# Patient Record
Sex: Female | Born: 1970 | Race: Black or African American | Hispanic: No | Marital: Single | Smoking: Never smoker
Health system: Southern US, Community
[De-identification: ages and names within clinical notes are randomized; demographics above are authoritative.]

## PROBLEM LIST (undated history)

## (undated) DIAGNOSIS — G5610 Other lesions of median nerve, unspecified upper limb: Secondary | ICD-10-CM

## (undated) DIAGNOSIS — I10 Essential (primary) hypertension: Secondary | ICD-10-CM

## (undated) DIAGNOSIS — K08109 Complete loss of teeth, unspecified cause, unspecified class: Secondary | ICD-10-CM

## (undated) DIAGNOSIS — Z89612 Acquired absence of left leg above knee: Secondary | ICD-10-CM

## (undated) DIAGNOSIS — K219 Gastro-esophageal reflux disease without esophagitis: Secondary | ICD-10-CM

## (undated) DIAGNOSIS — N186 End stage renal disease: Secondary | ICD-10-CM

## (undated) DIAGNOSIS — Z7409 Other reduced mobility: Secondary | ICD-10-CM

## (undated) DIAGNOSIS — K000 Anodontia: Secondary | ICD-10-CM

## (undated) DIAGNOSIS — E7800 Pure hypercholesterolemia, unspecified: Secondary | ICD-10-CM

## (undated) DIAGNOSIS — Z872 Personal history of diseases of the skin and subcutaneous tissue: Secondary | ICD-10-CM

## (undated) DIAGNOSIS — I2510 Atherosclerotic heart disease of native coronary artery without angina pectoris: Secondary | ICD-10-CM

## (undated) DIAGNOSIS — Z8679 Personal history of other diseases of the circulatory system: Secondary | ICD-10-CM

## (undated) DIAGNOSIS — Z8614 Personal history of Methicillin resistant Staphylococcus aureus infection: Secondary | ICD-10-CM

## (undated) DIAGNOSIS — Z89611 Acquired absence of right leg above knee: Secondary | ICD-10-CM

## (undated) DIAGNOSIS — I219 Acute myocardial infarction, unspecified: Secondary | ICD-10-CM

## (undated) HISTORY — PX: THROMBECTOMY / ARTERIOVENOUS GRAFT REVISION: SUR1351

## (undated) HISTORY — PX: CENTRAL VENOUS CATHETER INSERTION: SHX401

---

## 1997-10-10 ENCOUNTER — Inpatient Hospital Stay (HOSPITAL_COMMUNITY): Admission: AD | Admit: 1997-10-10 | Discharge: 1997-10-10 | Payer: Self-pay | Admitting: Obstetrics

## 1997-12-12 ENCOUNTER — Encounter: Admission: RE | Admit: 1997-12-12 | Discharge: 1998-03-12 | Payer: Self-pay | Admitting: Obstetrics & Gynecology

## 1997-12-12 ENCOUNTER — Ambulatory Visit (HOSPITAL_COMMUNITY): Admission: RE | Admit: 1997-12-12 | Discharge: 1997-12-12 | Payer: Self-pay

## 1998-01-06 ENCOUNTER — Inpatient Hospital Stay (HOSPITAL_COMMUNITY): Admission: AD | Admit: 1998-01-06 | Discharge: 1998-01-06 | Payer: Self-pay | Admitting: Obstetrics

## 1998-01-07 ENCOUNTER — Encounter (HOSPITAL_COMMUNITY): Admission: RE | Admit: 1998-01-07 | Discharge: 1998-02-11 | Payer: Self-pay | Admitting: Obstetrics & Gynecology

## 1998-01-14 ENCOUNTER — Ambulatory Visit (HOSPITAL_COMMUNITY): Admission: RE | Admit: 1998-01-14 | Discharge: 1998-01-14 | Payer: Self-pay | Admitting: Obstetrics

## 1998-01-19 ENCOUNTER — Inpatient Hospital Stay (HOSPITAL_COMMUNITY): Admission: AD | Admit: 1998-01-19 | Discharge: 1998-01-21 | Payer: Self-pay | Admitting: Obstetrics

## 1998-02-08 ENCOUNTER — Inpatient Hospital Stay (HOSPITAL_COMMUNITY): Admission: AD | Admit: 1998-02-08 | Discharge: 1998-02-11 | Payer: Self-pay | Admitting: Obstetrics & Gynecology

## 1998-10-03 ENCOUNTER — Emergency Department (HOSPITAL_COMMUNITY): Admission: EM | Admit: 1998-10-03 | Discharge: 1998-10-03 | Payer: Self-pay | Admitting: Emergency Medicine

## 1998-10-07 ENCOUNTER — Emergency Department (HOSPITAL_COMMUNITY): Admission: EM | Admit: 1998-10-07 | Discharge: 1998-10-07 | Payer: Self-pay | Admitting: Emergency Medicine

## 1998-10-09 ENCOUNTER — Inpatient Hospital Stay (HOSPITAL_COMMUNITY): Admission: AD | Admit: 1998-10-09 | Discharge: 1998-10-18 | Payer: Self-pay | Admitting: Family Medicine

## 1998-11-14 ENCOUNTER — Inpatient Hospital Stay (HOSPITAL_COMMUNITY): Admission: RE | Admit: 1998-11-14 | Discharge: 1998-11-15 | Payer: Self-pay | Admitting: Orthopedic Surgery

## 1998-11-16 ENCOUNTER — Emergency Department (HOSPITAL_COMMUNITY): Admission: EM | Admit: 1998-11-16 | Discharge: 1998-11-16 | Payer: Self-pay | Admitting: Emergency Medicine

## 1999-10-02 ENCOUNTER — Emergency Department (HOSPITAL_COMMUNITY): Admission: EM | Admit: 1999-10-02 | Discharge: 1999-10-02 | Payer: Self-pay | Admitting: Emergency Medicine

## 1999-10-02 ENCOUNTER — Encounter: Payer: Self-pay | Admitting: Emergency Medicine

## 2001-05-01 ENCOUNTER — Emergency Department (HOSPITAL_COMMUNITY): Admission: EM | Admit: 2001-05-01 | Discharge: 2001-05-01 | Payer: Self-pay | Admitting: Emergency Medicine

## 2002-07-11 ENCOUNTER — Emergency Department (HOSPITAL_COMMUNITY): Admission: EM | Admit: 2002-07-11 | Discharge: 2002-07-11 | Payer: Self-pay | Admitting: Emergency Medicine

## 2002-07-11 ENCOUNTER — Encounter: Payer: Self-pay | Admitting: Emergency Medicine

## 2002-07-13 ENCOUNTER — Ambulatory Visit (HOSPITAL_BASED_OUTPATIENT_CLINIC_OR_DEPARTMENT_OTHER): Admission: RE | Admit: 2002-07-13 | Discharge: 2002-07-13 | Payer: Self-pay | Admitting: Orthopedic Surgery

## 2002-07-13 HISTORY — PX: FINGER EXPLORATION: SHX1635

## 2003-10-16 ENCOUNTER — Emergency Department (HOSPITAL_COMMUNITY): Admission: EM | Admit: 2003-10-16 | Discharge: 2003-10-17 | Payer: Self-pay | Admitting: Emergency Medicine

## 2004-07-15 ENCOUNTER — Inpatient Hospital Stay (HOSPITAL_COMMUNITY): Admission: EM | Admit: 2004-07-15 | Discharge: 2004-07-19 | Payer: Self-pay | Admitting: Emergency Medicine

## 2004-07-17 ENCOUNTER — Encounter (INDEPENDENT_AMBULATORY_CARE_PROVIDER_SITE_OTHER): Payer: Self-pay | Admitting: Specialist

## 2004-07-18 HISTORY — PX: AV FISTULA PLACEMENT: SHX1204

## 2004-07-29 ENCOUNTER — Emergency Department (HOSPITAL_COMMUNITY): Admission: EM | Admit: 2004-07-29 | Discharge: 2004-07-30 | Payer: Self-pay | Admitting: Emergency Medicine

## 2004-09-06 ENCOUNTER — Emergency Department (HOSPITAL_COMMUNITY): Admission: EM | Admit: 2004-09-06 | Discharge: 2004-09-07 | Payer: Self-pay | Admitting: Emergency Medicine

## 2004-09-06 ENCOUNTER — Emergency Department (HOSPITAL_COMMUNITY): Admission: EM | Admit: 2004-09-06 | Discharge: 2004-09-06 | Payer: Self-pay | Admitting: Emergency Medicine

## 2004-09-07 ENCOUNTER — Inpatient Hospital Stay (HOSPITAL_COMMUNITY): Admission: EM | Admit: 2004-09-07 | Discharge: 2004-09-12 | Payer: Self-pay | Admitting: Emergency Medicine

## 2004-09-10 HISTORY — PX: DIALYSIS FISTULA CREATION: SHX611

## 2004-09-19 ENCOUNTER — Ambulatory Visit (HOSPITAL_COMMUNITY): Admission: RE | Admit: 2004-09-19 | Discharge: 2004-09-19 | Payer: Self-pay | Admitting: Vascular Surgery

## 2004-10-08 ENCOUNTER — Ambulatory Visit (HOSPITAL_COMMUNITY): Admission: RE | Admit: 2004-10-08 | Discharge: 2004-10-08 | Payer: Self-pay | Admitting: Nephrology

## 2004-10-20 ENCOUNTER — Ambulatory Visit (HOSPITAL_COMMUNITY): Admission: RE | Admit: 2004-10-20 | Discharge: 2004-10-20 | Payer: Self-pay | Admitting: Vascular Surgery

## 2004-11-16 ENCOUNTER — Emergency Department (HOSPITAL_COMMUNITY): Admission: EM | Admit: 2004-11-16 | Discharge: 2004-11-16 | Payer: Self-pay | Admitting: *Deleted

## 2005-02-22 ENCOUNTER — Emergency Department (HOSPITAL_COMMUNITY): Admission: EM | Admit: 2005-02-22 | Discharge: 2005-02-22 | Payer: Self-pay | Admitting: Emergency Medicine

## 2005-03-11 ENCOUNTER — Ambulatory Visit (HOSPITAL_COMMUNITY): Admission: RE | Admit: 2005-03-11 | Discharge: 2005-03-11 | Payer: Self-pay | Admitting: Vascular Surgery

## 2005-03-20 ENCOUNTER — Emergency Department (HOSPITAL_COMMUNITY): Admission: EM | Admit: 2005-03-20 | Discharge: 2005-03-21 | Payer: Self-pay | Admitting: Emergency Medicine

## 2005-04-03 ENCOUNTER — Encounter: Admission: RE | Admit: 2005-04-03 | Discharge: 2005-04-03 | Payer: Self-pay | Admitting: Nephrology

## 2005-06-18 ENCOUNTER — Ambulatory Visit (HOSPITAL_COMMUNITY): Admission: RE | Admit: 2005-06-18 | Discharge: 2005-06-18 | Payer: Self-pay | Admitting: Nephrology

## 2005-06-19 ENCOUNTER — Ambulatory Visit (HOSPITAL_COMMUNITY): Admission: RE | Admit: 2005-06-19 | Discharge: 2005-06-19 | Payer: Self-pay | Admitting: *Deleted

## 2005-07-09 ENCOUNTER — Inpatient Hospital Stay (HOSPITAL_COMMUNITY): Admission: RE | Admit: 2005-07-09 | Discharge: 2005-07-10 | Payer: Self-pay | Admitting: Vascular Surgery

## 2005-07-14 HISTORY — PX: AV FISTULA REPAIR: SHX563

## 2005-09-03 ENCOUNTER — Ambulatory Visit (HOSPITAL_COMMUNITY): Admission: RE | Admit: 2005-09-03 | Discharge: 2005-09-03 | Payer: Self-pay | Admitting: Vascular Surgery

## 2005-10-04 HISTORY — PX: DIALYSIS FISTULA CREATION: SHX611

## 2005-10-06 ENCOUNTER — Ambulatory Visit (HOSPITAL_COMMUNITY): Admission: RE | Admit: 2005-10-06 | Discharge: 2005-10-06 | Payer: Self-pay | Admitting: Vascular Surgery

## 2005-10-26 ENCOUNTER — Ambulatory Visit (HOSPITAL_COMMUNITY): Admission: RE | Admit: 2005-10-26 | Discharge: 2005-10-26 | Payer: Self-pay | Admitting: *Deleted

## 2005-10-26 HISTORY — PX: THROMBECTOMY / ARTERIOVENOUS GRAFT REVISION: SUR1351

## 2005-12-07 ENCOUNTER — Inpatient Hospital Stay (HOSPITAL_COMMUNITY): Admission: EM | Admit: 2005-12-07 | Discharge: 2005-12-08 | Payer: Self-pay | Admitting: Emergency Medicine

## 2005-12-16 ENCOUNTER — Ambulatory Visit (HOSPITAL_COMMUNITY): Admission: RE | Admit: 2005-12-16 | Discharge: 2005-12-16 | Payer: Self-pay | Admitting: *Deleted

## 2006-01-18 ENCOUNTER — Ambulatory Visit (HOSPITAL_COMMUNITY): Admission: RE | Admit: 2006-01-18 | Discharge: 2006-01-18 | Payer: Self-pay | Admitting: Vascular Surgery

## 2006-01-19 ENCOUNTER — Ambulatory Visit (HOSPITAL_COMMUNITY): Admission: RE | Admit: 2006-01-19 | Discharge: 2006-01-19 | Payer: Self-pay | Admitting: Vascular Surgery

## 2006-01-28 ENCOUNTER — Ambulatory Visit (HOSPITAL_COMMUNITY): Admission: RE | Admit: 2006-01-28 | Discharge: 2006-01-28 | Payer: Self-pay | Admitting: Vascular Surgery

## 2006-01-28 HISTORY — PX: DIALYSIS FISTULA CREATION: SHX611

## 2006-03-23 ENCOUNTER — Emergency Department (HOSPITAL_COMMUNITY): Admission: EM | Admit: 2006-03-23 | Discharge: 2006-03-23 | Payer: Self-pay | Admitting: Emergency Medicine

## 2006-03-23 ENCOUNTER — Ambulatory Visit (HOSPITAL_COMMUNITY): Admission: RE | Admit: 2006-03-23 | Discharge: 2006-03-23 | Payer: Self-pay | Admitting: Vascular Surgery

## 2006-12-29 ENCOUNTER — Ambulatory Visit (HOSPITAL_COMMUNITY): Admission: RE | Admit: 2006-12-29 | Discharge: 2006-12-29 | Payer: Self-pay | Admitting: Nephrology

## 2006-12-31 ENCOUNTER — Ambulatory Visit (HOSPITAL_COMMUNITY): Admission: RE | Admit: 2006-12-31 | Discharge: 2006-12-31 | Payer: Self-pay | Admitting: Vascular Surgery

## 2006-12-31 ENCOUNTER — Ambulatory Visit: Payer: Self-pay | Admitting: Vascular Surgery

## 2007-01-13 ENCOUNTER — Ambulatory Visit: Payer: Self-pay | Admitting: *Deleted

## 2007-01-20 ENCOUNTER — Ambulatory Visit (HOSPITAL_COMMUNITY): Admission: RE | Admit: 2007-01-20 | Discharge: 2007-01-20 | Payer: Self-pay | Admitting: Vascular Surgery

## 2007-01-20 HISTORY — PX: DIALYSIS FISTULA CREATION: SHX611

## 2007-03-06 ENCOUNTER — Emergency Department (HOSPITAL_COMMUNITY): Admission: EM | Admit: 2007-03-06 | Discharge: 2007-03-06 | Payer: Self-pay | Admitting: Emergency Medicine

## 2007-03-08 ENCOUNTER — Ambulatory Visit (HOSPITAL_COMMUNITY): Admission: RE | Admit: 2007-03-08 | Discharge: 2007-03-08 | Payer: Self-pay | Admitting: Vascular Surgery

## 2007-03-08 ENCOUNTER — Ambulatory Visit: Payer: Self-pay | Admitting: Vascular Surgery

## 2007-03-25 ENCOUNTER — Emergency Department (HOSPITAL_COMMUNITY): Admission: EM | Admit: 2007-03-25 | Discharge: 2007-03-25 | Payer: Self-pay | Admitting: Emergency Medicine

## 2007-04-20 ENCOUNTER — Emergency Department (HOSPITAL_COMMUNITY): Admission: EM | Admit: 2007-04-20 | Discharge: 2007-04-20 | Payer: Self-pay | Admitting: Emergency Medicine

## 2007-04-28 ENCOUNTER — Ambulatory Visit (HOSPITAL_COMMUNITY): Admission: RE | Admit: 2007-04-28 | Discharge: 2007-04-28 | Payer: Self-pay | Admitting: Nephrology

## 2007-05-11 ENCOUNTER — Ambulatory Visit (HOSPITAL_COMMUNITY): Admission: RE | Admit: 2007-05-11 | Discharge: 2007-05-11 | Payer: Self-pay | Admitting: Vascular Surgery

## 2007-05-11 ENCOUNTER — Ambulatory Visit: Payer: Self-pay | Admitting: Vascular Surgery

## 2007-06-14 ENCOUNTER — Encounter: Admission: RE | Admit: 2007-06-14 | Discharge: 2007-06-14 | Payer: Self-pay | Admitting: Nephrology

## 2007-10-19 ENCOUNTER — Emergency Department (HOSPITAL_COMMUNITY): Admission: EM | Admit: 2007-10-19 | Discharge: 2007-10-19 | Payer: Self-pay | Admitting: Emergency Medicine

## 2008-06-20 ENCOUNTER — Inpatient Hospital Stay (HOSPITAL_COMMUNITY): Admission: EM | Admit: 2008-06-20 | Discharge: 2008-06-21 | Payer: Self-pay | Admitting: Emergency Medicine

## 2008-07-08 ENCOUNTER — Inpatient Hospital Stay (HOSPITAL_COMMUNITY): Admission: EM | Admit: 2008-07-08 | Discharge: 2008-07-12 | Payer: Self-pay | Admitting: Emergency Medicine

## 2008-07-08 ENCOUNTER — Ambulatory Visit: Payer: Self-pay | Admitting: *Deleted

## 2008-07-08 HISTORY — PX: AV FISTULA REPAIR: SHX563

## 2008-07-24 ENCOUNTER — Ambulatory Visit: Payer: Self-pay | Admitting: *Deleted

## 2008-08-02 ENCOUNTER — Ambulatory Visit: Payer: Self-pay | Admitting: *Deleted

## 2008-08-23 ENCOUNTER — Emergency Department (HOSPITAL_COMMUNITY): Admission: EM | Admit: 2008-08-23 | Discharge: 2008-08-23 | Payer: Self-pay | Admitting: Emergency Medicine

## 2008-09-10 ENCOUNTER — Emergency Department (HOSPITAL_COMMUNITY): Admission: EM | Admit: 2008-09-10 | Discharge: 2008-09-10 | Payer: Self-pay | Admitting: Emergency Medicine

## 2008-09-27 ENCOUNTER — Ambulatory Visit: Payer: Self-pay | Admitting: *Deleted

## 2008-09-30 ENCOUNTER — Inpatient Hospital Stay (HOSPITAL_COMMUNITY): Admission: EM | Admit: 2008-09-30 | Discharge: 2008-10-02 | Payer: Self-pay | Admitting: Emergency Medicine

## 2008-10-18 ENCOUNTER — Ambulatory Visit: Payer: Self-pay | Admitting: Vascular Surgery

## 2008-10-18 ENCOUNTER — Observation Stay (HOSPITAL_COMMUNITY): Admission: RE | Admit: 2008-10-18 | Discharge: 2008-10-19 | Payer: Self-pay | Admitting: Vascular Surgery

## 2008-10-18 HISTORY — PX: AV FISTULA PLACEMENT: SHX1204

## 2008-10-22 ENCOUNTER — Ambulatory Visit: Payer: Self-pay | Admitting: Surgery

## 2008-11-06 ENCOUNTER — Inpatient Hospital Stay (HOSPITAL_COMMUNITY): Admission: EM | Admit: 2008-11-06 | Discharge: 2008-11-24 | Payer: Self-pay | Admitting: Emergency Medicine

## 2008-11-06 HISTORY — PX: FEMORAL ENDARTERECTOMY: SUR606

## 2008-11-16 HISTORY — PX: GROIN DEBRIDEMENT: SHX5159

## 2008-12-03 ENCOUNTER — Observation Stay (HOSPITAL_COMMUNITY): Admission: EM | Admit: 2008-12-03 | Discharge: 2008-12-03 | Payer: Self-pay | Admitting: Emergency Medicine

## 2009-01-07 ENCOUNTER — Emergency Department (HOSPITAL_COMMUNITY): Admission: EM | Admit: 2009-01-07 | Discharge: 2009-01-07 | Payer: Self-pay | Admitting: Emergency Medicine

## 2009-01-24 ENCOUNTER — Ambulatory Visit (HOSPITAL_COMMUNITY): Admission: RE | Admit: 2009-01-24 | Discharge: 2009-01-24 | Payer: Self-pay | Admitting: Nephrology

## 2009-02-13 ENCOUNTER — Encounter (HOSPITAL_BASED_OUTPATIENT_CLINIC_OR_DEPARTMENT_OTHER): Admission: RE | Admit: 2009-02-13 | Discharge: 2009-05-14 | Payer: Self-pay | Admitting: Internal Medicine

## 2009-02-23 ENCOUNTER — Inpatient Hospital Stay (HOSPITAL_COMMUNITY): Admission: EM | Admit: 2009-02-23 | Discharge: 2009-03-11 | Payer: Self-pay | Admitting: Emergency Medicine

## 2009-02-23 ENCOUNTER — Ambulatory Visit: Payer: Self-pay | Admitting: Critical Care Medicine

## 2009-02-25 ENCOUNTER — Ambulatory Visit: Payer: Self-pay | Admitting: Vascular Surgery

## 2009-03-04 HISTORY — PX: EXCISION / CURETTAGE BONE CYST TALUS / CALCANEUS: SUR480

## 2009-03-08 ENCOUNTER — Ambulatory Visit: Payer: Self-pay | Admitting: Infectious Diseases

## 2009-03-11 ENCOUNTER — Ambulatory Visit: Payer: Self-pay | Admitting: Dentistry

## 2009-03-19 ENCOUNTER — Ambulatory Visit (HOSPITAL_COMMUNITY): Admission: RE | Admit: 2009-03-19 | Discharge: 2009-03-19 | Payer: Self-pay | Admitting: Nephrology

## 2009-04-02 ENCOUNTER — Ambulatory Visit (HOSPITAL_COMMUNITY): Admission: RE | Admit: 2009-04-02 | Discharge: 2009-04-02 | Payer: Self-pay | Admitting: Nephrology

## 2009-04-04 ENCOUNTER — Inpatient Hospital Stay (HOSPITAL_COMMUNITY): Admission: RE | Admit: 2009-04-04 | Discharge: 2009-04-15 | Payer: Self-pay | Admitting: Orthopedic Surgery

## 2009-04-04 ENCOUNTER — Encounter (INDEPENDENT_AMBULATORY_CARE_PROVIDER_SITE_OTHER): Payer: Self-pay | Admitting: Orthopedic Surgery

## 2009-04-04 HISTORY — PX: FOOT AMPUTATION: SHX951

## 2009-04-09 ENCOUNTER — Ambulatory Visit: Payer: Self-pay | Admitting: Pulmonary Disease

## 2009-04-12 ENCOUNTER — Ambulatory Visit: Payer: Self-pay | Admitting: Physical Medicine & Rehabilitation

## 2009-05-14 ENCOUNTER — Ambulatory Visit (HOSPITAL_COMMUNITY): Admission: RE | Admit: 2009-05-14 | Discharge: 2009-05-14 | Payer: Self-pay | Admitting: Nephrology

## 2009-05-21 ENCOUNTER — Inpatient Hospital Stay (HOSPITAL_COMMUNITY): Admission: RE | Admit: 2009-05-21 | Discharge: 2009-05-23 | Payer: Self-pay | Admitting: Orthopedic Surgery

## 2009-05-21 ENCOUNTER — Encounter (INDEPENDENT_AMBULATORY_CARE_PROVIDER_SITE_OTHER): Payer: Self-pay | Admitting: Orthopedic Surgery

## 2009-05-21 HISTORY — PX: ABOVE KNEE LEG AMPUTATION: SUR20

## 2009-05-31 ENCOUNTER — Ambulatory Visit (HOSPITAL_COMMUNITY): Admission: RE | Admit: 2009-05-31 | Discharge: 2009-05-31 | Payer: Self-pay | Admitting: Nephrology

## 2009-06-29 ENCOUNTER — Emergency Department (HOSPITAL_COMMUNITY): Admission: EM | Admit: 2009-06-29 | Discharge: 2009-06-30 | Payer: Self-pay | Admitting: Emergency Medicine

## 2009-07-01 ENCOUNTER — Observation Stay (HOSPITAL_COMMUNITY): Admission: EM | Admit: 2009-07-01 | Discharge: 2009-07-01 | Payer: Self-pay | Admitting: Emergency Medicine

## 2009-07-30 ENCOUNTER — Ambulatory Visit: Payer: Self-pay | Admitting: Vascular Surgery

## 2009-08-06 ENCOUNTER — Other Ambulatory Visit: Payer: Self-pay | Admitting: Nephrology

## 2009-08-06 ENCOUNTER — Ambulatory Visit (HOSPITAL_COMMUNITY): Admission: RE | Admit: 2009-08-06 | Discharge: 2009-08-06 | Payer: Self-pay | Admitting: Vascular Surgery

## 2009-08-06 ENCOUNTER — Ambulatory Visit: Payer: Self-pay | Admitting: Vascular Surgery

## 2009-08-06 HISTORY — PX: CENTRAL VENOUS CATHETER INSERTION: SHX401

## 2009-09-12 ENCOUNTER — Ambulatory Visit (HOSPITAL_COMMUNITY): Admission: RE | Admit: 2009-09-12 | Discharge: 2009-09-12 | Payer: Self-pay | Admitting: Nephrology

## 2009-09-17 ENCOUNTER — Ambulatory Visit: Payer: Self-pay | Admitting: Vascular Surgery

## 2009-09-19 ENCOUNTER — Ambulatory Visit (HOSPITAL_COMMUNITY): Admission: RE | Admit: 2009-09-19 | Discharge: 2009-09-19 | Payer: Self-pay | Admitting: Nephrology

## 2009-10-09 ENCOUNTER — Inpatient Hospital Stay (HOSPITAL_COMMUNITY): Admission: EM | Admit: 2009-10-09 | Discharge: 2009-10-23 | Payer: Self-pay | Admitting: Emergency Medicine

## 2009-10-11 ENCOUNTER — Encounter (INDEPENDENT_AMBULATORY_CARE_PROVIDER_SITE_OTHER): Payer: Self-pay | Admitting: Internal Medicine

## 2009-10-11 HISTORY — PX: LEG AMPUTATION BELOW KNEE: SHX694

## 2009-10-25 ENCOUNTER — Ambulatory Visit (HOSPITAL_COMMUNITY): Admission: RE | Admit: 2009-10-25 | Discharge: 2009-10-25 | Payer: Self-pay | Admitting: Nephrology

## 2009-11-08 ENCOUNTER — Inpatient Hospital Stay (HOSPITAL_COMMUNITY): Admission: RE | Admit: 2009-11-08 | Discharge: 2009-11-08 | Payer: Self-pay | Admitting: Orthopedic Surgery

## 2009-11-08 ENCOUNTER — Encounter (INDEPENDENT_AMBULATORY_CARE_PROVIDER_SITE_OTHER): Payer: Self-pay | Admitting: Orthopedic Surgery

## 2009-11-08 HISTORY — PX: ABOVE KNEE LEG AMPUTATION: SUR20

## 2010-01-07 ENCOUNTER — Ambulatory Visit: Payer: Self-pay | Admitting: Vascular Surgery

## 2010-01-14 ENCOUNTER — Inpatient Hospital Stay (HOSPITAL_COMMUNITY): Admission: RE | Admit: 2010-01-14 | Discharge: 2010-01-15 | Payer: Self-pay | Admitting: Vascular Surgery

## 2010-01-14 ENCOUNTER — Ambulatory Visit: Payer: Self-pay | Admitting: Vascular Surgery

## 2010-01-14 HISTORY — PX: THROMBECTOMY / ARTERIOVENOUS GRAFT REVISION: SUR1351

## 2010-02-20 ENCOUNTER — Ambulatory Visit: Payer: Self-pay | Admitting: Vascular Surgery

## 2010-03-04 ENCOUNTER — Ambulatory Visit: Payer: Self-pay | Admitting: Surgery

## 2010-03-04 ENCOUNTER — Ambulatory Visit (HOSPITAL_COMMUNITY): Admission: RE | Admit: 2010-03-04 | Discharge: 2010-03-04 | Payer: Self-pay | Admitting: Vascular Surgery

## 2010-03-16 IMAGING — CT CT PELVIS W/ CM
2 of 4 series · 17 of 46 positions shown, 19 images · IV contrast (omnipaque)
Comparison: CT 04/09/2009

CT ABDOMEN

CLINICAL DATA: Abdominal pain, vomiting.  Renal failure.

CT ABDOMEN AND PELVIS WITH CONTRAST
TECHNIQUE: Multidetector CT imaging of the abdomen and pelvis was
performed using the standard protocol following bolus
administration of intravenous contrast.
Contrast: 100 ml Omnipaque 300 IV.

[Series 2: routine abdomen · axial · 0.70mm/px · z∈[-431,-16]mm · 14 of 88 slices shown, 16 images]
[im 4/88  soft-tissue]
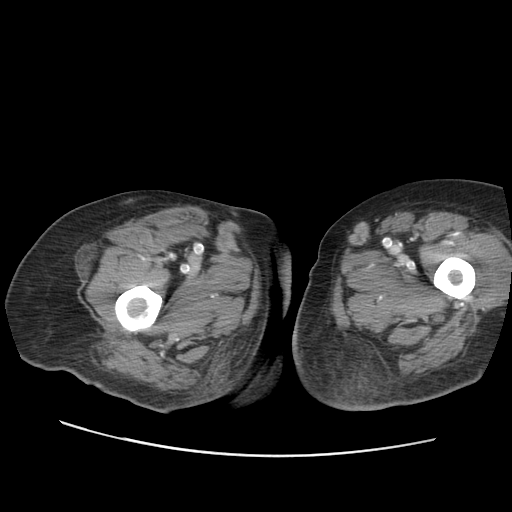
[im 4/88  bone]
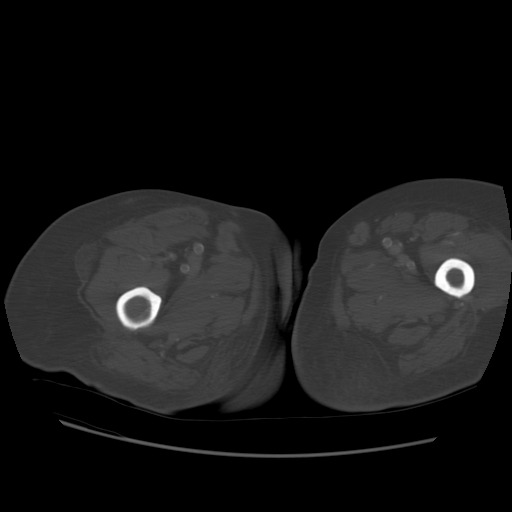
[im 11/88  soft-tissue]
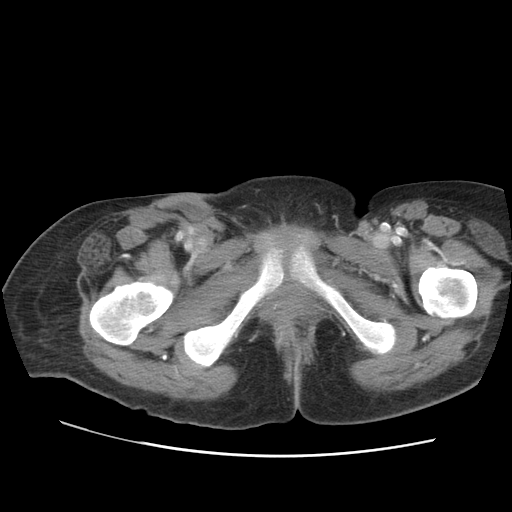
[im 18/88  soft-tissue]
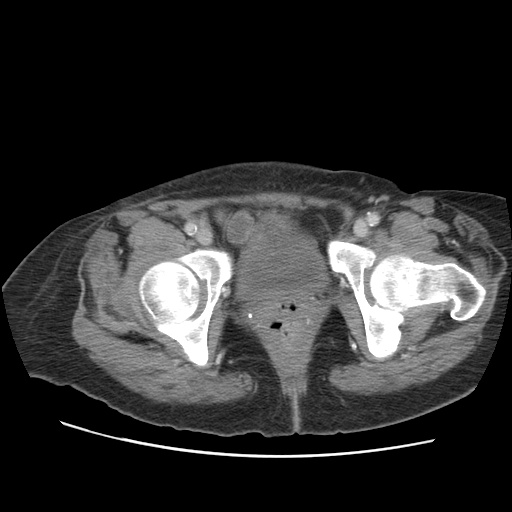
[im 25/88  soft-tissue]
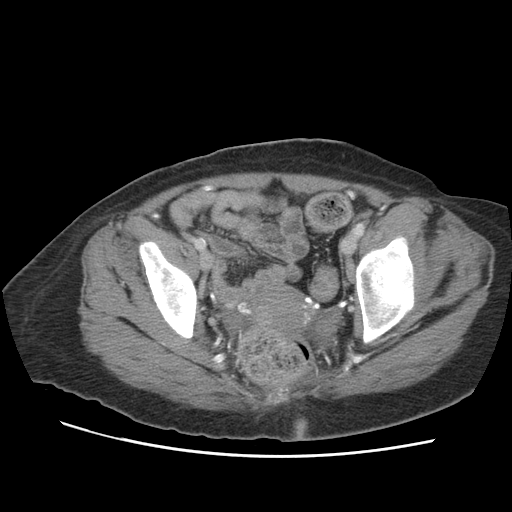
[im 28/88  soft-tissue]
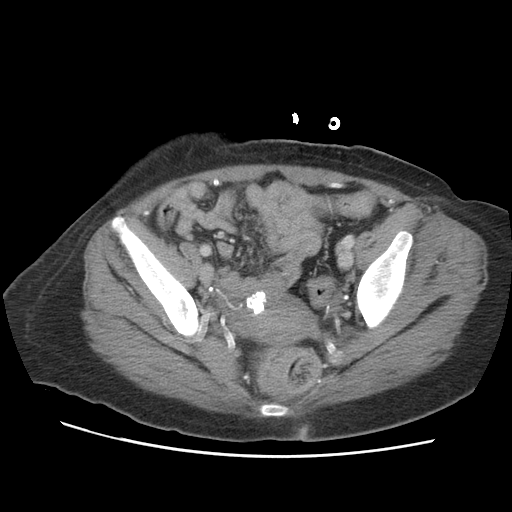
[im 35/88  soft-tissue]
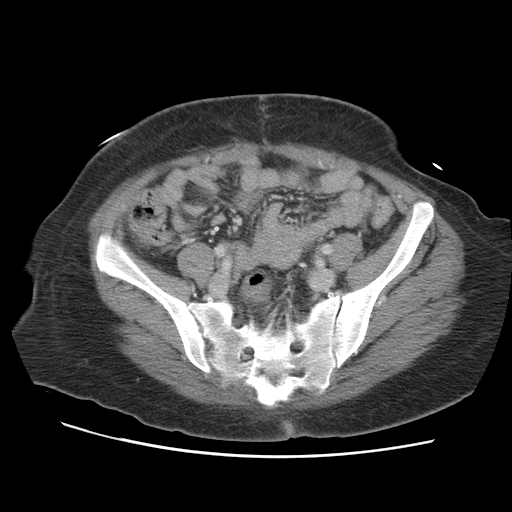
[im 42/88  soft-tissue]
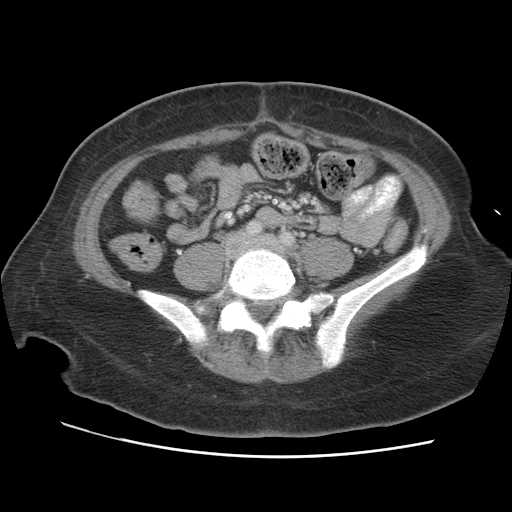
[im 46/88  soft-tissue]
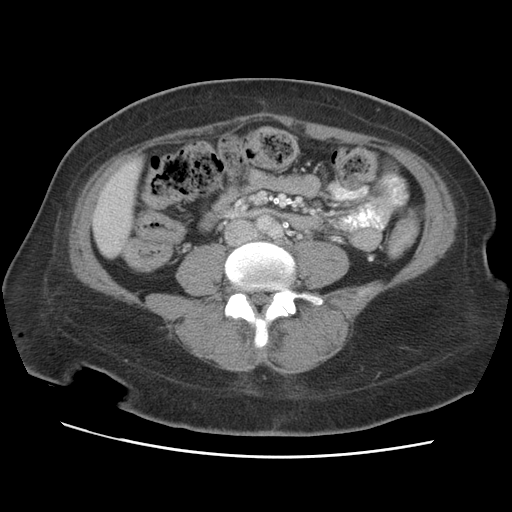
[im 53/88  soft-tissue]
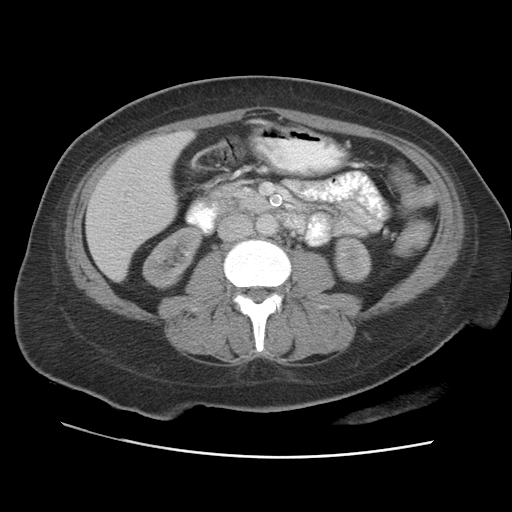
[im 53/88  bone]
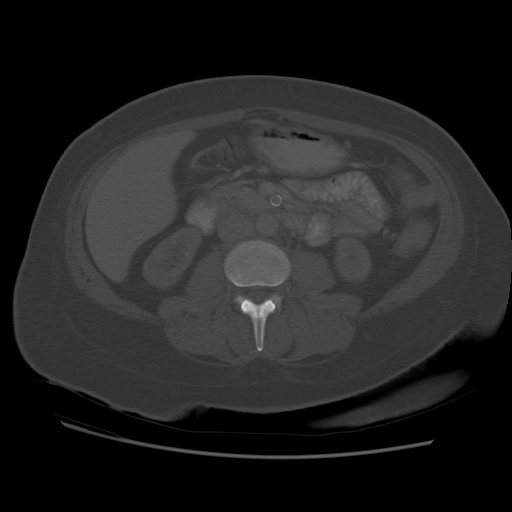
[im 60/88  soft-tissue]
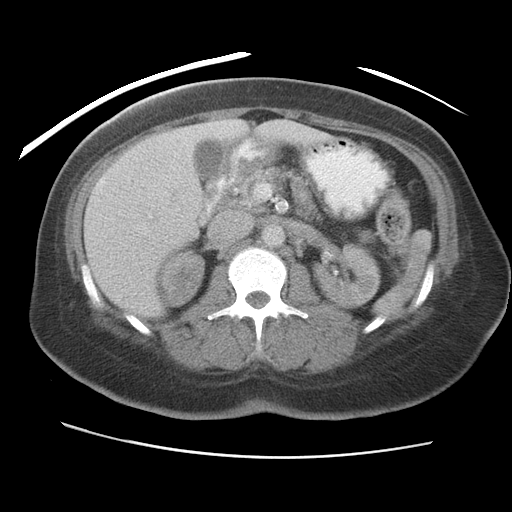
[im 67/88  soft-tissue]
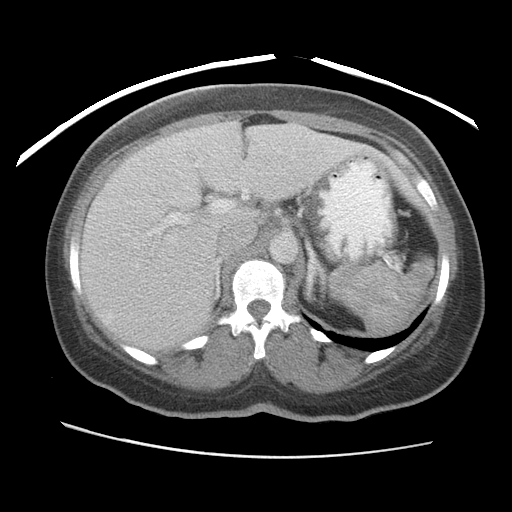
[im 70/88  soft-tissue]
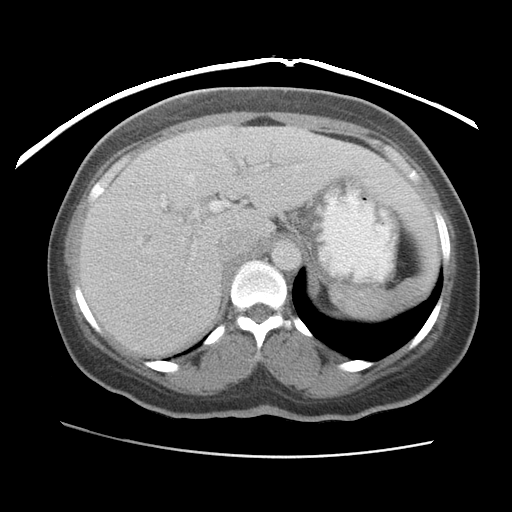
[im 77/88  soft-tissue]
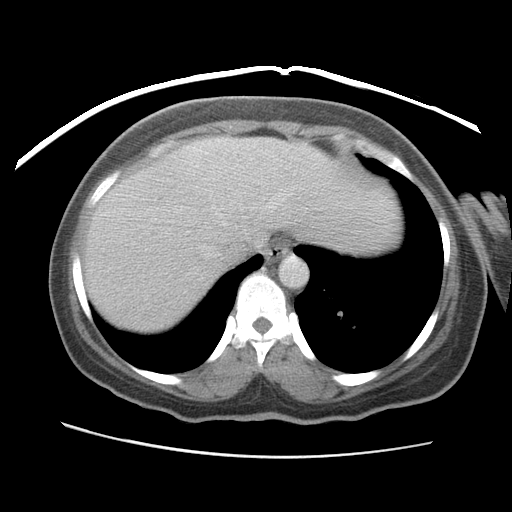
[im 84/88  soft-tissue]
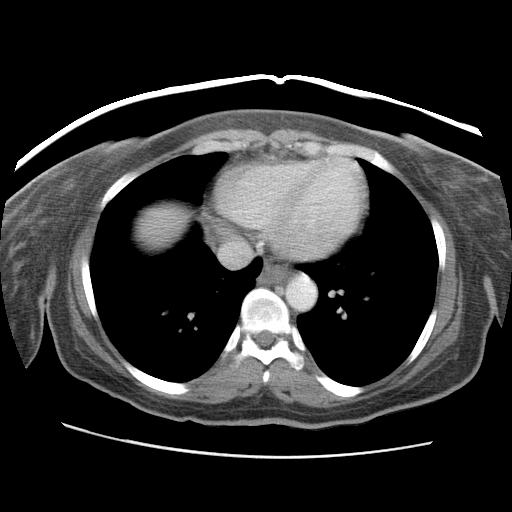

[Series 401: cor · coronal · 0.90mm/px · 3 of 93 slices shown]
[im 31/93  soft-tissue]
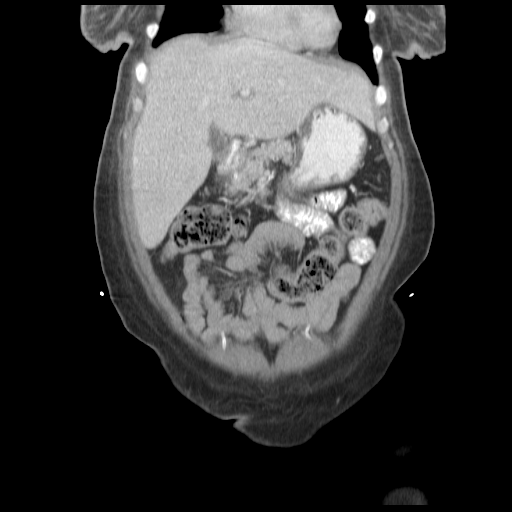
[im 41/93  soft-tissue]
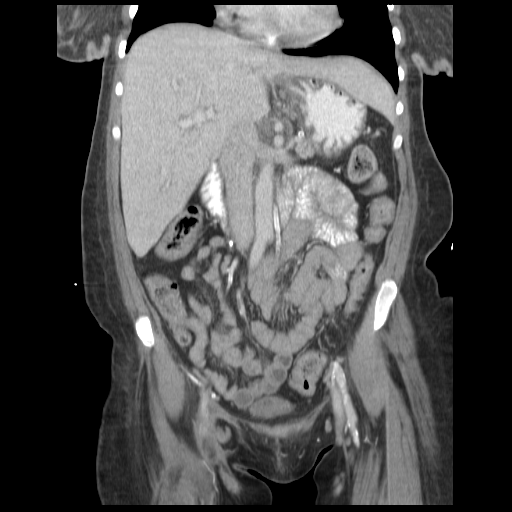
[im 52/93  soft-tissue]
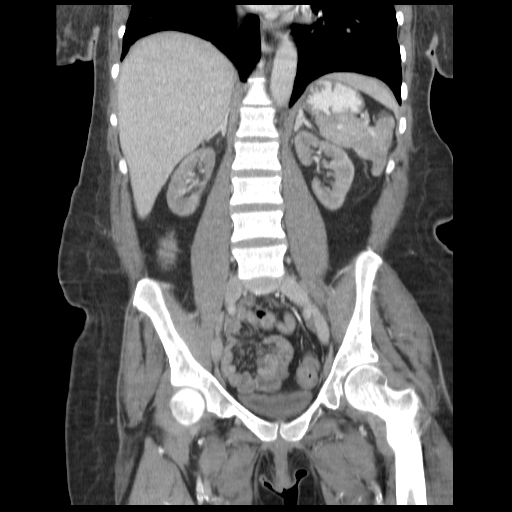

[17 of 46 positions shown; findings below may reference images not displayed]

FINDINGS: Kidneys are atrophic and poorly enhancing compatible
with the patient's history of renal failure.  There are heavily
calcified vessels throughout the abdomen, unchanged.

Liver, spleen, pancreas, adrenals unremarkable.  Gallbladder
unremarkable.

Moderate stool throughout the colon.  Small bowel grossly
unremarkable.  No free fluid, free air or adenopathy.  Aorta is
normal caliber.

Minimal right base atelectasis.  No effusions.
IMPRESSION: Stable chronic changes as above.

No acute findings in the abdomen.

CT PELVIS
FINDINGS: Calcified fibroid noted within the uterus.  Adnexa
unremarkable.  Moderate stool in the colon.  Appendix is visualized
and is normal.  Pelvic small bowel grossly unremarkable.  No free
fluid, free air or adenopathy.

Again noted is a healing of the right inguinal wound without fluid
collection.  No change. Heavily calcified vessels throughout the
pelvis.
IMPRESSION: No acute findings in the pelvis.

## 2010-03-25 ENCOUNTER — Encounter: Admission: RE | Admit: 2010-03-25 | Discharge: 2010-03-27 | Payer: Self-pay | Admitting: Nephrology

## 2010-05-27 ENCOUNTER — Ambulatory Visit (HOSPITAL_BASED_OUTPATIENT_CLINIC_OR_DEPARTMENT_OTHER): Admission: RE | Admit: 2010-05-27 | Discharge: 2010-05-27 | Payer: Self-pay | Admitting: Orthopedic Surgery

## 2010-05-27 HISTORY — PX: FINGER AMPUTATION: SHX636

## 2010-06-18 ENCOUNTER — Other Ambulatory Visit: Payer: Self-pay | Admitting: Orthopedic Surgery

## 2010-06-18 ENCOUNTER — Ambulatory Visit: Payer: Self-pay | Admitting: Infectious Diseases

## 2010-06-18 ENCOUNTER — Inpatient Hospital Stay (HOSPITAL_COMMUNITY): Admission: EM | Admit: 2010-06-18 | Discharge: 2010-06-23 | Payer: Self-pay | Admitting: Internal Medicine

## 2010-06-20 HISTORY — PX: FINGER DEBRIDEMENT: SHX1634

## 2010-06-26 ENCOUNTER — Encounter: Admission: RE | Admit: 2010-06-26 | Discharge: 2010-07-14 | Payer: Self-pay | Admitting: Orthopedic Surgery

## 2010-09-18 ENCOUNTER — Emergency Department (HOSPITAL_COMMUNITY)
Admission: EM | Admit: 2010-09-18 | Discharge: 2010-09-18 | Payer: Self-pay | Source: Home / Self Care | Admitting: Emergency Medicine

## 2010-09-20 ENCOUNTER — Encounter: Payer: Self-pay | Admitting: Nephrology

## 2010-09-21 ENCOUNTER — Encounter: Payer: Self-pay | Admitting: Nephrology

## 2010-09-22 ENCOUNTER — Encounter (HOSPITAL_BASED_OUTPATIENT_CLINIC_OR_DEPARTMENT_OTHER): Payer: Self-pay | Admitting: Internal Medicine

## 2010-10-20 NOTE — Op Note (Signed)
[name], [name]               ACCOUNT NO.:  [id number]  MEDICAL RECORD NO.:  [id number]          PATIENT TYPE:  AMB  LOCATION:  DSC                          FACILITY:  [facility]  PHYSICIAN:  Cindee Salt, M.D.       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  OPERATION:  Incision and drainage, debridement and irrigation of flexor sheath, collar button abscess, right middle finger.  SURGEON:  Cindee Salt, MD  ASSISTANT:  Carolyne Fiscal.  ANESTHESIA:  General.  HISTORY:  The patient is a 40 year old female with a history of renal failure on dialysis with a history of an open area on her right middle fingernail with osteomyelitis of the distal phalanx.  She underwent amputation to the tip of the right middle finger approximately 2 weeks ago with amputation through the middle phalanx.  She was seen 4 days ago with a small amount of drainage at the tip.  This was opened.  She was placed on Septra.  She returned to the office with swelling of her entire finger.  She did not have any pain in the palm of her hand.  She was scheduled for incision and drainage, possible flexor sheath infection.  PROCEDURE IN DETAIL:  The patient was seen in the preoperative area. The extremity marked by both the patient and surgeon.  Precautions taken for infection.  Questions encouraged and answered.  The patient was brought to the operating room where general anesthetic was carried out without difficulty.  She was  prepped using Betadine scrub and solution with the right arm free.  A tourniquet on the forearm and was inflated to 250 mmHg after elevation for exsanguination.  A mid lateral incision was made.  Pus was immediately encountered in the finger.  The dissection was carried proximally.  It appeared that the entire finger was infected to the flexor sheath.  This was followed down to the level of the mid palm where the purulence terminated.  The carpal tunnel was not infected.  The area was thoroughly debrided.  A tenosynovectomy performed to the flexor tendons.  These appeared to be vascular at the present time.  The flexor sheath was left intact.  This was copiously irrigated with saline.  An incision was then made on the dorsum of the finger.  Pus was immediately encountered on the dorsum of the finger.  This was debrided.  An infant feeding tube was passed down the flexor sheath after debriding and the distal margin of it where the volar plate was present.  The flexor sheath was thoroughly irrigated with saline.  Approximately 2 liters was used.  The entire finger was left open.  Neurovascular bundles were maintained.  The entire area was  copiously irrigated with saline after cultures were taken for both aerobic and anaerobic cultures.  The wound was packed open.  A sterile compressive dressing and splint to the wrist was applied.  The patient tolerated the procedure well and was taken to the recovery room for observation in satisfactory condition.          ______________________________ Cindee Salt, M.D.     GK/MEDQ  D:  06/18/2010  T:  06/19/2010  Job:  [id number]  cc:   Dr. Darrick Penna  Electronically Signed by Cindee Salt M.D. on 10/20/2010 12:09:50 PM

## 2010-10-20 NOTE — Op Note (Signed)
[name], Wang               ACCOUNT NO.:  [id number]  MEDICAL RECORD NO.:  [id number]          PATIENT TYPE:  AMB  LOCATION:  DSC                          FACILITY:  [facility]  PHYSICIAN:  Cindee Salt, M.D.       DATE OF BIRTH:  07-27-1971  DATE OF PROCEDURE: DATE OF DISCHARGE:                              OPERATIVE REPORT   PREOPERATIVE DIAGNOSIS:  Gangrene, right middle finger.  POSTOPERATIVE DIAGNOSIS:  Gangrene, right middle finger.  OPERATION:  Amputation of middle phalanx, right middle finger.  SURGEON:  Cindee Salt, MD  ANESTHESIA:  General.  DATE OF OPERATION:  May 27, 2010  ANESTHESIOLOGIST:  Jean Rosenthal.  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  She has a totally open area with osteomyelitis of the distal phalanx.  She is admitted now for disarticulation, amputation through the distal aspect of the middle phalanx, right middle finger.  She is aware of risks and complications including infection, recurrence of injury to arteries, nerves, tendons, incomplete relief of symptoms, dystrophy, the possibility of further amputation due to vascular impairment.  Preoperative area, the patient is seen.  The extremity marked by both the patient and surgeon.  Antibiotic given.  PROCEDURE:  The patient was brought to the operating room where a general anesthetic was carried out without difficulty.  She was prepped using Betadine scrub and solution with the right arm free.  A 3-minute dry time was allowed.  Time-out was taken confirming the patient procedure.  Following adequate anesthesia, the limb was exsanguinated with an Esmarch bandage.  Tourniquet placed on forearm was inflated to 250 mmHg.  A dorsal volar incision flaps were made, carried down through subcutaneous tissue.  She did show bleeding of her skin.  Following the incisions, the dissection was carried  down through the flexor tendon. The neurovascular bundles were identified, protected, clamped.  The extensor tendon was incised.  The flexor tendon immediately pulled off from the distal phalanx.  This was further debrided and allowed to retract.  The amputation was completed after cutting the collateral ligaments.  This specimen was sent to pathology.  It was not further opened.  The cartilage off from the middle phalanx was then removed. The condyle was shaped with a rongeur.  The wound was copiously irrigated with saline.  The arteries were isolated.  These were tied with a 4-0 Vicryl suture.  The nerves were placed under traction, cut and allowed to retract back onto the skin both radially and ulnarly. The skin was then closed with interrupted 5-0 Vicryl Rapide sutures.  A metacarpal block was given with 0.25% Marcaine without epinephrine.  A sterile compressive dressing and splint to the digit was applied.  On deflation of the tourniquet, remaining fingers were pink.  She was taken to the recovery room for observation in satisfactory condition.  She will be discharged home to return to the [facility] [place] in 1 week on Percocet.          ______________________________ Cindee Salt, M.D.  GK/MEDQ  D:  05/27/2010  T:  05/27/2010  Job:  [id number]  cc:   Fayrene Fearing L. Deterding, M.D.  Electronically Signed by Cindee Salt M.D. on 10/20/2010 12:09:41 PM

## 2010-10-20 NOTE — Op Note (Signed)
  NAMEGLENNIE, BOSE               ACCOUNT NO.:  [id number]  MEDICAL RECORD NO.:  [id number]          PATIENT TYPE:  AMB  LOCATION:  DSC                          FACILITY:  [facility]  PHYSICIAN:  Cindee Salt, M.D.       DATE OF BIRTH:  29-May-1971  DATE OF PROCEDURE:  06/20/2010 DATE OF DISCHARGE:  06/18/2010                              OPERATIVE REPORT   PREOPERATIVE DIAGNOSIS:  Diabetes with hemodialysis gangrene, osteomyelitis, right middle finger with infection of flexor sheath.  POSTOPERATIVE DIAGNOSIS:  Diabetes with hemodialysis gangrene, osteomyelitis, right middle finger with infection of flexor sheath.  OPERATION:  Dressing changes with debridement, right middle finger, sharp.  SURGEON:  Cindee Salt, MD  ASSISTANT:  Joaquin Courts, RN  ANESTHESIA:  General.  ANESTHESIOLOGIST:  Sheldon Silvan, MD  HISTORY:  The patient is a 40 year old female on renal dialysis with diabetes who underwent amputation of her right middle finger for osteomyelitis of the distal phalanx.  The amputation was through the middle phalanx.  She subsequently developed a flexor sheath infection and underwent incision and drainage back to the palm.  She is admitted now for dressing change and further debridement.  PROCEDURE IN DETAIL:  The patient was brought to the operating room where a general anesthetic was carried out without difficulty.  She was prepped using Betadine scrub and solution with the right arm free.  No tourniquet was used.  A time-out was taken confirming the patient and procedure.  The dressing was removed, the packing removed, and bleeding was present over the entire digit.  The finger showed no purulent material with sharp dissection using scissors.  A debridement was performed proportionally into the flexor sheath.  The flexor tendons appeared viable.  There was again no gross purulence either dorsally and palmarly.  This was then copiously irrigated with a liter of saline,  repacked with Iodoform gauze.  A sterile compressive dressing and splint to the wrist and finger was applied.  The patient was taken to the recovery room for observation in satisfactory condition.  She was admitted for IV antibiotics.          ______________________________ Cindee Salt, M.D.     GK/MEDQ  D:  06/20/2010  T:  06/21/2010  Job:  [id number]  Electronically Signed by Cindee Salt M.D. on 10/20/2010 12:09:54 PM

## 2010-11-12 LAB — BASIC METABOLIC PANEL
BUN: 20 mg/dL (ref 6–23)
BUN: 31 mg/dL — ABNORMAL HIGH (ref 6–23)
BUN: 39 mg/dL — ABNORMAL HIGH (ref 6–23)
CO2: 26 mEq/L (ref 19–32)
CO2: 27 mEq/L (ref 19–32)
CO2: 28 mEq/L (ref 19–32)
Calcium: 8.9 mg/dL (ref 8.4–10.5)
Calcium: 9.2 mg/dL (ref 8.4–10.5)
Calcium: 9.7 mg/dL (ref 8.4–10.5)
Chloride: 91 mEq/L — ABNORMAL LOW (ref 96–112)
Chloride: 93 mEq/L — ABNORMAL LOW (ref 96–112)
Chloride: 95 mEq/L — ABNORMAL LOW (ref 96–112)
Creatinine, Ser: 6.63 mg/dL — ABNORMAL HIGH (ref 0.4–1.2)
Creatinine, Ser: 7.94 mg/dL — ABNORMAL HIGH (ref 0.4–1.2)
GFR calc Af Amer: 7 mL/min — ABNORMAL LOW (ref >60)
GFR calc Af Amer: 8 mL/min — ABNORMAL LOW (ref >60)
GFR calc non Af Amer: 10 mL/min — ABNORMAL LOW (ref >60)
GFR calc non Af Amer: 7 mL/min — ABNORMAL LOW (ref >60)
Glucose, Bld: 184 mg/dL — ABNORMAL HIGH (ref 70–99)
Glucose, Bld: 72 mg/dL (ref 70–99)
Potassium: 4.4 mEq/L (ref 3.5–5.1)
Sodium: 131 mEq/L — ABNORMAL LOW (ref 135–145)
Sodium: 136 mEq/L (ref 135–145)

## 2010-11-12 LAB — CBC
HCT: 30.2 % — ABNORMAL LOW (ref 36.0–46.0)
Hemoglobin: 10.1 g/dL — ABNORMAL LOW (ref 12.0–15.0)
Hemoglobin: 10.4 g/dL — ABNORMAL LOW (ref 12.0–15.0)
Hemoglobin: 12.3 g/dL (ref 12.0–15.0)
MCH: 27.2 pg (ref 26.0–34.0)
MCH: 27.5 pg (ref 26.0–34.0)
MCH: 27.9 pg (ref 26.0–34.0)
MCH: 27.9 pg (ref 26.0–34.0)
MCHC: 32.9 g/dL (ref 30.0–36.0)
MCHC: 33.5 g/dL (ref 30.0–36.0)
MCHC: 33.8 g/dL (ref 30.0–36.0)
MCHC: 33.9 g/dL (ref 30.0–36.0)
MCV: 82 fL (ref 78.0–100.0)
MCV: 82.4 fL (ref 78.0–100.0)
Platelets: 311 10*3/uL (ref 150–400)
Platelets: 320 10*3/uL (ref 150–400)
Platelets: 329 10*3/uL (ref 150–400)
Platelets: 359 10*3/uL (ref 150–400)
RBC: 3.36 MIL/uL — ABNORMAL LOW (ref 3.87–5.11)
RBC: 4.66 MIL/uL (ref 3.87–5.11)
RDW: 16.6 % — ABNORMAL HIGH (ref 11.5–15.5)
RDW: 16.7 % — ABNORMAL HIGH (ref 11.5–15.5)
RDW: 16.9 % — ABNORMAL HIGH (ref 11.5–15.5)
WBC: 14.1 10*3/uL — ABNORMAL HIGH (ref 4.0–10.5)
WBC: 16 10*3/uL — ABNORMAL HIGH (ref 4.0–10.5)

## 2010-11-12 LAB — GLUCOSE, CAPILLARY
Glucose-Capillary: 114 mg/dL — ABNORMAL HIGH (ref 70–99)
Glucose-Capillary: 115 mg/dL — ABNORMAL HIGH (ref 70–99)
Glucose-Capillary: 116 mg/dL — ABNORMAL HIGH (ref 70–99)
Glucose-Capillary: 127 mg/dL — ABNORMAL HIGH (ref 70–99)
Glucose-Capillary: 127 mg/dL — ABNORMAL HIGH (ref 70–99)
Glucose-Capillary: 150 mg/dL — ABNORMAL HIGH (ref 70–99)
Glucose-Capillary: 157 mg/dL — ABNORMAL HIGH (ref 70–99)
Glucose-Capillary: 163 mg/dL — ABNORMAL HIGH (ref 70–99)
Glucose-Capillary: 163 mg/dL — ABNORMAL HIGH (ref 70–99)
Glucose-Capillary: 183 mg/dL — ABNORMAL HIGH (ref 70–99)
Glucose-Capillary: 189 mg/dL — ABNORMAL HIGH (ref 70–99)
Glucose-Capillary: 368 mg/dL — ABNORMAL HIGH (ref 70–99)
Glucose-Capillary: 62 mg/dL — ABNORMAL LOW (ref 70–99)
Glucose-Capillary: 65 mg/dL — ABNORMAL LOW (ref 70–99)
Glucose-Capillary: 74 mg/dL (ref 70–99)
Glucose-Capillary: 84 mg/dL (ref 70–99)
Glucose-Capillary: 94 mg/dL (ref 70–99)
Glucose-Capillary: 96 mg/dL (ref 70–99)

## 2010-11-12 LAB — RENAL FUNCTION PANEL
Albumin: 2.4 g/dL — ABNORMAL LOW (ref 3.5–5.2)
Albumin: 3.1 g/dL — ABNORMAL LOW (ref 3.5–5.2)
BUN: 27 mg/dL — ABNORMAL HIGH (ref 6–23)
Calcium: 9.4 mg/dL (ref 8.4–10.5)
Chloride: 96 mEq/L (ref 96–112)
Creatinine, Ser: 6.26 mg/dL — ABNORMAL HIGH (ref 0.4–1.2)
Creatinine, Ser: 8.5 mg/dL — ABNORMAL HIGH (ref 0.4–1.2)
GFR calc Af Amer: 6 mL/min — ABNORMAL LOW (ref >60)
GFR calc non Af Amer: 5 mL/min — ABNORMAL LOW (ref >60)
Glucose, Bld: 169 mg/dL — ABNORMAL HIGH (ref 70–99)
Phosphorus: 4.5 mg/dL (ref 2.3–4.6)
Potassium: 4.3 mEq/L (ref 3.5–5.1)
Potassium: 4.6 mEq/L (ref 3.5–5.1)
Sodium: 133 mEq/L — ABNORMAL LOW (ref 135–145)

## 2010-11-12 LAB — POCT I-STAT, CHEM 8
Calcium, Ion: 1.11 mmol/L — ABNORMAL LOW (ref 1.12–1.32)
Creatinine, Ser: 4.4 mg/dL — ABNORMAL HIGH (ref 0.4–1.2)
Glucose, Bld: 135 mg/dL — ABNORMAL HIGH (ref 70–99)
Hemoglobin: 13.3 g/dL (ref 12.0–15.0)
TCO2: 33 mmol/L (ref 0–100)

## 2010-11-12 LAB — WOUND CULTURE

## 2010-11-12 LAB — FUNGUS CULTURE W SMEAR

## 2010-11-12 LAB — POCT I-STAT 4, (NA,K, GLUC, HGB,HCT): Hemoglobin: 12.2 g/dL (ref 12.0–15.0)

## 2010-11-12 LAB — CULTURE, BLOOD (ROUTINE X 2)
Culture  Setup Time: 201110201607
Culture: NO GROWTH

## 2010-11-12 LAB — HEMOGLOBIN A1C
Hgb A1c MFr Bld: 9.8 % — ABNORMAL HIGH (ref <5.7)
Mean Plasma Glucose: 235 mg/dL — ABNORMAL HIGH (ref <117)

## 2010-11-12 LAB — AFB CULTURE WITH SMEAR (NOT AT ARMC)

## 2010-11-13 LAB — POCT I-STAT, CHEM 8
BUN: 47 mg/dL — ABNORMAL HIGH (ref 6–23)
Calcium, Ion: 1.16 mmol/L (ref 1.12–1.32)
Chloride: 101 mEq/L (ref 96–112)
Creatinine, Ser: 6 mg/dL — ABNORMAL HIGH (ref 0.4–1.2)
Glucose, Bld: 92 mg/dL (ref 70–99)
Glucose, Bld: 97 mg/dL (ref 70–99)
HCT: 46 % (ref 36.0–46.0)
Hemoglobin: 15.6 g/dL — ABNORMAL HIGH (ref 12.0–15.0)
Potassium: 6.1 mEq/L — ABNORMAL HIGH (ref 3.5–5.1)

## 2010-11-16 LAB — GLUCOSE, CAPILLARY: Glucose-Capillary: 175 mg/dL — ABNORMAL HIGH (ref 70–99)

## 2010-11-17 LAB — BASIC METABOLIC PANEL
Chloride: 90 mEq/L — ABNORMAL LOW (ref 96–112)
Chloride: 97 mEq/L (ref 96–112)
Creatinine, Ser: 7.08 mg/dL — ABNORMAL HIGH (ref 0.4–1.2)
GFR calc Af Amer: 11 mL/min — ABNORMAL LOW (ref >60)
GFR calc Af Amer: 8 mL/min — ABNORMAL LOW (ref >60)
GFR calc non Af Amer: 6 mL/min — ABNORMAL LOW (ref >60)
Potassium: 4 mEq/L (ref 3.5–5.1)
Potassium: 6.5 mEq/L (ref 3.5–5.1)
Sodium: 130 mEq/L — ABNORMAL LOW (ref 135–145)

## 2010-11-17 LAB — POCT I-STAT 4, (NA,K, GLUC, HGB,HCT)
HCT: 53 % — ABNORMAL HIGH (ref 36.0–46.0)
Hemoglobin: 18 g/dL — ABNORMAL HIGH (ref 12.0–15.0)
Potassium: 4.5 mEq/L (ref 3.5–5.1)
Sodium: 134 mEq/L — ABNORMAL LOW (ref 135–145)

## 2010-11-17 LAB — CBC
HCT: 30.9 % — ABNORMAL LOW (ref 36.0–46.0)
HCT: 42.8 % (ref 36.0–46.0)
Hemoglobin: 10.1 g/dL — ABNORMAL LOW (ref 12.0–15.0)
MCV: 82.8 fL (ref 78.0–100.0)
MCV: 81.8 fL (ref 78.0–100.0)
RBC: 3.73 MIL/uL — ABNORMAL LOW (ref 3.87–5.11)
RBC: 5.23 MIL/uL — ABNORMAL HIGH (ref 3.87–5.11)
WBC: 19.6 10*3/uL — ABNORMAL HIGH (ref 4.0–10.5)
WBC: 12.7 10*3/uL — ABNORMAL HIGH (ref 4.0–10.5)

## 2010-11-17 LAB — GLUCOSE, CAPILLARY
Glucose-Capillary: 190 mg/dL — ABNORMAL HIGH (ref 70–99)
Glucose-Capillary: 197 mg/dL — ABNORMAL HIGH (ref 70–99)
Glucose-Capillary: 208 mg/dL — ABNORMAL HIGH (ref 70–99)
Glucose-Capillary: 74 mg/dL (ref 70–99)

## 2010-11-21 LAB — DIFFERENTIAL
Band Neutrophils: 0 % (ref 0–10)
Basophils Absolute: 0 10*3/uL (ref 0.0–0.1)
Basophils Absolute: 0 10*3/uL (ref 0.0–0.1)
Basophils Absolute: 0 10*3/uL (ref 0.0–0.1)
Basophils Relative: 0 % (ref 0–1)
Basophils Relative: 0 % (ref 0–1)
Basophils Relative: 0 % (ref 0–1)
Basophils Relative: 0 % (ref 0–1)
Blasts: 0 %
Eosinophils Absolute: 0 10*3/uL (ref 0.0–0.7)
Eosinophils Relative: 0 % (ref 0–5)
Eosinophils Relative: 0 % (ref 0–5)
Lymphocytes Relative: 10 % — ABNORMAL LOW (ref 12–46)
Lymphocytes Relative: 10 % — ABNORMAL LOW (ref 12–46)
Lymphs Abs: 3.3 10*3/uL (ref 0.7–4.0)
Lymphs Abs: 4.5 10*3/uL — ABNORMAL HIGH (ref 0.7–4.0)
Metamyelocytes Relative: 0 %
Metamyelocytes Relative: 0 %
Monocytes Relative: 6 % (ref 3–12)
Myelocytes: 0 %
Neutro Abs: 36.8 10*3/uL — ABNORMAL HIGH (ref 1.7–7.7)
Neutro Abs: 39.1 10*3/uL — ABNORMAL HIGH (ref 1.7–7.7)
Neutrophils Relative %: 86 % — ABNORMAL HIGH (ref 43–77)
Neutrophils Relative %: 87 % — ABNORMAL HIGH (ref 43–77)
Promyelocytes Absolute: 0 %
Promyelocytes Absolute: 0 %
nRBC: 0 /100 WBC

## 2010-11-21 LAB — CROSSMATCH
ABO/RH(D): A POS
ABO/RH(D): A POS
Antibody Screen: NEGATIVE
Antibody Screen: NEGATIVE

## 2010-11-21 LAB — CBC
HCT: 21.4 % — ABNORMAL LOW (ref 36.0–46.0)
HCT: 23.3 % — ABNORMAL LOW (ref 36.0–46.0)
HCT: 25 % — ABNORMAL LOW (ref 36.0–46.0)
HCT: 27.6 % — ABNORMAL LOW (ref 36.0–46.0)
HCT: 28.4 % — ABNORMAL LOW (ref 36.0–46.0)
HCT: 29.3 % — ABNORMAL LOW (ref 36.0–46.0)
Hemoglobin: 10.1 g/dL — ABNORMAL LOW (ref 12.0–15.0)
Hemoglobin: 11 g/dL — ABNORMAL LOW (ref 12.0–15.0)
Hemoglobin: 7.3 g/dL — ABNORMAL LOW (ref 12.0–15.0)
Hemoglobin: 8 g/dL — ABNORMAL LOW (ref 12.0–15.0)
Hemoglobin: 8.6 g/dL — ABNORMAL LOW (ref 12.0–15.0)
Hemoglobin: 9.1 g/dL — ABNORMAL LOW (ref 12.0–15.0)
Hemoglobin: 9.5 g/dL — ABNORMAL LOW (ref 12.0–15.0)
MCHC: 32 g/dL (ref 30.0–36.0)
MCHC: 32.5 g/dL (ref 30.0–36.0)
MCHC: 32.6 g/dL (ref 30.0–36.0)
MCHC: 32.7 g/dL (ref 30.0–36.0)
MCHC: 32.9 g/dL (ref 30.0–36.0)
MCHC: 33 g/dL (ref 30.0–36.0)
MCHC: 33 g/dL (ref 30.0–36.0)
MCHC: 33.4 g/dL (ref 30.0–36.0)
MCHC: 33.5 g/dL (ref 30.0–36.0)
MCV: 78.9 fL (ref 78.0–100.0)
MCV: 81.7 fL (ref 78.0–100.0)
MCV: 82.3 fL (ref 78.0–100.0)
MCV: 83.3 fL (ref 78.0–100.0)
MCV: 83.9 fL (ref 78.0–100.0)
MCV: 84.5 fL (ref 78.0–100.0)
MCV: 84.6 fL (ref 78.0–100.0)
MCV: 84.7 fL (ref 78.0–100.0)
MCV: 85 fL (ref 78.0–100.0)
MCV: 85.5 fL (ref 78.0–100.0)
Platelets: 283 10*3/uL (ref 150–400)
Platelets: 400 10*3/uL (ref 150–400)
Platelets: 430 10*3/uL — ABNORMAL HIGH (ref 150–400)
Platelets: 446 10*3/uL — ABNORMAL HIGH (ref 150–400)
Platelets: 510 10*3/uL — ABNORMAL HIGH (ref 150–400)
Platelets: 531 10*3/uL — ABNORMAL HIGH (ref 150–400)
Platelets: 537 10*3/uL — ABNORMAL HIGH (ref 150–400)
Platelets: 591 10*3/uL — ABNORMAL HIGH (ref 150–400)
Platelets: 610 10*3/uL — ABNORMAL HIGH (ref 150–400)
RBC: 2.37 MIL/uL — ABNORMAL LOW (ref 3.87–5.11)
RBC: 2.46 MIL/uL — ABNORMAL LOW (ref 3.87–5.11)
RBC: 2.52 MIL/uL — ABNORMAL LOW (ref 3.87–5.11)
RBC: 2.94 MIL/uL — ABNORMAL LOW (ref 3.87–5.11)
RBC: 2.95 MIL/uL — ABNORMAL LOW (ref 3.87–5.11)
RBC: 3.16 MIL/uL — ABNORMAL LOW (ref 3.87–5.11)
RBC: 3.27 MIL/uL — ABNORMAL LOW (ref 3.87–5.11)
RBC: 3.29 MIL/uL — ABNORMAL LOW (ref 3.87–5.11)
RBC: 3.47 MIL/uL — ABNORMAL LOW (ref 3.87–5.11)
RBC: 3.96 MIL/uL (ref 3.87–5.11)
RDW: 18.5 % — ABNORMAL HIGH (ref 11.5–15.5)
RDW: 19.5 % — ABNORMAL HIGH (ref 11.5–15.5)
RDW: 20.1 % — ABNORMAL HIGH (ref 11.5–15.5)
RDW: 20.9 % — ABNORMAL HIGH (ref 11.5–15.5)
RDW: 21.3 % — ABNORMAL HIGH (ref 11.5–15.5)
RDW: 21.3 % — ABNORMAL HIGH (ref 11.5–15.5)
RDW: 21.5 % — ABNORMAL HIGH (ref 11.5–15.5)
RDW: 25.4 % — ABNORMAL HIGH (ref 11.5–15.5)
WBC: 25.2 10*3/uL — ABNORMAL HIGH (ref 4.0–10.5)
WBC: 30.8 10*3/uL — ABNORMAL HIGH (ref 4.0–10.5)
WBC: 32.9 10*3/uL — ABNORMAL HIGH (ref 4.0–10.5)
WBC: 34.7 10*3/uL — ABNORMAL HIGH (ref 4.0–10.5)
WBC: 38.3 10*3/uL — ABNORMAL HIGH (ref 4.0–10.5)
WBC: 38.3 10*3/uL — ABNORMAL HIGH (ref 4.0–10.5)

## 2010-11-21 LAB — BASIC METABOLIC PANEL
BUN: 17 mg/dL (ref 6–23)
BUN: 26 mg/dL — ABNORMAL HIGH (ref 6–23)
BUN: 34 mg/dL — ABNORMAL HIGH (ref 6–23)
CO2: 22 mEq/L (ref 19–32)
CO2: 25 mEq/L (ref 19–32)
CO2: 26 mEq/L (ref 19–32)
CO2: 29 mEq/L (ref 19–32)
Calcium: 8.3 mg/dL — ABNORMAL LOW (ref 8.4–10.5)
Calcium: 8.6 mg/dL (ref 8.4–10.5)
Calcium: 8.7 mg/dL (ref 8.4–10.5)
Chloride: 101 mEq/L (ref 96–112)
Chloride: 87 mEq/L — ABNORMAL LOW (ref 96–112)
Chloride: 94 mEq/L — ABNORMAL LOW (ref 96–112)
Chloride: 94 mEq/L — ABNORMAL LOW (ref 96–112)
Chloride: 96 mEq/L (ref 96–112)
Creatinine, Ser: 4.95 mg/dL — ABNORMAL HIGH (ref 0.4–1.2)
Creatinine, Ser: 5.05 mg/dL — ABNORMAL HIGH (ref 0.4–1.2)
Creatinine, Ser: 5.42 mg/dL — ABNORMAL HIGH (ref 0.4–1.2)
GFR calc Af Amer: 10 mL/min — ABNORMAL LOW (ref >60)
GFR calc Af Amer: 11 mL/min — ABNORMAL LOW (ref >60)
GFR calc Af Amer: 12 mL/min — ABNORMAL LOW (ref >60)
GFR calc Af Amer: 12 mL/min — ABNORMAL LOW (ref >60)
GFR calc Af Amer: 15 mL/min — ABNORMAL LOW (ref >60)
GFR calc Af Amer: 15 mL/min — ABNORMAL LOW (ref >60)
GFR calc Af Amer: 21 mL/min — ABNORMAL LOW (ref >60)
GFR calc non Af Amer: 10 mL/min — ABNORMAL LOW (ref >60)
GFR calc non Af Amer: 12 mL/min — ABNORMAL LOW (ref >60)
GFR calc non Af Amer: 13 mL/min — ABNORMAL LOW (ref >60)
GFR calc non Af Amer: 8 mL/min — ABNORMAL LOW (ref >60)
Glucose, Bld: 112 mg/dL — ABNORMAL HIGH (ref 70–99)
Glucose, Bld: 186 mg/dL — ABNORMAL HIGH (ref 70–99)
Glucose, Bld: 62 mg/dL — ABNORMAL LOW (ref 70–99)
Glucose, Bld: 74 mg/dL (ref 70–99)
Potassium: 3.4 mEq/L — ABNORMAL LOW (ref 3.5–5.1)
Potassium: 3.7 mEq/L (ref 3.5–5.1)
Sodium: 131 mEq/L — ABNORMAL LOW (ref 135–145)
Sodium: 133 mEq/L — ABNORMAL LOW (ref 135–145)
Sodium: 134 mEq/L — ABNORMAL LOW (ref 135–145)
Sodium: 137 mEq/L (ref 135–145)

## 2010-11-21 LAB — RENAL FUNCTION PANEL
Albumin: 1.6 g/dL — ABNORMAL LOW (ref 3.5–5.2)
Albumin: 1.8 g/dL — ABNORMAL LOW (ref 3.5–5.2)
Albumin: 1.8 g/dL — ABNORMAL LOW (ref 3.5–5.2)
Albumin: 1.9 g/dL — ABNORMAL LOW (ref 3.5–5.2)
BUN: 26 mg/dL — ABNORMAL HIGH (ref 6–23)
CO2: 24 mEq/L (ref 19–32)
CO2: 26 mEq/L (ref 19–32)
CO2: 27 mEq/L (ref 19–32)
Calcium: 8.2 mg/dL — ABNORMAL LOW (ref 8.4–10.5)
Calcium: 8.4 mg/dL (ref 8.4–10.5)
Calcium: 8.7 mg/dL (ref 8.4–10.5)
Calcium: 8.8 mg/dL (ref 8.4–10.5)
Chloride: 92 mEq/L — ABNORMAL LOW (ref 96–112)
Chloride: 97 mEq/L (ref 96–112)
Creatinine, Ser: 2.95 mg/dL — ABNORMAL HIGH (ref 0.4–1.2)
Creatinine, Ser: 5.12 mg/dL — ABNORMAL HIGH (ref 0.4–1.2)
Creatinine, Ser: 5.68 mg/dL — ABNORMAL HIGH (ref 0.4–1.2)
Creatinine, Ser: 5.79 mg/dL — ABNORMAL HIGH (ref 0.4–1.2)
GFR calc Af Amer: 10 mL/min — ABNORMAL LOW (ref >60)
GFR calc Af Amer: 11 mL/min — ABNORMAL LOW (ref >60)
GFR calc Af Amer: 21 mL/min — ABNORMAL LOW (ref >60)
GFR calc Af Amer: 9 mL/min — ABNORMAL LOW (ref >60)
GFR calc non Af Amer: 18 mL/min — ABNORMAL LOW (ref >60)
GFR calc non Af Amer: 7 mL/min — ABNORMAL LOW (ref >60)
GFR calc non Af Amer: 8 mL/min — ABNORMAL LOW (ref >60)
GFR calc non Af Amer: 9 mL/min — ABNORMAL LOW (ref >60)
Glucose, Bld: 149 mg/dL — ABNORMAL HIGH (ref 70–99)
Glucose, Bld: 153 mg/dL — ABNORMAL HIGH (ref 70–99)
Phosphorus: 3.4 mg/dL (ref 2.3–4.6)
Phosphorus: 4.5 mg/dL (ref 2.3–4.6)
Phosphorus: 4.8 mg/dL — ABNORMAL HIGH (ref 2.3–4.6)
Potassium: 3.3 mEq/L — ABNORMAL LOW (ref 3.5–5.1)
Potassium: 4.1 mEq/L (ref 3.5–5.1)
Potassium: 4.3 mEq/L (ref 3.5–5.1)
Sodium: 126 mEq/L — ABNORMAL LOW (ref 135–145)
Sodium: 127 mEq/L — ABNORMAL LOW (ref 135–145)
Sodium: 129 mEq/L — ABNORMAL LOW (ref 135–145)
Sodium: 132 mEq/L — ABNORMAL LOW (ref 135–145)

## 2010-11-21 LAB — GLUCOSE, CAPILLARY
Glucose-Capillary: 113 mg/dL — ABNORMAL HIGH (ref 70–99)
Glucose-Capillary: 114 mg/dL — ABNORMAL HIGH (ref 70–99)
Glucose-Capillary: 124 mg/dL — ABNORMAL HIGH (ref 70–99)
Glucose-Capillary: 132 mg/dL — ABNORMAL HIGH (ref 70–99)
Glucose-Capillary: 132 mg/dL — ABNORMAL HIGH (ref 70–99)
Glucose-Capillary: 134 mg/dL — ABNORMAL HIGH (ref 70–99)
Glucose-Capillary: 135 mg/dL — ABNORMAL HIGH (ref 70–99)
Glucose-Capillary: 144 mg/dL — ABNORMAL HIGH (ref 70–99)
Glucose-Capillary: 148 mg/dL — ABNORMAL HIGH (ref 70–99)
Glucose-Capillary: 161 mg/dL — ABNORMAL HIGH (ref 70–99)
Glucose-Capillary: 162 mg/dL — ABNORMAL HIGH (ref 70–99)
Glucose-Capillary: 165 mg/dL — ABNORMAL HIGH (ref 70–99)
Glucose-Capillary: 183 mg/dL — ABNORMAL HIGH (ref 70–99)
Glucose-Capillary: 192 mg/dL — ABNORMAL HIGH (ref 70–99)
Glucose-Capillary: 216 mg/dL — ABNORMAL HIGH (ref 70–99)
Glucose-Capillary: 225 mg/dL — ABNORMAL HIGH (ref 70–99)
Glucose-Capillary: 251 mg/dL — ABNORMAL HIGH (ref 70–99)
Glucose-Capillary: 301 mg/dL — ABNORMAL HIGH (ref 70–99)
Glucose-Capillary: 52 mg/dL — ABNORMAL LOW (ref 70–99)
Glucose-Capillary: 59 mg/dL — ABNORMAL LOW (ref 70–99)
Glucose-Capillary: 74 mg/dL (ref 70–99)
Glucose-Capillary: 77 mg/dL (ref 70–99)
Glucose-Capillary: 78 mg/dL (ref 70–99)
Glucose-Capillary: 80 mg/dL (ref 70–99)
Glucose-Capillary: 80 mg/dL (ref 70–99)
Glucose-Capillary: 81 mg/dL (ref 70–99)
Glucose-Capillary: 85 mg/dL (ref 70–99)
Glucose-Capillary: 86 mg/dL (ref 70–99)
Glucose-Capillary: 89 mg/dL (ref 70–99)
Glucose-Capillary: 99 mg/dL (ref 70–99)

## 2010-11-21 LAB — CULTURE, BLOOD (ROUTINE X 2)
Culture: NO GROWTH
Culture: NO GROWTH
Culture: NO GROWTH

## 2010-11-21 LAB — TYPE AND SCREEN: Antibody Screen: NEGATIVE

## 2010-11-21 LAB — POCT I-STAT 4, (NA,K, GLUC, HGB,HCT)
HCT: 30 % — ABNORMAL LOW (ref 36.0–46.0)
Hemoglobin: 10.2 g/dL — ABNORMAL LOW (ref 12.0–15.0)
Potassium: 3.3 mEq/L — ABNORMAL LOW (ref 3.5–5.1)
Sodium: 137 mEq/L (ref 135–145)

## 2010-11-21 LAB — COMPREHENSIVE METABOLIC PANEL
ALT: 11 U/L (ref 0–35)
ALT: 9 U/L (ref 0–35)
AST: 11 U/L (ref 0–37)
AST: 16 U/L (ref 0–37)
Albumin: 2 g/dL — ABNORMAL LOW (ref 3.5–5.2)
Albumin: 2.2 g/dL — ABNORMAL LOW (ref 3.5–5.2)
Alkaline Phosphatase: 90 U/L (ref 39–117)
Alkaline Phosphatase: 92 U/L (ref 39–117)
Chloride: 91 mEq/L — ABNORMAL LOW (ref 96–112)
Chloride: 94 mEq/L — ABNORMAL LOW (ref 96–112)
Creatinine, Ser: 6.29 mg/dL — ABNORMAL HIGH (ref 0.4–1.2)
GFR calc Af Amer: 19 mL/min — ABNORMAL LOW (ref >60)
GFR calc Af Amer: 9 mL/min — ABNORMAL LOW (ref >60)
Potassium: 3.1 mEq/L — ABNORMAL LOW (ref 3.5–5.1)
Potassium: 4 mEq/L (ref 3.5–5.1)
Sodium: 131 mEq/L — ABNORMAL LOW (ref 135–145)
Total Bilirubin: 0.6 mg/dL (ref 0.3–1.2)
Total Bilirubin: 1.1 mg/dL (ref 0.3–1.2)

## 2010-11-21 LAB — LACTIC ACID, PLASMA: Lactic Acid, Venous: 1.1 mmol/L (ref 0.5–2.2)

## 2010-11-21 LAB — CLOSTRIDIUM DIFFICILE EIA

## 2010-11-21 LAB — MAGNESIUM: Magnesium: 1.9 mg/dL (ref 1.5–2.5)

## 2010-11-21 LAB — HEMOGLOBIN A1C: Mean Plasma Glucose: 154 mg/dL

## 2010-11-21 LAB — POTASSIUM: Potassium: 5.4 mEq/L — ABNORMAL HIGH (ref 3.5–5.1)

## 2010-11-24 LAB — COMPREHENSIVE METABOLIC PANEL
AST: 24 U/L (ref 0–37)
Alkaline Phosphatase: 154 U/L — ABNORMAL HIGH (ref 39–117)
CO2: 31 mEq/L (ref 19–32)
Chloride: 96 mEq/L (ref 96–112)
Creatinine, Ser: 2.61 mg/dL — ABNORMAL HIGH (ref 0.4–1.2)
GFR calc Af Amer: 25 mL/min — ABNORMAL LOW (ref >60)
GFR calc non Af Amer: 20 mL/min — ABNORMAL LOW (ref >60)
Potassium: 3.8 mEq/L (ref 3.5–5.1)
Total Bilirubin: 0.4 mg/dL (ref 0.3–1.2)

## 2010-11-24 LAB — CBC
HCT: 35 % — ABNORMAL LOW (ref 36.0–46.0)
MCV: 87.9 fL (ref 78.0–100.0)
RBC: 3.98 MIL/uL (ref 3.87–5.11)
WBC: 15 10*3/uL — ABNORMAL HIGH (ref 4.0–10.5)

## 2010-11-24 LAB — GLUCOSE, CAPILLARY: Glucose-Capillary: 182 mg/dL — ABNORMAL HIGH (ref 70–99)

## 2010-11-24 LAB — APTT: aPTT: 31 seconds (ref 24–37)

## 2010-12-02 LAB — POCT I-STAT 4, (NA,K, GLUC, HGB,HCT)
Potassium: 3.8 mEq/L (ref 3.5–5.1)
Sodium: 133 mEq/L — ABNORMAL LOW (ref 135–145)

## 2010-12-03 LAB — COMPREHENSIVE METABOLIC PANEL
ALT: <8 U/L (ref 0–35)
AST: 24 U/L (ref 0–37)
Albumin: 3.3 g/dL — ABNORMAL LOW (ref 3.5–5.2)
CO2: 25 mEq/L (ref 19–32)
Calcium: 9.1 mg/dL (ref 8.4–10.5)
Chloride: 95 mEq/L — ABNORMAL LOW (ref 96–112)
GFR calc Af Amer: 15 mL/min — ABNORMAL LOW (ref >60)
GFR calc non Af Amer: 12 mL/min — ABNORMAL LOW (ref >60)
Sodium: 134 mEq/L — ABNORMAL LOW (ref 135–145)
Total Bilirubin: 0.9 mg/dL (ref 0.3–1.2)

## 2010-12-03 LAB — CBC
RBC: 5.78 MIL/uL — ABNORMAL HIGH (ref 3.87–5.11)
WBC: 15.9 10*3/uL — ABNORMAL HIGH (ref 4.0–10.5)

## 2010-12-03 LAB — DIFFERENTIAL
Basophils Absolute: 0 10*3/uL (ref 0.0–0.1)
Basophils Relative: 0 % (ref 0–1)
Lymphs Abs: 1.3 10*3/uL (ref 0.7–4.0)
Monocytes Relative: 3 % (ref 3–12)
Neutro Abs: 14.1 10*3/uL — ABNORMAL HIGH (ref 1.7–7.7)

## 2010-12-03 LAB — GLUCOSE, CAPILLARY: Glucose-Capillary: 250 mg/dL — ABNORMAL HIGH (ref 70–99)

## 2010-12-04 LAB — DIFFERENTIAL
Basophils Absolute: 0.1 10*3/uL (ref 0.0–0.1)
Lymphocytes Relative: 13 % (ref 12–46)
Lymphs Abs: 1.9 10*3/uL (ref 0.7–4.0)
Neutro Abs: 12.8 10*3/uL — ABNORMAL HIGH (ref 1.7–7.7)
Neutrophils Relative %: 83 % — ABNORMAL HIGH (ref 43–77)

## 2010-12-04 LAB — COMPREHENSIVE METABOLIC PANEL
BUN: 19 mg/dL (ref 6–23)
CO2: 24 mEq/L (ref 19–32)
Calcium: 9.6 mg/dL (ref 8.4–10.5)
Chloride: 96 mEq/L (ref 96–112)
Creatinine, Ser: 6.2 mg/dL — ABNORMAL HIGH (ref 0.4–1.2)
GFR calc Af Amer: 9 mL/min — ABNORMAL LOW (ref >60)
GFR calc non Af Amer: 8 mL/min — ABNORMAL LOW (ref >60)
Glucose, Bld: 330 mg/dL — ABNORMAL HIGH (ref 70–99)
Total Bilirubin: 0.8 mg/dL (ref 0.3–1.2)

## 2010-12-04 LAB — LIPASE, BLOOD: Lipase: 43 U/L (ref 11–59)

## 2010-12-04 LAB — CBC
HCT: 44 % (ref 36.0–46.0)
Hemoglobin: 14.2 g/dL (ref 12.0–15.0)
MCHC: 32.2 g/dL (ref 30.0–36.0)
MCV: 83.4 fL (ref 78.0–100.0)
RBC: 5.27 MIL/uL — ABNORMAL HIGH (ref 3.87–5.11)

## 2010-12-04 LAB — LACTIC ACID, PLASMA: Lactic Acid, Venous: 1.1 mmol/L (ref 0.5–2.2)

## 2010-12-05 LAB — COMPREHENSIVE METABOLIC PANEL
ALT: <8 U/L (ref 0–35)
AST: 24 U/L (ref 0–37)
Alkaline Phosphatase: 117 U/L (ref 39–117)
CO2: 25 mEq/L (ref 19–32)
Calcium: 9.3 mg/dL (ref 8.4–10.5)
GFR calc Af Amer: 9 mL/min — ABNORMAL LOW (ref >60)
GFR calc non Af Amer: 7 mL/min — ABNORMAL LOW (ref >60)
Glucose, Bld: 72 mg/dL (ref 70–99)
Potassium: 4.1 mEq/L (ref 3.5–5.1)
Sodium: 135 mEq/L (ref 135–145)
Total Protein: 8 g/dL (ref 6.0–8.3)

## 2010-12-05 LAB — CBC
HCT: 33.7 % — ABNORMAL LOW (ref 36.0–46.0)
Hemoglobin: 10.8 g/dL — ABNORMAL LOW (ref 12.0–15.0)
Hemoglobin: 13.5 g/dL (ref 12.0–15.0)
MCHC: 32 g/dL (ref 30.0–36.0)
MCHC: 32.3 g/dL (ref 30.0–36.0)
MCV: 88.3 fL (ref 78.0–100.0)
Platelets: 230 10*3/uL (ref 150–400)
RBC: 4.7 MIL/uL (ref 3.87–5.11)
RDW: 21.8 % — ABNORMAL HIGH (ref 11.5–15.5)

## 2010-12-05 LAB — GLUCOSE, CAPILLARY
Glucose-Capillary: 104 mg/dL — ABNORMAL HIGH (ref 70–99)
Glucose-Capillary: 114 mg/dL — ABNORMAL HIGH (ref 70–99)
Glucose-Capillary: 158 mg/dL — ABNORMAL HIGH (ref 70–99)
Glucose-Capillary: 56 mg/dL — ABNORMAL LOW (ref 70–99)
Glucose-Capillary: 72 mg/dL (ref 70–99)
Glucose-Capillary: 74 mg/dL (ref 70–99)
Glucose-Capillary: 97 mg/dL (ref 70–99)
Glucose-Capillary: 99 mg/dL (ref 70–99)

## 2010-12-05 LAB — RENAL FUNCTION PANEL
BUN: 39 mg/dL — ABNORMAL HIGH (ref 6–23)
Creatinine, Ser: 8.11 mg/dL — ABNORMAL HIGH (ref 0.4–1.2)
Glucose, Bld: 239 mg/dL — ABNORMAL HIGH (ref 70–99)
Phosphorus: 9.1 mg/dL — ABNORMAL HIGH (ref 2.3–4.6)
Potassium: 5.3 mEq/L — ABNORMAL HIGH (ref 3.5–5.1)

## 2010-12-05 LAB — PROTIME-INR: Prothrombin Time: 12.2 seconds (ref 11.6–15.2)

## 2010-12-06 LAB — CBC
HCT: 23 % — ABNORMAL LOW (ref 36.0–46.0)
Hemoglobin: 7.4 g/dL — CL (ref 12.0–15.0)
MCHC: 32.1 g/dL (ref 30.0–36.0)
RDW: 21.7 % — ABNORMAL HIGH (ref 11.5–15.5)

## 2010-12-06 LAB — RENAL FUNCTION PANEL
BUN: 21 mg/dL (ref 6–23)
Calcium: 8.6 mg/dL (ref 8.4–10.5)
Glucose, Bld: 199 mg/dL — ABNORMAL HIGH (ref 70–99)
Phosphorus: 4.5 mg/dL (ref 2.3–4.6)

## 2010-12-06 LAB — RETICULOCYTES
RBC.: 2.98 MIL/uL — ABNORMAL LOW (ref 3.87–5.11)
Retic Count, Absolute: 175.8 10*3/uL (ref 19.0–186.0)
Retic Ct Pct: 5.9 % — ABNORMAL HIGH (ref 0.4–3.1)

## 2010-12-06 LAB — CROSSMATCH

## 2010-12-06 LAB — IRON AND TIBC: UIBC: 84 ug/dL

## 2010-12-06 LAB — VITAMIN B12: Vitamin B-12: >2000 pg/mL — ABNORMAL HIGH (ref 211–911)

## 2010-12-06 LAB — GLUCOSE, CAPILLARY

## 2010-12-06 LAB — FERRITIN: Ferritin: 2285 ng/mL — ABNORMAL HIGH (ref 10–291)

## 2010-12-07 LAB — BASIC METABOLIC PANEL
BUN: 24 mg/dL — ABNORMAL HIGH (ref 6–23)
BUN: 27 mg/dL — ABNORMAL HIGH (ref 6–23)
CO2: 25 mEq/L (ref 19–32)
CO2: 26 mEq/L (ref 19–32)
Calcium: 8.3 mg/dL — ABNORMAL LOW (ref 8.4–10.5)
Calcium: 8.5 mg/dL (ref 8.4–10.5)
Chloride: 94 mEq/L — ABNORMAL LOW (ref 96–112)
Creatinine, Ser: 2.47 mg/dL — ABNORMAL HIGH (ref 0.4–1.2)
Creatinine, Ser: 4.11 mg/dL — ABNORMAL HIGH (ref 0.4–1.2)
GFR calc Af Amer: 15 mL/min — ABNORMAL LOW (ref >60)
GFR calc Af Amer: 26 mL/min — ABNORMAL LOW (ref >60)
GFR calc non Af Amer: 11 mL/min — ABNORMAL LOW (ref >60)
GFR calc non Af Amer: 22 mL/min — ABNORMAL LOW (ref >60)
Glucose, Bld: 223 mg/dL — ABNORMAL HIGH (ref 70–99)
Potassium: 3.2 mEq/L — ABNORMAL LOW (ref 3.5–5.1)
Sodium: 131 mEq/L — ABNORMAL LOW (ref 135–145)

## 2010-12-07 LAB — CBC
HCT: 23.1 % — ABNORMAL LOW (ref 36.0–46.0)
HCT: 23.9 % — ABNORMAL LOW (ref 36.0–46.0)
HCT: 26 % — ABNORMAL LOW (ref 36.0–46.0)
HCT: 27 % — ABNORMAL LOW (ref 36.0–46.0)
HCT: 28.6 % — ABNORMAL LOW (ref 36.0–46.0)
HCT: 29.2 % — ABNORMAL LOW (ref 36.0–46.0)
HCT: 30.1 % — ABNORMAL LOW (ref 36.0–46.0)
Hemoglobin: 7.7 g/dL — CL (ref 12.0–15.0)
Hemoglobin: 8.6 g/dL — ABNORMAL LOW (ref 12.0–15.0)
Hemoglobin: 8.8 g/dL — ABNORMAL LOW (ref 12.0–15.0)
Hemoglobin: 9.7 g/dL — ABNORMAL LOW (ref 12.0–15.0)
MCHC: 31.5 g/dL (ref 30.0–36.0)
MCHC: 32.3 g/dL (ref 30.0–36.0)
MCHC: 32.4 g/dL (ref 30.0–36.0)
MCHC: 32.5 g/dL (ref 30.0–36.0)
MCHC: 33 g/dL (ref 30.0–36.0)
MCHC: 33.5 g/dL (ref 30.0–36.0)
MCV: 84.1 fL (ref 78.0–100.0)
MCV: 85.3 fL (ref 78.0–100.0)
MCV: 85.4 fL (ref 78.0–100.0)
MCV: 85.5 fL (ref 78.0–100.0)
MCV: 87.3 fL (ref 78.0–100.0)
MCV: 87.6 fL (ref 78.0–100.0)
Platelets: 217 10*3/uL (ref 150–400)
Platelets: 300 10*3/uL (ref 150–400)
Platelets: 348 10*3/uL (ref 150–400)
Platelets: 383 10*3/uL (ref 150–400)
Platelets: ADEQUATE 10*3/uL (ref 150–400)
RBC: 2.71 MIL/uL — ABNORMAL LOW (ref 3.87–5.11)
RBC: 3.04 MIL/uL — ABNORMAL LOW (ref 3.87–5.11)
RBC: 3.15 MIL/uL — ABNORMAL LOW (ref 3.87–5.11)
RBC: 3.36 MIL/uL — ABNORMAL LOW (ref 3.87–5.11)
RBC: 3.43 MIL/uL — ABNORMAL LOW (ref 3.87–5.11)
RDW: 18.5 % — ABNORMAL HIGH (ref 11.5–15.5)
RDW: 19.4 % — ABNORMAL HIGH (ref 11.5–15.5)
RDW: 19.4 % — ABNORMAL HIGH (ref 11.5–15.5)
RDW: 19.5 % — ABNORMAL HIGH (ref 11.5–15.5)
WBC: 14.9 10*3/uL — ABNORMAL HIGH (ref 4.0–10.5)
WBC: 21.8 10*3/uL — ABNORMAL HIGH (ref 4.0–10.5)
WBC: 23.3 10*3/uL — ABNORMAL HIGH (ref 4.0–10.5)
WBC: 23.4 10*3/uL — ABNORMAL HIGH (ref 4.0–10.5)

## 2010-12-07 LAB — RENAL FUNCTION PANEL
Albumin: 2 g/dL — ABNORMAL LOW (ref 3.5–5.2)
Albumin: 2 g/dL — ABNORMAL LOW (ref 3.5–5.2)
Albumin: 2.1 g/dL — ABNORMAL LOW (ref 3.5–5.2)
Albumin: 2.6 g/dL — ABNORMAL LOW (ref 3.5–5.2)
BUN: 13 mg/dL (ref 6–23)
BUN: 14 mg/dL (ref 6–23)
BUN: 18 mg/dL (ref 6–23)
BUN: 35 mg/dL — ABNORMAL HIGH (ref 6–23)
BUN: 6 mg/dL (ref 6–23)
CO2: 24 mEq/L (ref 19–32)
CO2: 24 mEq/L (ref 19–32)
CO2: 27 mEq/L (ref 19–32)
CO2: 29 mEq/L (ref 19–32)
Calcium: 8.5 mg/dL (ref 8.4–10.5)
Calcium: 8.9 mg/dL (ref 8.4–10.5)
Calcium: 8.9 mg/dL (ref 8.4–10.5)
Chloride: 100 mEq/L (ref 96–112)
Chloride: 101 mEq/L (ref 96–112)
Chloride: 98 mEq/L (ref 96–112)
Chloride: 99 mEq/L (ref 96–112)
Creatinine, Ser: 4.29 mg/dL — ABNORMAL HIGH (ref 0.4–1.2)
Creatinine, Ser: 4.37 mg/dL — ABNORMAL HIGH (ref 0.4–1.2)
GFR calc Af Amer: 14 mL/min — ABNORMAL LOW (ref >60)
GFR calc non Af Amer: 11 mL/min — ABNORMAL LOW (ref >60)
GFR calc non Af Amer: 22 mL/min — ABNORMAL LOW (ref >60)
Glucose, Bld: 161 mg/dL — ABNORMAL HIGH (ref 70–99)
Glucose, Bld: 199 mg/dL — ABNORMAL HIGH (ref 70–99)
Glucose, Bld: 95 mg/dL (ref 70–99)
Phosphorus: 1.9 mg/dL — ABNORMAL LOW (ref 2.3–4.6)
Phosphorus: 5.1 mg/dL — ABNORMAL HIGH (ref 2.3–4.6)
Potassium: 4.2 mEq/L (ref 3.5–5.1)
Potassium: 4.4 mEq/L (ref 3.5–5.1)
Potassium: 6.9 mEq/L (ref 3.5–5.1)
Sodium: 134 mEq/L — ABNORMAL LOW (ref 135–145)

## 2010-12-07 LAB — GLUCOSE, CAPILLARY
Glucose-Capillary: 121 mg/dL — ABNORMAL HIGH (ref 70–99)
Glucose-Capillary: 124 mg/dL — ABNORMAL HIGH (ref 70–99)
Glucose-Capillary: 149 mg/dL — ABNORMAL HIGH (ref 70–99)
Glucose-Capillary: 156 mg/dL — ABNORMAL HIGH (ref 70–99)
Glucose-Capillary: 160 mg/dL — ABNORMAL HIGH (ref 70–99)
Glucose-Capillary: 162 mg/dL — ABNORMAL HIGH (ref 70–99)
Glucose-Capillary: 162 mg/dL — ABNORMAL HIGH (ref 70–99)
Glucose-Capillary: 164 mg/dL — ABNORMAL HIGH (ref 70–99)
Glucose-Capillary: 166 mg/dL — ABNORMAL HIGH (ref 70–99)
Glucose-Capillary: 171 mg/dL — ABNORMAL HIGH (ref 70–99)
Glucose-Capillary: 175 mg/dL — ABNORMAL HIGH (ref 70–99)
Glucose-Capillary: 176 mg/dL — ABNORMAL HIGH (ref 70–99)
Glucose-Capillary: 178 mg/dL — ABNORMAL HIGH (ref 70–99)
Glucose-Capillary: 178 mg/dL — ABNORMAL HIGH (ref 70–99)
Glucose-Capillary: 180 mg/dL — ABNORMAL HIGH (ref 70–99)
Glucose-Capillary: 184 mg/dL — ABNORMAL HIGH (ref 70–99)
Glucose-Capillary: 188 mg/dL — ABNORMAL HIGH (ref 70–99)
Glucose-Capillary: 214 mg/dL — ABNORMAL HIGH (ref 70–99)
Glucose-Capillary: 231 mg/dL — ABNORMAL HIGH (ref 70–99)
Glucose-Capillary: 235 mg/dL — ABNORMAL HIGH (ref 70–99)
Glucose-Capillary: 242 mg/dL — ABNORMAL HIGH (ref 70–99)
Glucose-Capillary: 250 mg/dL — ABNORMAL HIGH (ref 70–99)
Glucose-Capillary: 278 mg/dL — ABNORMAL HIGH (ref 70–99)
Glucose-Capillary: 283 mg/dL — ABNORMAL HIGH (ref 70–99)
Glucose-Capillary: 55 mg/dL — ABNORMAL LOW (ref 70–99)
Glucose-Capillary: 61 mg/dL — ABNORMAL LOW (ref 70–99)
Glucose-Capillary: 69 mg/dL — ABNORMAL LOW (ref 70–99)
Glucose-Capillary: 80 mg/dL (ref 70–99)
Glucose-Capillary: 84 mg/dL (ref 70–99)

## 2010-12-07 LAB — APTT: aPTT: 27 seconds (ref 24–37)

## 2010-12-07 LAB — CROSSMATCH
ABO/RH(D): A POS
Antibody Screen: NEGATIVE

## 2010-12-07 LAB — HEPATIC FUNCTION PANEL
AST: 17 U/L (ref 0–37)
Albumin: 1.7 g/dL — ABNORMAL LOW (ref 3.5–5.2)
Alkaline Phosphatase: 130 U/L — ABNORMAL HIGH (ref 39–117)
Total Bilirubin: 1.3 mg/dL — ABNORMAL HIGH (ref 0.3–1.2)

## 2010-12-07 LAB — CULTURE, BAL-QUANTITATIVE W GRAM STAIN: Colony Count: >=100000

## 2010-12-07 LAB — CULTURE, BLOOD (ROUTINE X 2)
Culture: NO GROWTH
Culture: NO GROWTH

## 2010-12-07 LAB — COMPREHENSIVE METABOLIC PANEL
Albumin: 2.4 g/dL — ABNORMAL LOW (ref 3.5–5.2)
Alkaline Phosphatase: 120 U/L — ABNORMAL HIGH (ref 39–117)
BUN: 14 mg/dL (ref 6–23)
Potassium: 5.8 mEq/L — ABNORMAL HIGH (ref 3.5–5.1)
Total Protein: 8.5 g/dL — ABNORMAL HIGH (ref 6.0–8.3)

## 2010-12-07 LAB — POTASSIUM: Potassium: 6.5 mEq/L (ref 3.5–5.1)

## 2010-12-07 LAB — PROTIME-INR: INR: 1 (ref 0.00–1.49)

## 2010-12-08 LAB — CBC
HCT: 26.8 % — ABNORMAL LOW (ref 36.0–46.0)
HCT: 28.2 % — ABNORMAL LOW (ref 36.0–46.0)
HCT: 30.2 % — ABNORMAL LOW (ref 36.0–46.0)
HCT: 30.3 % — ABNORMAL LOW (ref 36.0–46.0)
HCT: 30.4 % — ABNORMAL LOW (ref 36.0–46.0)
HCT: 31.5 % — ABNORMAL LOW (ref 36.0–46.0)
HCT: 34.8 % — ABNORMAL LOW (ref 36.0–46.0)
Hemoglobin: 10.3 g/dL — ABNORMAL LOW (ref 12.0–15.0)
Hemoglobin: 10.7 g/dL — ABNORMAL LOW (ref 12.0–15.0)
Hemoglobin: 11.1 g/dL — ABNORMAL LOW (ref 12.0–15.0)
Hemoglobin: 11.1 g/dL — ABNORMAL LOW (ref 12.0–15.0)
Hemoglobin: 8.5 g/dL — ABNORMAL LOW (ref 12.0–15.0)
Hemoglobin: 8.8 g/dL — ABNORMAL LOW (ref 12.0–15.0)
Hemoglobin: 9.2 g/dL — ABNORMAL LOW (ref 12.0–15.0)
Hemoglobin: 9.4 g/dL — ABNORMAL LOW (ref 12.0–15.0)
Hemoglobin: 9.9 g/dL — ABNORMAL LOW (ref 12.0–15.0)
MCHC: 32.5 g/dL (ref 30.0–36.0)
MCHC: 32.8 g/dL (ref 30.0–36.0)
MCHC: 33.1 g/dL (ref 30.0–36.0)
MCHC: 33.2 g/dL (ref 30.0–36.0)
MCHC: 33.3 g/dL (ref 30.0–36.0)
MCHC: 33.3 g/dL (ref 30.0–36.0)
MCHC: 33.4 g/dL (ref 30.0–36.0)
MCHC: 33.5 g/dL (ref 30.0–36.0)
MCHC: 33.8 g/dL (ref 30.0–36.0)
MCHC: 33.8 g/dL (ref 30.0–36.0)
MCV: 85.6 fL (ref 78.0–100.0)
MCV: 85.6 fL (ref 78.0–100.0)
MCV: 85.7 fL (ref 78.0–100.0)
MCV: 85.9 fL (ref 78.0–100.0)
MCV: 85.9 fL (ref 78.0–100.0)
MCV: 86.1 fL (ref 78.0–100.0)
MCV: 86.6 fL (ref 78.0–100.0)
MCV: 86.9 fL (ref 78.0–100.0)
MCV: 87 fL (ref 78.0–100.0)
Platelets: 244 10*3/uL (ref 150–400)
Platelets: 268 10*3/uL (ref 150–400)
Platelets: 277 10*3/uL (ref 150–400)
Platelets: 290 10*3/uL (ref 150–400)
Platelets: 301 10*3/uL (ref 150–400)
Platelets: 307 10*3/uL (ref 150–400)
Platelets: 326 10*3/uL (ref 150–400)
Platelets: 339 10*3/uL (ref 150–400)
RBC: 3.1 MIL/uL — ABNORMAL LOW (ref 3.87–5.11)
RBC: 3.2 MIL/uL — ABNORMAL LOW (ref 3.87–5.11)
RBC: 3.29 MIL/uL — ABNORMAL LOW (ref 3.87–5.11)
RBC: 3.29 MIL/uL — ABNORMAL LOW (ref 3.87–5.11)
RBC: 3.31 MIL/uL — ABNORMAL LOW (ref 3.87–5.11)
RBC: 3.43 MIL/uL — ABNORMAL LOW (ref 3.87–5.11)
RBC: 3.53 MIL/uL — ABNORMAL LOW (ref 3.87–5.11)
RBC: 3.54 MIL/uL — ABNORMAL LOW (ref 3.87–5.11)
RBC: 3.89 MIL/uL (ref 3.87–5.11)
RBC: 3.98 MIL/uL (ref 3.87–5.11)
RDW: 16.4 % — ABNORMAL HIGH (ref 11.5–15.5)
RDW: 17 % — ABNORMAL HIGH (ref 11.5–15.5)
RDW: 18 % — ABNORMAL HIGH (ref 11.5–15.5)
RDW: 18.2 % — ABNORMAL HIGH (ref 11.5–15.5)
RDW: 18.4 % — ABNORMAL HIGH (ref 11.5–15.5)
RDW: 18.4 % — ABNORMAL HIGH (ref 11.5–15.5)
RDW: 18.4 % — ABNORMAL HIGH (ref 11.5–15.5)
WBC: 16.6 10*3/uL — ABNORMAL HIGH (ref 4.0–10.5)
WBC: 17.9 10*3/uL — ABNORMAL HIGH (ref 4.0–10.5)
WBC: 20.5 10*3/uL — ABNORMAL HIGH (ref 4.0–10.5)
WBC: 21.4 10*3/uL — ABNORMAL HIGH (ref 4.0–10.5)
WBC: 24.7 10*3/uL — ABNORMAL HIGH (ref 4.0–10.5)
WBC: 25.5 10*3/uL — ABNORMAL HIGH (ref 4.0–10.5)
WBC: 28.2 10*3/uL — ABNORMAL HIGH (ref 4.0–10.5)
WBC: 30.5 10*3/uL — ABNORMAL HIGH (ref 4.0–10.5)

## 2010-12-08 LAB — CROSSMATCH: Antibody Screen: NEGATIVE

## 2010-12-08 LAB — RENAL FUNCTION PANEL
Albumin: 1.7 g/dL — ABNORMAL LOW (ref 3.5–5.2)
Albumin: 1.7 g/dL — ABNORMAL LOW (ref 3.5–5.2)
Albumin: 1.7 g/dL — ABNORMAL LOW (ref 3.5–5.2)
Albumin: 1.7 g/dL — ABNORMAL LOW (ref 3.5–5.2)
Albumin: 1.8 g/dL — ABNORMAL LOW (ref 3.5–5.2)
BUN: 10 mg/dL (ref 6–23)
BUN: 14 mg/dL (ref 6–23)
BUN: 21 mg/dL (ref 6–23)
BUN: 21 mg/dL (ref 6–23)
BUN: 26 mg/dL — ABNORMAL HIGH (ref 6–23)
BUN: 28 mg/dL — ABNORMAL HIGH (ref 6–23)
CO2: 23 mEq/L (ref 19–32)
CO2: 25 mEq/L (ref 19–32)
CO2: 26 mEq/L (ref 19–32)
CO2: 27 mEq/L (ref 19–32)
Calcium: 8.7 mg/dL (ref 8.4–10.5)
Calcium: 8.8 mg/dL (ref 8.4–10.5)
Calcium: 9 mg/dL (ref 8.4–10.5)
Calcium: 9.1 mg/dL (ref 8.4–10.5)
Chloride: 95 mEq/L — ABNORMAL LOW (ref 96–112)
Chloride: 95 mEq/L — ABNORMAL LOW (ref 96–112)
Chloride: 95 mEq/L — ABNORMAL LOW (ref 96–112)
Chloride: 97 mEq/L (ref 96–112)
Chloride: 97 mEq/L (ref 96–112)
Creatinine, Ser: 3.65 mg/dL — ABNORMAL HIGH (ref 0.4–1.2)
Creatinine, Ser: 5.28 mg/dL — ABNORMAL HIGH (ref 0.4–1.2)
Creatinine, Ser: 5.63 mg/dL — ABNORMAL HIGH (ref 0.4–1.2)
Creatinine, Ser: 6.81 mg/dL — ABNORMAL HIGH (ref 0.4–1.2)
GFR calc Af Amer: 10 mL/min — ABNORMAL LOW (ref >60)
GFR calc Af Amer: 11 mL/min — ABNORMAL LOW (ref >60)
GFR calc Af Amer: 7 mL/min — ABNORMAL LOW (ref >60)
GFR calc Af Amer: 8 mL/min — ABNORMAL LOW (ref >60)
GFR calc non Af Amer: 6 mL/min — ABNORMAL LOW (ref >60)
GFR calc non Af Amer: 7 mL/min — ABNORMAL LOW (ref >60)
GFR calc non Af Amer: 8 mL/min — ABNORMAL LOW (ref >60)
GFR calc non Af Amer: 9 mL/min — ABNORMAL LOW (ref >60)
Glucose, Bld: 116 mg/dL — ABNORMAL HIGH (ref 70–99)
Glucose, Bld: 133 mg/dL — ABNORMAL HIGH (ref 70–99)
Glucose, Bld: 151 mg/dL — ABNORMAL HIGH (ref 70–99)
Glucose, Bld: 160 mg/dL — ABNORMAL HIGH (ref 70–99)
Glucose, Bld: 172 mg/dL — ABNORMAL HIGH (ref 70–99)
Phosphorus: 4.9 mg/dL — ABNORMAL HIGH (ref 2.3–4.6)
Phosphorus: 5 mg/dL — ABNORMAL HIGH (ref 2.3–4.6)
Phosphorus: 5.1 mg/dL — ABNORMAL HIGH (ref 2.3–4.6)
Phosphorus: 5.8 mg/dL — ABNORMAL HIGH (ref 2.3–4.6)
Phosphorus: 6.2 mg/dL — ABNORMAL HIGH (ref 2.3–4.6)
Potassium: 3.3 mEq/L — ABNORMAL LOW (ref 3.5–5.1)
Potassium: 3.7 mEq/L (ref 3.5–5.1)
Potassium: 3.9 mEq/L (ref 3.5–5.1)
Potassium: 4.3 mEq/L (ref 3.5–5.1)
Sodium: 131 mEq/L — ABNORMAL LOW (ref 135–145)
Sodium: 132 mEq/L — ABNORMAL LOW (ref 135–145)
Sodium: 132 mEq/L — ABNORMAL LOW (ref 135–145)
Sodium: 132 mEq/L — ABNORMAL LOW (ref 135–145)
Sodium: 135 mEq/L (ref 135–145)

## 2010-12-08 LAB — GLUCOSE, CAPILLARY
Glucose-Capillary: 105 mg/dL — ABNORMAL HIGH (ref 70–99)
Glucose-Capillary: 108 mg/dL — ABNORMAL HIGH (ref 70–99)
Glucose-Capillary: 111 mg/dL — ABNORMAL HIGH (ref 70–99)
Glucose-Capillary: 112 mg/dL — ABNORMAL HIGH (ref 70–99)
Glucose-Capillary: 115 mg/dL — ABNORMAL HIGH (ref 70–99)
Glucose-Capillary: 117 mg/dL — ABNORMAL HIGH (ref 70–99)
Glucose-Capillary: 118 mg/dL — ABNORMAL HIGH (ref 70–99)
Glucose-Capillary: 118 mg/dL — ABNORMAL HIGH (ref 70–99)
Glucose-Capillary: 126 mg/dL — ABNORMAL HIGH (ref 70–99)
Glucose-Capillary: 126 mg/dL — ABNORMAL HIGH (ref 70–99)
Glucose-Capillary: 138 mg/dL — ABNORMAL HIGH (ref 70–99)
Glucose-Capillary: 139 mg/dL — ABNORMAL HIGH (ref 70–99)
Glucose-Capillary: 143 mg/dL — ABNORMAL HIGH (ref 70–99)
Glucose-Capillary: 145 mg/dL — ABNORMAL HIGH (ref 70–99)
Glucose-Capillary: 149 mg/dL — ABNORMAL HIGH (ref 70–99)
Glucose-Capillary: 154 mg/dL — ABNORMAL HIGH (ref 70–99)
Glucose-Capillary: 154 mg/dL — ABNORMAL HIGH (ref 70–99)
Glucose-Capillary: 161 mg/dL — ABNORMAL HIGH (ref 70–99)
Glucose-Capillary: 162 mg/dL — ABNORMAL HIGH (ref 70–99)
Glucose-Capillary: 163 mg/dL — ABNORMAL HIGH (ref 70–99)
Glucose-Capillary: 167 mg/dL — ABNORMAL HIGH (ref 70–99)
Glucose-Capillary: 175 mg/dL — ABNORMAL HIGH (ref 70–99)
Glucose-Capillary: 179 mg/dL — ABNORMAL HIGH (ref 70–99)
Glucose-Capillary: 180 mg/dL — ABNORMAL HIGH (ref 70–99)
Glucose-Capillary: 187 mg/dL — ABNORMAL HIGH (ref 70–99)
Glucose-Capillary: 188 mg/dL — ABNORMAL HIGH (ref 70–99)
Glucose-Capillary: 192 mg/dL — ABNORMAL HIGH (ref 70–99)
Glucose-Capillary: 199 mg/dL — ABNORMAL HIGH (ref 70–99)
Glucose-Capillary: 201 mg/dL — ABNORMAL HIGH (ref 70–99)
Glucose-Capillary: 210 mg/dL — ABNORMAL HIGH (ref 70–99)
Glucose-Capillary: 22 mg/dL — CL (ref 70–99)
Glucose-Capillary: 232 mg/dL — ABNORMAL HIGH (ref 70–99)
Glucose-Capillary: 257 mg/dL — ABNORMAL HIGH (ref 70–99)
Glucose-Capillary: 29 mg/dL — CL (ref 70–99)
Glucose-Capillary: 44 mg/dL — ABNORMAL LOW (ref 70–99)
Glucose-Capillary: 48 mg/dL — ABNORMAL LOW (ref 70–99)
Glucose-Capillary: 49 mg/dL — ABNORMAL LOW (ref 70–99)
Glucose-Capillary: 55 mg/dL — ABNORMAL LOW (ref 70–99)
Glucose-Capillary: 58 mg/dL — ABNORMAL LOW (ref 70–99)
Glucose-Capillary: 80 mg/dL (ref 70–99)
Glucose-Capillary: 82 mg/dL (ref 70–99)

## 2010-12-08 LAB — CLOSTRIDIUM DIFFICILE EIA

## 2010-12-08 LAB — COMPREHENSIVE METABOLIC PANEL
ALT: <8 U/L (ref 0–35)
AST: 15 U/L (ref 0–37)
Alkaline Phosphatase: 108 U/L (ref 39–117)
CO2: 21 mEq/L (ref 19–32)
Calcium: 8.8 mg/dL (ref 8.4–10.5)
Chloride: 96 mEq/L (ref 96–112)
GFR calc Af Amer: 10 mL/min — ABNORMAL LOW (ref >60)
GFR calc non Af Amer: 8 mL/min — ABNORMAL LOW (ref >60)
Glucose, Bld: 110 mg/dL — ABNORMAL HIGH (ref 70–99)
Potassium: 5 mEq/L (ref 3.5–5.1)
Sodium: 129 mEq/L — ABNORMAL LOW (ref 135–145)
Total Bilirubin: 0.5 mg/dL (ref 0.3–1.2)

## 2010-12-08 LAB — DIFFERENTIAL
Basophils Absolute: 0 10*3/uL (ref 0.0–0.1)
Basophils Absolute: 0 10*3/uL (ref 0.0–0.1)
Basophils Relative: 0 % (ref 0–1)
Basophils Relative: 0 % (ref 0–1)
Basophils Relative: 0 % (ref 0–1)
Eosinophils Absolute: 0 10*3/uL (ref 0.0–0.7)
Eosinophils Absolute: 0.2 10*3/uL (ref 0.0–0.7)
Eosinophils Absolute: 0.3 10*3/uL (ref 0.0–0.7)
Eosinophils Relative: 0 % (ref 0–5)
Eosinophils Relative: 1 % (ref 0–5)
Lymphocytes Relative: 13 % (ref 12–46)
Lymphs Abs: 2.9 10*3/uL (ref 0.7–4.0)
Lymphs Abs: 3.5 10*3/uL (ref 0.7–4.0)
Monocytes Absolute: 1.9 10*3/uL — ABNORMAL HIGH (ref 0.1–1.0)
Monocytes Absolute: 1.9 10*3/uL — ABNORMAL HIGH (ref 0.1–1.0)
Monocytes Absolute: 2.6 10*3/uL — ABNORMAL HIGH (ref 0.1–1.0)
Monocytes Relative: 9 % (ref 3–12)
Neutro Abs: 14.9 10*3/uL — ABNORMAL HIGH (ref 1.7–7.7)
Neutro Abs: 16.6 10*3/uL — ABNORMAL HIGH (ref 1.7–7.7)
Neutrophils Relative %: 78 % — ABNORMAL HIGH (ref 43–77)
Neutrophils Relative %: 83 % — ABNORMAL HIGH (ref 43–77)

## 2010-12-08 LAB — CULTURE, BLOOD (ROUTINE X 2): Culture: NO GROWTH

## 2010-12-08 LAB — BASIC METABOLIC PANEL
BUN: 33 mg/dL — ABNORMAL HIGH (ref 6–23)
CO2: 19 mEq/L (ref 19–32)
CO2: 20 mEq/L (ref 19–32)
CO2: 26 mEq/L (ref 19–32)
Calcium: 9.1 mg/dL (ref 8.4–10.5)
Chloride: 102 mEq/L (ref 96–112)
Chloride: 96 mEq/L (ref 96–112)
Chloride: 96 mEq/L (ref 96–112)
Creatinine, Ser: 4.43 mg/dL — ABNORMAL HIGH (ref 0.4–1.2)
Creatinine, Ser: 4.49 mg/dL — ABNORMAL HIGH (ref 0.4–1.2)
Creatinine, Ser: 7.06 mg/dL — ABNORMAL HIGH (ref 0.4–1.2)
Creatinine, Ser: 7.13 mg/dL — ABNORMAL HIGH (ref 0.4–1.2)
GFR calc Af Amer: 13 mL/min — ABNORMAL LOW (ref >60)
GFR calc Af Amer: 13 mL/min — ABNORMAL LOW (ref >60)
GFR calc Af Amer: 8 mL/min — ABNORMAL LOW (ref >60)
GFR calc non Af Amer: 11 mL/min — ABNORMAL LOW (ref >60)
Glucose, Bld: 122 mg/dL — ABNORMAL HIGH (ref 70–99)
Glucose, Bld: 145 mg/dL — ABNORMAL HIGH (ref 70–99)
Potassium: 3.7 mEq/L (ref 3.5–5.1)
Potassium: 4.4 mEq/L (ref 3.5–5.1)
Sodium: 130 mEq/L — ABNORMAL LOW (ref 135–145)
Sodium: 134 mEq/L — ABNORMAL LOW (ref 135–145)

## 2010-12-08 LAB — ANAEROBIC CULTURE: Gram Stain: NONE SEEN

## 2010-12-08 LAB — POCT I-STAT 3, ART BLOOD GAS (G3+)
TCO2: 28 mmol/L (ref 0–100)
pCO2 arterial: 51.2 mmHg — ABNORMAL HIGH (ref 35.0–45.0)
pH, Arterial: 7.321 — ABNORMAL LOW (ref 7.350–7.400)

## 2010-12-08 LAB — POCT I-STAT 4, (NA,K, GLUC, HGB,HCT)
Glucose, Bld: 144 mg/dL — ABNORMAL HIGH (ref 70–99)
HCT: 30 % — ABNORMAL LOW (ref 36.0–46.0)

## 2010-12-08 LAB — GLUCOSE, RANDOM: Glucose, Bld: 168 mg/dL — ABNORMAL HIGH (ref 70–99)

## 2010-12-08 LAB — SEDIMENTATION RATE: Sed Rate: 81 mm/hr — ABNORMAL HIGH (ref 0–22)

## 2010-12-08 LAB — PROTIME-INR: Prothrombin Time: 15.1 seconds (ref 11.6–15.2)

## 2010-12-08 LAB — WOUND CULTURE

## 2010-12-08 LAB — VANCOMYCIN, RANDOM: Vancomycin Rm: 20.4 ug/mL

## 2010-12-09 LAB — POCT I-STAT, CHEM 8
HCT: 50 % — ABNORMAL HIGH (ref 36.0–46.0)
Hemoglobin: 17 g/dL — ABNORMAL HIGH (ref 12.0–15.0)
Potassium: 4.2 mEq/L (ref 3.5–5.1)
Sodium: 136 mEq/L (ref 135–145)
TCO2: 27 mmol/L (ref 0–100)

## 2010-12-09 LAB — GLUCOSE, CAPILLARY: Glucose-Capillary: 130 mg/dL — ABNORMAL HIGH (ref 70–99)

## 2010-12-10 ENCOUNTER — Other Ambulatory Visit (HOSPITAL_COMMUNITY): Payer: Self-pay | Admitting: Nephrology

## 2010-12-10 DIAGNOSIS — N186 End stage renal disease: Secondary | ICD-10-CM

## 2010-12-10 LAB — POCT I-STAT, CHEM 8
Chloride: 100 mEq/L (ref 96–112)
Glucose, Bld: 77 mg/dL (ref 70–99)
HCT: 30 % — ABNORMAL LOW (ref 36.0–46.0)
Potassium: 3 mEq/L — ABNORMAL LOW (ref 3.5–5.1)

## 2010-12-10 LAB — CBC
HCT: 28.9 % — ABNORMAL LOW (ref 36.0–46.0)
Hemoglobin: 9.6 g/dL — ABNORMAL LOW (ref 12.0–15.0)
MCV: 89.1 fL (ref 78.0–100.0)
Platelets: 386 10*3/uL (ref 150–400)
RDW: 23 % — ABNORMAL HIGH (ref 11.5–15.5)
WBC: 9.4 10*3/uL (ref 4.0–10.5)

## 2010-12-10 LAB — DIFFERENTIAL
Eosinophils Absolute: 0.5 10*3/uL (ref 0.0–0.7)
Eosinophils Relative: 6 % — ABNORMAL HIGH (ref 0–5)
Lymphs Abs: 1.9 10*3/uL (ref 0.7–4.0)
Monocytes Absolute: 1 10*3/uL (ref 0.1–1.0)

## 2010-12-10 LAB — GLUCOSE, CAPILLARY
Glucose-Capillary: 166 mg/dL — ABNORMAL HIGH (ref 70–99)
Glucose-Capillary: 195 mg/dL — ABNORMAL HIGH (ref 70–99)
Glucose-Capillary: 217 mg/dL — ABNORMAL HIGH (ref 70–99)
Glucose-Capillary: 75 mg/dL (ref 70–99)

## 2010-12-11 LAB — BASIC METABOLIC PANEL
BUN: 14 mg/dL (ref 6–23)
BUN: 30 mg/dL — ABNORMAL HIGH (ref 6–23)
BUN: 6 mg/dL (ref 6–23)
BUN: 9 mg/dL (ref 6–23)
CO2: 21 mEq/L (ref 19–32)
CO2: 27 mEq/L (ref 19–32)
CO2: 27 mEq/L (ref 19–32)
Calcium: 8.6 mg/dL (ref 8.4–10.5)
Calcium: 9 mg/dL (ref 8.4–10.5)
Calcium: 9 mg/dL (ref 8.4–10.5)
Chloride: 100 mEq/L (ref 96–112)
Chloride: 98 mEq/L (ref 96–112)
Chloride: 99 mEq/L (ref 96–112)
Creatinine, Ser: 3.82 mg/dL — ABNORMAL HIGH (ref 0.4–1.2)
Creatinine, Ser: 4.11 mg/dL — ABNORMAL HIGH (ref 0.4–1.2)
Creatinine, Ser: 6.27 mg/dL — ABNORMAL HIGH (ref 0.4–1.2)
GFR calc Af Amer: 12 mL/min — ABNORMAL LOW (ref >60)
GFR calc Af Amer: 15 mL/min — ABNORMAL LOW (ref >60)
GFR calc Af Amer: 16 mL/min — ABNORMAL LOW (ref >60)
GFR calc Af Amer: 9 mL/min — ABNORMAL LOW (ref >60)
GFR calc non Af Amer: 12 mL/min — ABNORMAL LOW (ref >60)
GFR calc non Af Amer: 13 mL/min — ABNORMAL LOW (ref >60)
GFR calc non Af Amer: 6 mL/min — ABNORMAL LOW (ref >60)
GFR calc non Af Amer: 7 mL/min — ABNORMAL LOW (ref >60)
Glucose, Bld: 109 mg/dL — ABNORMAL HIGH (ref 70–99)
Glucose, Bld: 162 mg/dL — ABNORMAL HIGH (ref 70–99)
Glucose, Bld: 188 mg/dL — ABNORMAL HIGH (ref 70–99)
Potassium: 3.5 mEq/L (ref 3.5–5.1)
Potassium: 3.7 mEq/L (ref 3.5–5.1)
Potassium: 3.7 mEq/L (ref 3.5–5.1)
Potassium: 3.8 mEq/L (ref 3.5–5.1)
Sodium: 130 mEq/L — ABNORMAL LOW (ref 135–145)
Sodium: 132 mEq/L — ABNORMAL LOW (ref 135–145)
Sodium: 132 mEq/L — ABNORMAL LOW (ref 135–145)
Sodium: 134 mEq/L — ABNORMAL LOW (ref 135–145)

## 2010-12-11 LAB — CBC
HCT: 21.5 % — ABNORMAL LOW (ref 36.0–46.0)
HCT: 22.8 % — ABNORMAL LOW (ref 36.0–46.0)
HCT: 24.2 % — ABNORMAL LOW (ref 36.0–46.0)
HCT: 25.2 % — ABNORMAL LOW (ref 36.0–46.0)
HCT: 26.1 % — ABNORMAL LOW (ref 36.0–46.0)
HCT: 29.4 % — ABNORMAL LOW (ref 36.0–46.0)
HCT: 29.8 % — ABNORMAL LOW (ref 36.0–46.0)
HCT: 30 % — ABNORMAL LOW (ref 36.0–46.0)
HCT: 30 % — ABNORMAL LOW (ref 36.0–46.0)
HCT: 30.8 % — ABNORMAL LOW (ref 36.0–46.0)
HCT: 32.2 % — ABNORMAL LOW (ref 36.0–46.0)
HCT: 25.8 % — ABNORMAL LOW (ref 36.0–46.0)
HCT: 29.4 % — ABNORMAL LOW (ref 36.0–46.0)
HCT: 30.8 % — ABNORMAL LOW (ref 36.0–46.0)
Hemoglobin: 10.1 g/dL — ABNORMAL LOW (ref 12.0–15.0)
Hemoglobin: 10.2 g/dL — ABNORMAL LOW (ref 12.0–15.0)
Hemoglobin: 10.4 g/dL — ABNORMAL LOW (ref 12.0–15.0)
Hemoglobin: 7.8 g/dL — CL (ref 12.0–15.0)
Hemoglobin: 8.7 g/dL — ABNORMAL LOW (ref 12.0–15.0)
Hemoglobin: 8.7 g/dL — ABNORMAL LOW (ref 12.0–15.0)
Hemoglobin: 10.5 g/dL — ABNORMAL LOW (ref 12.0–15.0)
Hemoglobin: 10.7 g/dL — ABNORMAL LOW (ref 12.0–15.0)
Hemoglobin: 7.6 g/dL — CL (ref 12.0–15.0)
Hemoglobin: 8.6 g/dL — ABNORMAL LOW (ref 12.0–15.0)
Hemoglobin: 9.9 g/dL — ABNORMAL LOW (ref 12.0–15.0)
MCHC: 32.9 g/dL (ref 30.0–36.0)
MCHC: 33 g/dL (ref 30.0–36.0)
MCHC: 33.3 g/dL (ref 30.0–36.0)
MCHC: 34 g/dL (ref 30.0–36.0)
MCHC: 34.2 g/dL (ref 30.0–36.0)
MCHC: 34.5 g/dL (ref 30.0–36.0)
MCHC: 34.5 g/dL (ref 30.0–36.0)
MCHC: 35 g/dL (ref 30.0–36.0)
MCHC: 34.2 g/dL (ref 30.0–36.0)
MCHC: 34.4 g/dL (ref 30.0–36.0)
MCV: 82.9 fL (ref 78.0–100.0)
MCV: 83.6 fL (ref 78.0–100.0)
MCV: 84 fL (ref 78.0–100.0)
MCV: 84.5 fL (ref 78.0–100.0)
MCV: 84.6 fL (ref 78.0–100.0)
MCV: 84.7 fL (ref 78.0–100.0)
MCV: 84.9 fL (ref 78.0–100.0)
MCV: 85.3 fL (ref 78.0–100.0)
MCV: 85.6 fL (ref 78.0–100.0)
MCV: 85.6 fL (ref 78.0–100.0)
MCV: 83 fL (ref 78.0–100.0)
MCV: 83.6 fL (ref 78.0–100.0)
Platelets: 137 10*3/uL — ABNORMAL LOW (ref 150–400)
Platelets: 201 10*3/uL (ref 150–400)
Platelets: 248 10*3/uL (ref 150–400)
Platelets: 259 10*3/uL (ref 150–400)
Platelets: 317 10*3/uL (ref 150–400)
Platelets: 329 10*3/uL (ref 150–400)
Platelets: 330 10*3/uL (ref 150–400)
Platelets: 169 10*3/uL (ref 150–400)
Platelets: 170 10*3/uL (ref 150–400)
Platelets: 196 10*3/uL (ref 150–400)
Platelets: 199 10*3/uL (ref 150–400)
Platelets: 315 10*3/uL (ref 150–400)
RBC: 2.51 MIL/uL — ABNORMAL LOW (ref 3.87–5.11)
RBC: 2.69 MIL/uL — ABNORMAL LOW (ref 3.87–5.11)
RBC: 3.07 MIL/uL — ABNORMAL LOW (ref 3.87–5.11)
RBC: 3.48 MIL/uL — ABNORMAL LOW (ref 3.87–5.11)
RBC: 3.5 MIL/uL — ABNORMAL LOW (ref 3.87–5.11)
RBC: 2.93 MIL/uL — ABNORMAL LOW (ref 3.87–5.11)
RBC: 3.11 MIL/uL — ABNORMAL LOW (ref 3.87–5.11)
RBC: 3.49 MIL/uL — ABNORMAL LOW (ref 3.87–5.11)
RBC: 3.71 MIL/uL — ABNORMAL LOW (ref 3.87–5.11)
RBC: 3.75 MIL/uL — ABNORMAL LOW (ref 3.87–5.11)
RBC: 4.29 MIL/uL (ref 3.87–5.11)
RBC: 4.33 MIL/uL (ref 3.87–5.11)
RDW: 17.3 % — ABNORMAL HIGH (ref 11.5–15.5)
RDW: 17.5 % — ABNORMAL HIGH (ref 11.5–15.5)
RDW: 17.6 % — ABNORMAL HIGH (ref 11.5–15.5)
RDW: 19.6 % — ABNORMAL HIGH (ref 11.5–15.5)
RDW: 16.8 % — ABNORMAL HIGH (ref 11.5–15.5)
RDW: 16.9 % — ABNORMAL HIGH (ref 11.5–15.5)
RDW: 16.9 % — ABNORMAL HIGH (ref 11.5–15.5)
WBC: 14.9 10*3/uL — ABNORMAL HIGH (ref 4.0–10.5)
WBC: 15.7 10*3/uL — ABNORMAL HIGH (ref 4.0–10.5)
WBC: 16 10*3/uL — ABNORMAL HIGH (ref 4.0–10.5)
WBC: 19.3 10*3/uL — ABNORMAL HIGH (ref 4.0–10.5)
WBC: 19.5 10*3/uL — ABNORMAL HIGH (ref 4.0–10.5)
WBC: 21.8 10*3/uL — ABNORMAL HIGH (ref 4.0–10.5)
WBC: 22.6 10*3/uL — ABNORMAL HIGH (ref 4.0–10.5)
WBC: 27.1 10*3/uL — ABNORMAL HIGH (ref 4.0–10.5)
WBC: 27.8 10*3/uL — ABNORMAL HIGH (ref 4.0–10.5)
WBC: 28.8 10*3/uL — ABNORMAL HIGH (ref 4.0–10.5)
WBC: 13.9 10*3/uL — ABNORMAL HIGH (ref 4.0–10.5)
WBC: 17.4 10*3/uL — ABNORMAL HIGH (ref 4.0–10.5)
WBC: 18.4 10*3/uL — ABNORMAL HIGH (ref 4.0–10.5)
WBC: 19.5 10*3/uL — ABNORMAL HIGH (ref 4.0–10.5)
WBC: 24.2 10*3/uL — ABNORMAL HIGH (ref 4.0–10.5)
WBC: 29.5 10*3/uL — ABNORMAL HIGH (ref 4.0–10.5)
WBC: 31.9 10*3/uL — ABNORMAL HIGH (ref 4.0–10.5)

## 2010-12-11 LAB — GLUCOSE, CAPILLARY
Glucose-Capillary: 102 mg/dL — ABNORMAL HIGH (ref 70–99)
Glucose-Capillary: 119 mg/dL — ABNORMAL HIGH (ref 70–99)
Glucose-Capillary: 121 mg/dL — ABNORMAL HIGH (ref 70–99)
Glucose-Capillary: 133 mg/dL — ABNORMAL HIGH (ref 70–99)
Glucose-Capillary: 134 mg/dL — ABNORMAL HIGH (ref 70–99)
Glucose-Capillary: 155 mg/dL — ABNORMAL HIGH (ref 70–99)
Glucose-Capillary: 159 mg/dL — ABNORMAL HIGH (ref 70–99)
Glucose-Capillary: 164 mg/dL — ABNORMAL HIGH (ref 70–99)
Glucose-Capillary: 192 mg/dL — ABNORMAL HIGH (ref 70–99)
Glucose-Capillary: 196 mg/dL — ABNORMAL HIGH (ref 70–99)
Glucose-Capillary: 203 mg/dL — ABNORMAL HIGH (ref 70–99)
Glucose-Capillary: 209 mg/dL — ABNORMAL HIGH (ref 70–99)
Glucose-Capillary: 241 mg/dL — ABNORMAL HIGH (ref 70–99)
Glucose-Capillary: 269 mg/dL — ABNORMAL HIGH (ref 70–99)
Glucose-Capillary: 284 mg/dL — ABNORMAL HIGH (ref 70–99)
Glucose-Capillary: 34 mg/dL — CL (ref 70–99)
Glucose-Capillary: 56 mg/dL — ABNORMAL LOW (ref 70–99)
Glucose-Capillary: 81 mg/dL (ref 70–99)
Glucose-Capillary: 84 mg/dL (ref 70–99)
Glucose-Capillary: 98 mg/dL (ref 70–99)
Glucose-Capillary: 99 mg/dL (ref 70–99)
Glucose-Capillary: 100 mg/dL — ABNORMAL HIGH (ref 70–99)
Glucose-Capillary: 105 mg/dL — ABNORMAL HIGH (ref 70–99)
Glucose-Capillary: 106 mg/dL — ABNORMAL HIGH (ref 70–99)
Glucose-Capillary: 109 mg/dL — ABNORMAL HIGH (ref 70–99)
Glucose-Capillary: 114 mg/dL — ABNORMAL HIGH (ref 70–99)
Glucose-Capillary: 118 mg/dL — ABNORMAL HIGH (ref 70–99)
Glucose-Capillary: 127 mg/dL — ABNORMAL HIGH (ref 70–99)
Glucose-Capillary: 128 mg/dL — ABNORMAL HIGH (ref 70–99)
Glucose-Capillary: 134 mg/dL — ABNORMAL HIGH (ref 70–99)
Glucose-Capillary: 160 mg/dL — ABNORMAL HIGH (ref 70–99)
Glucose-Capillary: 168 mg/dL — ABNORMAL HIGH (ref 70–99)
Glucose-Capillary: 171 mg/dL — ABNORMAL HIGH (ref 70–99)
Glucose-Capillary: 174 mg/dL — ABNORMAL HIGH (ref 70–99)
Glucose-Capillary: 185 mg/dL — ABNORMAL HIGH (ref 70–99)
Glucose-Capillary: 199 mg/dL — ABNORMAL HIGH (ref 70–99)
Glucose-Capillary: 208 mg/dL — ABNORMAL HIGH (ref 70–99)
Glucose-Capillary: 221 mg/dL — ABNORMAL HIGH (ref 70–99)
Glucose-Capillary: 223 mg/dL — ABNORMAL HIGH (ref 70–99)
Glucose-Capillary: 241 mg/dL — ABNORMAL HIGH (ref 70–99)
Glucose-Capillary: 257 mg/dL — ABNORMAL HIGH (ref 70–99)
Glucose-Capillary: 278 mg/dL — ABNORMAL HIGH (ref 70–99)
Glucose-Capillary: 281 mg/dL — ABNORMAL HIGH (ref 70–99)
Glucose-Capillary: 293 mg/dL — ABNORMAL HIGH (ref 70–99)

## 2010-12-11 LAB — CLOSTRIDIUM DIFFICILE EIA
C difficile Toxins A+B, EIA: NEGATIVE
C difficile Toxins A+B, EIA: NEGATIVE
C difficile Toxins A+B, EIA: NEGATIVE

## 2010-12-11 LAB — RENAL FUNCTION PANEL
Albumin: 2.1 g/dL — ABNORMAL LOW (ref 3.5–5.2)
Albumin: 2.1 g/dL — ABNORMAL LOW (ref 3.5–5.2)
Albumin: 2.4 g/dL — ABNORMAL LOW (ref 3.5–5.2)
Albumin: 2.4 g/dL — ABNORMAL LOW (ref 3.5–5.2)
Albumin: 2 g/dL — ABNORMAL LOW (ref 3.5–5.2)
Albumin: 2.2 g/dL — ABNORMAL LOW (ref 3.5–5.2)
Albumin: 2.3 g/dL — ABNORMAL LOW (ref 3.5–5.2)
Albumin: 2.4 g/dL — ABNORMAL LOW (ref 3.5–5.2)
Albumin: 2.7 g/dL — ABNORMAL LOW (ref 3.5–5.2)
BUN: 14 mg/dL (ref 6–23)
BUN: 18 mg/dL (ref 6–23)
BUN: 19 mg/dL (ref 6–23)
BUN: 19 mg/dL (ref 6–23)
BUN: 19 mg/dL (ref 6–23)
BUN: 14 mg/dL (ref 6–23)
BUN: 18 mg/dL (ref 6–23)
BUN: 19 mg/dL (ref 6–23)
BUN: 22 mg/dL (ref 6–23)
BUN: 27 mg/dL — ABNORMAL HIGH (ref 6–23)
CO2: 24 mEq/L (ref 19–32)
CO2: 25 mEq/L (ref 19–32)
CO2: 27 mEq/L (ref 19–32)
Calcium: 8.8 mg/dL (ref 8.4–10.5)
Calcium: 8.9 mg/dL (ref 8.4–10.5)
Calcium: 8.9 mg/dL (ref 8.4–10.5)
Calcium: 9 mg/dL (ref 8.4–10.5)
Calcium: 8.8 mg/dL (ref 8.4–10.5)
Calcium: 9.6 mg/dL (ref 8.4–10.5)
Chloride: 92 mEq/L — ABNORMAL LOW (ref 96–112)
Chloride: 95 mEq/L — ABNORMAL LOW (ref 96–112)
Chloride: 95 mEq/L — ABNORMAL LOW (ref 96–112)
Chloride: 99 mEq/L (ref 96–112)
Chloride: 101 mEq/L (ref 96–112)
Chloride: 94 mEq/L — ABNORMAL LOW (ref 96–112)
Chloride: 98 mEq/L (ref 96–112)
Chloride: 99 mEq/L (ref 96–112)
Creatinine, Ser: 5.91 mg/dL — ABNORMAL HIGH (ref 0.4–1.2)
Creatinine, Ser: 6.37 mg/dL — ABNORMAL HIGH (ref 0.4–1.2)
Creatinine, Ser: 6.62 mg/dL — ABNORMAL HIGH (ref 0.4–1.2)
Creatinine, Ser: 7.03 mg/dL — ABNORMAL HIGH (ref 0.4–1.2)
Creatinine, Ser: 8.35 mg/dL — ABNORMAL HIGH (ref 0.4–1.2)
Creatinine, Ser: 4.27 mg/dL — ABNORMAL HIGH (ref 0.4–1.2)
GFR calc Af Amer: 10 mL/min — ABNORMAL LOW (ref >60)
GFR calc Af Amer: 6 mL/min — ABNORMAL LOW (ref >60)
GFR calc Af Amer: 8 mL/min — ABNORMAL LOW (ref >60)
GFR calc Af Amer: 14 mL/min — ABNORMAL LOW (ref >60)
GFR calc non Af Amer: 5 mL/min — ABNORMAL LOW (ref >60)
GFR calc non Af Amer: 7 mL/min — ABNORMAL LOW (ref >60)
GFR calc non Af Amer: 8 mL/min — ABNORMAL LOW (ref >60)
GFR calc non Af Amer: 12 mL/min — ABNORMAL LOW (ref >60)
Glucose, Bld: 136 mg/dL — ABNORMAL HIGH (ref 70–99)
Glucose, Bld: 248 mg/dL — ABNORMAL HIGH (ref 70–99)
Glucose, Bld: 112 mg/dL — ABNORMAL HIGH (ref 70–99)
Glucose, Bld: 127 mg/dL — ABNORMAL HIGH (ref 70–99)
Glucose, Bld: 144 mg/dL — ABNORMAL HIGH (ref 70–99)
Glucose, Bld: 207 mg/dL — ABNORMAL HIGH (ref 70–99)
Glucose, Bld: 252 mg/dL — ABNORMAL HIGH (ref 70–99)
Phosphorus: 5.1 mg/dL — ABNORMAL HIGH (ref 2.3–4.6)
Phosphorus: 4.3 mg/dL (ref 2.3–4.6)
Phosphorus: 4.3 mg/dL (ref 2.3–4.6)
Phosphorus: 4.5 mg/dL (ref 2.3–4.6)
Phosphorus: 4.6 mg/dL (ref 2.3–4.6)
Potassium: 2.9 mEq/L — ABNORMAL LOW (ref 3.5–5.1)
Potassium: 3.5 mEq/L (ref 3.5–5.1)
Potassium: 3.2 mEq/L — ABNORMAL LOW (ref 3.5–5.1)
Potassium: 3.4 mEq/L — ABNORMAL LOW (ref 3.5–5.1)
Potassium: 3.9 mEq/L (ref 3.5–5.1)
Potassium: 3.9 mEq/L (ref 3.5–5.1)
Potassium: 4 mEq/L (ref 3.5–5.1)
Potassium: 4.2 mEq/L (ref 3.5–5.1)
Sodium: 133 mEq/L — ABNORMAL LOW (ref 135–145)
Sodium: 132 mEq/L — ABNORMAL LOW (ref 135–145)
Sodium: 134 mEq/L — ABNORMAL LOW (ref 135–145)
Sodium: 134 mEq/L — ABNORMAL LOW (ref 135–145)
Sodium: 135 mEq/L (ref 135–145)
Sodium: 135 mEq/L (ref 135–145)
Sodium: 139 mEq/L (ref 135–145)

## 2010-12-11 LAB — FUNGUS CULTURE W SMEAR

## 2010-12-11 LAB — WET PREP, GENITAL: Yeast Wet Prep HPF POC: NONE SEEN

## 2010-12-11 LAB — TYPE AND SCREEN

## 2010-12-11 LAB — GRAM STAIN: Gram Stain: NONE SEEN

## 2010-12-11 LAB — POCT I-STAT, CHEM 8
BUN: 12 mg/dL (ref 6–23)
Creatinine, Ser: 5.3 mg/dL — ABNORMAL HIGH (ref 0.4–1.2)
Glucose, Bld: 226 mg/dL — ABNORMAL HIGH (ref 70–99)
Potassium: 3.2 mEq/L — ABNORMAL LOW (ref 3.5–5.1)
Sodium: 134 mEq/L — ABNORMAL LOW (ref 135–145)

## 2010-12-11 LAB — WOUND CULTURE: Culture: NO GROWTH

## 2010-12-11 LAB — ANAEROBIC CULTURE: Gram Stain: NONE SEEN

## 2010-12-11 LAB — CULTURE, BLOOD (ROUTINE X 2)
Culture: NO GROWTH
Culture: NO GROWTH

## 2010-12-11 LAB — HEPATIC FUNCTION PANEL
Albumin: 2.9 g/dL — ABNORMAL LOW (ref 3.5–5.2)
Total Bilirubin: 1.2 mg/dL (ref 0.3–1.2)
Total Protein: 8.1 g/dL (ref 6.0–8.3)

## 2010-12-11 LAB — DIFFERENTIAL
Eosinophils Absolute: 0.1 10*3/uL (ref 0.0–0.7)
Lymphs Abs: 2.3 10*3/uL (ref 0.7–4.0)
Neutro Abs: 13.7 10*3/uL — ABNORMAL HIGH (ref 1.7–7.7)
Neutrophils Relative %: 79 % — ABNORMAL HIGH (ref 43–77)

## 2010-12-11 LAB — POCT I-STAT 4, (NA,K, GLUC, HGB,HCT): HCT: 41 % (ref 36.0–46.0)

## 2010-12-11 LAB — TISSUE CULTURE

## 2010-12-11 LAB — GC/CHLAMYDIA PROBE AMP, GENITAL: Chlamydia, DNA Probe: NEGATIVE

## 2010-12-11 LAB — VANCOMYCIN, RANDOM: Vancomycin Rm: 26.4 ug/mL

## 2010-12-11 LAB — HIV ANTIBODY (ROUTINE TESTING W REFLEX): HIV: NONREACTIVE

## 2010-12-11 LAB — HEMOGLOBIN A1C
Hgb A1c MFr Bld: 10.1 % — ABNORMAL HIGH (ref 4.6–6.1)
Mean Plasma Glucose: 243 mg/dL

## 2010-12-11 LAB — PROTIME-INR
INR: 1.1 (ref 0.00–1.49)
Prothrombin Time: 14.1 seconds (ref 11.6–15.2)

## 2010-12-11 LAB — ABO/RH: ABO/RH(D): A POS

## 2010-12-11 LAB — PREPARE RBC (CROSSMATCH)

## 2010-12-11 LAB — CULTURE, ROUTINE-GENITAL: Culture: NORMAL

## 2010-12-15 LAB — DIFFERENTIAL
Basophils Absolute: 0 10*3/uL (ref 0.0–0.1)
Basophils Absolute: 0.1 10*3/uL (ref 0.0–0.1)
Basophils Relative: 0 % (ref 0–1)
Basophils Relative: 1 % (ref 0–1)
Eosinophils Absolute: 0 10*3/uL (ref 0.0–0.7)
Eosinophils Relative: 0 % (ref 0–5)
Eosinophils Relative: 1 % (ref 0–5)
Lymphocytes Relative: 17 % (ref 12–46)
Lymphocytes Relative: 18 % (ref 12–46)
Lymphocytes Relative: 23 % (ref 12–46)
Lymphs Abs: 1.8 10*3/uL (ref 0.7–4.0)
Lymphs Abs: 2.1 10*3/uL (ref 0.7–4.0)
Monocytes Absolute: 0.6 10*3/uL (ref 0.1–1.0)
Monocytes Relative: 2 % — ABNORMAL LOW (ref 3–12)
Monocytes Relative: 6 % (ref 3–12)
Neutro Abs: 8 10*3/uL — ABNORMAL HIGH (ref 1.7–7.7)
Neutro Abs: 8.4 10*3/uL — ABNORMAL HIGH (ref 1.7–7.7)
Neutro Abs: 9.3 10*3/uL — ABNORMAL HIGH (ref 1.7–7.7)
Neutrophils Relative %: 78 % — ABNORMAL HIGH (ref 43–77)
Neutrophils Relative %: 80 % — ABNORMAL HIGH (ref 43–77)

## 2010-12-15 LAB — COMPREHENSIVE METABOLIC PANEL
ALT: 10 U/L (ref 0–35)
AST: 23 U/L (ref 0–37)
Albumin: 3.5 g/dL (ref 3.5–5.2)
Alkaline Phosphatase: 117 U/L (ref 39–117)
BUN: 12 mg/dL (ref 6–23)
CO2: 28 mEq/L (ref 19–32)
Calcium: 9.7 mg/dL (ref 8.4–10.5)
Chloride: 96 mEq/L (ref 96–112)
Creatinine, Ser: 5.05 mg/dL — ABNORMAL HIGH (ref 0.4–1.2)
GFR calc Af Amer: 12 mL/min — ABNORMAL LOW (ref >60)
GFR calc non Af Amer: 10 mL/min — ABNORMAL LOW (ref >60)
Glucose, Bld: 310 mg/dL — ABNORMAL HIGH (ref 70–99)
Potassium: 4.4 mEq/L (ref 3.5–5.1)
Sodium: 136 mEq/L (ref 135–145)
Total Bilirubin: 0.2 mg/dL — ABNORMAL LOW (ref 0.3–1.2)
Total Protein: 8.2 g/dL (ref 6.0–8.3)

## 2010-12-15 LAB — CBC
HCT: 43.4 % (ref 36.0–46.0)
Hemoglobin: 14 g/dL (ref 12.0–15.0)
MCHC: 32.3 g/dL (ref 30.0–36.0)
MCV: 85.6 fL (ref 78.0–100.0)
MCV: 86.8 fL (ref 78.0–100.0)
Platelets: 216 10*3/uL (ref 150–400)
Platelets: 244 10*3/uL (ref 150–400)
RBC: 4.65 MIL/uL (ref 3.87–5.11)
RBC: 5 MIL/uL (ref 3.87–5.11)
RDW: 16.5 % — ABNORMAL HIGH (ref 11.5–15.5)
RDW: 17 % — ABNORMAL HIGH (ref 11.5–15.5)
WBC: 10.9 10*3/uL — ABNORMAL HIGH (ref 4.0–10.5)
WBC: 11.7 10*3/uL — ABNORMAL HIGH (ref 4.0–10.5)

## 2010-12-15 LAB — CULTURE, BLOOD (ROUTINE X 2): Culture: NO GROWTH

## 2010-12-15 LAB — GLUCOSE, CAPILLARY
Glucose-Capillary: 110 mg/dL — ABNORMAL HIGH (ref 70–99)
Glucose-Capillary: 198 mg/dL — ABNORMAL HIGH (ref 70–99)
Glucose-Capillary: 206 mg/dL — ABNORMAL HIGH (ref 70–99)
Glucose-Capillary: 280 mg/dL — ABNORMAL HIGH (ref 70–99)

## 2010-12-15 LAB — BASIC METABOLIC PANEL
BUN: 26 mg/dL — ABNORMAL HIGH (ref 6–23)
Calcium: 8.8 mg/dL (ref 8.4–10.5)
Calcium: 9.2 mg/dL (ref 8.4–10.5)
Chloride: 92 mEq/L — ABNORMAL LOW (ref 96–112)
Creatinine, Ser: 7.19 mg/dL — ABNORMAL HIGH (ref 0.4–1.2)
GFR calc Af Amer: 8 mL/min — ABNORMAL LOW (ref >60)
GFR calc non Af Amer: 6 mL/min — ABNORMAL LOW (ref >60)
Glucose, Bld: 197 mg/dL — ABNORMAL HIGH (ref 70–99)

## 2010-12-15 LAB — TSH: TSH: 3.045 u[IU]/mL (ref 0.350–4.500)

## 2010-12-16 LAB — CBC
HCT: 40 % (ref 36.0–46.0)
Hemoglobin: 13.2 g/dL (ref 12.0–15.0)
Hemoglobin: 13.4 g/dL (ref 12.0–15.0)
MCV: 85.2 fL (ref 78.0–100.0)
Platelets: 180 10*3/uL (ref 150–400)
Platelets: 197 10*3/uL (ref 150–400)
RBC: 4.72 MIL/uL (ref 3.87–5.11)
RDW: 16.8 % — ABNORMAL HIGH (ref 11.5–15.5)
WBC: 13 10*3/uL — ABNORMAL HIGH (ref 4.0–10.5)
WBC: 8.8 10*3/uL (ref 4.0–10.5)

## 2010-12-16 LAB — EXPECTORATED SPUTUM ASSESSMENT W GRAM STAIN, RFLX TO RESP C

## 2010-12-16 LAB — GLUCOSE, CAPILLARY
Glucose-Capillary: 122 mg/dL — ABNORMAL HIGH (ref 70–99)
Glucose-Capillary: 144 mg/dL — ABNORMAL HIGH (ref 70–99)
Glucose-Capillary: 171 mg/dL — ABNORMAL HIGH (ref 70–99)
Glucose-Capillary: 171 mg/dL — ABNORMAL HIGH (ref 70–99)
Glucose-Capillary: 191 mg/dL — ABNORMAL HIGH (ref 70–99)
Glucose-Capillary: 213 mg/dL — ABNORMAL HIGH (ref 70–99)
Glucose-Capillary: 235 mg/dL — ABNORMAL HIGH (ref 70–99)
Glucose-Capillary: 24 mg/dL — CL (ref 70–99)
Glucose-Capillary: 275 mg/dL — ABNORMAL HIGH (ref 70–99)
Glucose-Capillary: 33 mg/dL — CL (ref 70–99)
Glucose-Capillary: 88 mg/dL (ref 70–99)

## 2010-12-16 LAB — POCT I-STAT 4, (NA,K, GLUC, HGB,HCT)
Glucose, Bld: 101 mg/dL — ABNORMAL HIGH (ref 70–99)
Hemoglobin: 15.6 g/dL — ABNORMAL HIGH (ref 12.0–15.0)

## 2010-12-16 LAB — RENAL FUNCTION PANEL
BUN: 37 mg/dL — ABNORMAL HIGH (ref 6–23)
CO2: 23 mEq/L (ref 19–32)
Calcium: 8.3 mg/dL — ABNORMAL LOW (ref 8.4–10.5)
Chloride: 99 mEq/L (ref 96–112)
GFR calc Af Amer: 11 mL/min — ABNORMAL LOW (ref >60)
GFR calc non Af Amer: 9 mL/min — ABNORMAL LOW (ref >60)
Glucose, Bld: 25 mg/dL — CL (ref 70–99)
Phosphorus: 4.2 mg/dL (ref 2.3–4.6)
Potassium: 3.7 mEq/L (ref 3.5–5.1)
Sodium: 134 mEq/L — ABNORMAL LOW (ref 135–145)
Sodium: 135 mEq/L (ref 135–145)

## 2010-12-16 LAB — BASIC METABOLIC PANEL
Calcium: 9 mg/dL (ref 8.4–10.5)
Creatinine, Ser: 7.96 mg/dL — ABNORMAL HIGH (ref 0.4–1.2)
GFR calc non Af Amer: 6 mL/min — ABNORMAL LOW (ref >60)
Glucose, Bld: 241 mg/dL — ABNORMAL HIGH (ref 70–99)
Sodium: 136 mEq/L (ref 135–145)

## 2010-12-23 ENCOUNTER — Ambulatory Visit (HOSPITAL_COMMUNITY)
Admission: RE | Admit: 2010-12-23 | Discharge: 2010-12-23 | Disposition: A | Discharge status: Home / Self Care | Payer: Medicare Other | Source: Ambulatory Visit | Attending: Nephrology | Admitting: Nephrology

## 2010-12-23 DIAGNOSIS — Y832 Surgical operation with anastomosis, bypass or graft as the cause of abnormal reaction of the patient, or of later complication, without mention of misadventure at the time of the procedure: Secondary | ICD-10-CM | POA: Insufficient documentation

## 2010-12-23 DIAGNOSIS — N186 End stage renal disease: Secondary | ICD-10-CM

## 2010-12-23 DIAGNOSIS — T82898A Other specified complication of vascular prosthetic devices, implants and grafts, initial encounter: Secondary | ICD-10-CM | POA: Insufficient documentation

## 2010-12-23 MED ORDER — IOHEXOL 300 MG/ML  SOLN
100.0000 mL | Freq: Once | INTRAMUSCULAR | Status: AC | PRN
  Administered 2010-12-23: 48 mL via INTRAVENOUS

## 2011-01-13 NOTE — Op Note (Signed)
NAME:  [name], [name]               ACCOUNT NO.:  [id number]   MEDICAL RECORD NO.:  [id number]          PATIENT TYPE:  AMB   LOCATION:  SDS                          FACILITY:  [facility]   PHYSICIAN:  Di Kindle. Edilia Bo, M.D.DATE OF BIRTH:  11-12-1970   DATE OF PROCEDURE:  05/11/2007  DATE OF DISCHARGE:  05/11/2007                               OPERATIVE REPORT   PREOPERATIVE DIAGNOSIS:  Chronic renal failure.   POSTOPERATIVE DIAGNOSIS:  Chronic renal failure.   PROCEDURE:  Thrombectomy of left upper arm AV graft with insertion of  new segment of graft to the more proximal axillary vein and  intraoperative fistulogram.   SURGEON:  Waverly Ferrari, M.D.   ASSISTANT:  Emilio Aspen, RNFA   ANESTHESIA:  Local with sedation.   TECHNIQUE:  The patient was taken to the operating room and sedated by  anesthesia.  The left upper extremity was prepped and draped in the  usual sterile fashion.  After the skin was infiltrated with 1% lidocaine  a longitudinal incision was made in the axilla and the old venous limb  of the graft was dissected free.  The graft was divided.  The patient  was heparinized.  Graft thrombectomy was achieved using a #4 Fogarty  catheter.  The arterial plug was clearly retrieved.  The graft was  flushed with heparinized saline and clamped.  There were no problems in  pulling the catheter through the body of the graft.  The more proximal  vein was dissected free.   The old venous anastomosis was excised.  It had been an end-to-side  anastomosis.  Very high in the axilla the vein was spatulated.  The 6-mm  PTFE was widely spatulated and sewn end-to-end to the vein using  continuous 6-0 Prolene suture.  The graft was then pulled the  appropriate length for anastomosis to the old graft which was done with  continuous 6-0 Prolene suture.  At the completion there was an excellent  thrill in the fistula and intraoperative arteriogram was obtained which  showed no  technical problems.  Hemostasis was obtained of the wound.   The wound was closed in the deep layer with 3-0 Vicryl.  The skin closed  with 4-0 Vicryl.  Sterile dressing was applied.  The patient tolerated  the procedure well and was transferred to the recovery room in  satisfactory condition.  All needle and sponge counts were correct.      Di Kindle. Edilia Bo, M.D.  Electronically Signed     CSD/MEDQ  D:  05/11/2007  T:  05/12/2007  Job:  [id number]

## 2011-01-13 NOTE — Consult Note (Signed)
[name]               ACCOUNT NO.:  [id number]   MEDICAL RECORD NO.:  [id number]          PATIENT TYPE:  INP   LOCATION:  6732                         FACILITY:  [facility]   PHYSICIAN:  Loreta Ave, MD DATE OF BIRTH:  [birth year]   DATE OF CONSULTATION:  11/14/2008  DATE OF DISCHARGE:                                 CONSULTATION   CONSULTING PHYSICIAN:  1. Charlena Cross, MD of Vascular Surgery.   CHIEF COMPLAINT:  Right groin wound.   HISTORY OF PRESENT ILLNESS:  [name] is a 40 year old African  American female with end-stage renal disease, on hemodialysis 3 times a  week who underwent a right femoral AV graft in February 2010.  On November 06, 2008, she presented to the emergency room with pain, swelling, and  bleeding from her right groin.  She was evaluated by Dr. Myra Gianotti who  noted that the patient was likely in the process of exsanguinating via  her right groin wound and took her emergently to the operating room.  There, he found a completely dehisced arterial anastomosis and removed  her right AV Gore-Tex graft.  I did a patch angioplasty of the right  common femoral artery and right superficial femoral artery, right common  femoral endarterectomy, embolectomy of the right femoral artery, and  repair of the right femoral vein.  Her Gore-Tex graft was sent to  microbiology where it has grown MRSA and VRE.  She has been doing wet to  dry normal saline dressing changes twice daily since the removal of the  graft.  She has had continued edge necrosis of her right groin wound.  The fear is that she has femoral vessels close to the base of this wound  and if the wound is not covered, she presents a risk for repeat  exsanguination, which could be fatal.   PAST MEDICAL HISTORY:  1. End-stage renal disease with hemodialysis Monday, Wednesday, and      Friday.  2. Type 2 diabetes.  3. Anemia of chronic disease.  4. Hypertension.  5. GERD.  6. History of  MRSA.  7. History of a positive PPD.  8. Bilateral pneumonia in January 2010.  9. Secondary hyperparathyroidism.   HOME MEDICATIONS:  She takes,  1. Isoniazid 300 mg p.o. daily.  2. Paroxetine 15 mg p.o. daily.  3. Aspirin 81 mg p.o. daily.  4. Nephro-Vite 1 tablet p.o. daily.  5. Labetalol 200 mg p.o. b.i.d.  6. Hydroxyzine 25 mg p.o. q.6 h. p.r.n.  7. PhosLo 2 g p.o. t.i.d.  8. Reglan 10 mg a.c. and nightly.  9. Humulin 70/30 36 units in the morning, 22 units in the evening.  10.Hectorol 1.5 mcg IV every Monday.  11.Norvasc 5 mg p.o. daily.   SOCIAL HISTORY:  She is a 40 year old female who lives alone.  She has 2  children.  She does not smoke, drink, or use illicit drugs.   FAMILY HISTORY:  Significant for diabetes in her mother and siblings,  and hypertension in the same.   PHYSICAL EXAMINATION:  VITAL SIGNS:  She is afebrile.  Stable  vital  signs.  RIGHT GROIN AND LEG RIGHT:  Right groin wound is 7.5 x 3 cm with a 2 mm  rim of necrotic tissue.  There is a palpable pulse at the base of the  wound with no frankly exposed vessels.  Her right foot is warm and well  perfused with DP and PT Doppler signals, and she has a well-healed skin  graft site on her right thigh.   ASSESSMENT AND PLAN:  [name] is a 40 year old African American  female with end-stage kidney disease, on hemodialysis and significant  right groin wound.   I believe that coverage of this wound in a timely fashion would benefit  the patient by decreasing the risk of catastrophic bleeding should her  patch angioplasty or suture lines breakdown.  I will schedule her for  right rectus femoris muscle flap on November 16, 2008, with possible split-  thickness skin graft coverage.   I discussed the risks of the surgery with the patient, which include but  are not limited to bleeding, infection, damage to nearby structures  including the femoral vessels, partial or total loss of her flap or skin  graft, the  need for future surgery, and potentially the need for a right  leg amputation.  The patient understands these risks and desires to  proceed.      Loreta Ave, MD  Electronically Signed     CF/MEDQ  D:  11/14/2008  T:  11/14/2008  Job:  [id number]

## 2011-01-13 NOTE — H&P (Signed)
[name], [name]               ACCOUNT NO.:  [id number]   MEDICAL RECORD NO.:  [id number]          PATIENT TYPE:  INP   LOCATION:  6703                         FACILITY:  [facility]   PHYSICIAN:  Wilson Singer, M.D.DATE OF BIRTH:  [birth year]   DATE OF ADMISSION:  07/08/2008  DATE OF DISCHARGE:                              HISTORY & PHYSICAL   HISTORY:  This is a 40 year old lady comes in with fevers and chills  associated with nausea and vomiting.  She has been found to have  inflammation of the left upper arm at the site of her graft for  hemodialysis.  She did have hemodialysis on Friday 2 days ago and  without complications and the swelling around the left arm has come in  the last 24 hours or so.  She is the patient who has end-stage renal  disease on hemodialysis on Mondays, Wednesdays, and Fridays.  She is  diabetic and hypertensive.   PAST MEDICAL HISTORY:  Significant for hypertension, insulin-dependent  diabetes mellitus, end-stage renal disease, anemia, gastroesophageal  reflux disease, and hyperparathyroidism.   ALLERGIES:  No known drug allergies.   CURRENT MEDICATIONS:  1. Aspirin 81 mg a day.  2. Coumadin I believe is 2.5 mg a day.  3. Nephro-Vite 1 tablet a day.  4. PhosLo dose unclear.  5. Reglan dose unclear.  6. Humulin 70/30, 36 units every morning and 22 units every evening.  7. Labetalol 400 mg b.i.d.  8. Prograf dose unclear.  9. Hydroxyzine 25 mg every 6 hours.   SOCIAL HISTORY:  The patient does not drink alcohol and does not smoke.  She has 2 children aged approximately 83 and 15 now.   FAMILY HISTORY:  Noncontributory.   REVIEW OF SYSTEMS:  Apart from the symptoms mentioned above, there are  no other symptoms referable to all systems reviewed.   PHYSICAL EXAMINATION:  VITAL SIGNS:  She has a temperature of 101.9,  blood pressure 158/88, pulse 108 in sinus rhythm, respiratory rate 12-  14, and saturation 95%.  GENERAL:  She looks clinically  dehydrated, but is nontoxic clinically.  CARDIOVASCULAR:  Heart sounds are present and normal.  RESPIRATORY:  Lungs fields are clear.  ABDOMEN:  Soft and nontender.  NEUROLOGIC:  She is alert and orientated without focal neurological  signs.  EXTREMITIES:  Examination of her left arm shows what looks like an  abscess with cellulitis in the left upper arm corresponding to where she  has the graft for dialysis.  It is tender to touch.  It is warm to  touch.   INVESTIGATIONS:  Sodium 125, potassium 4.4, chloride 89, bicarbonate 22,  glucose 503, BUN 39, and creatinine 8.78.  Hemoglobin 11.2, white blood  cell count 25.1, and platelets 232.   IMPRESSION:  1. Left arm cellulitis at the site of the dialysis graft plus probably      a local abscess.  2. End-stage renal disease on hemodialysis and clinically dehydrated.  3. Insulin-dependent diabetes mellitus with uncontrolled diabetes with      no diabetic ketoacidosis.  4. Hypertension.  5. Anemia.  6.  Gastroesophageal reflux disease.  7. Hyperparathyroidism.   PLAN:  1. Admit.  2. Intravenous antibiotics, intravenous fluids, and intravenous      insulin for rapid control of her diabetes.  3. Surgical consult for possible incision and drainage might be      required.  4. Renal consult for ongoing hemodialysis.   Further recommendations will depend on the patient's hospital progress.      Wilson Singer, M.D.  Electronically Signed     NCG/MEDQ  D:  07/08/2008  T:  07/08/2008  Job:  [id number]   cc:   [facility]

## 2011-01-13 NOTE — Assessment & Plan Note (Signed)
OFFICE VISIT   CATHELINE, HIXON  DOB:  19-Oct-1970                                       09/27/2008  ZOXWR#:[id number]   The patient underwent removal of an infected left upper arm AV graft  07/08/2008 at [facility].  Her left upper extremity wounds have  now healed satisfactorily.  She has a right internal jugular Diatek  catheter in place.   On further review of her record she has had upper extremity grafts  placed bilaterally.  These extend to the axilla bilaterally.  She does  not appear to have any further sites available in the upper extremities.   Her blood pressure is 199/109, pulse is 96 and temperature 97.8.  She  has 2+ right dorsalis pedis pulse.   Next option seems to be lower extremity graft.  This will be scheduled  for the right lower extremity to be carried out at [facility].   P. Liliane Bade, M.D.  Electronically Signed   PGH/MEDQ  D:  09/27/2008  T:  09/28/2008  Job:  Windy Fast

## 2011-01-13 NOTE — Consult Note (Signed)
NAME:  [name], [name]               ACCOUNT NO.:  [id number]   MEDICAL RECORD NO.:  [id number]          PATIENT TYPE:  OUT   LOCATION:  XRAY                         FACILITY:  [facility]   PHYSICIAN:  Charlynne Pander, D.D.S.DATE OF BIRTH:  1971-07-05   DATE OF CONSULTATION:  03/11/2009  DATE OF DISCHARGE:                                 CONSULTATION   REASON FOR CONSULTATION:  [name] is a 40 year old female  referred by the hospitalist for dental consultation.  The patient with a  history of poor dentition.  Consult requested to evaluate and provide  treatment as indicated.   MEDICAL HISTORY:  1. History of gangrene/osteomyelitis of the left foot.      a.     Status post partial calcaneal excision in left foot by Dr.       Lajoyce Wang on March 04, 2009.  2. Diabetes mellitus - type 2.  3. Hypertension.  4. End-stage renal disease on Monday, Wednesday, Friday at [facility].  5. Secondary hyperparathyroidism.  6. 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  7. Anemia of chronic disease.  8. Leukocytosis this admission.  9. History of gastroesophageal reflux disorder.  10.History of diarrhea - resolved.  11.History of right groin arteriovenous graft infection with      methicillin-resistant Staphylococcus aureus and Escherichia coli.   ALLERGIES:  None known.   MEDICATIONS:  1. Alteplase 4.6 IV x1.  2. Aranesp IV Wednesday.  3. Tiazac 240 mg at bedtime.  4. Lovenox 30 mg subcutaneously daily.  5. NovoLog insulin 3 times daily per sliding scale.  6. NovoLog 70/30 insulin 10 units twice daily.  7. INH 300 mg at bedtime.  8. Metanx twice daily.  9. Reglan 10 mg before meals and at bedtime.  10.Avelox 400 mg daily.  11.Nitroglycerin patch daily.  12.Nepro 8 ounces 3 times daily.  13.Protonix 40 mg daily.  14.Vitamin B6 50 mg at bedtime.  15.Nephro-Vite at bedtime.  16.Senokot-S at bedtime.  17.Renagel 3 times daily.   SOCIAL  HISTORY:  The patient is a nonsmoker and nondrinker.  The patient  previously lived at home with her family, although may need skilled  nursing facility in the future.   FAMILY HISTORY:  Mother with a history of diabetes mellitus.   FUNCTIONAL ASSESSMENT:  The patient now independent for ADLs at this  time.   REVIEW OF SYSTEMS:  This was reviewed from the chart for this admission.   DENTAL HISTORY:   CHIEF COMPLAINT:  Dental consultation requested for evaluation of poor  dentition.   HISTORY OF PRESENT ILLNESS:  The patient currently denies acute  toothaches, swellings, or abscesses.  The patient knows that she has  several loose teeth, some of which fell out approximately 2-3 years ago.  The patient has not seen a dentist for 3-4 years.  The patient indicates  that the last dentist indicated that she may need all my teeth out..   DENTAL EXAMINATION:  GENERAL:  The patient is a well-developed, well-  nourished female in no acute distress.  VITAL SIGNS:  Blood pressure is 112/65, pulse rate is 79, respirations  are 18, and temperature 98.7.  HEAD AND NECK:  There is no palpable lymphadenopathy.  The patient  denies acute TMJ symptoms.  INTRAORAL:  The patient with normal saliva.  The patient with no  evidence of abscess formation at this time.  DENTITION:  The patient is missing multiple teeth.  Ideally, I would  need dental x-rays to identify the extent of the missing teeth.  PERIODONTAL:  The patient with chronic advanced periodontal disease with  plaque calculus accumulations, generalized gingival recession, and  generalized tooth mobility.  DENTAL CARIES:  There are multiple dental caries noted.  I would need a  full series of dental radiographs to identify the extent of the dental  caries.  CROWN OR BRIDGE:  There are no apparent crown restorations noted.  PROSTHODONTIC:  The patient denies presence of partial dentures.  OCCLUSION:  The patient with poor occlusal scheme  secondary to multiple  missing teeth and lack of replacement of the missing teeth with dental  prostheses.   RADIOGRAPHIC INTERPRETATION:  No orthopantogram was obtained at this  time.  Ideally, the patient would go to the [facility] for  orthopantogram x-ray as well as a full series dental radiographs as  indicated.   ASSESSMENT:  1. Chronic advanced periodontal disease.  2. Plaque and calculus accumulations.  3. Generalized gingival recession.  4. Generalized tooth mobility.  5. Dental caries.  6. Multiple missing teeth.  7. No history of partial dentures.  8. Poor occlusal scheme.  9. Significant medical comorbidities.   PLAN/RECOMMENDATIONS:  1. I discussed risks, benefits, and complications of various treatment      options with the patient in relationship to her medical and dental      conditions.  We discussed the need for future dental x-rays at this      time.  The patient currently will most likely be discharged in the      next day or so.  The patient will then follow up to the [facility] for an exam, dental x-rays, and overall treatment      planning issues at that time.  The patient then will most likely      need to proceed with extraction of all remaining teeth with      alveoplasty and pre-prosthetic surgery as indicated along with      future fabrication of upper and lower complete dentures by the      primary dentist of her choice.  This will be after adequate      healing, however.  2. Discussion of findings with medical team as indicated and      coordination of future dental medicine appointment in the future      weeks as the patient desires.  The patient indicates currently that      she will call Dental Medicine at [facility] to arrange      for a followup dental appointment as soon as she is discharged.      Charlynne Pander, D.D.S.  Electronically Signed     RFK/MEDQ  D:  03/11/2009  T:  03/12/2009  Job:   [id number]   cc:   Altha Harm, MD  Cecille Aver, M.D.

## 2011-01-13 NOTE — Discharge Summary (Signed)
NAMEJESSENIA, Wang               ACCOUNT NO.:  [id number]   MEDICAL RECORD NO.:  [id number]          PATIENT TYPE:  INP   LOCATION:  6704                         FACILITY:  [facility]   PHYSICIAN:  Elliot Cousin, M.D.    DATE OF BIRTH:  06-26-1971   DATE OF ADMISSION:  06/20/2008  DATE OF DISCHARGE:  06/21/2008                               DISCHARGE SUMMARY   DISCHARGE DIAGNOSES:  1. Hypoglycemia.  2. Insulin dependent diabtes mellitus.  3. Hypothermia.  4. Urinary tract infection.  5. End-stage renal disease.  6. Standing dose Coumadin daily to prevent thrombosis of the      arteriovenous graft.   DISCHARGE MEDICATIONS:  1. Hemodialysis on Monday, Wednesday, and Friday.  2. Aspirin 81 mg daily.  3. Coumadin 2.5 mg daily. (standing dose).  4. Nephro-Vite 1 tablet daily.  5. Reglan 10 mg before each meal and at bedtime.  6. Humulin 70/30 insulin 36 units in the morning and 22 units in the      evening (take half if appetite was poor and p.o. intake is      limited).  7. Hydroxyzine 25 mg every 6 hours as needed.  8. Labetalol 200 mg, take 2 pills b.i.d.  9. Cipro 250 mg daily for 3 more days.   DISCHARGE DISPOSITION:  The patient is being discharged to home in  improved and stable condition.  She was advised to follow up with her  primary care physician, Dr. [name], next week and with the  nephrologist at hemodialysis as scheduled.   CONSULTATIONS:  [facility] Nephrologists, Dr. Hyman Wang and  Dr. Briant Wang.   PROCEDURES PERFORMED:  1. Hemodialysis.  2. Renal ultrasound on June 20, 2008.  The results revealed      bilateral small echogenic kidneys compatible with nonspecific renal      medical disease.  No hydronephrosis.  Right kidney measures 8 cm      and left kidney measures 9.3 cm.  3. Acute abdominal series on June 20, 2008.  The results revealed      no acute cardiopulmonary findings.  Calcified uterine fibroids,      stable.   HISTORY OF  PRESENT ILLNESS:  The patient is a 40 year old woman with a  past medical history significant for end-stage renal disease and  diabetes mellitus, who presented to the emergency department on June 20, 2008, after she was found to be lethargic by her family members.  EMS was called, and when the EMT arrived, she was found to have a  capillary blood glucose of 27.  She was given glucagon by the EMT and  transferred to [facility].  When the patient arrived to the  emergency department, her venous glucose had improved to 192.  Also  during the evaluation in the emergency department, the patient was noted  to be hypothermic with a temperature of 92.1.  Her lab data were  significant for a white blood cell count of 16.4 and a urinalysis which  revealed large leukocyte esterase and too numerous to count wbc's.  The  patient was, therefore, admitted  for further evaluation and management.   HOSPITAL COURSE:  1. HYPOGLYCEMIA:  Per the patient's history, she had eaten very little      prior to her presentation to the emergency department.  However,      she did continue to take her insulin as usual.  The patient only      had a complaint of dull abdominal pain but no complaints of chest      pain, cough, subjective fever and chills, or painful urination.      The patient was promptly treated with glucagon and dextrose.      Insulin was withheld.  Over the course of the hospitalization, the      patient's capillary blood glucose remained above 150.  She was      advised to resume her usual dose of insulin upon discharge;      however, she was strongly advised to take half the dose if she was      not eating as usual.  The patient was also advised to avoid taking      insulin if her capillary blood glucose is consistently below 120.      She voiced understanding.  She was instructed to follow up with her      primary care physician, Dr. [name], next week for further      evaluation.  Of  note, the patient does have insulin-dependent      diabetes mellitus.  Her hemoglobin A1c was 11.3.  2. HYPOTHERMIA, LEUKOCYTOSIS, AND  URINARY TRACT INFECTION:  The      patient's core temperature was recorded as 92.1 at the time of the      initial hospital assessment.  She was placed in a warming blanket      and followed closely.  Prior to her transfer out of the emergency      department, her core body temperature improved to 97.8. As      indicated above, the patient's urinalysis was positive for wbc's.      She was started on Rocephin emperically. Blood and urine cultures      were ordered and the results are pending.  Dr. Briant Wang plans to      see the patient in followup at the dialysis center, and he noted      that he will check the urine culture in the next day or 2.  The      patient's temperature remained within normal limits during the      hospitalization.  Her white blood cell count was 16.4 at the time      of the initial hospital assessment; however, today, it has      normalized to 8.9.  The patient received a total of 2 days of      Rocephin therapy.  She will be discharged to home on 3 more days of      Cipro.  Of note, an urine drug screen and TSH were ordered for      further evaluation.  The patient's urine drug screen was negative,      and her TSH was within normal limits at 1.565.  A random cortisol      and lactic acid were ordered as well and the results were within      normal limits.  3. NONSPECIFIC ABDOMINAL PAIN:  The patient was evaluated with an      abdominal x-ray and a renal ultrasound.  The abdominal x-ray  revealed no acute changes, and the renal ultrasound revealed      medical renal disease.  4. END-STAGE RENAL DISEASE:  [facility] were consulted      during the hospitalization.  The patient underwent hemodialysis      yesterday.  She will be followed by the nephrologists accordingly      at the dialysis center.  5.  STANDING DOSE COUMADIN THERAPY:  The patient apparently takes 2.5      mg of Coumadin daily without any adjustments in dosing in order to      prevent thrombosis of her graft.      Elliot Cousin, M.D.  Electronically Signed     DF/MEDQ  D:  06/21/2008  T:  06/22/2008  Job:  [id number]

## 2011-01-13 NOTE — Op Note (Signed)
NAMENORBERTA, STOBAUGH               ACCOUNT NO.:  [id number]   MEDICAL RECORD NO.:  [id number]          PATIENT TYPE:  INP   LOCATION:  6714                         FACILITY:  [facility]   PHYSICIAN:  Janetta Hora. Fields, MD  DATE OF BIRTH:  20-Nov-1970   DATE OF PROCEDURE:  10/18/2008  DATE OF DISCHARGE:  10/02/2008                               OPERATIVE REPORT   PROCEDURE:  Right thigh arteriovenous graft.   PREOPERATIVE DIAGNOSIS:  End-stage renal disease.   POSTOPERATIVE DIAGNOSIS:  End-stage renal disease.   ANESTHESIA:  General.   ASSISTANT:  Wilmon Arms, PA   OPERATIVE FINDINGS:  1. A 6-mm PTFE.  2. Arterial anastomosis end-to-side to superficial femoral artery.  3. Venous anastomosis end-to-side to common femoral vein.   OPERATIVE DETAILS:  After obtaining informed consent, the patient was  taken to the operating room.  The patient was placed in supine position  on the operating room table.  After induction of general anesthesia and  the patient in a laryngeal mask, the patient's entire right groin,  abdomen and upper thigh were prepped and draped in usual sterile  fashion.  Next, an oblique incision was made in the right groin crease.  Incision was carried down through the subcutaneous tissues down to the  level of the common femoral artery.  Dissection then proceeded  inferiorly down to the level of the femoral bifurcation.  Approximately  4 cm of the superficial femoral artery was mobilized and vessel loops  placed around this.  Next, the adjacent greater saphenous vein was  dissected free circumferentially.  This was dissected free and carried  up to the level of the saphenofemoral junction.  At the saphenofemoral  junction, there were several large branches coming off and I was  concerned about multiple valves in this area.  Therefore, it was decided  that the common femoral vein would be prepared for the venous  anastomosis.  The anterior two third surface of the  common femoral vein  was dissected free and prepared for clamping.  Next, a subcutaneous  tunnel was created in a loop configuration down the anterior right thigh  with a transverse incision in the distal thigh for assistance in  tunneling.  The 6-mm PTFE graft was then brought through the  subcutaneous tunnel.  The patient was given 5000 units of intravenous  heparin.  The proximal superficial femoral artery was controlled with a  fistula clamp.  The distal superficial femoral artery was controlled  with a vessel loop and longitudinal opening was made in the proximal  right superficial femoral artery.  A graft was beveled and sewn end of  graft to side of artery using a running 6-0 Prolene suture.  Just prior  to completion of anastomosis, this was fore-bled, back-bled and  thoroughly flushed.  Anastomosis was secured, clamps released and there  was a pulsatile flow in the graft immediately.  The graft was clamped  just above the level of the planned femoral vein anastomosis.  The  common femoral vein was controlled with a Cooley clamp.  The greater  saphenous vein was  ligated with a 2-0 silk tie and transected.  The  saphenofemoral junction was then excised.  The graft was cut to length  and beveled and sewn end of graft to side of vein using a running 6-0  Prolene suture.  Just prior to completion of anastomosis, this was fore-  bled, back-bled and thoroughly flushed.  Anastomosis was secured, clamps  released, there was a palpable thrill in the graft immediately.  Hemostasis was obtained.  Deep layer of the groin was closed with  running 2-0 Vicryl suture.  Subcutaneous layers of both incisions were  closed with a running 3-0 Vicryl suture.  The skin of both  incisions were closed with a 4-0 Vicryl subcuticular stitch.  The  patient tolerated the procedure well and there were no complications.  Instrument, sponge and needle counts were correct at the end of the  case.  The patient was  taken to the recovery room in stable condition.      Janetta Hora. Fields, MD  Electronically Signed     CEF/MEDQ  D:  10/18/2008  T:  10/19/2008  Job:  [id number]

## 2011-01-13 NOTE — Assessment & Plan Note (Signed)
OFFICE VISIT   [name], [name]  DOB:  Jan 11, 1971                                       09/17/2009  AVWUJ#:[id number]   The patient returns today for further evaluation of the lower extremity  circulation and for vascular access.  I saw her 07/30/2009 and  recommended a left thigh graft but she refused to consider it at that  time.  That is her only remaining site for access.  She is not a  candidate for any further fistulas.  Apparently the Palindrome catheter  in the left internal jugular vein is not working well and will be  exchanged later this week.  She does have a pressure sore on her right  heel which has been present for many months.   PHYSICAL EXAMINATION:  Her blood pressure is 144/92, heart rate is 97,  temperature 98.4.  There is a dry eschar over the right heel with no  evidence of active infection.  She has a 3+ femoral, 2+ popliteal and 1-  2+ dorsalis pedis pulse in the right.  ABIs 9 months ago were normal  with biphasic flow in the right foot.  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 Lajoyce Corners who performed the amputation.  She does  have 2-3+ femoral pulse on the left.   This patient needs a left thigh graft but only after the left above knee  amputation stump has completely healed.  Since this is her last site of  access we do not want to risk infection in the left side.  Please send  her back to see [place] for evaluation for left thigh graft after the AKA has  completely healed.  She is to see Dr. Lajoyce Corners later this week.     [name] Hart Rochester, M.D.  Electronically Signed   JDL/MEDQ  D:  09/17/2009  T:  09/18/2009  Job:  [id number]

## 2011-01-13 NOTE — Discharge Summary (Signed)
[name], [name]               ACCOUNT NO.:  [id number]   MEDICAL RECORD NO.:  [id number]          PATIENT TYPE:  INP   LOCATION:  6730                         FACILITY:  [facility]   PHYSICIAN:  Nadara Mustard, MD     DATE OF BIRTH:  03-14-1971   DATE OF ADMISSION:  04/04/2009  DATE OF DISCHARGE:  04/15/2009                               DISCHARGE SUMMARY   FINAL DIAGNOSES:  1. Pulmonary atelectasis.  2. End-stage renal disease.  3. Chronic anemia.  4. Type 2 diabetes.  5. Early sepsis.  6. Hypertension.  7. Gangrene, left foot.   PROCEDURES:  1. Left transtibial amputation.  2. Bronchoscopy for atelectasis.  3. Brief ICU admission for her atelectasis.   Discharged to home in stable condition.   DISCHARGE MEDICATIONS:  Include her admission medications which are;  1. Sliding scale insulin as per her regular sliding scale dose.  2. Renagel 2400 mg t.i.d.  3. Isoniazid 300 mg p.o. daily.  4. Renal vitamin p.o. nightly.  5. Cardizem/Tiazac 240 mg p.o. daily.  6. Vitamin B6 50 mg p.o. daily.  7. 70/30 insulin b.i.d. as per her routine dose.  8. Aranesp 200 mcg IV Friday with hemodialysis.  9. Hectorol 0.5 mcg IV Monday, Wednesday, and Friday.  10.Reglan 5 mg p.o. q.8 h.  11.Iron 50 mg IV Friday with hemodialysis.  12.Nutrition supplement, Resource 240 mL p.o. b.i.d.  13.Atarax 25 mg p.o. t.i.d. p.r.n.  14.Ambien 5 mg p.o. nightly p.r.n. sleep.  15.Calcium carbonate 500 mg p.o. q.6 h. p.r.n..   HISTORY OF PRESENT ILLNESS:  The patient is a 40 year old woman, end-  stage renal disease, and diabetes with gangrenous changes to her left  foot.  She had failed foot salvage surgery and presented at this time  for transtibial amputation.   The patient underwent her transtibial amputation on April 04, 2009.  She  received Kefzol for infection prophylaxis.  Postoperatively, the patient  was quite lethargic, and was anticipated to be discharged on the weekend  after surgery.   However, due to her increased lethargy, the patient did  develop atelectasis.  She was seen in consultation with Medicine as well  as Renal, and the patient did develop increased temperatures.  She had  worsening pulmonary function on August 10.  She was started on albuterol  inhalers and was transferred to the Unit for pulmonary critical care  evaluation.  The patient was felt to benefit from bronchoscopy and she  did undergo bronchoscopy while in the ICU.  Her incision remained clean  and dry and no evidence of any wound infections.  The patient was  consulted with rehab; however, she stated that she wanted to go home and  did not want to go to rehab.  We will set her up with [facility]  for home health physical therapy with followup in the office in 2 weeks.      Nadara Mustard, MD  Electronically Signed     MVD/MEDQ  D:  04/15/2009  T:  04/15/2009  Job:  [id number]

## 2011-01-13 NOTE — Op Note (Signed)
NAMECHARENE, [name]               ACCOUNT NO.:  [id number]   MEDICAL RECORD NO.:  [id number]          PATIENT TYPE:  AMB   LOCATION:  SDS                          FACILITY:  [facility]   PHYSICIAN:  Larina Earthly, M.D.    DATE OF BIRTH:  Jan 03, 1971   DATE OF PROCEDURE:  01/20/2007  DATE OF DISCHARGE:                               OPERATIVE REPORT   PREOPERATIVE DIAGNOSIS:  End stage renal disease.   POSTOPERATIVE DIAGNOSIS:  End stage renal disease.   PROCEDURES:  Placement of left upper arm arteriovenous Gore-Tex graft.   SURGEON:  Larina Earthly, M.D.   ASSISTANT:  Jerold Coombe, P.A.   ANESTHESIA:  MAC.   COMPLICATIONS:  None.   DISPOSITION:  To recovery room stable.   PROCEDURE IN DETAIL:  The patient was taken to the operating room,  placed in supine position where the left arm was prepped and draped in  the usual sterile fashion.  The patient had prior failed right lower and  upper arms and also prior failed left forearm loop graft.  She is  currently being dialyzed via Diatek catheter.  The patient had no  palpable radial pulse and had a 1+ ulnar pulse prior to the procedure.  The patient had a 1 to 2+ brachial pulse at the area of old scar from  her prior forearm loop graft.  Using local anesthesia, an incision was  made over the axillary pulse and carried down to dissect the axillary  vein which is a good caliber.  Using local anesthesia, a separate  incision was made over the antecubital space and the old venous  anastomosis was excised.  The brachial artery was exposed and was small  caliber.  The artery was exposed proximal and distal to her old brachial  anastomosis for the old failed forearm loop graft.  A tunnel was created  from the antecubital space to the axillary incision.  A 6 mm standard  wall graft was brought through the tunnel.  The vein was occluded  proximally and distally with the help of 11 blade and longitudinally  with Potts scissors.  The  graft was sewn end to side to the vein with a  running 6-0 Prolene suture.  The clamps were removed.  Good backbleeding  was noted and the vein was flushed with heparinized saline and  reoccluded.  Next, the brachial artery was occluded proximal and distal  to the old arterial anastomosis.  The old arterial anastomosis was taken  down and the graft was cut to the appropriate length and was sewn end to  side to the brachial artery with a running 6-0 Prolene suture.  Prior to  our closure of the anastomosis, the usual flush maneuvers were  undertaken and the anastomosis was completed.  The patient had Doppler  signal in the radial and ulnar with the graft occluded.  This was  markedly dampened with the graft open.  The patient did have blood  pressure in the 105 to 110 systolic range.  I felt that the patient was  at high risk for both occlusion  and steal  due to her small arteries and blood pressure.  The wounds were irrigated  with saline.  Hemostasis with electrocautery.  The wounds were closed  with 3-0 Vicryl in the subcutaneous and subcuticular tissue.  Benzoin  and Steri-Strips were applied.      Larina Earthly, M.D.  Electronically Signed     TFE/MEDQ  D:  01/20/2007  T:  01/20/2007  Job:  [id number]

## 2011-01-13 NOTE — Group Therapy Note (Signed)
NAME:  [name], [name]               ACCOUNT NO.:  [id number]   MEDICAL RECORD NO.:  [id number]          PATIENT TYPE:  OUT   LOCATION:  XRAY                         FACILITY:  [facility]   PHYSICIAN:  Elliot Cousin, M.D.    DATE OF BIRTH:  1971-04-15                                 PROGRESS NOTE   Please see the previous progress note summary dictated by Dr. Eda Paschal.  This is an addendum.   CONSULTATIONS:  1. Aldean Baker, M.D.  2. [facility].   PROCEDURE PERFORMED:  1. Partial calcaneal excision of the left heel on March 04, 2009 by Dr.      Aldean Baker.  2. Hemodialysis   ACTIVE PROBLEM LIST/DIAGNOSES:  1. Status post partial calcaneal excision of the left foot secondary      to gangrene and osteomyelitis.  The patient is currently      postoperative day #1.  She developed postoperative fever of 100.5      yesterday.  Her white blood cell count actually improved to 16.6      yesterday; however, it has increased to 20.2 today.  Overall,      however, the trend of her white blood cell count has been downward.      Specimens of the left heel were obtained by Dr. Lajoyce Corners, and were sent      to the lab for culturing.  The results are pending.  Blood cultures      were ordered yesterday, and the results are pending as well.  The      patient is being maintained on vancomycin and Cefepime.  She has      received approximately 10 days of therapy.  Following the      operation, a wound VAC device was applied under the guidance of Dr.      Lajoyce Corners.  It is being changed on Monday, Wednesday and Friday.  He      plans to discontinue the wound VAC on Friday, July 9.  The physical      therapist has been consulted.  The patient was living at home prior      to the operation.  It is unclear whether or not she will return      home.  The patient may require short-term skilled nursing facility      placement.  However, this is yet to be determined.  2. Diabetes mellitus with hypoglycemia.  The  patient's capillary blood      glucose fell to 36 this afternoon.  She was treated appropriately      with D50 and a sweetened beverage.  Her capillary blood glucose      improved to 156.  Upon questioning,  the patient  admitted to not      eating much of the renal diet, because I can't eat that type of      food.  The Lantus will be discontinued today, and she will be      maintained on sliding scale NovoLog.  If the nephrology team      agrees, the patient's diet could be  liberalized.  3. Diarrhea.  The  patient's loose stools have more or less resolved.      C.  Diff toxin assays have all been negative.  4. Hypertension.  The patient's blood pressures have been well      controlled.  5. End-stage renal disease.  The nephrology team is following the      patient.  She has been undergoing hemodialysis during the hospital      course.  6. History of positive PPD.  The patient is being treated with INH and      vitamin B6.  I discussed the recommended course of treatment with      infectious diseases physician, Dr. Sampson Goon.  He stated that the      normal course of INH therapy is 9 months for a positive PPD.      However, it is unclear when the patient actually was started on      treatment.  I asked both the patient and the patient's sister about      the start date; they gave me different answers.  The patient      believed she started INH therapy late last year; however, her      sister believed that INH therapy was started this year.      Apparently, the health department is following the patient.  And      according to the patient, she has not been told to stop INH therapy      by the representatives at the Health Department yet.   DISCHARGE DISPOSITION:  The patient is not ready for hospital discharge yet.  She is undergoing  active wound care with the wound VAC, as ordered by Dr. Lajoyce Corners.  Her white  blood cell count is still elevated.  However, her fever is resolving.  The  physical therapy evaluation is currently pending.  It is unclear  whether or not the patient will require short-term skilled nursing  facility placement or simply home health services; this has yet to be  determined.      Elliot Cousin, M.D.  Electronically Signed     DF/MEDQ  D:  03/05/2009  T:  03/05/2009  Job:  [id number]   cc:   Fayrene Fearing L. Deterding, M.D.  Fax: [phone]   Nadara Mustard, MD  Fax: [phone]

## 2011-01-13 NOTE — Assessment & Plan Note (Signed)
[facility]   NAME:  [name], [name]               ACCOUNT NO.:  [id number]   MEDICAL RECORD NO.:  [id number]      DATE OF BIRTH:  [birth year]   PHYSICIAN:  Maxwell Caul, M.D. VISIT DATE:  02/21/2009                                   OFFICE VISIT   [name] is a lady that I saw last week, referred through office of  her podiatrist, Dr. Tinnie Gens Petrinitz.  She is also followed by Dr.  Darrick Penna.  She is a type 1 diabetic, who has been on dialysis for  roughly 4 years.  The history I obtained last week was rapid onset,  roughly 2 months ago, of wounds on the plantar aspect of her right foot.  There was pain and drainage.  It sounds as though she received  intravenous antibiotics at dialysis.  She had had a nice workup in the  podiatry office including x-rays and arterial Dopplers.  They were not  particularly successful with offloading this.  Last week, we used a  Puracol, hydrogel to the wounds on the right foot and Santyl to a more  necrotic-looking wound on her left heel.  Of the bad list of options  with regards to offloading, I simply put her in healing sandal.  She has  not been compliant with this aspect through Podiatry.   PHYSICAL EXAMINATION:  Her temperature is 98.8, pulse 101, respirations  18, blood pressure 112/72.   She has several small wounds on the right plantar foot.  These all looks  somewhat better.  There was one of them that I cultured last week and  grew MRSA.  She is on doxycycline.  All of the wounds on the plantar  aspect of the right foot looked better.  The wound on the left heel  still looks necrotic and somewhat boggy, and there is tenderness here.   IMPRESSION:  1. Probably infected wounds on the plantar aspect of her right foot.      All of these look somewhat better.  I applied Puracol Ag, hydrogel,      wrapped this in Kerlix, and I am going to continue her in the      healing sandal.  2. The left heel:  This looks  somewhat worse to me.  There was an odor      to it.  I have elected to go ahead and do an MRI of her bilateral      feet to delineate the extent of the infection here.  Depending on      the result of the MRI, she may need adjustments to parenteral      antibiotics and consideration of hyperbaric oxygen (therapy).  3. Cellulitis of right foot with methicillin-resistant Staphylococcus      aureus:  We will continue her on doxycycline for further notice or      at least until the MRI comes back.  There was some drainage      surrounding the left heel, which I have also cultured today.   We have ordered a CBC, differential, sedimentation rate, basic metabolic  panel, and an MRI of the bilateral feet.           ______________________________  Maxwell Caul, M.D.  MGR/MEDQ  D:  02/21/2009  T:  02/21/2009  Job:  [id number]

## 2011-01-13 NOTE — Op Note (Signed)
NAMEBRYTTANI, [name]               ACCOUNT NO.:  [id number]   MEDICAL RECORD NO.:  [id number]          PATIENT TYPE:  OIB   LOCATION:  2550                         FACILITY:  [facility]   PHYSICIAN:  Larina Earthly, M.D.    DATE OF BIRTH:  [birth year]   DATE OF PROCEDURE:  12/31/2006  DATE OF DISCHARGE:                               OPERATIVE REPORT   PREOPERATIVE DIAGNOSIS:  End-stage renal disease with occluded right  upper arm arteriovenous Gore-Tex graft.   POSTOPERATIVE DIAGNOSIS:  End-stage renal disease with occluded right  upper arm arteriovenous Gore-Tex graft.   PROCEDURE:  Attempted thrombectomy of right upper arm arteriovenous Gore-  Tex graft, with exploration of arteriovenous anastomosis, and also graft  exploration, followed by ligation of graft, and placement of right  internal jugular Diatek catheter.   SURGEON:  Larina Earthly, MD.   ASSISTANT:  Theda Belfast, PA-C.   ANESTHESIA:  MAC.   COMPLICATIONS:  None.   DISPOSITION:  To recovery room, stable.   INDICATIONS FOR PROCEDURE:  The patient is a 40 year old female with end-  stage renal disease, with recent thrombolysis.  This showed severe  stenosis at the venous anastomosis, and also some graft degeneration.  She had achieved reocclusion of this.  The patient was taken to the  operating room for thrombectomy and revision.   PROCEDURE IN DETAIL:  The patient was taken to the operating room and  placed in supine position, where the areas of the right arm and right  axilla were prepped and draped in the usual sterile fashion.  Incision  was made over the axillary incision and carried down to bisect the  graft.  There was perforation of the graft at the prior thrombectomy and  angioplasty site, with some hematoma around this area.  The vein was  exposed further proximally and was sclerotic.  The vein was exposed  extremely high in the axilla, as high as could be reached, and it was  still somewhat thickened  at that level.  The graft was tried to be  thrombectomized, and there was degeneration of the graft, and also an  apparent tear in the graft from the prior areas of access for  thrombolysis and balloon angioplasty.  The arterial anastomosis was  exposed and the brachial artery was isolated proximal and distal to the  old anastomosis.  The vein was exposed further proximally with attempted  anastomosis.  On opening this further, it was obvious this was too high  to be able to technically sew an anastomosis.  For this reason, the vein  was oversewn at this level with a 5-0 Prolene suture, and the graft was  ligated near the arterial anastomosis.  The wounds were irrigated with  saline, hemostasis obtained with cautery, and the wounds were closed  with 3-0 Vicryl for subcutaneous and subcuticular tissue.  Next, the  right and left neck were imaged with the ultrasound, revealing patent  jugular veins bilaterally.  The patient was placed in Trendelenburg  position.  Using local anesthesia and a finder needle, the right  internal jugular vein was  identified.  A guide wire was attempted to be  passed down centrally, and this would not go towards the right atrium.  An end-hole catheter was passed over the guide wire and the guide wire  was removed, and contrast through the end-hole catheter in the internal  jugular vein revealed tortuosity, but patency of the innominate vein  into the right atrium.  With further manipulation through the end-hole  catheter, the guide wire was passed down to the level of the right  atrium.  A dilator and peel-away sheath were passed over the guide wire.  The dilator and guide wire were removed, and the 28-cm Diatek catheter  was passed through the peel-away sheath, which was then removed as well.  The catheters were brought through a subcutaneous tunnel through a  separate stab incision.  Two lumen ports were attached.  Both lumens  flushed and aspirated easily and  were locked with 1000 units per mL  heparin.  The catheter was secured to the skin with a 3-0 nylon, and the  inner side was closed with a 4-0 subcuticular Vicryl stitch.  A sterile  dressing was applied, and the patient was taken to the recovery room in  stable condition.      Larina Earthly, M.D.  Electronically Signed     TFE/MEDQ  D:  12/31/2006  T:  12/31/2006  Job:  [id number]   cc:   Fayrene Fearing L. Deterding, M.D.

## 2011-01-13 NOTE — Assessment & Plan Note (Signed)
[facility]   NAME:  [name], [name] NO.:  [id number]   MEDICAL RECORD NO.:  [id number]      DATE OF BIRTH:  22-Feb-1971   PHYSICIAN:  Maxwell Caul, M.D. VISIT DATE:  02/14/2009                                   OFFICE VISIT   HISTORY:  [name] is a patient who is referred here through the  office of her podiatrist, Dr. Geradine Girt.  She is also followed  by Dr. Darrick Penna.  She is here for review of ulcers on her right plantar  aspect in her plantar arch and also an area on the left lateral heel.   The patient tells me that she is probably a type 1 diabetic for 20  years, at least always been on insulin.  She has been on dialysis for 4  years.  Roughly 2 months ago, she woke up in the morning with wounds on  the plantar aspect of her right foot.  There was pain and drainage at  the time.  By her account, she received intravenous antibiotics at  dialysis.  Roughly a week later, an area opened up on the left lateral  heel and she was seen by Podiatry since then.  She has actually had a  really nice workup.  She has had arterial Dopplers that showed biphasic  Doppler wave forms with noncompressible vessels.  X-rays done showed  osteopenia but no evidence of osteomyelitis.  She has been applying  Silvadene cream and had serial debridements.  Offloading was suggested  with crutches, although I do not think that she has been particularly  compliant with this.  As mentioned, she has undergone serial  debridements and application of Silvadene cream.  She was fitted for an  air-fracture walker offloading, yet arrived here in plastic sandals.   PHYSICAL EXAMINATION:  Her peripheral pulses in her feet are well  palpable.  The area in question was largely in the plantar arch of her  right foot.  There were several areas that looked like they were  initially ulcers.  Some of them had calloused over and some of them are  still open.  I did  do a blind culture of one of the still open areas.  All the areas were debrided of surrounding callus.  Some of them  appeared to have largely epithelialized over, however, there are two  open areas here.  The area on her left heel was covered with a black  eschar which I removed.  There is an open area here that I could not  fully debride.  There was no overt evidence of infection in her foot,  although discoloration of the surrounding skin in her right plantar foot  lead me to believe that this was probably infectious in etiology at  least initially.  As mentioned, there is no evidence of ongoing  infection currently.   IMPRESSION:  Bilateral foot wounds in a type 1 diabetic.  These were  probably Wagner grade 2 wounds.  I think she has had a nice workup  through her podiatrist's office.  I did do a culture of one of the  ulcers on the right foot.  I discussed the importance of offloading  whatever the pathogenesis of  these wounds were initially.  She did come  in here in plastic sandals, this will clearly not be satisfactory and I  discussed the implications of this with her.  To the wounds on the right  plantar aspect, we applied Puracol AG, felt over offloading.  To the  area on the left heel, we applied Santyl and moist gauze and a dry  dressing and did a felt offloading donut.  Of the bad list of options  with regards to offloading, I chose to put her in healing sandals.  I  doubt she would tolerate anything more aggressive than this and she  simply did not use the crutches provided previously.   We will see her back on Monday for a nurse visit to change the dressing  and I will re-review her in 1 week.  As mentioned, I did do a culture.  X-rays and arterial Dopplers have already been done.  I will review this  again in a week.           ______________________________  Maxwell Caul, M.D.     MGR/MEDQ  D:  02/14/2009  T:  02/15/2009  Job:  [id number]

## 2011-01-13 NOTE — Procedures (Signed)
VASCULAR LAB EXAM   INDICATION:  Right thigh pain status post right thigh graft placement.   HISTORY:  Diabetes:  Yes.  Cardiac:  No.  Hypertension:  Yes.   EXAM:  Duplex of the right thigh region.   IMPRESSION:  1. Patent right thigh graft with no evidence of focal stenosis/steal.  2. Unable to visualize groin due to pain.   ___________________________________________  V. Charlena Cross, MD   MG/MEDQ  D:  10/22/2008  T:  10/22/2008  Job:  [id number]

## 2011-01-13 NOTE — Op Note (Signed)
NAMEHENDY, [name]               ACCOUNT NO.:  [id number]   MEDICAL RECORD NO.:  [id number]          PATIENT TYPE:  INP   LOCATION:  6732                         FACILITY:  [facility]   PHYSICIAN:  Loreta Ave, MD DATE OF BIRTH:  November 05, 1970   DATE OF PROCEDURE:  11/16/2008  DATE OF DISCHARGE:                               OPERATIVE REPORT   PREOPERATIVE DIAGNOSIS:  Right groin wound.   POSTOPERATIVE DIAGNOSIS:  Right groin wound.   PROCEDURE PERFORMED:  Right rectus femoris muscle flap to right groin  wound and debridement of right groin wound.   ESTIMATED BLOOD LOSS:  100 mL.   URINE OUTPUT:  Not recorded.   IV FLUIDS:  400 mL of crystalloid.   TOURNIQUET TIME:  1 hour and 44 minutes.   DRAINS:  Jackson-Pratt x2 in the right thigh.   COMPLICATIONS:  None.   CLINICAL INDICATION:  [name] is a 40 year old African American  female with end-stage renal disease on hemodialysis.  She previously had  a graft in her right groin, which was access for hemodialysis.  This had  a rupture at the suture line, which led to her being taken emergently to  the operating room roughly 1 week ago for control of hemorrhage and  removal of the graft.  She developed postoperative wound dehiscence at  the surgical site.   After discussion of the risks of surgery which include but are not  limited bleeding, ongoing infection, breakdown of the wound with delayed  healing, failure to control infection, and the need for future surgery,  which she understands these risks and desires to proceed.   DESCRIPTION OF OPERATION:  The patient was brought to the operating  room, placed in the supine position on the operating room table.  After  smooth and routine induction of general anesthesia, the patient's right  thigh, groin, and hemiabdomen were prepped with chlorhexidine and draped  into a sterile field.  A 0.5% Lidocaine with 1:100,000 epinephrine was  injected along the planned incision  lines for the rectus femoris flap  and the margin of the right groin wound.  After waiting for the  hemostatic effects of epinephrine to take hold, the right groin wound  was debrided.  The skin was incised with a 10-blade around the periphery  and the walls were removed with electrocautery.  After removing necrotic  tissue in the base of the wound, the femoral artery and Dacron patch  were exposed at the base.  Culture was taken of the Dacron patch and  sent to microbiology for aerobic and anaerobic cultures.  All necrotic  areas were curetted back to bleeding healthy tissue.  The wound was then  pulse lavaged with 3 liters of normal saline and hemostasis was assured  with electrocautery.  Next, attention was turned to the right thigh.   A linear incision was made along the anterior aspect of the right thigh  and dissection carried down to the quadriceps fascia with  electrocautery.  The rectus femoris muscle was identified and dissected  free.  It was traced distally and disinserted from the patellar  tendon  with electrocautery.  Next, the muscle was lifted up and freed from its  fascial attachments.  Next, subcutaneous tunnel was made under the skin  bridge between the rectus femoris and the right groin wound.  The rectus  femoris muscle was then rotated superiorly to cover the exposed femoral  vessels.  The right thigh incision was irrigated with normal saline.  Hemostasis was assured with electrocautery.  The subcutaneous tunnel was  inspected and found to be large enough to not compress the rectus  femoris muscle.  The rectus femoris muscle was suture with 2-0 Monocryl  sutures to provide coverage of the femoral vessels.  Next, two 19-French  round Blake drains were placed via separate stab incisions inferior to  the right thigh incision.  These were tunneled out through the thigh and  around the rectus muscle in the groin wound.  They were affixed to the  skin with silk sutures.   Next, the quadriceps fascia was closed with 2-0  Monocryl sutures and the skin was closed with vertical mattress, 2-0  nylons, and staples.  Attention was then turned to the right groin  wound.  It was closed in layers.  The deep layer was closed with 2-0  interrupted Monocryl sutures.  The skin was closed with a combination of  1-0 and 2-0 nylon vertical mattress sutures and staples in the skin.  Sponge and needle counts were reported as correct x2.  The patient was  extubated and transported to the recovery room in stable condition.      Loreta Ave, MD  Electronically Signed     CF/MEDQ  D:  11/16/2008  T:  11/17/2008  Job:  [id number]

## 2011-01-13 NOTE — Op Note (Signed)
NAMEMILANIA, [name]               ACCOUNT NO.:  [id number]   MEDICAL RECORD NO.:  [id number]          PATIENT TYPE:  INP   LOCATION:  6703                         FACILITY:  [facility]   PHYSICIAN:  Balinda Quails, M.D.    DATE OF BIRTH:  07/25/1971   DATE OF PROCEDURE:  07/10/2008  DATE OF DISCHARGE:                               OPERATIVE REPORT   SURGEON:  Balinda Quails, MD   ASSISTANT:  Nurse.   ANESTHETIC:  General endotracheal.   ANESTHESIOLOGIST:  Guadalupe Maple, MD   PREOPERATIVE DIAGNOSIS:  End-stage renal failure.   POSTOPERATIVE DIAGNOSIS:  End-stage renal failure.   PROCEDURE:  Ultrasound-guided right internal jugular Diatek catheter.   OPERATIVE PROCEDURE:  The patient was brought to the operating room in  stable condition.  Placed in supine position.  General endotracheal  anesthesia was induced.  Ultrasound of the right neck carried out.  This  revealed a patent right internal jugular vein.   There was normal flow in the vein and normal respiratory variation.   Right neck was prepped and draped in sterile fashion.  An 18-gauge  needle induced in the right internal jugular vein.  Under fluoroscopy,  guidewire advanced in the superior vena cava.  The needle removed, site  opened with an 11-blade.  A 14 and 16 dilators were advanced over the  guidewire.  A 16 dilator and tearaway sheath advanced over the  guidewire.  The dilator was removed.  Catheter was placed through the  sheath to the superior vena cava and right atrial junction, and the  tearaway sheath was removed.  A subcutaneous tunnel was created.  Catheter was brought through the tunnel, divided, and hub mechanism was  assembled.  Catheter was flushed with heparin and saline solution.   Insertion site was closed with interrupted 3-0 nylon suture.  Catheter  was fixed to the skin with interrupted 2-0 silk suture.  Sterile  dressings were applied.   The patient tolerated the procedure well.  No apparent  complications.  Transferred to recovery room in stable condition.      Balinda Quails, M.D.  Electronically Signed     PGH/MEDQ  D:  07/10/2008  T:  07/11/2008  Job:  [id number]

## 2011-01-13 NOTE — H&P (Signed)
NAMEPALIN, TRISTAN               ACCOUNT NO.:  [id number]   MEDICAL RECORD NO.:  [id number]          PATIENT TYPE:  INP   LOCATION:  4711                         FACILITY:  [facility]   PHYSICIAN:  Vania Rea, M.D. DATE OF BIRTH:  09-26-1970   DATE OF ADMISSION:  09/29/2008  DATE OF DISCHARGE:                              HISTORY & PHYSICAL   PRIMARY CARE PHYSICIAN:  Billee Cashing, M.D.   CHIEF COMPLAINT:  Cough for 1 day.   HISTORY OF PRESENT ILLNESS:  This is a 40 year old African-American lady  with a history of diabetes, hypertension, and end stage renal disease  dialysis dependent, who presents to the emergency room complaining of a  cough productive of green sputum for the last 1 day. It is associated  with shortness of breath, chest pain, and coughing. She has had no fever  or chills. The patient is taking prophylactic INH because of a recent  positive PPD. The patient does have a history of MRSA bacteremia as  recently as 2 months ago. She does have a history of a recurrent  infections of dialysis access graft, so much so that she no longer has  upper extremity graft sites available and is scheduled for attempted  lower extremity graft placement in February. The patient also a history  of boils on her body as recently as August 23, 2009, when she was seen  for abscess in her axilla.   PAST MEDICAL HISTORY:  1. Diabetes mellitus.  2. Hypertension.  3. Chronic anemia.  4. GERD.  5. Recurrent bibasilar pneumonia.  6. End stage renal disease, dialysis depending on Monday, Wednesday,      and Friday.   MEDICATIONS:  1. INH 300 mg daily.  2. Pyridoxine 50 mg daily.  3. Aspirin 81 mg daily.  4. Nephro-Vite 1 tab daily.  5. Novolin 70/30, 36 units in the morning and 32 units in the evening.  6. Labetalol 400 mg twice daily.  7. Hydroxyzine 25 mg every 6 hours.  8. Phos-Lo 2001mg  3 times daily.  9. Reglan 10 mg q. a.c. and q.h.s.   ALLERGIES:  NO KNOWN DRUG  ALLERGIES.   SOCIAL HISTORY:  She lives with her 2 children. Denies alcohol, tobacco,  or illicit drug use. 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   FAMILY HISTORY:  Diabetes mellitus, high blood pressure and chronic  kidney disease.   REVIEW OF SYSTEMS:  Other than noted above, significant only for  shortness of breath and relative non-compliance with her medications,  although she says that she takes them regularly. Her blood sugar is  usually in the 200. She reports that actually she reports that she has  taken no insulin for the past 24 hours, because she was late. She says  she does take her Labetalol regularly.   PHYSICAL EXAMINATION:  GENERAL:  An obese, young, African-American lady  lying on the stretcher who looks uncomfortable.  VITAL SIGNS:  Temperature 98.3, pulse 101, respiratory rate 18,  blood  pressure 210/98. She is saturating at 96% on 2 liters.  HEENT:  Pupils are round and equal. Mucous membranes pink and anicteric.  She is mildly dehydrated. No lymphadenopathy or thyromegaly.  NECK:  She has a very thick neck. She has right upper chest dialysis  with dialysis catheter for dialysis access.  CHEST:  She has diffuse rhonchi bilaterally.  CARDIOVASCULAR:  Regular rhythm.  ABDOMEN:  Soft, nontender.  EXTREMITIES:  Without edema. Dorsalis pedis equal and 2+ bilaterally.  SKIN:  Of the lower extremities is dry and scaly.  NEUROLOGIC:  Cranial nerves 2-12 are grossly intact. She has no focal  neurological deficit.   LABORATORY DATA:  White count is slightly elevated at 11.7. Hemoglobin  13.1. Platelets 215,000. Her absolute granulocyte count is 9.3.  Otherwise, unremarkable. Serum chemistry:  Sodium 129, potassium 3.8,  chloride 92, CO2 is 25, glucose is 719. BUN is 25.   DIAGNOSTIC IMPRESSION:  Chest x-ray shows patchy opacities at the  medial  lung base, suspicious for pneumonia, stable cardiomegaly.   ASSESSMENT:  1. BILATERAL PNEUMONIA with bronchospasm:  Will have to be assumed,      based on the fact of difficulty breathing, chest pain with green      sputum, and bilateral lung infiltrates despite the fact that these      seem to have been similar infiltrates present in November when she      was diagnosed and treated for pneumonia.  2. LEUKOCYTOSIS:  Probably related to her pneumonia.  3. DIABETES MELLITUS TYPE 2:  Uncontrolled. Probably related to non-      compliance.  4. HYPERTENSION:  Uncontrolled. Again, probably related to non-      compliance.  5. END STAGE RENAL DISEASE:  Completed dialysis successfully as      scheduled on Friday.  6. HISTORY OF RECURRENT DIALYSIS ACCESS INFECTION:  Without any      clinical evidence of endocarditis.  7. 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  8. HISTORY OF GASTROESOPHAGEAL REFLUX DISEASE/GASTROPARESIS:  On      Reglan.  9. HISTORY OF SECONDARY HYPERPARATHYROIDISM.   PLAN:  Will bring this lady in for initiation of treatment for pneumonia  in an immunocompromised person. Will treat with Vancomycin and Avelox.  She is having bronchospasms and will also give serial nebulization. Will  continue on all of her outpatient medications including the current dose  of Labetalol and monitor her blood pressure. If it is not normalized on  her standard regimen, will consider increasing it. Other plans as per  orders.      Vania Rea, M.D.  Electronically Signed     LC/MEDQ  D:  09/30/2008  T:  09/30/2008  Job:  [id number]   cc:   Lorelle Formosa, M.D.

## 2011-01-13 NOTE — Discharge Summary (Signed)
[name], [name]               ACCOUNT NO.:  [id number]   MEDICAL RECORD NO.:  [id number]          PATIENT TYPE:  INP   LOCATION:  6714                         FACILITY:  [facility]   PHYSICIAN:  Isidor Holts, M.D.  DATE OF BIRTH:  08-15-1971   DATE OF ADMISSION:  09/29/2008  DATE OF DISCHARGE:  10/02/2008                               DISCHARGE SUMMARY   PRIMARY MEDICAL DOCTOR:  Dr. Billee Cashing.   PRIMARY NEPHROLOGIST:  Dr. Darrick Penna.   DISCHARGE DIAGNOSES:  1. Bilateral pneumonia.  2. Type 2 diabetes mellitus.  3. Chronic anemia.  4. End-stage renal disease, on hemodialysis Monday, Wednesday, Friday.  5. Hypertension.  6. Gastroesophageal reflux disease.  7. History of methicillin-resistant Staphylococcus aureus.  8. 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   DISCHARGE MEDICATIONS:  1. Isoniazid 300 mg p.o. daily.  2. Pyridoxine 50 mg p.o. daily.  3. Aspirin 81 mg p.o. daily.  4. Nephro-Vite 1 p.o. daily.  5. Labetalol 200 mg p.o. b.i.d. (was on 400 mg p.o. b.i.d.).  6. Hydroxyzine 25 mg p.o. q.6 hours.  7. PhosLo 2001 mg p.o. t.i.d.  8. Reglan 10 mg p.o. q.a.c. and at bedtime.  9. Humulin 70/30, 20 units subcutaneous q. a.m. and 10 units      subcutaneous q. p.m. (was on 36 units subcutaneous q. a.m. and 22      units subcutaneous q. p.m.)  10.Avelox 400 mg p.o. daily for 7 days.  11.Vancomycin 1 g IV q. hemodialysis, to be completed on October 09, 2008, inclusive.  12.Mucinex 100 mg p.o. b.i.d. for 7 days.  13.Hectorol 1.5 mcg IV Mondays, with hemodialysis.   PROCEDURES:  Chest x-ray September 30, 2008 showed patchy opacities at the  medial lung bases bilaterally.  Findings are suspicious for pneumonia  given the clinical history of shortness of breath and coughing.  Alternately, atelectasis can have a similar appearance.  There was mild  cardiomegaly.   CONSULTATIONS:  Dr. Annie Sable,  nephrologist.   ADMISSION HISTORY:  As in history and physical notes of September 29, 2008, dictated by Dr. Vania Rea.  However, in brief, this is a 40-  year-old female, with known history of Type 2 diabetes mellitus insulin  requiring, hypertension, chronic anemia, GERD, end-stage renal disease  on hemodialysis, history of bibasilar pneumonia November 2009, history  of MRSA bacteremia/infected left upper extremity AV graft December 2009,  presenting with new onset history of cough, productive of greenish  phlegm, and  associated with shortness of breath.  The patient was  apyrexial.  However, on initial evaluation, chest x-ray was found to  demonstrate bilateral patchy airspace opacities.  In addition, white  cell count was mildly elevated at 11.7.  The patient was, therefore,  admitted for further evaluation, investigation, and management.   CLINICAL COURSE:  1. Bilateral pneumonia.  This is community acquired.  However, the      patient does have a known history of MRSA infected AV graft      November 2009, complicated by MRSA  bacteremia.  She was, therefore,      started on intravenous combination of Vancomycin and Avelox.      Symptomatic treatment was provided with Mucinex, bronchodilator      nebulizers as needed, and oxygen supplementation.  Clinical      response was rapid and dramatic.  By October 02, 2008, the      patient's white cell count had normalized at 8.8.  She had no      episodes of pyrexia during the course of her hospitalization, and      showed marked improvement in well being.  As a matter of fact, on      October 02, 2008, she was very keen to be discharged.  She has,      therefore, been transitioned to oral Avelox and continued on      intravenous Vancomycin with hemodialysis, to be completed on      October 09, 2008, i.e. following a total of 10 days of antibiotic      treatment.   1. Type 2 diabetes mellitus.  This is insulin requiring.  The  patient      was initially commenced on pre-admission doses of insulin.      However, she did have a hypoglycemic event with a CBG of 28 in the      a.m. of October 01, 2008.  Insulin dosage has, therefore, been      adjusted downwards.  We shall defer further titration of insulin      regimen to the patient's primary M.D, following discharge.   1. History of chronic anemia.  The patient's hemoglobin at the time of      presentation, was normal at 13.1 and remained stable during the      course of her hospitalization.  Hemoglobin was 12.9 in a.m. of      October 02, 2008.   1. Hypertension.  The patient is a known hypertensive, and pre-      admission, was on Labetalol in a dose of 400 mg p.o. b.i.d.      However, on October 01, 2008, she was found to be somewhat      hypotensive with a blood pressure of 89/20.  This necessitated      holding Labetalol, and, by October 02, 2008, BP had normalized at      138/93.  Labetalol has been reinstated in a dose of 200 mg p.o.      b.i.d, until reevaluated by primary M.D.   1. End-stage renal disease.  Nephrology consultation was kindly      provided by Dr. Annie Sable.  The patient was continued on      her regular schedule during the course of this hospitalization, and      she remained clinically stable from this viewpoint.   1. Gastroesophageal reflux disease.  There were no problems referable      to this.   1. Positive purified protein derivative.  The patient was diagnosed      with a positive PPD in November 2009, and was commenced on      prophylactic therapy with INH, which we have continued during this      hospitalization.  Total course of therapy is 9 months.   DISPOSITION:  The patient was, on October 02, 2008, considered  clinically stable for discharge.  She was really keen to be discharged.  She was, therefore, discharged accordingly.   DIET:  Renal: 60-2-2.   ACTIVITY:  As tolerated.   FOLLOWUP INSTRUCTIONS:   The patient is instructed to follow up with her  primary M.D., Dr. Billee Cashing, within 1 to 2 weeks of discharge.  She has been instructed to call for an appointment.  In addition, she  has been instructed to follow up with her primary nephrologist, Dr.  Darrick Penna, per prior scheduled appointment.   SPECIAL INSTRUCTIONS:  The patient is to continue on her regular  dialysis schedule, ie Monday, Wednesday, Friday.  Dr. Ronne Binning is  recommended to repeat a followup chest x-ray in approximately 1 week's  time, to assess for interval resolution of the patient's bilateral  pneumonia.  All of this has been communicated to the patient.  She  verbalized understanding.      Isidor Holts, M.D.  Electronically Signed     CO/MEDQ  D:  10/02/2008  T:  10/02/2008  Job:  [id number]   cc:   Lorelle Formosa, M.D.  Dr. Darrick Penna

## 2011-01-13 NOTE — Procedures (Signed)
CEPHALIC VEIN MAPPING   INDICATION:  Graft failure.   HISTORY:  Renal failure.   EXAM:  The right cephalic vein is compressible.   Diameter measurements range from 0.26 to 0.43 in the forearm.   The right basilic vein is compressible, diameter measurements range from  0.32 to 0.62 cm.   The left cephalic vein is compressible.   Diameter measurements range from 0.26 to 0.27 cm.   The left Basilic vein compressible with diameter measurements from 0.27  to 0.44 cm in the upper arm.   See attached worksheet for all measurements.   IMPRESSION:  Patent right basilic vein which is acceptable diameter for  use as a dialysis access graft.   ___________________________________________  [name]. Hart [place], M.D.   CJ/MEDQ  D:  07/30/2009  T:  07/31/2009  Job:  [id number]

## 2011-01-13 NOTE — Assessment & Plan Note (Signed)
OFFICE VISIT   ASMARA, BACKS  DOB:  May 14, 1971                                       10/22/2008  [id number]#:[id number]   REASON FOR VISIT:  Postop.   HISTORY:  This is a 40 year old female with end-stage renal disease who  underwent a right thigh graft placed by Dr. Darrick Penna on October 18, 2008.  She comes in today because she was told at dialysis that her right leg  is swollen.  She is also complaining of a lot of pain as well as  numbness in her toes.  On examination, there is a mild amount of  swelling in the region of the tunneling portion of the graft.  Her right  leg is not more swollen than her left.  She has symmetric function of  both feet.  Sensation is intact bilaterally.   DIAGNOSTIC STUDIES:  Ultrasound was performed today which reveals  biphasic Doppler waveforms with noncompressible vessels in both lower  extremities.   ASSESSMENT/PLAN:  Status post right thigh graft.  I think the patient's  complaints are mostly secondary to pain from the tunneling of her graft.  There is no evidence of infection.  There is no evidence of a steal  syndrome.  I am going to have the patient come back to see Dr. Darrick Penna in  2 weeks to make sure that she is doing okay at that time.   Jorge Ny, MD  Electronically Signed   VWB/MEDQ  D:  10/22/2008  T:  10/23/2008  Job:  [id number]   cc:   Janetta Hora. Fields, MD  [facility]

## 2011-01-13 NOTE — Op Note (Signed)
[name], [name]               ACCOUNT NO.:  [id number]   MEDICAL RECORD NO.:  [id number]          PATIENT TYPE:  INP   LOCATION:  6703                         FACILITY:  [facility]   PHYSICIAN:  Balinda Quails, M.D.    DATE OF BIRTH:  Mar 07, 1971   DATE OF PROCEDURE:  07/08/2008  DATE OF DISCHARGE:                               OPERATIVE REPORT   SURGEON:  Balinda Quails, MD   ASSISTANT:  Nurse.   ANESTHETIC:  General endotracheal.   ANESTHESIOLOGIST:  Dr. Jean Rosenthal.   PREOPERATIVE DIAGNOSIS:  Infected left upper arm arteriovenous graft.   POSTOPERATIVE DIAGNOSIS:  Infected left upper arm arteriovenous graft.   PROCEDURE:  1. Removal of infected left upper arm arteriovenous graft.  2. Bovine patch repair, left brachial artery.   OPERATIVE PROCEDURE:  The patient brought to the operating room in  stable condition.  Placed under general endotracheal anesthesia.  Left  arm prepped and draped in sterile fashion.   Longitudinal skin incision made through the left antecubital fossa.  Dissection carried down to expose the proximal arterial limb of the  graft.  This was encircled with the vessel loop and controlled with a  fistula clamp.  A second skin incision made through the scar in the left  axilla.  Dissection carried down to expose the venous anastomosis.  The  graft also clamped here with a fistula clamp.   Longitudinal skin incision was then made over the abscess cavity  adjacent to the graft.  This was dissected down to expose what appeared  to be an infected hematoma.  The graft was ruptured and no longer in  continuity.  The cavity was cultured.  The graft then removed in total  from the subcutaneous tissue in the left upper arm.  The vein then  ligated in the axilla and the graft removed.  The graft dissected down  to an end-to-side anastomosis to the brachial artery.  The patient  administered 3000 units of heparin intravenously.  The brachial artery  controlled  proximally and distally with bulldog clamps.  The graft  completely excised from the brachial artery and the brachial artery  repaired with a bovine patch using running 6-0 Prolene suture.  Clamps  were then removed.   Adequate hemostasis obtained.  Sponges and instrument counts correct.  The incisions were closed with a subcutaneous layer of interrupted 2-0  Vicryl suture and superficial layer of interrupted 3-0 nylon.  Dressing  of 4x4s, Kerlix and Ace wrap applied.  The patient tolerated procedure  well.  Transferred to recovery room in stable condition.      Balinda Quails, M.D.  Electronically Signed     PGH/MEDQ  D:  07/08/2008  T:  07/08/2008  Job:  [id number]

## 2011-01-13 NOTE — Assessment & Plan Note (Signed)
OFFICE VISIT   [name], [name]  DOB:  Jul 17, 1971                                       07/30/2009  EAVWU#:[id number]   The patient is a 40 year old female with end-stage renal disease being  evaluated for further vascular access referred by Dr. Darrick Penna.  The  patient has had multiple access procedures in both upper extremities  including fistulas and grafts up to the axillary level and has no  further areas to insert grafts in either arm.  She has had a right thigh  graft which became infected and it was removed urgently in March of this  year requiring repair of the femoral vessels.  She has never had a graft  in the left thigh and has refused any discussion regarding that.  She  was referred by Dr. Darrick Penna today to evaluate for further fistula  creation.   Stable chronic medical problems include:  1. End-stage renal disease.  2. Hypertension.  3. Diabetes mellitus - type 1.  4. Secondary hyperparathyroidism.  5. Gastroesophageal reflux disease.   REVIEW OF SYSTEMS:  The patient denies any chest pain, dyspnea on  exertion, PND, orthopnea, chronic bronchitis, productive cough,  pneumonia but does have headaches.   SOCIAL HISTORY:  The patient is single, has 2 children.  Does not use  tobacco or alcohol.   PHYSICAL EXAM:  Vital signs:  Blood pressure 129/95, heart rate 92,  respirations 14, temperature 97.4.  General:  She is a chronically ill-  appearing African American female who is in no apparent distress, alert  and oriented x3.  HEENT:  Exam unremarkable.  Neck:  Is supple, 3+  carotid pulses.  No bruits audible.  There is a hemodialysis calf  exiting in the right anterior chest wall from the right internal jugular  vein entrance site.  No purulence is noted.  Chest:  Clear to  auscultation.  Cardiovascular:  Regular rhythm.  No murmurs.  Abdomen:  Obese.  No palpable masses.  Upper extremity exam reveals multiple  thrombosed AV grafts in  the forearms and upper arms bilaterally.   I ordered and interpreted vein mapping today done in the vascular lab.  There are no adequate veins in either upper extremity for AV fistula  creation and she has had AV grafts in the axillary veins bilaterally  which have thrombosed and are not revisable.   This patient is not a candidate for further upper extremity access.  She  refuses left lower extremity access.  She states that the catheter is  not working well and it needs to be exchanged which we will schedule for  next Tuesday by Dr. Edilia Bo.  The only potential access for her in the  future would be a left thigh graft which she does not wish to discuss at  this time.   [name] Hart [place], M.D.  Electronically Signed   JDL/MEDQ  D:  07/30/2009  T:  07/31/2009  Job:  [id number]

## 2011-01-13 NOTE — Op Note (Signed)
NAMENORLENE, LANES               ACCOUNT NO.:  [id number]   MEDICAL RECORD NO.:  [id number]          PATIENT TYPE:  INP   LOCATION:  6732                         FACILITY:  [facility]   PHYSICIAN:  Juleen China IV, MDDATE OF BIRTH:  [birth year]   DATE OF PROCEDURE:  11/06/2008  DATE OF DISCHARGE:                               OPERATIVE REPORT   PREOPERATIVE DIAGNOSIS:  Right femoral pseudoaneurysm.   POSTOPERATIVE DIAGNOSIS:  Right femoral pseudoaneurysm.   PROCEDURES PERFORMED:  1. Removal of right femoral arteriovenous Gore-Tex graft.  2. Patch angioplasty, right common femoral artery and right      superficial femoral artery.  3. Right common femoral endarterectomy.  4. Embolectomy of right femoral artery.  5. Primary repair of right femoral vein.   TYPE OF ANESTHESIA:  General.   BLOOD LOSS:  800 mL.   FINDINGS:  Completely dehisced arterial anastomosis.   SPECIMENS:  Graft and femoral artery for culture.   DRESSINGS:  Betadine-soaked gauze and Kerlix.   INDICATIONS:  This is a 40 year old female with end-stage renal disease  who has previously undergone a right femoral AV Gore-Tex graft.  She  presented to the hospital with purulent drainage and bleeding.  She was  taken to the operating room for repair of a bleeding right femoral  pseudoaneurysm.   PROCEDURE:  The patient was identified in the holding area and taken to  room #6.  She was placed supine on the table.  General endotracheal  anesthesia was administered.  The patient was prepped and draped in  standard sterile fashion.  A time-out was called.  The patient had a  large pseudoaneurysm visible with skin breakdown.  I elected to make a  longitudinal incision proximal to the pseudoaneurysm.  The soft tissue  was divided with Bovie cautery where I did identify the external oblique  aponeurosis.  This was followed down to the inguinal ligament.  The  tissue was divided in this area over the palpable pulse of  the common  femoral artery.  The common femoral artery was then circumferentially  mobilized and encircled with a vessel loop.  The vessel loop was then  tightened for hemostasis.  I next, a second longitudinal incision below  the pseudoaneurysm and attempt to get distal control.  Through this  incision, the venous and the arterial limb of the graft were identified.  In trying to mobilize the superficial femoral artery, the pseudoaneurysm  was entered.  Digital pressure was used for hemostasis.  I had a  difficult time with the patient due to her body habitus.  I was able to  ultimately expose the common femoral artery and realized that the  arterial anastomosis had completely separated.  Next, the common, the  superficial femoral, and the profunda femoral artery were individually  isolated.  Vascular clamps were then placed.  Once I had control, I gave  the patient 3000 units of heparin and she was later given an additional  2000 units of heparin.  There was significant inflammatory reaction  around the common femoral artery.  The intima of the superficial femoral  and common femoral artery had become completely disrupted and therefore  I had to perform a limited endarterectomy, removing the intima from the  distal common femoral artery and proximal superficial femoral artery.  I  ended up placing two 7-0 tacking sutures in the  superficial femoral  artery.  I made sure that there was no flap covering the profunda  femoral artery.  At this point in time, I set up for patch angioplasty,  I did have to extend the arteriotomy down further about 2 cm on the  superficial femoral artery and about 1 cm onto the common femoral  artery.  There was good back bleeding from the profunda femoral artery.  I passed a #4 Fogarty and did not was evacuate any thrombus.  I then  passed #4 Fogarty down the superficial femoral artery and a significant  amount of thrombus was evacuated.  The #4 Fogarty was  passed multiple  times until no thrombus was observed.  There was good backbleeding.  Next, the arterial bed was irrigated with heparinized saline to make  sure there was no potential embolic debris and there were no mobile  flaps.  I did not see an adequate vein in this area and elected to use  bovine pericardial patch for patch angioplasty.  The patch angioplasty  was performed on the distal common femoral artery and superficial  femoral artery using a running  5-0 Prolene.  Prior to completion of the  anastomosis a final pass was made in the  superficial femoral artery  with a #4 fogarty.  Again, I did not evacuate any thrombus.  There was  good backbleeding.  The anastomosis was then completed.  The proximal  clamp was released.  This was followed by the profunda and the  superficial femoral artery clamps.  Hemostasis was adequate.  I then  reversed the heparin with 50 mg of protamine.  Next, I proceeded with  removing the graft from the common femoral vein.  I was able to perform  primary closure of the common femoral vein with a running 5-0 Prolene.  The graft was then completely removed and sent to the lab for  microbiology.  I did have to debride part of the common femoral artery  and the superficial femoral artery.  This tissue was sent as specimen as  well.  Next, we inspected the foot and the patient had a return of a  palpable dorsalis pedis artery.  The patient had significant skin  ulceration at the necrotic and nonviable tissue was all debrided  sharply.  I did not feel like it was safe to close this incision and  therefore I reapproximated tissue over the femoral artery and femoral  vein so that it was completely covered.  I packed the rest of the  incision with Betadine-soaked gauze.  After the dressings were  completed, the patient was awakened from anesthesia and taken to  recovery room in stable condition.  There were no complications.            ______________________________  V. Charlena Cross, MD  Electronically Signed     VWB/MEDQ  D:  11/06/2008  T:  11/07/2008  Job:  [id number]

## 2011-01-13 NOTE — Op Note (Signed)
NAMESKYANNE, WELLE               ACCOUNT NO.:  [id number]   MEDICAL RECORD NO.:  [id number]           PATIENT TYPE:   LOCATION:                                 FACILITY:   PHYSICIAN:  Nadara Mustard, MD     DATE OF BIRTH:  1971/07/22   DATE OF PROCEDURE:  03/04/2009  DATE OF DISCHARGE:                               OPERATIVE REPORT   PREOPERATIVE DIAGNOSIS:  Gangrene and osteomyelitis, left heel.   POSTOPERATIVE DIAGNOSIS:  Gangrene and osteomyelitis, left heel.   PROCEDURE:  1. Partial calcaneal excision.  2. Application of wound VAC.   SURGEON:  Nadara Mustard, MD   ANESTHESIA:  LMA.   ESTIMATED BLOOD LOSS:  Minimal, antibiotics obtained preoperatively.   DRAINS:  None.   COMPLICATIONS:  None.   TOURNIQUET TIME:  None.  Cultures obtained x2.   DISPOSITION:  To PACU in stable condition.   INDICATION FOR PROCEDURE:  The patient is a 40 year old woman with end-  stage renal disease on dialysis with peripheral vascular disease and  diabetes.  The patient has a decubitus gangrenous heel ulcer with  abscess and osteomyelitis.  Discussed transtibial amputation verses  attempted foot salvage surgery with the above-mentioned procedure.  Discussed that the risks of success would be left than 50:50.  The risks  and benefits of surgery were discussed including infection,  neurovascular injury, persistent pain, nonhealing of the wound, need for  transtibial amputation.  The patient states she understands and wished  to proceed at this time.   DESCRIPTION FOR PROCEDURE:  The patient was brought to OR room #4 and  underwent a general anesthetic.  After adequate level of anesthesia  obtained, the patient's left lower extremity was prepped using DuraPrep  and draped into a sterile field.  A posterior and lateral incision was  used and the gangrenous tissue was ellipsed out in one block of tissue.  There was a purulence down on the calcaneus and cultures were obtained.  Osteotome  was used to excise approximately 50% of the calcaneus.  This  was excised in one block of tissue.  The wound was irrigated with normal  saline.  The remainder of the tissue showed there to be viable tissue.  There was good bleeding.  The wound was closed using a far-near-near-far  suture with 2-0 nylon.  The wound was then covered with a wound VAC  and set to minus 75 mmHg.  This had good suction.  The patient was  extubated and taken to PACU in stable condition.  Plan for nitroglycerin  patch and Medinex, wound VAC and will plan for wound VAC changes three  times a week.      Nadara Mustard, MD  Electronically Signed     MVD/MEDQ  D:  03/04/2009  T:  03/04/2009  Job:  [id number]

## 2011-01-13 NOTE — Op Note (Signed)
[name], [name]               ACCOUNT NO.:  [id number]   MEDICAL RECORD NO.:  [id number]          PATIENT TYPE:  INP   LOCATION:  6711                         FACILITY:  [facility]   PHYSICIAN:  Nadara Mustard, MD     DATE OF BIRTH:  [birth year]   DATE OF PROCEDURE:  04/04/2009  DATE OF DISCHARGE:                               OPERATIVE REPORT   PREOPERATIVE DIAGNOSIS:  Gangrene, left hind foot.   POSTOPERATIVE DIAGNOSIS:  Gangrene, left hind foot.   PROCEDURE:  Left transtibial amputation.   SURGEON:  Nadara Mustard, MD   ANESTHESIA:  General.   ESTIMATED BLOOD LOSS:  Minimal.   ANTIBIOTICS:  1 g of Kefzol.   DRAINS:  None.   COMPLICATIONS:  None.   TOURNIQUET TIME:  None.   DISPOSITION:  To PACU in stable condition.   INDICATIONS FOR PROCEDURE:  The patient is a 40 year old woman with  diabetes and end-stage renal disease, currently on dialysis.  She is  status post attempted foot salvage surgery with partial calcaneal  excision.  The patient progressed well for a while; however, she  developed progressive gangrenous necrotic changes to the heel flap and  now has a complete gangrenous hind foot.  Due to the loss of integrity  of the hind foot, the patient presents at this time for transtibial  amputation.  Risks and benefits were discussed including infection,  neurovascular injury, nonhealing of the wound, need for additional  surgery.  The patient states she understands and wished to proceed at  this time.   DESCRIPTION OF PROCEDURE:  The patient was brought to OR room 10 and  underwent a general anesthetic.  After adequate level of anesthesia  obtained, the patient's left lower extremity was prepped using DuraPrep  and draped into a sterile field.  A transverse incision was made 10 cm  distal to tibial tubercle was curved proximally and a large posterior  flap was created.  The tibia was transected 1 cm proximal to skin  incision.  The fibula was transected 1 cm  proximal to tibia incision.  A  hemostat was used to clamp the neurovascular bundle and the popliteal  fossa.  Amputation knife was used to create a large posterior flap.  The  sciatic nerve was then pulled, cut, and allowed to retract.  The  vascular bundles in the popliteal fossa were suture ligated with 2-0  silk x3 each.  The anterior compartment vascular structures were suture  ligated x2 with a 2-0 silk.  Hemostasis was obtained.  The wound was  irrigated with normal saline.  The anterior aspect of the tibia was  beveled.  The deep and superficial  fascial layers were closed using #1 PDS.  The skin was closed using  approximating staples.  The wound was covered with Adaptic orthopedic  sponges, ABD dressing, Kerlix, and Coban.  The patient was extubated,  taken to PACU in stable condition.  Plan for dialysis.      Nadara Mustard, MD  Electronically Signed     MVD/MEDQ  D:  04/04/2009  T:  04/05/2009  Job:  [id number]

## 2011-01-13 NOTE — Assessment & Plan Note (Signed)
OFFICE VISIT   SHADELL, BRENN  DOB:  1970/10/24                                       08/02/2008  ZOXWR#:[id number]   The patient underwent removal of an infected left upper arm AV graft  07/08/2008 at [facility].  These wounds continued to heal  satisfactorily.  No apparent complicating features.  Left brachial pulse  palpable without distal pulses.  She has a right IJ Diatek catheter in.  Continue current treatment.  Once infection has been cleared will need  reevaluation for graft placement.   Balinda Quails, M.D.  Electronically Signed   PGH/MEDQ  D:  08/02/2008  T:  08/03/2008  Job:  [id number]

## 2011-01-13 NOTE — Assessment & Plan Note (Signed)
OFFICE VISIT   Mary, Wang  DOB:  07-24-1971                                       02/20/2010  ZOXWR#:[id number]   I saw the patient in the office today for followup.  She had a left  thigh AV graft placed by Dr. Darrick Penna on 01/14/2010.  She had developed a  wound in the groin and was set up for a followup visit.  She states that  she has had no fever or chills.  They have begun to use her graft and it  has been working well.  She is apparently scheduled to get her Diatek  catheter out next week.   On examination the graft has an excellent bruit and thrill.  The distal  counter incision has healed nicely.  The groin incision has an open area  of approximately 3 cm in length with granulation tissue.  I used some  silver nitrate on this.  I probed this with a Q-tip and it did not  appear to go deep.  She seems to be doing a good job of keeping the  wound clean with soap and water using Dial soap soak and keeping a dry  dressing on it.  I will plan on seeing her back in two weeks to make  sure this is continuing to heal adequately.  She knows to call sooner if  she has problems.     Di Kindle. Edilia Bo, M.D.  Electronically Signed   CSD/MEDQ  D:  02/20/2010  T:  02/21/2010  Job:  [id number]

## 2011-01-13 NOTE — Discharge Summary (Signed)
NAMEPEYSON, DELAO               ACCOUNT NO.:  [id number]   MEDICAL RECORD NO.:  [id number]          PATIENT TYPE:  INP   LOCATION:  6732                         FACILITY:  [facility]   PHYSICIAN:  Cecille Aver, M.D.DATE OF BIRTH:  Oct 26, 1970   DATE OF ADMISSION:  11/05/2008  DATE OF DISCHARGE:  11/24/2008                               DISCHARGE SUMMARY   DISCHARGE DIAGNOSES:  1. Right groin arteriovenous graft infection with cultures positive      for methicillin-resistant Staphylococcus aureus and Escherichia      coli.  2. Aspiration pneumonitis.  3. Vaginal Trichomonas infection.  4. Acute blood loss anemia.  5. Anemia of chronic disease.  6. Diarrhea.  7. End-stage renal disease on hemodialysis Monday, Wednesday, and      Friday.  8. Type 2 diabetes mellitus.  9. Hypertension.  10.Secondary hyperparathyroidism.  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  12.History of methicillin-resistant Staphylococcus aureus,      arteriovenous graft infection, and bacteremia in November 2009.  13.Gastroesophageal reflux disease .  14.History of bilateral pneumonia in January 2010.   DISCHARGE MEDICATIONS:  1. Norvasc 10 mg p.o. daily.  2. PhosLo 660 mg 3 tablets p.o. q.a.c.  3. Hectorol 0.5 mcg IV qHD.  4. Novolin 70/30 22 units in the morning and 10 units at night.  5. INH 300 mg p.o. daily through April 04, 2009.  6. Pyridoxine 50 mg p.o. daily through April 04, 2009.  7. Reglan 10 mg before every meal and at bedtime.  8. Augmentin 500/125 mg p.o. b.i.d. through November 30, 2008.  9. Labetalol 400 mg p.o. b.i.d.  10.Nephro-Vite p.o. daily.  11.Aspirin 81 mg p.o. daily.  12.Erythropoietin 9000 units qHD.   Hemodialysis orders, the patient will have a two K bath, 4 hours,  standard heparin, blood flow rate 400/800.   Her estimated dry weight  is 73 kg.   CONSULTATIONS:  Vascular Surgery was consulted during this  hospitalization along with  Plastic Surgery.   PROCEDURES:  1. Dr. Myra Gianotti with Vascular Surgery performed a removal of her right      groin AV graft with patch angioplasty, CFA endarterectomy, primary      repair of right CFV, and right groin debridement the day after      admission.  2. Dr. Noelle Penner of Plastic Surgery performed a right rectus femoris      muscle flap to right groin wound and debridement of right groin on      November 16, 2008.   ADMISSION HISTORY AND PHYSICAL:  Please refer to H&P dictated on November 06, 2008.  In short, the patient is a 40 year old female with past  medical history of end-stage renal disease on hemodialysis, diabetes,  and MRSA bacteremia with a history of MRSA graft infection 4 months  prior to admission who presented to the emergency room noting purulent  discharge along with bleeding from her right groin AVG.   HOSPITAL COURSE:  1. Right groin AVG infection.  The patient was hospitalized and placed      on IV vancomycin and Zosyn.  Her cultures returned positive for      MRSA and E-coli.  The wound was debrided by Vascular Surgery and      her AV graft was removed the day after admission.  She was      continued on vancomycin and Zosyn and clinically responded however,      her wound still was not healing appropriately and did have dark      necrotic margins develop.  Plastic Surgery was consulted and Dr.      Noelle Penner performed a muscle flap along with debridement of the area.      Two JP drains were placed.  During this hospitalization, the      patient was switched from Zosyn to ceftazidime and began spiking      temperature again so she was switched back to Zosyn.  During that      same time her white count began to increase but once she was placed      back on Zosyn her white count began to trend down and she became      afebrile.  She was placed on Bactrim the day prior to discharge      because the E-coli and MRSA were both sensitive to Bactrim.      However, her white  count increased overnight and her temperature      curve began to increase.  We had initially discussed with the      patient about keeping her another night since her white count and      temperature had started to creep up but she was insisting that she      wanted to go home.  Given that we felt like the only thing we would      not be covering would be anaerobes and so we switched her      antibiotics to Augmentin.  She received 2-3 weeks of IV antibiotics      before discharge.  She will continue antibiotics for a total of 2      weeks after her last operation by Dr. Noelle Penner on November 16, 2008.  We      will follow up a CBC 2 days after discharge with her dialysis.  Her      pain was well-controlled with Tylenol during this hospitalization      and her JP drains were draining a small amount of serosanguineous      fluid.  She will need to follow up with Dr. Noelle Penner 1 week after      discharge for a staple removal.  2. Aspiration pneumonitis.  After the patient's initial surgery, her      O2 sats decreased into the 90-92% range where they had been      previously in the 98-100% range.  She also spiked temperatures      despite broad-spectrum antibiotics.  A CT of her abdomen and pelvis      was ordered because it was felt like she may have an intra-      abdominal extension of her right groin infection and was negative      for any intra-abdominal process.  However, her right lower lobe was      noted to have some particulate matter and it was felt like she had      an aspiration pneumonitis after undergoing general anesthesia.  She      did not have cough or any other significant symptoms when she was  on broad-spectrum antibiotics.  Her O2 sats improved and were      normal at the time of discharge and she never developed any      significant respiratory symptoms.  3. Vaginal trichomonal infection.  The patient was noted to have      significant purulent discharge from her vaginal  area the day after      admission after her surgery.  Because she was febrile likely      related to right groin AVG infection, a CT of her abdomen and      pelvis were ordered and was negative for any intra-abdominal      process.  A speculum exam was done by myself and she was noted to      have copious purulent discharge and it was sent for GC, chlamydia,      and wet prep.  The wet prep returned with too many to count      Trichomonas and she was treated with a one time 2 gram dose of      Flagyl.  Her vaginal discharge improved markedly.  She was also      tested for HIV and was negative.  4. Acute blood loss anemia.  The patient's hemoglobin dropped      significantly after her initial surgery.  She was transfused a      total of 4-5 units during this hospitalization.  She did not have      any evidence of melena or hematochezia.  It was felt like her acute      blood loss was likely related to her wound.  5. Anemia of chronic disease.  The patient has known anemia of chronic      disease and is on Epogen in the outpatient setting.  This was      continued, however, she was not started on iron therapy because of      her infection.  6. Diarrhea.  The patient had significant diarrhea during this      hospitalization.  It was felt like it was likely related to either      Zosyn or C.  diff.  She was treated empirically for C. diff several      days and her diarrhea did not improve.  Zosyn was changed to      ceftazidime and her diarrhea did improve but unfortunately she      began spiking temperature and her white count began to increase so      we had to switch her back to Zosyn.  C. diff was negative x3.  She      was started on probiotics and her diarrhea had improved.  At the      time of discharge, her diarrhea was under much better control and      she will be sent home on Augmentin.  7. End-stage renal disease on hemodialysis Monday, Wednesday, Friday      through right IJ  PermCath.  She did not miss any dialysis sessions      during this hospitalization and she was kept on her routine Monday,      Wednesday, and Friday schedule.   DISCHARGE VITALS:  Temperature 98.5, blood pressure 118/79, heart rate  98, respiratory rate 18, O2 sat 100% on room air.   DISCHARGE LABORATORY DATA:  Blood cultures negative x2.  White blood  cell count 21.8, hemoglobin 10.2, MCV 86.4, platelet count 211.  Sodium  132, potassium 3.4,  chloride 98, bicarb 25, BUN 16, creatinine 3.82,  glucose 121, and calcium 9.0.   DISPOSITION AND FOLLOW UP:  The patient is to follow up with Dr. Noelle Penner  in 1 week after discharge.  At that time her right groin wound needs to  be assessed and she may need to have her staples removed.  The patient  is also to follow up for hemodialysis 2 days after discharge and at that  office visit, she will have her routine monthly labs drawn including a  CBC with diff to evaluate her white count.      Joaquin Courts, MD  Electronically Signed     ______________________________  Cecille Aver, M.D.    VW/MEDQ  D:  11/26/2008  T:  11/27/2008  Job:  [id number]   cc:   Dr. Jimmye Norman. Charlena Cross, MD  Loreta Ave, MD

## 2011-01-13 NOTE — Discharge Summary (Signed)
[name], [name]               ACCOUNT NO.:  [id number]   MEDICAL RECORD NO.:  [id number]          PATIENT TYPE:  INP   LOCATION:  6714                         FACILITY:  [facility]   PHYSICIAN:  Altha Harm, MDDATE OF BIRTH:  1971/04/09   DATE OF ADMISSION:  02/23/2009  DATE OF DISCHARGE:  03/11/2009                               DISCHARGE SUMMARY   DISCHARGE DISPOSITION:  Home.   FINAL DISCHARGE DIAGNOSES:  1. Partial calcaneal excision of the left heel on March 04, 2009 by Dr.      Lajoyce Corners.  2. End-stage renal disease.  3. Diabetes type 2 with hypoglycemia.  4. Enterococcal wound infection, question contamination.  5. 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  6. Left osteomyelitis/cellulitis.  7. Early sepsis resolved.  8. Anemia of chronic disease.  9. Secondary hypoparathyroidism.  10.History of right groin arteriovenous graft infection with      methicillin-resistant staph aureus Escherichia coli.  11.Diarrhea resolved.  12.Leukocytosis.  13.Gait disturbance.   DISCHARGE MEDICATIONS:  1. Reglan 10 mg p.o. a.c. and h.s.  2. Pyridoxine 50 mg p.o. q.h.s.  3. Isoniazid 300 mg p.o. q.h.s. under the care of health department.  4. Insulin 70/30 ten units subcu 2 times a day.  5. Diltiazem 240 mg p.o. daily.  6. Avelox 400 mg p.o. daily times 11 days.  7. Nephro-Vite 1 tablet p.o. q.h.s.  8. Renagel 1600 mg p.o. t.i.d.  9. Silvadene applied topically to the wound area.  10.Promod 5 grams p.o. t.i.d.  11.Hectorol 0.5 mcg IV Monday, Wednesday and Friday at dialysis.  12.Aranesp 100 mcg IV on Wednesday dialysis.   CONSULTANTS:  1. Dr. Darrick Penna, Nephrology.  2. Dr. Lajoyce Corners, Orthopedic surgery.  3. Dr. Darlina Sicilian, Infectious Diseases.  4. Vascular Surgery.   PROCEDURES:  1. Partial calcaneal excision of the left heel on March 04, 2009 by Dr.      Aldean Baker.  2. Hemodialysis.  3. Angiogram of the left that shows no  significant disease by      angiogram.  4. Iliac artery widely patent at 20-25% stenosis proximal/mid      superficial femoral artery.  Profunda widely patent.  Popliteal      artery widely patent, three-vessel runoff.  Focal 40-50% stenosis      in mid AT.   CODE STATUS:  Full code.   ALLERGIES:  NO KNOWN DRUG ALLERGIES.   CHIEF COMPLAINT:  Fever, chills and pain to the right foot.   HISTORY OF PRESENT ILLNESS:  Please the H and P dictated by Dr. Orvan Falconer  for details of the HPI.   HOSPITAL COURSE:  1. Osteomyelitis of the left and a large grade 3 ulcer of the left      foot.  The patient was started on antibiotics on admission.  She      was seen by Dr. Lajoyce Corners of Orthopedic Surgery and underwent partial      calcaneal resection.  The patient had the wound dressed with      Silvadene due to infection.  She has had a wound culture sent which      grew out Enterococcus.  The patient was treated with Rocephin and      is being sent home on Moxifloxacin for a total of 14 days of      treatment.  It is unclear as to whether it is due to the infection      or contamination.  However, given her immunocompromise state, ID      agrees to treating her for a total of 14 days.  2. Diabetes type 2.  The patient, during the hospitalization, has had      poor oral intake.  Even on minimal doses of insulin, the patient      has had drops in her blood sugars, down to as low as 22.  The      patient has been on Labetalol for blood pressure and heart rate      control.  I felt that given the patient's profound hypoglycemia      that the beta-blocker was likely contributing to this, so the p.o.      dose of beta-blocker has been changed to a calcium channel blocker.      The patient in the last 24 hours has maintained her blood sugars in      the 140s to 180s.  3. Hypertension.  The patient had been on Labetalol for her      hypertension and tachycardia.  However, this was continued due to      the  episodes of hypoglycemia and she has been started on Cardizem      240 mg p.o. daily.  4. End-stage renal disease:  The patient has continued on her dialysis      as she is tolerating this well.  She is on the above stated      medications and will continue her dialysis on Monday, Wednesdays      and Fridays.  5. Positive PPD.  The patient had been on Isoniazid under the      supervision of the health department.  She has continued it here in      the hospital, referred to the supervision of the health department      for her continued INH.  6. Persistent leukocytosis.  The patient had a leukocytosis of 33,000      upon arrival to the hospital.  Following her resection and      antibiotics, the white blood cell count has started trending      downwards.  It is now down to 17.3 on the day of discharge.      However, the patient is known to have a persistent leukocytosis      upon the etiology had been worked up in the past.  The patient has      otherwise been stable.  7. In terms of her left calcaneal heel resection, the patient was seen      by Orthotics and was fitted with a left PRAFO.  The patient was      seen here by Physical Therapy as the patient had no therapy in the      hospital.  They were concerned about the patient's ability to get      in and out of her house.  The patient has assured [place] that her      sister will be assisting her and has refused any placement for      further therapy or outpatient  therapy.  The patient is to wear her      PRAFO 24 hours a day and she is to dress her wounds with Silvadene      dressing changes daily.   FOLLOWUP:  In terms of follow up, the patient is to follow up with Dr.  Lajoyce Corners in the office in 2-1/2 weeks.  The patient is to follow up with  primary physician, Dr. Billee Cashing, in the office within a week and  she is to follow up with her hemodialysis on Monday, Wednesday and  Friday.   DIETARY RESTRICTIONS:  The patient should be  on a renal heart healthy  diabetic diet.   PHYSICAL RESTRICTIONS:  Activities as tolerated with the PRAFO.   Total time for the discharge process 46 minutes.      Altha Harm, MD  Electronically Signed     MAM/MEDQ  D:  03/11/2009  T:  03/11/2009  Job:  [id number]

## 2011-01-13 NOTE — Consult Note (Signed)
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  The patient has been seen in the Wound Clinic by Dr. Leanord Hawking.  The patient presents with a history of fever and chills despite p.o.  antibiotics.   PAST MEDICAL HISTORY:  Significant for:  1. Right heel infection.  2. Peripheral vascular access.  3. History of pneumonia.  4. History of anemia.  5. Type 2 diabetes.  6. End-stage renal disease, on dialysis.  7. Hypertension.  8. Secondary hyperparathyroidism.   For list of medications, please see the history and physical summary  sheet.   No known decrease drug allergies.   SOCIAL HISTORY:  The patient denied tobacco use and states she lives at  home with family.   FAMILY HISTORY:  Positive for diabetes.   REVIEW OF SYSTEMS:  Negative other than mentioned as above.   PHYSICAL EXAMINATION:  GENERAL:  The patient is alert, oriented.  There  is no adenopathy.  EXTREMITIES:  She has no ascending cellulitis.  In the left leg, she has  a black decubitus left heel ulcer with ischemic eschar.  The wound is  approximately 3 cm in diameter.  She does not have a palpable dorsalis  pedis and posterior tibial pulse.   Arteriogram studies showed a patent vessels through the ankle; however,  they were extremely small and tapered in the posterior tibial vessels.  The dorsalis pedis was open through the foot.  Review of the MRI scan  shows chronic osteomyelitis of the  calcaneus.   ASSESSMENT:  Chronic osteomyelitis with Wagner grade 3 ulcer, left  calcaneus.   PLAN:  I discussed with the patient the risk to life and limb with this  ulceration and infection, discussed that we could attempt foot salvage  surgery with partial calcaneal excision and wound closure.  I discussed  that the chance of  foot salvage would be less than 50:50 with the partial calcaneal  excision, also discussed the possibility of transtibial amputation.  The  patient states she would like to proceed with attempted foot salvage  surgery.  We will set this up and schedule for a foot salvage surgery.      Nadara Mustard, MD  Electronically Signed     MVD/MEDQ  D:  02/27/2009  T:  02/28/2009  Job:  [id number]

## 2011-01-13 NOTE — H&P (Signed)
[name], [name]               ACCOUNT NO.:  [id number]   MEDICAL RECORD NO.:  [id number]          PATIENT TYPE:  INP   LOCATION:  6732                         FACILITY:  [facility]   PHYSICIAN:  Cecille Aver, M.D.DATE OF BIRTH:  02-02-1971   DATE OF ADMISSION:  11/05/2008  DATE OF DISCHARGE:                              HISTORY & PHYSICAL   PRIMARY CARE PHYSICIAN:  Lorelle Formosa, MD   PRIMARY NEPHROLOGIST:  Dr. Darrick Penna.   CHIEF COMPLAINT:  Right groin bleeding, pain, purulent drainage.   HISTORY OF PRESENT ILLNESS:  The patient is a 40 year old female with  past medical history of end-stage renal disease on hemodialysis Monday,  Wednesday, and Friday at [facility], hypertension, diabetes mellitus  type 2, history of MRSA, AV graft infection, and bacteremia in November  2009, and history of positive PPD who presented to the emergency room  complaining of right groin pain.  She had a right thigh AV graft placed  on October 18, 2008 and states that she has had significant pain in  that area ever since.  She notes that it started draining spontaneously  earlier today a purulent material and she had cultures of it at dialysis  today and was given antibiotics.  When she left and went home, she notes  the area started bleeding and she could not get it to stop and so she  came to the emergency room.  She notes she was given two antibiotics  t[day at dialysis and cannot remember the names of either one.  She  denies fevers, chills, chest pain, shortness of breath, abdominal pain,  and other symptoms.   PAST MEDICAL HISTORY:  1. End-stage renal disease on hemodialysis Monday, Wednesday, Friday      on [employer] with multiple failed graft attempts in the past.      Currently has a temporary catheter in the right chest.  2. Type 2 diabetes mellitus.  3. Anemia of chronic disease.  4. Hypertension.  5. GERD.  6. History of MRSA, AV graft infection, and bacteremia in  November      2009.  7. History of positive PPD November 2009.  8. History of bilateral pneumonia January 2010.  9. Secondary hyperparathyroidism.   HOME MEDICATIONS:  1. INH 300 mg p.o. q. daily.  2. Pyridoxine 50 mg p.o. daily.  3. Aspirin 81 mg p.o. daily.  4. Nephro-Vite 1 tablet p.o. daily.  5. Labetalol 200 mg p.o. b.i.d.  6. Hydroxyzine 25 mg p.o. q.6 h. p.r.n.  7. PhosLo 2001 mg p.o. t.i.d.  8. Reglan 10 mg a.c. and at bedtime.  9. Humulin 70/30 36 units in the morning and 22 units in the evening.  10.Hectorol 1.5 mcg IV q. Mondays.  11.Norvasc, unknown dose.   SOCIAL HISTORY:  The patient is a 40 year old single female who lives in  [place] alone.  She has two children.  She does not smoke, drink  alcohol, or use illegal drugs.  She has a tenth grade education.   FAMILY HISTORY:  Her mother had diabetes mellitus and her siblings have  hypertension and  diabetes.   REVIEW OF SYSTEMS:  All negative except for right thigh pain, edema, and  purulent discharge.   PHYSICAL EXAMINATION:  VITAL SIGNS:  Temperature 99.5, blood pressure  121/76, heart rate 86, respiratory rate 22, O2 saturation 96% on room  air.  GENERAL:  She is alert and oriented x3, no distress.  HEENT:  PERRL, EOMI, sclerae clear, moist mucous membranes.  CARDIOVASCULAR:  Regular rate and rhythm with normal S1, S2, systolic  ejection murmur 1/6 best heart at the right sternal border, no rubs or  gallops.  LUNGS:  Clear to auscultation bilaterally with normal respiratory  effort.  ABDOMEN:  Good bowel sounds, soft, nontender, nondistended.  EXTREMITIES:  No clubbing, cyanosis, edema.  MUSCULOSKELETAL:  No joint deformity.  NEUROLOGIC:  She is alert and oriented x3, cranial nerves II through XII  are intact, no asterixis.  SKIN:  The patient has a large open wound of her right groin, but has  foul smelling with purulent and bloody material, good hemostasis has  been achieved, the area is tender to  palpation.  She also has a right  temporary catheter on her right chest without evidence of surrounding  infection.   LABORATORY STUDIES:  Sodium 134, potassium 3.2, chloride 96, bicarb 30,  BUN 12, creatinine 5.3, glucose 226.  White blood cell count 17.4,  absolute neutrophil count 13.7, hemoglobin 11.9, MCV 84, platelet count  315.  Chest x-ray, patchy right lung base opacity, favor atelectasis.   ASSESSMENT AND PLAN:  The patient is a 40 year old female with  1. Right groin arteriovenous graft infection.  Culture of the area are      pending, blood cultures are pending x2.  We will cover broadly for      now with vancomycin and Zosyn.  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  We will consult      Vascular Surgery for removal of the graft today.  We will start      morphine p.r.n. for pain and Tylenol p.r.n. for pain and fevers.      We will check coags in anticipation for surgery later on today.  2. End-stage renal disease on hemodialysis Monday, Wednesday, and      Friday.  The patient dialyzes at [facility] through a temporary      catheter on the right chest.  We will get records in the morning      when the office opens.  We will continue her medicines from      discharge last month for now.  She was dialyzed yesterday.  So, we      will plan on resuming dialysis on Wednesday.  Plan for graft      removal later on today.  3. Diabetes mellitus type 2, on insulin as an outpatient.  We will      follow up hemoglobin A1c to assess control and we will place on      half of her home insulin dose especially given that she is going to      be n.p.o. overnight and then we will titrate up her insulin as      needed.  We will also place on sliding scale.  4. Hypertension.  We will continue  labetalol for now.  We will get  records before adding agents since she is normotensive and she      cannot recall the dose of Norvasc she is on.  5. Hypokalemia likely secondary from dialysis earlier today.  No need      to replete this time.  6. Anemia of chronic disease, hemoglobin good at this point.  7. Secondary hyperparathyroidism, continue PhosLo for now.  We will      get records to see if she is on any additional medications.      Joaquin Courts, MD  Electronically Signed     ______________________________  Cecille Aver, M.D.    VW/MEDQ  D:  11/06/2008  T:  11/06/2008  Job:  [id number]

## 2011-01-13 NOTE — H&P (Signed)
NAME:  [name], [name] NO.:  [id number]   MEDICAL RECORD NO.:  [id number]          PATIENT TYPE:  INP   LOCATION:  1828                         FACILITY:  [facility]   PHYSICIAN:  Vania Rea, M.D. DATE OF BIRTH:  11-Oct-1970   DATE OF ADMISSION:  02/23/2009  DATE OF DISCHARGE:                              HISTORY & PHYSICAL   PRIMARY CARE PHYSICIAN:  Dr. Lorelle Formosa.   WOUNDS PHYSICIAN:  Dr. Baltazar Najjar.   NEPHROLOGIST:  Dr. Darrick Penna.   CHIEF COMPLAINT:  Fever, chills and pain in the right foot.   HISTORY OF PRESENT ILLNESS:  This is a 40 year old African American lady  with a history of diabetes type 2, end-stage renal disease dialysis  dependent with recurrent infections and chronic infection of both feet,  who is being seen in Wound Clinic by Dr. Leanord Hawking.  She was seen 2 days  ago and was continued on doxycycline for MRSA positive wounds, but  because of the appearance of the wounds an MRI of the feet has been  scheduled for this coming Monday.  However, the patient presented to the  emergency room today complaining of fever, chills and pain in the feet.  She was evaluated and found to have a high fever, felt to be septic and  the hospitalist service called to assist with management.  Because of  the pain, the patient has been given morphine in the emergency room and  is now too drowsy to cooperate fully with the history and physical.  Much of the history is gleaned from a review of her old records.  She  denies cough, cold or chest pain.   PAST MEDICAL HISTORY:  1. Infection of the sole of her right foot and the left heel.  2. Recent infected right groin AV graft, culture positive for MRSA and      E-coli  3. Aspiration pneumonitis.  4. Vaginal Trichomonas.  5. Acute blood loss anemia.  6. Anemia of chronic disease.  7. Diabetes type 2.  8. End-stage renal disease on dialysis Monday, Wednesday and Friday.  9. Hypertension.  10.Secondary  hyperparathyroidism.  11.History of PPD positivity in January 2009.  12.GERD.  13.History of bilateral pneumonia in January 2010.   MEDICATIONS:  The patient does not have her full medication list with  her and only the names of the medications have been copied down by the  nursing staff.  The copied list includes doxycycline, Reglan, Rena-Vite,  pyridoxine, amlodipine, aspirin, Coumadin, hydroxyzine, isoniazid,  Lomotil, Mucinex, Septra, labetalol and 70/30 insulin.  A note is made  that she is not taking Coumadin nor Mucinex nor Septra   ALLERGIES:  NO KNOWN DRUG ALLERGIES.   SOCIAL HISTORY:  She denies tobacco, alcohol or illicit drug use.   FAMILY HISTORY:  Her mother is diabetic and she has siblings with  hypertension and diabetes.   REVIEW OF SYSTEMS:  Other than noted above was negative.   PHYSICAL EXAMINATION:  GENERAL:  A drowsy young African American lady  lying on the stretcher, does not appear acutely distressed.  VITAL SIGNS:  Her  temperature is 101.3, pulse initially 102, now 83,  respirations 20, blood pressure initially 95/59, now 101/70.  She is  saturating at 98% on 2 liters.  HEENT:  Her pupils are round and equal.  Mucous membranes pink and  anicteric.  She is mildly dehydrated.  She appears to have oral Candida.  NECK:  She has a bilateral parotid hypertrophy.  No jugulovenous  distention.  No carotid bruit.  She has a dirty looking subclavian  dialysis access, but there is no purulent drainage.  CHEST:  Clear to auscultation bilaterally.  CARDIOVASCULAR:  Regular rhythm.  ABDOMEN:  Obese and soft.  There is no tenderness.  No masses felt.  EXTREMITIES:  She has both feet bandaged.  Documentation from the Wound  Clinic describes of ulceration of the soles of her feet and her heel.  Examination of her groin shows she has a 2 cm stage II-III ulcer in the  right groin.  It is clean, however, it is a right in her panty line and  there is purulent discharge  soaking her panties.  CENTRAL NERVOUS SYSTEM:  The patient is drowsy.  There does not appear  to be any facial asymmetry.  She moves all limbs equally and there is no  obvious focal lateralizing signs.   LABORATORY DATA:  Her white count is 32,000, hemoglobin 11.4, platelets  339.  Her absolute neutrophil count is 26.4.  Her sodium is 130,  potassium 4.4, chloride 95, CO2 of 26, glucose 326, BUN is 11,  creatinine 4.5, calcium is 9.1.  Erythrocyte sedimentation rate is 81.  X-rays of her chest reveals no acute disease.  X-ray of her left foot  shows gas within the soft tissues of the heel, but this may be related  to the packing.   ASSESSMENT:  1. Sepsis.  2. Bilateral diabetic foot infection.  3. Diabetes type 2, uncontrolled.  4. End-stage renal disease, dialysis dependent.   PLAN:  1. We will admit this lady for intravenous therapy.  We will give      medications to cover both E-coli and MRSA      pending blood cultures.  2. We will consult the Wound Service and we will consult nephrologist      to assist with management.  Other plans as per orders.      Vania Rea, M.D.  Electronically Signed     LC/MEDQ  D:  02/23/2009  T:  02/23/2009  Job:  [id number]   cc:   Darrick Penna, MD  Lorelle Formosa, M.D.  Maxwell Caul, M.D.  Carmie Kanner, MD

## 2011-01-13 NOTE — Assessment & Plan Note (Signed)
OFFICE VISIT   [name], [name]  DOB:  27-May-1971                                       01/07/2010  ZOXWR#:[id number]   The patient returns today for further evaluation of vascular access.  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 Lajoyce Corners.  The amputation has now healed and the patient has returned for further  evaluation for access.  She is not a candidate for any access in the  upper extremities and currently has a left IJ Diatek catheter.  She has  had an infected graft removed from her right thigh and her only site  remaining is the left thigh.   CHRONIC MEDICAL PROBLEMS:  1. Hypertension.  2. Diabetes mellitus insulin dependent.  3. Chronic renal insufficiency with hemodialysis on Monday, Wednesday      and Friday.   REVIEW OF SYSTEMS:  The patient denies any chest pain, dyspnea on  exertion.  No wheezing, chronic bronchitis, recent infection.   PHYSICAL EXAM:  Vital signs:  Blood pressure is 129/95, heart rate 90,  temperature 97.4.  General:  She is alert and oriented times 3.  She is  a well-developed female who is in no apparent distress.  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  Chest:  Clear to  auscultation.  Cardiovascular:  Regular rhythm.  No murmurs.  Abdomen:  Soft, nontender with no masses.  Left stump exam reveals a 3+ femoral  pulse with a well-healed amputation stump.  Right leg is unremarkable.   PLAN:  Is to admit the patient on May 17 for left thigh AV graft by Dr.  Darrick Penna.  Risks and benefits have been thoroughly discussed and the  patient would like to proceed.     [name], M.D.  Electronically Signed   JDL/MEDQ  D:  01/07/2010  T:  01/08/2010  Job:  [id number]

## 2011-01-13 NOTE — Group Therapy Note (Signed)
[name], [name]               ACCOUNT NO.:  [id number]   MEDICAL RECORD NO.:  [id number]          PATIENT TYPE:  INP   LOCATION:  6714                         FACILITY:  [facility]   PHYSICIAN:  Mary I Elsaid, MD      DATE OF BIRTH:  [birth year]                                 PROGRESS NOTE   DIAGNOSES:  1. Early sepsis.  2. Left osteomyelitis/cellulitis and large grade 3 heel ulcer of the      left foot.  3. End-stage renal disease on hemodialysis.  4. Anemia of chronic disease.  5. Type 2 diabetes mellitus.  6. Hypertension.  7. Secondary hyperparathyroidism.  8. 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  9. History of right groin arteriovenous graft infection with MRSA and      E. coli.  10.Diarrhea, resolved.  11.Persistent leukocytosis.   MEDICATIONS:  To be dictated at the date of actual discharge.   PROCEDURES:  1. X-ray of the left foot.  Gauze within the soft tissue of the field.      No radiographic change of osteomyelitis.  2. Chest x-ray:  No active disease.  3. MRI:  No MRI findings with resulting from myelitis involving the      right foot.  Mild cellulitis and diffuse myositis.  No finding for      septic arthritis or soft tissue abscess.  4. MRI of the left large heel ulcer with underlying cellulitis,      myofasciitis and osteomyelitis.  5. Angiogram of the left foot.  No significant disease by angiogram      iliac artery, similar artery, widely patent mild at 20-25% stenosis      proximal/mid superficial femoral artery.  Profunda widely patent.      Popliteal artery widely patent, three-vessel runoff. Focal 40-50%      stenosis of mid AT.   CONSULTATIONS:  1. Vascular Surgery.  2. Orthopedics, Mary Mustard, MD.  3. Nephrology, Dr. Darrick Wang.   HISTORY OF PRESENT ILLNESS:  This is a 40 year old African American female history of diabetes  mellitus type 2, end-stage renal disease on hemodialysis Monday,  Wednesday and Friday presented  with history of recurrent infection and  chronic infection of both feet who was sent by Wound Care by Dr. Pollyann Kennedy  for the appearance of her lower extremity wound and pain especially in  her left foot.  MRI was ordered but the patient came to the emergency  room complaining of fever, chills and pain on the feet.  The patient in  the emergency room found to have a temperature of 101.3, pulse of 102,  blood pressure of 95/59 and patient admitted to step-down unit for close  observation.  Source of sepsis most probably secondary to infection of  both feet and MRI was ordered.   1. Osteomyelitis/cellulitis and myofasciitis of the left foot.      Patient is started on broad-spectrum antibiotic mainly vancomycin      and cefepime.  Patient kept under close observation.  Did not      require any form  of pressor and patient was transferred to 6700.      MRI results as above.  Vascular and Orthopedic Surgery consulted.      Vascular done.  Angiogram did not show any serious peripheral      vascular disease and Vascular Surgery consulted Dr. Lajoyce Wang to option      patient have partial calcaneum amputation versus below knee      amputation.  The patient wishes to proceed with attempted foot      salvage with chance of success.  Plan for partial calcaneum      excision on Monday.  2. Leukocytosis with white blood cells continue to be elevated and      patient's history complicated by diarrhea which we felt could be      secondary to C. diff versus antibiotic induced diarrhea.  Patient      on vancomycin.  C. diff toxin is pending today.  Patient's diarrhea      is improved.  We will continue to monitor for C. diff.  Patient      placed empirically on Flagyl p.o.  Still has persistent      leukocytosis apparently was  improved by treating different source      which is foot osteomyelitis and cellulitis.  3. End-stage renal disease, hemodialysis.  Continue as per Nephrology      Monday, Wednesday and  Friday.  4. Anemia of chronic disease.  Patient remains on Aranesp.  5. Hypertension.  Blood pressure remains fluctuating and withholding      Norvasc and labetalol.  6. Diabetes mellitus.  Blood sugar dropped during hospital stay and we      will continue monitor with sliding scale.  7. History of PPD positive and patient remains on INH.      Mary Bosie Helper, MD  Electronically Signed     HIE/MEDQ  D:  02/28/2009  T:  02/28/2009  Job:  [id number]

## 2011-01-13 NOTE — Discharge Summary (Signed)
NAMELESLEA, VOWLES               ACCOUNT NO.:  [id number]   MEDICAL RECORD NO.:  [id number]          PATIENT TYPE:  INP   LOCATION:  6703                         FACILITY:  [facility]   PHYSICIAN:  Elliot Cousin, M.D.    DATE OF BIRTH:  1971/05/30   DATE OF ADMISSION:  07/08/2008  DATE OF DISCHARGE:  07/12/2008                               DISCHARGE SUMMARY   DISCHARGE DIAGNOSES:  1. Infected left upper extremity arteriovenous graft secondary to      methicillin-resistant Staphylococcus aureus.  2. Methicillin-resistant Staphylococcus aureus bacteremia.  3. Status post removal of the arteriovenous graft on July 08, 2008.      Placement of right internal jugular hemodialysis catheter on      July 10, 2008, by Dr. Liliane Bade.  4. End-stage renal disease.  5. Bibasilar pneumonia.  6. Hypertension.  7. Type 2 diabetes mellitus.  8. Anemia, secondary to end-stage renal disease.   DISCHARGE MEDICATIONS:  1. Ceftin 500 mg b.i.d. for five more days.  2. Aspirin 81 mg daily.  3. Vancomycin with dialysis per the Nephrology Team.  4. Nephro-Vite 1 tablet daily.  5. Humulin 70/30, 36 units subcu each morning and 22 units subcu each      evening (do not take insulin if your capillary blood glucose is      less than 120 and take half if you are not eating well).  6. Labetalol 400 mg b.i.d.  7. Hydroxyzine 25 mg every 6 hours.  8. PhosLo 2001 mg three times daily.  9. Reglan 10 mg q.a.c. and q.h.s.   Hemodialysis as previously scheduled on Monday, Wednesdays, and Fridays.   DISCHARGE DISPOSITION:  The patient is being discharged to home in  improved and stable condition.  The plan is for her to follow up with  Dr. Ronne Binning in 1 week,  the nephrologist at the dialysis center on  Monday, Wednesday, and Friday and with Vascular Surgeon Dr. Madilyn Fireman as  needed.   CONSULTATIONS:  1. P. Liliane Bade, MD  2. [facility].   PROCEDURES PERFORMED:  1. Removal of infected left  upper extremity arteriovenous graft by Dr.      Madilyn Fireman on July 08, 2008.  2. Ultrasound-guided right internal jugular Diatek catheter by Dr.      Madilyn Fireman on July 10, 2008.  3. Chest x-ray on July 12, 2008.  The results revealed      cardiomegaly again noted.  Bibasilar patchy atelectasis or      infiltrate.   HISTORY OF PRESENT ILLNESS:  The patient is a 40 year old woman with a  past medical history significant for end-stage renal disease,  hypertension, and diabetes mellitus.  She presented to the emergency  department on July 08, 2008, with a chief complaint of fever, chills,  nausea, vomiting, and swelling and pain of her left upper arm at the  site of the AV graft.  When she was evaluated in the emergency  department, she was febrile with a temperature of 101.9.  Her white  blood cell count was 25.1.  The patient was admitted for further  evaluation and management.   For additional details, please see the dictated history and physical.   HOSPITAL COURSE:  1. MRSA BACTEREMIA AND MRSA INFECTION OF THE LEFT UPPER EXTREMITY      INFECTED AV GRAFT.  Blood cultures were ordered in the emergency      department.  The patient was subsequently started on empiric      antibiotic treatment with vancomycin and Zosyn.  [facility] and Vascular Surgeon Dr. Madilyn Fireman were consulted.      Following the evaluation by Dr. Madilyn Fireman, it was clear that the AV      graft was infected and needed to be removed.  Dr. Madilyn Fireman removed the      AV graft on July 08, 2008.  Specimens were sent for culturing.      Eventually, the AV graft culture grew out MRSA.  Shortly      thereafter, the patient's blood cultures became positive for MRSA      as well.  She was maintained on vancomycin and Zosyn.  Because of      the need for hemodialysis, Dr. Madilyn Fireman inserted a right internal      jugular Diatek catheter on July 10, 2008.  Dr. Briant Cedar and      colleagues managed the patient's  hemodialysis.  They made      adjustments in some of her chronic medications.  Over the course of      the hospitalization, the patient became afebrile.  Her white blood      cell count improved to 12.5 prior to hospital discharge.  As of      today, the patient has completed 5 days of vancomycin therapy.  She      will continue to be treated with vancomycin for the next several      weeks with hemodialysis as directed by the nephrology team.  Zosyn      was discontinued 2 days ago.  2. BIBASILAR PNEUMONIA.  The patient's chest x-ray revealed bibasilar      infiltrates consistent with either atelectasis or pneumonia.  She      did have a complaint of a productive cough with yellow sputum.  As      indicated above, she was treated with Zosyn for several days      empirically.  She will be discharged to home on ongoing treatment      with Ceftin 500 mg b.i.d. for five more days.  3. HYPERTENSION.  The patient was hypertensive during the      hospitalization.  Initially, labetalol was withheld.  Upon      discharge, the patient was advised to restart labetalol as      previously prescribed.  4. TYPE 2 DIABETES MELLITUS.  The patient's capillary blood glucose      was well controlled during the hospitalization.  Her hemoglobin A1c      was 11.1 indicating poor outpatient control.  The patient also a      recent history of hypoglycemia.  She was instructed on avoiding      hypoglycemia by decreasing the dose of her 70/30 insulin when she      is not eating well and/or if her capillary blood glucose is      consistently below 120.  5. ANEMIA SECONDARY TO END-STAGE RENAL DISEASE.  The patient's      hemoglobin ranged from 11.2-9.1 during the hospitalization.  She  was maintained on Aranesp.  In part, the decrease in her hemoglobin      may have been secondary to blood loss from the operation.      Elliot Cousin, M.D.  Electronically Signed     DF/MEDQ  D:  07/12/2008  T:   07/12/2008  Job:  [id number]   cc:   Lorelle Formosa, M.D.  [facility]  P. Liliane Bade, M.D.

## 2011-01-16 NOTE — Discharge Summary (Signed)
[name], [name]               ACCOUNT NO.:  [id number]   MEDICAL RECORD NO.:  [id number]          PATIENT TYPE:  INP   LOCATION:  4738                         FACILITY:  [facility]   PHYSICIAN:  Gertha Calkin, M.D.DATE OF BIRTH:  24-Jan-1971   DATE OF ADMISSION:  09/07/2004  DATE OF DISCHARGE:  09/12/2004                                 DISCHARGE SUMMARY   PRIMARY CARE PHYSICIAN:  Dr. Lysbeth Galas.   PRIMARY CARE:  Renal is [facility].   DISCHARGE DIAGNOSES:  1.  Pyelonephritis.  2.  End-stage renal disease.  3.  Chronic anemia.  4.  Hypertension.  5.  Diabetes, insulin-dependent.  6.  Secondary hyperparathyroidism.   DISCHARGE MEDICATIONS:  1.  Ciprofloxacin 400 mg p.o. daily to finish a 10-day course.  2.  Labetalol 400 mg p.o. b.i.d.  3.  Aspirin 81 mg p.o. daily.  4.  Reglan 10 mg p.o. q.a.c. h.s.  5.  Protonix 40 mg p.o. b.i.d.  6.  Hectoral 1 mcg IV with dialysis.  7.  Calcium carbonate 500 mg p.o. t.i.d. with meals.  8.  Phenergan 25 mg IV/p.o. q.6h. p.r.n.  9.  Epogen 10,000 units IV with dialysis.  10. LANTUS 5 UNITS SUBCUTANEOUS Q.H.S.   DIAGNOSTIC STUDIES:  Acute abdominal x-ray done 09/07/04.   IMPRESSION:  Opacity medially at the left lung base, atelectasis or  pneumonia.  No obstruction or free air.  A small calcified uterine fibroid.  Chest x-ray done on 09/07/04 shows a small amount of patchy atelectasis only  in the posterior left lower lobe, borderline cardiomegaly.  On 09/08/04, x-ray  shows mild cardiomegaly only.  __________ placed under fluoroscopy by Dr.  Hart Rochester.  On 09/08/04, portable chest after placement showed cardiomegaly,  pulmonary vascular congestion, and probable minimal interstitial edema.  The  right catheter placed overlying the mid-SVC to arterial junction, no  pneumothorax.   DISCHARGE LABORATORY DATA:  White count 12.5, this is down from previous  CBC.  Hemoglobin 10.1, hematocrit 29.9, MCV 77, platelets 392,000.  It was  on 09/11/04.   BMET on 09/11/04 showed a sodium of 138, potassium 2.6, chloride  105, bicarb 25, glucose 65, BUN 26, creatinine 7.1, calcium 6.9.   HOSPITAL COURSE:  Please see H&P that was dictated on initial admission.  Problem 1. Pyelonephritis.  Started on IV Cipro.  He has been afebrile on  antibiotics.  Leukocytosis is resolving slowly.  Needs to finish a 10-day  course of oral Cipro for a total 10-day course.  I have discussed with Dr.  Lysbeth Galas and he is made aware.   Problem 2. End-stage renal disease.  [facility] is following.  Will be  arranging outpatient dialysis for tomorrow.  They are also following her  anemia, as well as her electrolyte abnormalities.   Problem 3. Diabetes.  During the hospitalization due to decreased p.o.  intake, needed to decrease her baseline Lantus from 10 units to 5 units.  On  5 units, her sugars are no longer below 70.  She is slowly starting to  tolerate diet but this is again slow.  At this  point, no oral hypoglycemics  were needed during the hospitalization.   Problem 4. Anemia.  She is receiving Epogen.   Problem 5. Hypertension.  Controlled on labetalol.  No changes during this  hospitalization.   Problem 6. Secondary hyperparathyroidism.  Being treated with calcium  supplements and PhosLo.   FOLLOWUP:  Follow up with Dr. Joyce Copa office.  His PAs have been informed  and will arrange this, as well as making sure all other medications as  listed are correct in comparison with their office records.  She has already  been arranged to have dialysis to continue with her Tuesday, Thursday,  Saturday schedule.  This is to resume on discharge with dialysis being  already scheduled for tomorrow morning.       JD/MEDQ  D:  09/12/2004  T:  09/12/2004  Job:  [id number]   cc:   Delaney Meigs, M.D.  [address]  [place]  [place] [address]  Fax: [phone]   [facility]

## 2011-01-16 NOTE — Op Note (Signed)
NAME:  [name], [name]               ACCOUNT NO.:  [id number]   MEDICAL RECORD NO.:  [id number]          PATIENT TYPE:  OIB   LOCATION:  5501                         FACILITY:  [facility]   PHYSICIAN:  Janetta Hora. Fields, MD  DATE OF BIRTH:  05-29-1971   DATE OF PROCEDURE:  07/14/2005  DATE OF DISCHARGE:  07/10/2005                                 OPERATIVE REPORT   PROCEDURE:  1.  Removal of infected right forearm AV graft.  2.  Patch angioplasty of right brachial artery.   PREOPERATIVE DIAGNOSIS:  Infected right forearm AV graft.   POSTOPERATIVE DIAGNOSIS:  Infected right forearm AV graft.   ANESTHESIA:  General.   ASSISTANT:  Stephanie Acre Dominick, PA   OPERATIVE FINDINGS:  1.  Infected venous limb of AV graft.  2.  Thrombosed AV graft.  3.  Patch angioplasty of right brachial artery using bovine pericardium.   OPERATIVE DETAIL:  Obtaining informed consent, the patient taken to the  operating room. The patient placed supine position on the operating room  table. After induction of general anesthesia the patient's right upper  extremity prepped and draped usual sterile fashion. Longitudinal incision  was made through a preexisting scar just above the elbow on the right arm.  Incision was carried down through subcutaneous tissues down to the level of  the graft. Graft was poorly incorporated at this level. The venous  anastomosis was dissected free circumferentially. The distal vein was  ligated with a silk tie. The graft was then transected and removed from the  venous limb. On pulling on the venous limb of this portion, there was  purulent drainage coming more proximal. Next the preexisting antecubital  scar was reopened with a transverse incision. Incision was carried down  through subcutaneous tissues down to level of venous limb of the graft.  There was a large amount of purulent material in this location. Dissection  then proceeded down to level the arterial limb of graft. The  dissection was  carried down to the level of the arterial anastomosis and the brachial  artery proximal to and distal to the anastomosis was dissected free  circumferentially and controlled with vessel loops. The patient was then  given 5000 units of intravenous heparin. Arterial anastomosis was taken  down. The artery was debrided at its edges. A bovine pericardial patch was  then brought up into the operative field and sewn on as a patch angioplasty  using a running 6-0 Prolene suture.  Just prior to completion anastomosis  this was fore bled, back bled and thoroughly flushed. Anastomosis was  secured, vessel loops were released. There is palpable brachial pulse  immediately. There was audible radial and ulnar Doppler signal. Next the  remainder of the graft was removed from the venous limb first this came out  easily as it was not well incorporated and there was a large amount of  purulent material in this location. Incision was placed over the largest  concentration of purulent material for adequate drainage. This was  thoroughly irrigated with normal saline solution. The arterial limb of the  graft was  more incorporated. An additional counter incision was placed over  the arterial limb of graft in order to remove this. This was also thoroughly  irrigated with normal saline solution. A Penrose drain was then placed  throughout the course of the tunneled portion of the graft and this was  brought out through a separate stab incision on the side of the arm. The  remain incisions were thoroughly irrigated with saline solution. The  subcutaneous layers were then closed with running 3-0 Vicryl suture. And all  but the large abscess pocket were closed with staples. This was left open  and packed with normal saline moistened gauze. The patient tolerated  procedure well and there were no complications.  Instrument, sponge, and  needle counts end of the case. The patient taken to the recovery  room in  stable condition.           ______________________________  Janetta Hora Fields, MD     CEF/MEDQ  D:  07/14/2005  T:  07/15/2005  Job:  [id number]

## 2011-01-16 NOTE — Op Note (Signed)
NAMEJONA, ERKKILA               ACCOUNT NO.:  [id number]   MEDICAL RECORD NO.:  [id number]          PATIENT TYPE:  OIB   LOCATION:  2899                         FACILITY:  [facility]   PHYSICIAN:  Di Kindle. Edilia Bo, M.D.DATE OF BIRTH:  [birth year]   DATE OF PROCEDURE:  09/19/2004  DATE OF DISCHARGE:  09/19/2004                                 OPERATIVE REPORT   PREOPERATIVE DIAGNOSIS:  Chronic renal failure.   POSTOPERATIVE DIAGNOSES:  Chronic renal failure.   PROCEDURE:  Placement of new right IJ Diatek catheter (28 cm).   SURGEON:  Edilia Bo.   ANESTHESIA:  Local with sedation.   TECHNIQUE:  The patient was taken to the operating room, sedated by  anesthesia. I ultrasounded the left internal jugular vein, and this was  patent. The patient had a right-sided catheter was not functioning. The neck  and upper chest were prepped and draped in usual sterile fashion. After the  skin was anesthetized with 1% lidocaine, the exit site for the old catheter  was anesthetized as was the entrance site to the IJ. A transverse incision  was made in the end the neck, and the old catheter was dissected out and  clamped and then divided distally. It was then removed easily. The patient  was placed in Trendelenburg. The wire was advanced to the remaining portion  of the catheter down into the right atrium. This was then removed, and then  the dilator and peel-away sheath were advanced over the wire. The wire and  dilator were removed, and the catheter advanced through the peel-away  sheath. The catheter was positioned in the right atrium. The exit site to  the catheter was selected and the skin anesthetized between the two areas.  Catheter was then brought to the tunnel, cut to the appropriate length and  the distal ports were attached. Both ports withdrew easily. We then flushed  with heparinized saline and filled with concentrated heparin. The catheter  was secured at its exit site with a 3-0  nylon suture. The IJ cannulation  site was closed with 4-0 subcuticular stitch. Sterile dressing was applied.  The patient tolerated procedure well and was transferred to recovery room in  satisfactory condition. All needle and sponge counts were correct.      CSD/MEDQ  D:  09/19/2004  T:  09/20/2004  Job:  [id number]

## 2011-01-16 NOTE — Op Note (Signed)
[name], [name]               ACCOUNT NO.:  [id number]   MEDICAL RECORD NO.:  [id number]          PATIENT TYPE:  INP   LOCATION:  4738                         FACILITY:  [facility]   PHYSICIAN:  Larina Earthly, M.D.    DATE OF BIRTH:  [birth year]   DATE OF PROCEDURE:  09/10/2004  DATE OF DISCHARGE:                                 OPERATIVE REPORT   PREOPERATIVE DIAGNOSIS:  End-stage renal disease.   POSTOPERATIVE DIAGNOSIS:  End-stage renal disease.   PROCEDURE:  Placement of right forearm loop AV Gore-Tex graft.   SURGEON:  Dr. Tawanna Cooler Early.   ASSISTANT:  Jerold Coombe, P.A.-C.   ANESTHESIA:  MAC.   COMPLICATIONS:  None.   DISPOSITION:  To the recovery room, stable.   PROCEDURE IN DETAIL:  The patient was taken to the operating room and placed  in the supine position, the right arm prepped and draped in a sterile  fashion.  Using local anesthesia, an incision was made over the antecubital  space, carried down to isolate the cephalic vein.  The cephalic vein was  chronically occluded.  The vein was opened and a 3 Fogarty catheter was used  to attempt to thrombectomize this but the vein was sclerotic and unusable  for fistula.  The brachial artery and brachial vein were exposed through the  same incision.  The artery and vein were of good caliber.  The vein had  multiple branches.  A separate incision was made over the distal forearm and  a loop configuration tunnel was created and a 6 mm standard ball stretch  graft was brought through the tunnel.  The brachial vein was occluded  proximally and distally and was opened with an 11 blade set of Pott's  scissors.  The graft was spatulated and sewn end-to-side to the vein with a  running 6-0 Prolene suture.  The clamps were removed from the vein.  The  graft was flushed with heparinized saline and re-occluded.  Next, the  brachial artery was occluded with a fistula clamp proximally and distally  and opened with an 11 blade and a  small arteriotomy was created.  The graft  was cut to the appropriate length, was sewn end-to-side to the artery with a  running 6-0 Prolene suture.  The clamps were removed and an excellent thrill  was noted.  The wounds were irrigated with saline.  Hemostasis was obtained  with electrocautery.  The wounds were closed with 3-0 Vicryl in the  subcutaneous tissue and subcuticular tissue and Benzoin and Steri-Strips  were applied.                                               ______________________________  Larina Earthly, M.D.  Electronically Signed 09/22/2004 01:56:46pm EST    TFE/MEDQ  D:  09/10/2004  T:  09/10/2004  Job:  [id number]

## 2011-01-16 NOTE — Op Note (Signed)
NAMEDEARIA, [name]               ACCOUNT NO.:  [id number]   MEDICAL RECORD NO.:  [id number]          PATIENT TYPE:  INP   LOCATION:  4738                         FACILITY:  [facility]   PHYSICIAN:  [name]. Hart Rochester, M.D.  DATE OF BIRTH:  1971-06-26   DATE OF PROCEDURE:  09/08/2004  DATE OF DISCHARGE:                                 OPERATIVE REPORT   PREOPERATIVE DIAGNOSIS:  End-stage renal disease.   POSTOPERATIVE DIAGNOSIS:  End-stage renal disease.   OPERATION:  1.  Bilateral ultrasound localization internal jugular veins.  2. Insertion      of Diatek catheter, right internal jugular vein (24 cm).   SURGEON:  [name]. Hart Rochester, M.D.   FIRST ASSISTANT:  Nurse.   ANESTHESIA:  Local.   DESCRIPTION OF PROCEDURE:  The patient was taken to the operating room and  placed in the Trendelenburg position.  Upper chest and neck were exposed and  both internal jugular veins were imaged using D-mode ultrasound and both  noted to be widely patent and normal in appearance.  After prepping and  draping in routine sterile manner, the right internal jugular vein was  entered using a supraclavicular approach.  Guide wire was passed into the  right atrium under fluoroscopic guidance.  After dilating the tract  appropriately, a 24 cm Diatek catheter was passed through a peel-away sheath  and positioned in the right atrium, sheath removed, and the catheter  tunneled peripherally, and secured with nylon sutures.  The wound was closed  with Vicryl in a subcuticular fashion, and sterile dressing applied.  The  patient was taken to the recovery room in satisfactory condition.       JDL/MEDQ  D:  09/08/2004  T:  09/08/2004  Job:  [id number]

## 2011-01-16 NOTE — Op Note (Signed)
NAME:  [name], [name]               ACCOUNT NO.:  [id number]   MEDICAL RECORD NO.:  [id number]          PATIENT TYPE:  AMB   LOCATION:  SDS                          FACILITY:  [facility]   PHYSICIAN:  Di Kindle. Edilia Bo, M.D.DATE OF BIRTH:  December 02, 1970   DATE OF PROCEDURE:  06/19/2005  DATE OF DISCHARGE:  06/19/2005                                 OPERATIVE REPORT   PREOPERATIVE DIAGNOSIS:  Chronic renal failure.   POSTOPERATIVE DIAGNOSIS:  Chronic renal failure.   PROCEDURE:  Ultrasound-guided placement of a right internal jugular Diatek  catheter.   SURGEON:  Di Kindle. Edilia Bo, M.D.   ASSISTANT:  Nurse.   ANESTHESIA:  Local with sedation.   TECHNIQUE:  The patient was taken to the operating room and sedated by  Anesthesia.  The neck and upper chest were prepped and draped in the usual  sterile fashion.  The ultrasound scanner had been used to mark both internal  jugular veins, both of which appeared to be patent.  After the skin was  anesthetized with 1% lidocaine with the patient in Trendelenburg, the right  IJ was cannulated and a guidewire introduced into the superior vena cava  under fluoroscopic control.  The tract over the wire was dilated and then a  dilator and peel-away sheath were passed over the wire and the wire and  dilator removed, and the catheter was passed through the peel-away sheath  and positioned in the right atrium.  The exit site for the catheter was  selected and the skin anesthetized between the 2 areas.  The catheter was  then brought through the tunnel, cut to the appropriate length and the  distal ports were attached.  Both ports withdrew easily.  We then flushed  with heparinized saline and filled with concentrated heparin.  The catheter  was secured at its exit site with a 3-0 nylon suture.  The IJ cannulation  site was closed with 4-0 subcuticular stitch.  A sterile dressings was  applied.  The patient tolerated the procedure well and was  transferred to  the recovery room in satisfactory condition.  All needle and sponge counts  were correct.      Di Kindle. Edilia Bo, M.D.  Electronically Signed     CSD/MEDQ  D:  06/19/2005  T:  06/20/2005  Job:  [id number]

## 2011-01-16 NOTE — H&P (Signed)
[name], [name]               ACCOUNT NO.:  [id number]   MEDICAL RECORD NO.:  [id number]          PATIENT TYPE:  INP   LOCATION:  1823                         FACILITY:  [facility]   PHYSICIAN:  Danae Chen, M.D.DATE OF BIRTH:  [birth year]   DATE OF ADMISSION:  09/07/2004  DATE OF DISCHARGE:                                HISTORY & PHYSICAL   CHIEF COMPLAINT:  Nausea, vomiting, and abdominal pain.   HISTORY OF PRESENT ILLNESS:  This patient is a 40 year old black female,  well-known to our service, who was recently seen in the emergency department  both at [facility] and [facility] yesterday and individually diagnosed with  urinary tract infection at both facilities.  She was placed on oral  antibiotics.  However, the patient returns today with complaints of  increasing nausea and vomiting, which is intractable to her oral  medications, and she is unable to keep her oral meds down.  Of note, the  patient was also admitted in November of this past year for similar  complaints but at that time was found to have progressive chronic kidney  disease to end-stage renal failure and was evaluated by the renal team and  prepared for eventually dialysis.  She has uncontrolled diabetes as well as  uncontrolled hypertension, and she reports that she has been only  intermittently compliant with her medications.  She has not followed up with  either renal or the CVTS service since her discharge.  She had a left  forearm AV graft placed on November 19 per Dr. Hart Rochester in preparation for  dialysis, but at this time, that graft does not appear to be functioning.  We have informed the renal team, who will come and see her today, as well as  the CVTS team will see her today.  The patient reports some chest discomfort  as well.  She has not been eating as well over the past couple of days she  reports.   PAST MEDICAL HISTORY:  As noted, significant for:  1.  Longstanding diabetes, now  insulin-dependent.  2.  Hypertension.  3.  Anemia of chronic disease.  4.  Secondary hyperparathyroidism.  5.  Medical noncompliance.   PAST SURGICAL HISTORY:  1.  Exploration and closure of laceration of the left hand in early November      2005.  2.  AV graft placement on July 19, 2004, per Dr. Hart Rochester.  3.  Cimino graft in left forearm.   ALLERGIES:  No known drug allergies.   MEDICATIONS:  1.  Lantus 10 units nightly.  2.  Prilosec 40 mg daily.  3.  Furosemide unknown dosage.  4.  Labetalol unknown dosage.  5.  Iron tablets unknown dosage.   SOCIAL HISTORY:  The patient denies alcohol or tobacco use.  She is single.  She has two children.  She has applied for disability.   FAMILY HISTORY:  Significant for diabetes with end-stage renal failure in  her mother and hypertension on her father's side.   REVIEW OF SYSTEMS:  The patient denies any blurred vision or headaches.  She  has had nausea, vomiting, chest pain, and shortness of breath.  No melena or  hematochezia.  No recent weight gain or weight loss that she is aware of.   PHYSICAL EXAMINATION:  GENERAL:  She is in no acute distress, slumped over  in her chair by the bedside in the examination room.  VITAL SIGNS:  Temperature 97.8.  Blood pressure 199/112.  Pulse 102.  Respirations 18.  Oxygen saturation 100% on 2 liters oxygen.  HEENT:  Oropharynx clear.  NECK:  Supple.  LUNGS:  Clear to auscultation bilaterally.  HEART:  Rate distant but regular.  ABDOMEN:  Slightly tender over suprapubic region.  She has mild  costovertebral angle tenderness on the left greater than right.  EXTREMITIES:  She has no palpable thrill to her left radial artery site.  She has trace peripheral edema.  NEUROLOGIC:  Nonfocal.   LABORATORY DATA:  Creatinine 10.9; it was 6.3 on July 18, 2004.  Sodium  136, potassium 4.3, chloride 113.  BUN 56; it was 30 in November 2005.  White count 17,700 with a left shift, 88% neutrophils,  hemoglobin 12.9,  platelets 420,000.   IMPRESSION AND PLAN:  A 40 year old with end-stage renal disease, chronic  kidney disease, hypertension, and diabetes, now with pyelonephritis, who  presents with intractable nausea and vomiting and poor peripheral IV access.  The patient will be admitted.  We have consulted renal and CVTS service, who  are aware of her issues.  We will place a PICC line in the right arm, saving  the left arm for revision of AV access for future dialysis.  Will start  antibiotics, antiemetics, and control her blood pressure with IV labetalol.  Will also get serial cardiac markers.  Check a 12-lead EKG.  Begin IV fluid  hydration with the understanding that we need to be careful with her volume  status due to her poor kidney status.  At this time, the patient is making  urine, but we will have to monitor this as well.  Will place her on an ADA  diet if she can tolerate oral feeding.  Will advance her diet slowly.  Will  also check urine culture for sensitivity.  Add proton pump inhibitor for GI  prophylaxis and Lovenox for DVT prophylaxis.  Further clinical management  dependent on her clinical course.     RLK/MEDQ  D:  09/07/2004  T:  09/07/2004  Job:  [id number]   cc:   Lorelle Formosa, M.D.  [phone] [address]  [place]  [place] [address]  Fax: [phone]

## 2011-01-16 NOTE — Op Note (Signed)
[name], [name]               ACCOUNT NO.:  [id number]   MEDICAL RECORD NO.:  [id number]          PATIENT TYPE:  OIB   LOCATION:  2899                         FACILITY:  [facility]   PHYSICIAN:  Janetta Hora. Fields, MD  DATE OF BIRTH:  22-May-1971   DATE OF PROCEDURE:  03/11/2005  DATE OF DISCHARGE:  03/11/2005                                 OPERATIVE REPORT   PROCEDURE:  Thrombectomy and revision of right forearm arteriovenous graft.   PREOPERATIVE DIAGNOSIS:  Thrombosed arteriovenous graft.   POSTOPERATIVE DIAGNOSIS:  Thrombosed arteriovenous graft.   ANESTHESIA:  Local with IV sedation.   SURGEON:  Janetta Hora. Fields, MD   ASSISTANT:  Lujean Rave, PA   OPERATIVE FINDINGS:  1.  Small basilic vein with venous intimal hyperplastic narrowing.  2.  Revision was 6-mm PTFE.   OPERATIVE DETAILS:  After obtaining informed consent, the patient was taken  to the operating room.  The patient was placed in supine position on the  operating table.  After adequate sedation, the patient's entire right upper  extremity was prepped and draped in usual sterile fashion.  Local anesthesia  was infiltrated at a preexisting longitudinal incision above the antecubital  crease.  The incision was reopened and carried down through the subcutaneous  tissues down to the level of the graft.  Graft had been previously placed in  an end-to-side anastomosis.  The vein was quite sclerotic and narrowed.  The  anastomosis was dissected free and dissection proceeded up the basilic vein  to a more suitable portion of vein; this was in the mid upper arm.  Small  side branches were ligated and divided between silk ties.  During the  dissection, the patient experienced some transient numbness on the lateral  aspect of her fingers and this was thought to be due to either some traction  on the nerve or infiltration of local anesthetic; this numbness had resolved  completed by end of the case.  The vein was  dissected free circumferentially  through a more suitable portion.  The graft was then transected and the  arterial limb thrombectomized and the old portion of graft debrided away.  The distal vein was ligated with a 3-0 silk tie.  An arterialized plug was  recovered from the arterial limb of the graft and there was good arterial  inflow.  Graft was then clamped after the patient had been given 5000 units  of intravenous heparin.  The 6-mm PTFE graft was brought up in the operative  field and beveled.  This was then sewn end-to-end to the basilic vein using  a running 6-0 Prolene suture.  At completion of anastomosis, this was  thoroughly flushed with heparinized saline and clamped.  Graft was then cut  to length and sewn end-to-end to the old graft using a running 6-0 Prolene  suture.  Just prior to completion of the anastomosis, a flushing procedure  was performed and there was still some thrombus present.  The arterial limb  of the graft was again thrombectomized.  More thrombus was removed,  an  arterialized plug was also  removed and there was good arterial inflow at  this point.  Two full passes of the thrombectomy catheter were made without  retrieval of any further thrombus material.  Next, the anastomosis was  secured, clamps were released and there was a palpable thrill above the  level of the graft immediately afterwards.  Next, hemostasis was obtained  with thrombin and Gelfoam.  This was all thoroughly irrigated and then taken  out of the wound.  Subcutaneous tissues were then reapproximated using a  running 3-0 Vicryl suture.  The skin was then closed with a 4-0 Vicryl  subcuticular stitch.  The patient tolerated the procedure well and there  were no immediate complications.  She had a weakly palpable radial pulse at  the end of the case.  Subcutaneous tissues were reapproximated with a  running 3-0 Vicryl suture.  Skin was closed with a 4-0 Vicryl subcuticular  stitch.  The  patient tolerated procedure well and there were no immediate  complications.  Instrument, sponge and needle count was correct at the end  of the case.  The patient was taken to the recovery room in stable  condition.       CEF/MEDQ  D:  03/11/2005  T:  03/12/2005  Job:  [id number]

## 2011-01-16 NOTE — Op Note (Signed)
NAME:  [name], [name]               ACCOUNT NO.:  [id number]   MEDICAL RECORD NO.:  [id number]          PATIENT TYPE:  OUT   LOCATION:  MDC                          FACILITY:  [facility]   PHYSICIAN:  Janetta Hora. Fields, MD  DATE OF BIRTH:  11-Jan-1971   DATE OF PROCEDURE:  10/04/2005  DATE OF DISCHARGE:  10/06/2005                                 OPERATIVE REPORT   PROCEDURE:  Left forearm arteriovenous graft.   PREOPERATIVE DIAGNOSIS:  End-stage renal disease.   POSTOPERATIVE DIAGNOSIS:  End-stage renal disease.   ANESTHESIA:  Local with IV sedation.   SURGEON:  Janetta Hora. Fields, M.D.   ASSISTANT:  Nurse.   OPERATIVE FINDINGS:  One 6-mm PTFE.   OPERATIVE DETAILS:  After obtaining informed consent, the patient was taken  to the operating room.  The patient was placed in the supine position on the  operating table.  After adequate sedation, the patient's entire left upper  extremity was prepped and draped in the usual sterile fashion.  Local  anesthesia was infiltrated near the antecubital crease.  A transverse  incision was made in this location and carried down through subcutaneous  tissues down to level of the brachial artery.  An adjacent vein of suitable  quality was also dissected free circumferentially.  Small side branches  ligated and divided between silk ties.  Next, a subcutaneous tunnel was  created with a loop configuration down the forearm with a transverse  incision in the distal forearm for assistance in tunneling.  The 6-mm PTFE  graft was then brought through the subcutaneous tunnel. The patient was then  given 5000 units of intravenous heparin.  The brachial artery was controlled  proximal and distally. Longitudinal arteriotomy was made.  The graft was  slightly beveled and sewn end of the graft to the side of artery using a  running 6-0 Prolene suture.  Just prior to completion of the anastomosis,  this was fore bled/back bled and thoroughly flushed.  Graft was  then clamped  at its apex.  Graft was then pulled taut to length and also beveled.  The  vein was then controlled proximal and distal with fine bulldog clamps.  A  longitudinal venotomy was made.  The graft was then sewn end of graft to  side of vein using a running 6-0 Prolene suture.  Just prior to completion  of the anastomosis this was fore bled/back bled and thoroughly flushed.  Next, the anastomosis was secured.  Clamps were released.  There was a  palpable thrill in the graft immediately.  Hemostasis was obtained.  Subcutaneous tissues reapproximated using running 3-0 Vicryl suture.  Skin  was closed with a 4-0 Vicryl subcuticular stitch.  The patient had an  audible radial Doppler signal at the end of the case.           ______________________________  Janetta Hora. Fields, MD    CEF/MEDQ  D:  10/07/2005  T:  10/08/2005  Job:  [id number]

## 2011-01-16 NOTE — Op Note (Signed)
[name], [name]               ACCOUNT NO.:  [id number]   MEDICAL RECORD NO.:  [id number]          PATIENT TYPE:  AMB   LOCATION:  SDS                          FACILITY:  [facility]   PHYSICIAN:  Balinda Quails, M.D.    DATE OF BIRTH:  October 08, 1970   DATE OF PROCEDURE:  DATE OF DISCHARGE:  12/16/2005                                 OPERATIVE REPORT   SURGEON:  Denman George, MD   ASSISTANT:  Gershon Crane, PA.   ANESTHETIC:  Local MAC.   ANESTHESIOLOGIST:  Dr. Jacklynn Bue.   PREOP DIAGNOSES:  1.  End-stage renal failure.  2.  Clotted left forearm loop arteriovenous graft.   POSTOPERATIVE DIAGNOSES:  1.  End-stage renal failure.  2.  Clotted left forearm loop arteriovenous graft.   PROCEDURE:  Thrombectomy, revision left forearm loop arteriovenous graft.   OPERATIVE PROCEDURE:  The patient brought to the operating in stable  condition.  Placed in supine position.  Left arm prepped and draped in  sterile fashion.  Skin, subcutaneous tissues instilled 1% Xylocaine with  epinephrine.   Longitudinal skin incision made through the scar in the left antecubital  fossa.  Dissection carried down expose the venous limb of the Gore-Tex  graft.  This was followed down to an end-to-end anastomosis to the deep  brachial vein.  The vein further mobilized proximally in the arm, controlled  with Gregory clamp.  The venous anastomosis taken down.  There was thrombus  in the vein which was thrombectomized with a 4 Fogarty catheter.  The vein  then flushed with heparin saline solution.  The graft thrombectomized  several times with a 4 Fogarty catheter.  Excellent flow obtained.  Graft  filled heparin saline solution controlled with fistula clamp.   The new segment of 6 mm Gore-Tex was anastomosed end-to-end to divided graft  using running 6-0 Prolene suture.  This was then anastomosed end-to-end to  the vein with running 6-0 Prolene suture.  Clamps were removed.  Excellent  flow present.  Adequate  hemostasis obtained.  Sponges instrument counts  correct.   Subcutaneous tissue closed running 3-0 Vicryl suture in two layers.  Skin  closed 4-0 Monocryl.  Steri-Strips applied.   The patient tolerated procedure well.  No apparent complications.  Transferred to recovery room in stable condition.      Balinda Quails, M.D.  Electronically Signed     PGH/MEDQ  D:  12/16/2005  T:  12/17/2005  Job:  [id number]

## 2011-01-16 NOTE — H&P (Signed)
NAME:  [name], [name]               ACCOUNT NO.:  [id number]   MEDICAL RECORD NO.:  [id number]          PATIENT TYPE:  INP   LOCATION:  1827                         FACILITY:  [facility]   PHYSICIAN:  Wilber Bihari. Caryn Section, M.D.   DATE OF BIRTH:  1971-03-05   DATE OF ADMISSION:  12/06/2005  DATE OF DISCHARGE:                                HISTORY & PHYSICAL   SERVICE:  Renal Service.   CHIEF COMPLAINT:  Shortness of breath.   PRESENT ILLNESS:  The patient is a 40 year old female with a history of end-  stage renal disease, with past medical history below, who present with  increased shortness of breath, and cough with yellow sputum, all x1 day.  She has had chills today.  She has had no chest pain.  She vomited in the ED  twice.  She has noted no blood in the vomitus and the vomitus did look like  food; she had recently eaten.  She has had episodes of diarrhea today, but  no bright red blood per rectum.  At the time of the exam, she is on 2 L of  O2 with a significantly decreased shortness of breath from presentation.   PAST MEDICAL HISTORY:  1.  End-stage renal disease, unknown dry weight.  She has hemodialysis on      Monday/Wednesday/Friday at the [facility].  2.  Hyperparathyroidism.  3.  Anemia.  4.  Diabetes.  5.  Hypertension.  6.  Gastroesophageal reflux disease.  7.  Medication noncompliance.   FAMILY HISTORY:  Mother with end-stage renal disease and diabetes.  Father  with hypertension.   SOCIAL HISTORY:  No tobacco.  No illicits.  No alcohol.  She has 2 kids, age  14 and 58.   DRUG ALLERGIES:  No known drug allergies.   MEDICATIONS:  1.  Labetalol 400 mg p.o. b.i.d.  2.  Aspirin 81 mg p.o. daily.  3.  Reglan 10 mg p.o. q.a.c.  4.  Protonix 40 mg p.o. daily.  5.  Lantus 34 units q.a.m. and 18 units q.p.m.  6.  Renovite one p.o. daily.   She has taken no medications in the last 24 hours with the exception of  insulin.   REVIEW OF SYSTEMS:   Noncontributory.   PHYSICAL EXAMINATION:  VITAL SIGNS:  Temperature 101.2, pulse 115,  respiratory rate 22, blood pressure initially 215/101; this was rechecked  and it was found to be 152/83.  O2 SAT was initially 99% on room air and  decreased to 91% and now 100% on 2 L.  GENERAL:  She is on O2, in no apparent distress.  She is alert and oriented  x3.  HEENT:  Tympanic membranes within normal limits bilaterally.  Mucous  membranes are mildly dry.  Eyes mildly icteric.  Extraocular movements  intact.  NECK:  Supple.  No lymphadenopathy.  CHEST:  Decreased breath sounds at the bases bilaterally.  BREASTS:  Breast exam deferred.  HEART:  Mildly tachycardic with a heart rate of 105-110 with a systolic  ejection murmur 2/6 at the left upper sternal border.  ABDOMEN:  Soft, nontender and non-distended.  Positive bowel sounds.  BACK:  No CVA pain.  GU:  Deferred.  EXTREMITIES:  Left graft with bruit noted.  No erythema at this site.  She  has chronic skin changes secondary to diabetes.  She has abrasions on the  back of her left leg without signs of cellulitis; she says she has been  scratching at her leg.  Otherwise, she has 2+ dorsalis pedis and radial  pulses.  She has no skin breakdown on her feet.  NEUROLOGIC:  Grossly intact.   LABORATORY AND ACCESSORY CLINICAL DATA:  Chest x-ray showing bilateral  basilar edema versus pneumonia.   White count 17.3, neutrophils 89%, hemoglobin 13.7, hematocrit 39.4,  platelets 249,000.  Lipase 98.  I-STAT electrolytes:  Sodium 128, potassium  4.4, chloride 98, bicarb 22, BUN 58, creatinine 1.8, blood glucose 452, pH  7.34, PCO2 41.   IMPRESSION:  Pneumonia.   PLAN:  1.  Pneumonia:  We will admit her and panculture and then cover with Avelox      for community-acquired pneumonia, check Legionella antigen in the urine;      she does still make some urine.  Cellulitis, fluid and the urinary      source are unlikely cause of present symptoms.   Since she does have      scleral icterus noted, I will check liver function tests, though would      doubt that abdominal process is the cause of this, as her abdominal exam      is completely benign.  2.  End-stage renal disease:  Hemodialysis Monday/Wednesday/Friday.  We will      get outpatient records and arrange for hemodialysis in the morning.  3.  Hyperparathyroidism:  We will get old records and arrange for medication      as needed in dialysis.  4.  Anemia:  Again, we will get old records and arrange for IR iron if      needed in dialysis.  5.  Diabetes:  Lantus and Reglan plus sliding-scale insulin.  6.  Hypertension:  Continue labetalol and aspirin.  7.  Gastroesophageal reflux disease:  Continue PPI.  8.  DVT prophylaxis:  We will place the patient on Lovenox.  9.  Diet:  Renal diet.  10. Dehydration:  This is likely contributing to her tachycardia and what      sounds to be a systolic ejection murmur consistent with a flow murmur      and the patient has no known history of a murmur, though I doubt this to      be      endocarditis, given her pulmonary findings by history, exam and chest x-      ray; this can be followed.  11. Code status:  Full code.   DISPOSITION:  Admit.      Dwana Curd Para March, M.D.    ______________________________  Wilber Bihari. Caryn Section, M.D.    GSD/MEDQ  D:  12/07/2005  T:  12/07/2005  Job:  [id number]

## 2011-01-16 NOTE — Op Note (Signed)
[name], [name]               ACCOUNT NO.:  [id number]   MEDICAL RECORD NO.:  [id number]          PATIENT TYPE:  AMB   LOCATION:  SDS                          FACILITY:  [facility]   PHYSICIAN:  Balinda Quails, M.D.    DATE OF BIRTH:  04/07/1971   DATE OF PROCEDURE:  01/18/2006  DATE OF DISCHARGE:  01/18/2006                                 OPERATIVE REPORT   SURGEON:  Denman George, MD   ASSISTANT:  a Zadie Rhine, PA.   ANESTHETIC:  Local MAC.   ANESTHESIOLOGIST:  Dr. Jean Rosenthal.   PREOPERATIVE DIAGNOSES:  1.  End-stage renal failure.  2.  Clotted left forearm loop AV graft.   POSTOPERATIVE DIAGNOSES:  1.  End-stage renal failure.  2.  Clotted left forearm loop AV graft.   PROCEDURE:  Thrombectomy and revision left forearm loop arteriovenous graft.   OPERATIVE PROCEDURE:  The patient brought to the operating room stable  condition.  Placed in supine position.  Left arm prepped and draped in  sterile fashion.   Skin, subcutaneous tissues instilled with 1% Xylocaine with epinephrine.  Longitudinal skin incision made through the scar in the left antecubital  fossa.  Dissection carried down to expose the venous anastomosis.  Anastomosis was to the deep brachial vein.  The deep brachial vein further  mobilized proximally in the arm.  The venous anastomosis taken down.  Beveled.  The vein was diseased, thickened with pseudointima.  The vein  trimmed proximally.  Thrombectomized with a 4 Fogarty catheter.  The graft  thrombectomized with a 4 Fogarty catheter.  Several passes made.  Excellent  arterial inflow obtained.  A graft filled with heparin saline solution.  A  new segment of 6 mm Gore-Tex was anastomosed end-to-end to the divided graft  using running 6-0 Prolene suture.  This was beveled and anastomosed end-to-  end to the outflow vein using running 6-0 Prolene suture.  Clamps were  removed.  Excellent flow present.  Adequate hemostasis obtained.  Sponge  instrument  counts correct.   Subcutaneous tissue closed running 3-0 Vicryl suture.  Skin closed 4-0  Monocryl.  Steri-Strips applied.  The patient tolerated procedure well.  No  apparent complications.  Transferred recovery room in stable condition.      Balinda Quails, M.D.  Electronically Signed     PGH/MEDQ  D:  01/18/2006  T:  01/19/2006  Job:  [id number]

## 2011-01-16 NOTE — Op Note (Signed)
NAMEBURNIE, [name]               ACCOUNT NO.:  [id number]   MEDICAL RECORD NO.:  [id number]          PATIENT TYPE:  INP   LOCATION:  5708                         FACILITY:  [facility]   PHYSICIAN:  [name]. Hart Rochester, M.D.  DATE OF BIRTH:  [birth year]   DATE OF PROCEDURE:  07/18/2004  DATE OF DISCHARGE:                                 OPERATIVE REPORT   PREOPERATIVE DIAGNOSIS:  End-stage renal disease.   POSTOPERATIVE DIAGNOSIS:  End-stage renal disease.   OPERATION:  Creation of a left radial artery to cephalic vein arteriovenous  fistula (Cimino shunt).   SURGEON:  [name]. Hart Rochester, M.D.   FIRST ASSISTANT:  Jerold Coombe, P.A.   ANESTHESIA:  Local.   PROCEDURE:  The patient was taken to the operating room and placed in the  supine position, at which time the left upper extremity was prepped with  Betadine scrub and solution and draped in the routine sterile manner.  After  infiltration of 1% Xylocaine, a longitudinal incision was made between the  radial artery and cephalic vein near the wrist.  The cephalic vein was  dissected free, ligated distally, its branches ligated with 4-0 and 5-0 silk  ties and divided.  It was mobilized, gently dilated with heparinized saline.  It would accept a 4 mm dilator distally and could be palpated up to the  antecubital area.  A 3 Fogarty catheter was also passed proximally, and it  was noted to be widely patent up to the antecubital area.  The radial artery  was exposed, encircled with vessel loops.  It was a 2.5 mm vessel with a  good pulse and free of disease.  Heparin 3000 units given intravenously.  The artery was occluded proximally and distally with vessel loops, opened  with a 15 blade, extended with the Potts scissors.  A 2.5 dilator would  traverse it proximally and distally.  The vein was carefully spatulated and  anastomosed end-to-side with 6-0 Prolene, vessel loops, released, and there  was a palpable pulse and a weak thrill in  the anastomotic area with audible  Doppler flow up to the antecubital area.  No protamine was given.  The wound  was irrigated with saline, closed in layers with Vicryl in a subcuticular  fashion, a sterile dressing applied and the patient taken to the recovery  room in satisfactory condition.       JDL/MEDQ  D:  07/18/2004  T:  07/19/2004  Job:  [id number]

## 2011-01-16 NOTE — Op Note (Signed)
   NAME:  [name], [name]                         ACCOUNT NO.:  [id number]   MEDICAL RECORD NO.:  [id number]                   PATIENT TYPE:  AMB   LOCATION:  DSC                                  FACILITY:  [facility]   PHYSICIAN:  Cindee Salt, M.D.                    DATE OF BIRTH:  18-Feb-1971   DATE OF PROCEDURE:  DATE OF DISCHARGE:                                 OPERATIVE REPORT   PREOPERATIVE DIAGNOSIS:  Laceration left hand.   POSTOPERATIVE DIAGNOSIS:  Laceration left hand.   OPERATION:  Exploration repair of juncture middle ring finger, partial  tendon laceration middle finger extensor tendon left hand.   SURGEON:  Cindee Salt, M.D.   ASSISTANT:  Junius Roads. Bobbe Medico.   ANESTHESIA:  General.   DATE OF OPERATION:  07/13/02.   HISTORY:  The patient is a 40 year old female who suffered a laceration of  the dorsal aspect of her left hand.  She is admitted for exploration in her  extensor tendons due to pain with resisted extension.   PROCEDURE:  The patient was brought to the operating room and general  anesthetic carried out without difficulty.  She was prepped and draped using  Duraprep and supine position.  The left arm was free.  The limb was  exsanguinated with an Esmarch bandage.  Tourniquet placed high on the arm  and inflated to 250 mmHg.  The sutures were removed.  The wound was opened.  Very superficial laceration to the extensor tendon in the middle finger was  identified.  Ring finger extensor tendon was intact.  The juncture between  was lacerated.  This was repaired with figure-of-eight 4-0  Vicryl sutures.  The wound was irrigated.  The skin was closed with interrupted 5-0 Nylon  sutures.  Sterile compressive dressing and wrist splint dorsiflexed in  position and placed.  The patient was taken to recovery room for observation  in satisfactory condition.  She is discharged home currently _______________  in one week on Vicodin and Keflex.                           Cindee Salt, M.D.    GK/MEDQ  D:  07/13/2002  T:  07/13/2002  Job:  [id number]

## 2011-01-16 NOTE — H&P (Signed)
NAME:  [name], RESENDES NO.:  [id number]   MEDICAL RECORD NO.:  [id number]          PATIENT TYPE:  EMS   LOCATION:  MAJO                         FACILITY:  [facility]   PHYSICIAN:  Mallory Shirk, MD     DATE OF BIRTH:  07/22/1971   DATE OF ADMISSION:  07/15/2004  DATE OF DISCHARGE:                                HISTORY & PHYSICAL   PRIMARY CARE PHYSICIAN:  Lorelle Formosa, M.D.   CHIEF COMPLAINT:  Episodic abdominal pain x2 weeks.   HISTORY OF PRESENT ILLNESS:  [name] is a very pleasant 40 year old  African-American woman with insulin-requiring diabetes mellitus since age  62, who presented to the emergency department with complaints of stomach  pain.  She states her pain is episodic.  It started about 2 weeks ago,  associated with some diarrhea, which resolved, and it came back.  Yesterday,  she had one episode of vomiting, and decided to come to the ED.  The patient  was able to tolerate some p.o. this morning, but has not been able to eat  since breakfast time.  At the time of exam, the patient is walking around  the halls of the ED, using the public telephone, in no acute distress, even  though she says her pain is 5/10.  The patient has no other complaints.   PAST MEDICAL HISTORY:  1.  Hypertension.  2.  Diabetes mellitus, insulin requiring.  3.  Renal insufficiency.  Creatinine of 1.8 one year ago per primary care      physician's office.   MEDICATIONS ON ADMISSION:  1.  Lotrel, dose unclear.  2.  HCTZ 25 mg p.o. daily.  3.  Insulin 70/30, 22 units a.m., 10 units p.m.   ALLERGIES:  No known drug allergies.   SOCIAL HISTORY:  The patient lives by herself.  No alcohol, tobacco, or drug  use.   FAMILY HISTORY:  Significant for diabetes and hypertension in mother.  CVA  in father.  Hypertension in sister and 2 brothers.   REVIEW OF SYSTEMS:  The patient denies headaches, changes in vision,  dysphagia, chest pain, shortness of breath, change in  bowel habits, dysuria,  polyuria, or any joint pain.  The patient does complain of the episodic  stomach pain described above in the history of present illness.   PHYSICAL EXAMINATION ON ADMISSION:  GENERAL:  Young African-American woman  in no acute distress, ambulating around the ED independently.  Alert and  oriented x3.  Cooperative with the exam.  HEENT:  Normocephalic and atraumatic.  Pupils equal, round and reactive to  light.  Sclerae anicteric.  Mucous membranes moist.  NECK:  Supple.  No JVD.  No __________.  LUNGS:  Clear to auscultation bilaterally.  No wheezes, no rales.  CVS:  S1 and S2.  Regular rate and rhythm.  No murmurs, rubs, or gallops.  ABDOMEN:  Minimal tenderness in the right upper quadrant.  Soft.  Positive  bowel sounds.  No rebound, no guarding.  EXTREMITIES:  There was 1+ pitting edema in the lower extremities  bilaterally.  NEUROLOGIC:  Cranial nerves II-XII grossly intact.  Motor within normal  limits.  Gait normal.  DTRs 2+ and symmetrical in all 4 extremities.   LABORATORIES ON ADMISSION:  WBC's 7.3, hemoglobin 8.1, hematocrit 23.7, MCV  75.2, platelets 372.  Sodium 141, potassium 3.5, chloride 113, BUN 33,  creatinine 9.3, glucose 117.  pH of 7.28, PCO2 of 35.9, bicarb 16.9.  Abdominal ultrasound revealed no active disease.   ASSESSMENT AND PLAN:  A 40 year old African-American woman with a history of  hypertension, diabetes mellitus, presenting with stomach pain that was  episodic x2 weeks, acute renal failure.  The patient was admitted to the  floor.  1.  Abdominal pain.  The patient's pain was minimal at the time of the exam.      The patient is ambulatory without difficulty.  She will be given      Protonix 40 mg p.o. daily.  ADA 1800 diet as tolerated.  No episodes of      nausea or vomiting in the emergency department.  2.  Acute renal failure.  BUN and creatinine 33 and 9.3.  The creatinine was      1.8 per primary care physician at last year's  exam.  Renal consult will      be requested for evaluation.  This could be secondary to diabetes.  3.  Diabetes mellitus.  The patient will be placed on sliding scale insulin      after 24 hours.  Her 70/30 will be resumed appropriately.  4.  Hypertension.  Her antihypertensives are held at this time pending renal      evaluation.  5.  Acidosis.  The patient's pH is 7.28 with a bicarb of 16.9, with an anion      gap of 11; however, the bicarb values from an ABG were as the      electrolytes from the BMP.  BMP and CBC will be repeated in the a.m.  6.  Anemia.  The patient's hemoglobin and hematocrit are 8.1 and 23.7.  At      this time, we do not have baseline values.  The patient says that she      has been on iron before, but she has not been taking it for over 2      years.  Iron studies will be done, and the patient will be treated      appropriately.  If BUN and creatinine fall significantly, we will      transfuse her as needed.  7.  Prophylaxis.  The patient is on Protonix 40 mg p.o. daily.  The patient      is ambulatory and started with the prophylaxis at this time.  8.  Disposition.  After resolution of __________ issues and evaluation by      renal, the patient will be discharged to home with follow up with her      primary care physician, Dr. Billee Cashing.      GDK/MEDQ  D:  07/15/2004  T:  07/15/2004  Job:  [id number]

## 2011-01-16 NOTE — H&P (Signed)
NAME:  [name], [name] NO.:  [id number]   MEDICAL RECORD NO.:  [id number]          PATIENT TYPE:  EMS   LOCATION:  MAJO                         FACILITY:  [facility]   PHYSICIAN:  Mallory Shirk, MD     DATE OF BIRTH:  1971/01/15   DATE OF ADMISSION:  07/15/2004  DATE OF DISCHARGE:                                HISTORY & PHYSICAL   ADDENDUM:  Urinalysis showed positive leukocyte esterase with WBCs too-  numerous-to-count.  Patient will be placed on ciprofloxacin 250 mg p.o.  b.i.d.   Dr. Fayrene Fearing Deterding, renal, has been called, and he will evaluate the  patient this evening.      GDK/MEDQ  D:  07/15/2004  T:  07/15/2004  Job:  [id number]

## 2011-01-16 NOTE — Op Note (Signed)
NAME:  [name], [name]               ACCOUNT NO.:  [id number]   MEDICAL RECORD NO.:  [id number]          PATIENT TYPE:  AMB   LOCATION:  SDS                          FACILITY:  [facility]   PHYSICIAN:  Janetta Hora. Fields, MD  DATE OF BIRTH:  Jan 06, 1971   DATE OF PROCEDURE:  01/28/2006  DATE OF DISCHARGE:  01/28/2006                                 OPERATIVE REPORT   SURGEON:  Janetta Hora. Fields, MD.   ASSISTANT:  Jerold Coombe, P.A.   PROCEDURE:  Right upper arm arteriovenous graft.   PREOPERATIVE DIAGNOSIS:  End-stage renal disease.   POSTOPERATIVE DIAGNOSIS:  End-stage renal disease.   ANESTHESIA:  Local with IV sedation.   OPERATIVE FINDINGS:  1. A 4 to 7-mm tapered graft.   OPERATIVE DETAILS:  After obtaining informed consent, the patient was taken  to the operating room.  The patient was placed in supine position on the  operating table.  After adequate sedation, the patient's entire right upper  extremity was prepped and draped in the usual sterile fashion.  Local  anesthesia was infiltrated near the antecubital crease.  Incision was made  in his location down through the subcutaneous tissues, down to the level of  the brachial artery.  The brachial artery was dissected free  circumferentially and controlled proximally and distally with vessel loops.  Next, attention was turned to the axilla.  A longitudinal incision was made  in this location and carried down through the subcutaneous tissues, down to  the level of the axillary vein.  The vein was of good quality.  It was  dissected free circumferentially.  The patient then had a subcutaneous  tunnel created, connecting the lower arm incision with the upper arm  incision, and a 4 to 7-mm tapered PTFE graft was brought through this  subcutaneous tunnel.  The patient was then given 5000 units of intravenous  heparin.  The 4-mm end of the graft was slightly beveled.  The vessel loops  were pulled taut.  A longitudinal  arteriotomy was made.  The graft was then  sewn end of graft to side of artery using a running 6-0 Prolene suture.  Just prior to completion of the anastomosis, this was forebled, backbled and  thoroughly flushed.  The anastomosis was secured.  Clamps were released.  A  clamp was moved up to the axillary incision.  The distal axillary vein was  then suture ligated and divided.  It was controlled proximally with a fine  bulldog clamp.  The graft was beveled and sewn end of graft to end of vein  using a running 6-0 Prolene suture.  Just prior to completion of the  anastomosis, this was forebled, backbled and thoroughly flushed.  The  anastomosis was secured.  Clamps were released.  There was a palpable thrill  in the graft immediately.  The patient had an audible radial Doppler signal  at the end of the case.  Hemostasis was obtained.  Subcutaneous tissues  were reapproximated using running 3-0 Vicryl suture.  The skin was closed  with a 4-0 Vicryl subcuticular stitch.  The  patient tolerated the procedure  well and there were no complications.  Instrument, sponge and needle count  was correct at the end of the case.  The patient was taken to the recovery  room in stable condition.      Janetta Hora. Fields, MD  Electronically Signed     CEF/MEDQ  D:  04/01/2006  T:  04/01/2006  Job:  [id number]

## 2011-01-16 NOTE — Op Note (Signed)
NAMEREAGYN, FACEMIRE               ACCOUNT NO.:  [id number]   MEDICAL RECORD NO.:  [id number]          PATIENT TYPE:  AMB   LOCATION:  SDS                          FACILITY:  [facility]   PHYSICIAN:  [name]. Hart Rochester, M.D.  DATE OF BIRTH:  10-01-1970   DATE OF PROCEDURE:  10/26/2005  DATE OF DISCHARGE:  10/26/2005                                 OPERATIVE REPORT   PREOP DIAGNOSIS:  Thrombosed AV Gore-Tex graft, left forearm with end-stage  renal disease.   POSTOP DIAGNOSIS:  Thrombosed AV Gore-Tex graft, left forearm with end-stage  renal disease.   OPERATION:  Thrombectomy, AV Gore-Tex graft, left forearm with insertion new  segment graft from existing graft to brachial vein with 6 mm Gore-Tex.   SURGEON:  Dr. Hart Rochester.   FIRST ASSISTANT:  Coral Ceo.   ANESTHESIA:  Local.   PROCEDURE:  Placed on the operating room, placed in supine position at which  time the left upper extremity was prepped with Betadine scrub solution and  draped in routine sterile manner. After infiltration of 1% Xylocaine with  epinephrine, longitudinal incision was made through the antecubital area  over the Gore-Tex to brachial vein anastomosis. This was directly anterior  to the arterial anastomosis and was carefully dissected free from the artery  and 3000 units of heparin given intravenously. Transverse opening was made  in the graft. The graft itself easily thrombectomized on the arterial limb  with excellent flow being present. There was almost total obstruction at the  venous anastomosis and this was all mobilized.  The vein proximal to this  was 4.5 mm in size. A Fogarty catheter would traverse this throughout the  entire arm into the chest. Therefore the old anastomosis was completely  excised and a new piece of 6 mm graft anastomosed end-to-end to the old  graft and end-to-end to the more proximal vein after spatulating both ends  using 6-0 Prolene. Clamps then released and there was an excellent  pulse and  thrill in the graft. No protamine was given.  Wound was irrigated with  saline, closed in layers with Vicryl in subcuticular fashion. Sterile  dressing applied. The patient taken to recovery in satisfactory condition.           ______________________________  [name] Hart Rochester, M.D.     JDL/MEDQ  D:  10/26/2005  T:  10/27/2005  Job:  [id number]

## 2011-01-16 NOTE — Op Note (Signed)
NAMEBRIDGET, [place]               ACCOUNT NO.:  [id number]   MEDICAL RECORD NO.:  [id number]          PATIENT TYPE:  OIB   LOCATION:  2899                         FACILITY:  [facility]   PHYSICIAN:  Janetta Hora. Fields, MD  DATE OF BIRTH:  12-Sep-1970   DATE OF PROCEDURE:  10/20/2004  DATE OF DISCHARGE:  10/20/2004                                 OPERATIVE REPORT   PROCEDURE:  Thrombectomy and revision right forearm AV graft.   PREOPERATIVE DIAGNOSIS:  Thrombosed AV graft.   POSTOPERATIVE DIAGNOSIS:  Thrombosed AV graft.   ANESTHESIA:  Local with IV sedation.   ASSISTANT:  Rowe Clack, P.A.-C.   INDICATIONS:  The patient is a 40 year old female who had right forearm AV  graft placed approximately six weeks ago. Graft has now thrombosed. She  needs long-term hemodialysis access.   OPERATIVE FINDINGS:  1.  Hyperplastic narrowing of venous anastomosis.  2.  Revision with 6 mm interposition graft end-to-side to the basilic vein.   OPERATIVE DETAIL:  After obtaining informed consent, the patient was taken  to the operating room. Patient placed in supine position on operating table.  After adequate sedation, the patient's entire right upper extremity was  prepped and draped in usual sterile fashion. Local anesthesia was  infiltrated at a preexisting old scar in the antecubital crease. This  reopened and carried down through subcutaneous tissues and the venous limb  of the graft which was on the ulnar aspect of the forearm was dissected free  circumferentially. The vein was very sclerotic in this area and nearly  obliterated. The graft was transected and the patient given 5000 units of  intravenous heparin. An attempt was made to place a 3 dilator through the  venous anastomosis but it would not accept this.  Next a #4 Fogarty catheter  was passed down the graft and the arterial limb of graft was easily  thrombectomized. All thrombotic material was removed. There was good inflow  at  this point. There was one needle hole bleeding on the arterial limb of  graft and this was repaired with a single Vicryl stitch. Next, attention was  turned toward finding a suitable vein. Local anesthesia was infiltrated just  above the antecubital crease and longitudinal incision made and carried down  through subcutaneous tissues down to level of the basilic vein. This was  dissected free circumferentially. This was approximately 3.5 mm in diameter.  This was controlled proximally and distally with fine bulldog clamps. Side  branches were ligated and divided between silk ties. The vein was then  opened longitudinally with Potts scissors. A new 6 mm interposition graft  was brought to the operative field. This was beveled and sewn end-of-graft  to side-of-vein using running 6-0 Prolene suture. This was then tunneled  underneath the skin bridge. It was thoroughly flushed with heparinized  saline. It was then cut to length and then sewn end-to-end to the old graft  using running 6-0 Prolene suture. This was fore bled, back bled and  thoroughly flushed. Anastomosis was then secured. Clamps were released.  There was palpable thrill in the  graft immediately. Hemostasis was obtained.  Subcutaneous tissues of both incisions reapproximated using running 3-0  Vicryl suture.  Skin of both incisions closed with a 4-0 Vicryl subcuticular stitch. The  patient tolerated procedure well. There were no complications. Instrument,  sponge, needle counts correct at the case. The patient was taken to recovery  room in stable condition.      CEF/MEDQ  D:  10/20/2004  T:  10/21/2004  Job:  [id number]

## 2011-01-16 NOTE — Discharge Summary (Signed)
[name], WINIECKI               ACCOUNT NO.:  [id number]   MEDICAL RECORD NO.:  [id number]          PATIENT TYPE:  INP   LOCATION:  5501                         FACILITY:  [facility]   PHYSICIAN:  Wilber Bihari. Caryn Section, M.D.   DATE OF BIRTH:  Jan 26, 1971   DATE OF ADMISSION:  12/06/2005  DATE OF DISCHARGE:  12/08/2005                                 DISCHARGE SUMMARY   DISCHARGE DIAGNOSES:  1.  Pneumonia.  2.  End-stage renal disease.  3.  Hyperparathyroidism.  4.  Anemia.  5.  Diabetes.  6.  Hypertension.   DISCHARGE MEDICATIONS:  1.  Labetalol 400 mg p.o. b.i.d.  2.  Aspirin 81 mg p.o. every day.  3.  Reglan 10 mg p.o. t.i.d. with meals.  4.  Protonix 40 mg p.o. every day.  5.  Lantus 34 units subcu q.a.m., 18 units subcu q.p.m.  6.  Renavite one p.o. every day.  7.  Epogen 2,500 units at hemodialysis Monday, Wednesday, Friday.  8.  Hectorol 1 mcg at hemodialysis Monday, Wednesday, Friday.  9.  Doxycycline 100 mg, one tab p.o. every day x8 days for a total of 10      days antibiotics.   The patient is to follow up with hemodialysis __________ Wednesday.  She is  being discharged on a Tuesday.   PROCEDURES/DIAGNOSTIC STUDIES:  Chest x-ray showing bibasilar edema versus  infiltrate at admission.   CONSULTANTS:  None.   ADMISSION HISTORY AND PHYSICAL:  The patient is a 41 year old diabetic  female with a history of end-stage renal disease who presented with one day  of fever, cough with yellow sputum, and chills.  She was short of breath  before admission.  She came to the emergency room and was placed on oxygen  and felt subjectively better.  A chest x-ray had the findings as listed  above.  She had a leukocytosis with a white count of 17.3.  She was admitted  for dialysis and for IV antibiotics for presumed pneumonia.   HOSPITAL COURSE:  1.  Pneumonia.  The patient was initially covered with Avelox.  She felt      significantly better within 24 hours.  The patient said she  did      significantly feel better after hemodialysis.  It is possible that the      patient had slight fluid overload in her lungs which contributed to her      intake shortness of breath, however, she did not look clinically      edematous especially in her lower extremities.  In any event because of      her fever, productive sputum and leukocytosis, she was covered with      antibiotics.  She defervesced and remained afebrile.  She was shortly      thereafter weaned off of oxygen without complication.  She was ready for      discharge on the morning of December 08, 2005.  2.  End-stage renal disease.  She had hemodialysis on schedule.  Her dry      weight has been adjusted down by  0.5 kg.  Her new dry weight is 74.5 kg.      There are no changes in her hemodialysis orders.  3.  Hyperparathyroidism.  She is to continue on Hectorol.  4.  Anemia.  She is to continue on erythropoietin.  5  Diabetes.  She was continued on her Lantus without complication.  She is  to continue this as an outpatient.  1.  Hypertension.  The patient is to resume her labetalol.   DISCHARGE LABORATORY:  As discussed above.   I called over to the [facility] to let them know about the  patient being discharged.  I have also faxed a medication list with  admission information.      Dwana Curd Para March, M.D.    ______________________________  Wilber Bihari. Caryn Section, M.D.    GSD/MEDQ  D:  12/08/2005  T:  12/08/2005  Job:  [id number]   cc:   ? [facility]  [place], [place]

## 2011-01-16 NOTE — Op Note (Signed)
NAMECRIS, GIBBY               ACCOUNT NO.:  [id number]   MEDICAL RECORD NO.:  [id number]          PATIENT TYPE:  AMB   LOCATION:  SDS                          FACILITY:  [facility]   PHYSICIAN:  Larina Earthly, M.D.    DATE OF BIRTH:  Jan 27, 1971   DATE OF PROCEDURE:  01/19/2006  DATE OF DISCHARGE:  01/19/2006                                 OPERATIVE REPORT   PREOPERATIVE DIAGNOSIS:  End-stage renal disease.   POSTOPERATIVE DIAGNOSIS:  End-stage renal disease.   PROCEDURE:  Placement of right IJ Diatek catheter with ultrasound  visualization.   SURGEON:  Larina Earthly, M.D.   ASSISTANT:  Nurse   ANESTHESIA:  MAC.   COMPLICATIONS:  None.   DISPOSITION:  To recovery stable.   PROCEDURE IN DETAIL:  The patient was taken to the operating room and placed  in supine position. The area of the left and right neck were imaged with  ultrasound revealing patent jugular veins bilaterally.  Using local  anesthesia and a finder needle, the right internal jugular vein identified.  Next, using a Seldinger technique, a guidewire was passed centrally.  There  was initially a hangup with this, and contrast was injected through the 18-  gauge needle, revealing tortuosity but patency at the jugular vein.  Additional stick was made in the jugular vein at a different site, and the  catheter passed centrally.  The dilator and peel-away sheath was passed over  this, and the catheter was passed down the peel-away sheath which was then  removed.  Catheter was brought through subcutaneous tunnel, was positioned  at the level of the right atrial superior vena caval junction.  The two  lumen ports were attached, and both lumens flushed and aspirated easily.  The catheter was visualized throughout its course and was without kinking.  The catheter was secured to the skin with a 3-0 nylon stitch __________ 4-0  subcuticular Vicryl stitch.  Sterile dressing was applied.  The patient was  taken to recovery  room in stable condition.      Larina Earthly, M.D.  Electronically Signed     TFE/MEDQ  D:  01/19/2006  T:  01/20/2006  Job:  [id number]

## 2011-01-16 NOTE — Consult Note (Signed)
[name], [name]               ACCOUNT NO.:  [id number]   MEDICAL RECORD NO.:  [id number]          PATIENT TYPE:  INP   LOCATION:  4155                         FACILITY:  [facility]   PHYSICIAN:  James L. Deterding, M.D.DATE OF BIRTH:  03/01/1971   DATE OF CONSULTATION:  07/15/2004  DATE OF DISCHARGE:                                   CONSULTATION   REVIEW OF HISTORY:  This is a 40 year old, Afro-American female with type 2  diabetes mellitus, insulin requiring, hypertension, who presents with a two-  week history of on and off cramping abdominal pain associated with non-blood-  streaked loose stools, nausea, and vomiting.  The patient did not have any  fever, shortness of breath, hematuria, chest pain, polyphagia, polydipsia,  or vision changes.   PAST HISTORY:  1.  Type 2 diabetes mellitus diagnosed at age 46.  2.  Hypertension.  3.  Anemia.   SURGICAL HISTORY:  Nonhealing wound at the right foot, with skin graft over  the right foot.   MEDICATIONS:  1.  NPH 22 units in the a.m., and 10 unit subcutaneously at bedtime.  2.  Lotrel, dose unknown to patient.  3.  HCTZ 25 mg tablet daily.  4.  Motrin 1 tablet p.o. daily.   ALLERGIES:  No known drug allergies.   FAMILY HISTORY:  Diabetes mellitus in the mother, who has a kidney  transplant.  Hypertension in mother and siblings.  Cerebrovascular accident  in the father.   SOCIAL HISTORY:  The patient is single.  Has children ages 62 and 70 years  old.  Is unemployed.  There is no smoking or alcohol use.   REVIEW OF SYSTEMS:  Twelve-system review negative except for abdominal pain,  nausea, vomiting, diarrhea, and pruritus.  No fever, shortness of breath,  palpitations, chest pain, polyphagia, polydipsia, hematuria, vision changes,  headache.   PHYSICAL EXAMINATION:  BLOOD PRESSURE:  141/86.  HEART RATE:  88.  RESPIRATORY RATE:  18.  TEMPERATURE:  98.9.  O2 SATURATION:  99% on room air.  GENERAL:  The patient is awake,  alert, coherent, not in distress.  HEENT:  Pale conjunctivae.  Anicteric sclerae.  NECK:  No thyromegaly.  There is a carotid bruit on the left.  CVS:  Adynamic precordium.  Distant heart sounds.  Regular rate and rhythm.  Good S1 and S2.  No murmurs.  RESPIRATORY:  Clear to auscultation bilaterally.  No crackles, no wheezes,  no rhonchi.  ABDOMEN:  Positive bowel sounds, soft, nondistended.  No masses.  There is  slight direct tenderness of the epigastric area.  No rebound tenderness.  No  guarding.  EXTREMITIES:  Peripheral pulses 2+.  There is note of a grade 2 pitting  edema bilaterally.  SKIN:  Note of dry skin all over.   LABORATORY DATA:  Hemoglobin 8.1, hematocrit 23.7, WBC 7.3, platelets 372.  Sodium 141, potassium 3.5, chloride 113, BUN 33, creatinine 9.3, glucose  117.  Urinalysis showed WBC too numerous to count, with protein more than  300 and glucose of 100.  ABG showed pH 7.281, CO2 35.9, and bicarbonate  16.9.  Ultrasound of the kidneys showed no hydronephrosis, with normal  kidneys.   ASSESSMENT:  A 40 year old, Afro-American female with type 2 diabetes  mellitus and hypertension.  1.  Chronic renal failure.  The patient has chronic kidney disease, stage      III, with a GFR of 41.67.  Chronic renal failure is probably secondary      to diabetes mellitus.  It is unclear whether there is an acute component      to the renal failure.  The patient has had diarrhea and may have      depletion of intravascular volume.  She also has a history of ACE      inhibitor and nonsteroidal antiinflammatory drug use.  We will hold ACE      inhibitors and NSAIDs.  Renal ultrasound did not show any signs of      obstruction.  Will request for 24-hour urine collection for protein and      creatinine, urine sodium and creatinine, to check for FENa.  2.  Diabetes mellitus.  Patient put on sliding scale insulin.  Suggest check      hemoglobin A1c to determine glycemic control for the past  three months.  3.  Hypertension.  Will monitor blood pressure closely.  May need to restart      antihypertensive medications if blood pressure continues to be elevated.  4.  Urinary tract infection.  Will send specimens for urine cultures.      Patient started on ciprofloxacin 250 mg p.o. b.i.d.  5.  Anemia.  Will check serum iron, TIBC, and ferritin.  This is probably      chronic anemia secondary to renal disease.  Start Aranesp 200 mcg IV      weekly.  6.  FEN.  Patient on ADA and renal diet.       MC/MEDQ  D:  07/15/2004  T:  07/16/2004  Job:  [id number]   cc:   Lorelle Formosa, M.D.  [phone] [address]  [place]  [place] [address]  Fax: [phone]

## 2011-01-16 NOTE — Discharge Summary (Signed)
NAMEMarland Wang  TRAMEKA, DOROUGH NO.:  [id number]   MEDICAL RECORD NO.:  [id number]          PATIENT TYPE:  INP   LOCATION:  5708                         FACILITY:  [facility]   PHYSICIAN:  Isidor Holts, M.D.  DATE OF BIRTH:  27-Dec-1970   DATE OF ADMISSION:  07/15/2004  DATE OF DISCHARGE:  07/19/2004                                 DISCHARGE SUMMARY   PRIMARY CARE PHYSICIAN:  Lorelle Formosa, M.D.; nephrologist, Cecille Aver, M.D.   PRIMARY DIAGNOSES:  1.  Chronic renal failure.  2.  Urinary tract infection.   SECONDARY DIAGNOSES:  1.  Diabetes mellitus.  2.  Hypertension.  3.  Secondary hyperparathyroidism.  4.  Anemia of chronic disease.   DISCHARGE MEDICATIONS:  1.  Labetalol 400 mg p.o. b.i.d.  2.  Calcium carbonate 2 tablets t.i.d. with meals.  3.  Calcitriol 0.5 mcg daily.  4.  Ciprofloxacin 500 mg p.o. b.i.d. for four days only.  5.  Iron sulfate tablets 325 mg p.o. t.i.d.  6.  Lantus insulin 10 units subcut at bedtime.  7.  Lasix 40 mg p.o. daily.  8.  Protonix 40 mg p.o. daily.  9.  Aranesp 200 mcg subcu q.week on Thursday.   CONSULTATIONS:  1.  Quita Wang. Hart Rochester, M.D., [facility].  2.  [name], M.D., nephrology.   IMAGING PROCEDURES:  1.  Renal ultrasound July 15, 2004 that showed normal sized kidneys      without obstruction.  2.  Chest x-ray July 17, 2004, heart was within normal limits, possible      features of right lower lobe airspace process, on PA views only.  This      could be artifact of overlying breast tissue.  Osseous structures are      intact.  3.  Urine cytology July 17, 2004.  This was negative for eosinophils.   HISTORY OF PRESENT ILLNESS:  As in notes of July 15, 2004; however, in  brief, this is a 40 year old, African-American female with a history of  insulin-requiring diabetes mellitus and hypertension as well as renal  insufficiency with a creatinine of 1.8 one year ago, presenting with  abdominal pain, vomiting.  Laboratory evaluation in the emergency room  showed a hemoglobin of 8.1, BUN 33, creatinine 9.3, glucose 117, pH 7.28.  Urinalysis showed a positive urine sediment, consistent with UTI.  She was  admitted for further evaluation, investigation and management.   HOSPITAL COURSE:  1.  Abdominal pain:  Physical examination was quite unremarkable and her      abdominal pain was quite minimal at the time of admission.  She was      managed with proton pump inhibitor treatment with good clinical effect.   1.  Chronic renal failure.  Nephrology, i.e., Dr. Darrick Penna, was called on      consult.  It was determined that this was not acute-on- chronic renal      insufficiency, but was more in the nature of chronic renal failure.  It      was concluded that the patient would be a suitable candidate  for      dialysis, although not emergently.  She therefore had on July 18, 2004 a radiocephalic AV fistula created.  She tolerated the procedure      well and has a follow-up appointment for outpatient nephrology followup      and eventual hemodialysis.   1.  Severe uncontrolled hypertension.  This was deemed secondary to      underlying chronic renal failure.  The patient was managed with      labetalol with satisfactory clinical response.   1.  Anemia.  Hemoglobin had dropped to 6.6 on July 16, 2004.  The      patient was therefore transfused with two units of  PRBC's and as of      July 18, 2004 hemoglobin was 9.8.  The patient's anemia is deemed to      be multifactorial, i.e., anemia of chronic disease likely secondary to      the patient's nephropathy with diabetes mellitus as an etiologic factor.      She was therefore commenced on iron supplements and Aranesp.   1.  Urinary tract infection.  This was treated with ciprofloxacin, a total      course of seven days is planned.   1.  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  Per       nephrology recommendations, the patient is placed now on a combination      of calcium and calcitriol.   1.  Diabetes mellitus.  This was satisfactorily controlled with sliding      scale coverage as well as Lantus while the patient was hospitalized.   DISPOSITION:  The patient was discharged in satisfactory condition on  July 19, 2004.   ACTIVITY:  As tolerated.   DIET:  Renal diet.   FOLLOWUP:  The patient is requested to call Dr. Ronne Binning on July 21, 2004 to make a follow-up appointment to be seen in two weeks.  She  verbalized understanding.  She also has an appointment scheduled with Dr.  Hart Rochester at the CVTS office on September 02, 2004, and also an appointment with  Dr. Kathrene Bongo on August 06, 2004 at 11:45 a.m.  The patient has  verbalized understanding.      Chri   CO/MEDQ  D:  07/20/2004  T:  07/20/2004  Job:  [id number]   cc:   Lorelle Formosa, M.D.  [address]  [place]  [place] [address]  Fax: [phone]   Quita Wang. Hart Rochester, M.D.  [address]  [place]  [place] [address]   Cecille Aver, M.D.  [address]  [place]  [place] [address]  Fax: [phone]   [name], M.D.  [address]  [place]  [place] [address]  Fax: [phone]

## 2011-02-02 ENCOUNTER — Other Ambulatory Visit (HOSPITAL_COMMUNITY): Payer: Self-pay | Admitting: Nephrology

## 2011-02-02 DIAGNOSIS — N186 End stage renal disease: Secondary | ICD-10-CM

## 2011-02-05 ENCOUNTER — Ambulatory Visit (HOSPITAL_COMMUNITY)
Admission: RE | Admit: 2011-02-05 | Discharge: 2011-02-05 | Disposition: A | Discharge status: Home / Self Care | Payer: Medicare Other | Source: Ambulatory Visit | Attending: Nephrology | Admitting: Nephrology

## 2011-02-05 ENCOUNTER — Other Ambulatory Visit (HOSPITAL_COMMUNITY): Payer: Self-pay | Admitting: Nephrology

## 2011-02-05 DIAGNOSIS — N2581 Secondary hyperparathyroidism of renal origin: Secondary | ICD-10-CM | POA: Insufficient documentation

## 2011-02-05 DIAGNOSIS — D649 Anemia, unspecified: Secondary | ICD-10-CM | POA: Insufficient documentation

## 2011-02-05 DIAGNOSIS — Y832 Surgical operation with anastomosis, bypass or graft as the cause of abnormal reaction of the patient, or of later complication, without mention of misadventure at the time of the procedure: Secondary | ICD-10-CM | POA: Insufficient documentation

## 2011-02-05 DIAGNOSIS — N186 End stage renal disease: Secondary | ICD-10-CM | POA: Insufficient documentation

## 2011-02-05 DIAGNOSIS — Z9119 Patient's noncompliance with other medical treatment and regimen: Secondary | ICD-10-CM | POA: Insufficient documentation

## 2011-02-05 DIAGNOSIS — Z794 Long term (current) use of insulin: Secondary | ICD-10-CM | POA: Insufficient documentation

## 2011-02-05 DIAGNOSIS — E119 Type 2 diabetes mellitus without complications: Secondary | ICD-10-CM | POA: Insufficient documentation

## 2011-02-05 DIAGNOSIS — T82898A Other specified complication of vascular prosthetic devices, implants and grafts, initial encounter: Secondary | ICD-10-CM | POA: Insufficient documentation

## 2011-02-05 DIAGNOSIS — Z91199 Patient's noncompliance with other medical treatment and regimen due to unspecified reason: Secondary | ICD-10-CM | POA: Insufficient documentation

## 2011-02-05 DIAGNOSIS — I120 Hypertensive chronic kidney disease with stage 5 chronic kidney disease or end stage renal disease: Secondary | ICD-10-CM | POA: Insufficient documentation

## 2011-02-05 MED ORDER — IOHEXOL 300 MG/ML  SOLN: 50.0000 mL | Freq: Once | INTRAMUSCULAR | Status: AC | PRN

## 2011-03-13 ENCOUNTER — Other Ambulatory Visit (HOSPITAL_COMMUNITY): Payer: Self-pay | Admitting: Nephrology

## 2011-03-13 DIAGNOSIS — N186 End stage renal disease: Secondary | ICD-10-CM

## 2011-03-24 ENCOUNTER — Ambulatory Visit (HOSPITAL_COMMUNITY): Payer: Medicaid Other

## 2011-05-25 LAB — STREP A DNA PROBE: Group A Strep Probe: NEGATIVE

## 2011-06-02 LAB — URINALYSIS, ROUTINE W REFLEX MICROSCOPIC
Glucose, UA: NEGATIVE
Ketones, ur: 15 — AB
Protein, ur: >300 — AB

## 2011-06-02 LAB — CBC
MCV: 87.6
Platelets: 208
Platelets: 214
RBC: 4.08
RDW: 15.5
WBC: 16.4 — ABNORMAL HIGH
WBC: 8.9

## 2011-06-02 LAB — PROTIME-INR
INR: 0.9
INR: 1

## 2011-06-02 LAB — POCT CARDIAC MARKERS: Myoglobin, poc: >500

## 2011-06-02 LAB — LIPID PANEL
HDL: 32 — ABNORMAL LOW
Triglycerides: 159 — ABNORMAL HIGH
VLDL: 32

## 2011-06-02 LAB — URINE MICROSCOPIC-ADD ON

## 2011-06-02 LAB — URINE CULTURE

## 2011-06-02 LAB — MAGNESIUM: Magnesium: 2.2

## 2011-06-02 LAB — CULTURE, BLOOD (ROUTINE X 2): Culture: NO GROWTH

## 2011-06-02 LAB — RAPID URINE DRUG SCREEN, HOSP PERFORMED
Amphetamines: NOT DETECTED
Cocaine: NOT DETECTED
Opiates: NOT DETECTED
Tetrahydrocannabinol: NOT DETECTED

## 2011-06-02 LAB — DIFFERENTIAL
Basophils Absolute: 0.1
Basophils Relative: 0
Eosinophils Absolute: 0.3
Lymphs Abs: 1.7
Neutrophils Relative %: 81 — ABNORMAL HIGH

## 2011-06-02 LAB — RENAL FUNCTION PANEL
Albumin: 2.8 — ABNORMAL LOW
BUN: 15
Calcium: 8.6
Chloride: 97
Creatinine, Ser: 5.33 — ABNORMAL HIGH
GFR calc Af Amer: 11 — ABNORMAL LOW
GFR calc non Af Amer: 9 — ABNORMAL LOW

## 2011-06-02 LAB — COMPREHENSIVE METABOLIC PANEL
AST: 20
Albumin: 3 — ABNORMAL LOW
BUN: 44 — ABNORMAL HIGH
Calcium: 8.8
Creatinine, Ser: 10.13 — ABNORMAL HIGH
GFR calc Af Amer: 5 — ABNORMAL LOW

## 2011-06-02 LAB — GLUCOSE, CAPILLARY
Glucose-Capillary: 132 — ABNORMAL HIGH
Glucose-Capillary: 150 — ABNORMAL HIGH
Glucose-Capillary: 150 — ABNORMAL HIGH
Glucose-Capillary: 164 — ABNORMAL HIGH

## 2011-06-02 LAB — BASIC METABOLIC PANEL
BUN: 40 — ABNORMAL HIGH
Chloride: 100
Creatinine, Ser: 10.33 — ABNORMAL HIGH

## 2011-06-02 LAB — APTT: aPTT: 28

## 2011-06-02 LAB — HEMOGLOBIN A1C
Hgb A1c MFr Bld: 11.3 — ABNORMAL HIGH
Mean Plasma Glucose: 278

## 2011-06-02 LAB — LACTIC ACID, PLASMA: Lactic Acid, Venous: 1

## 2011-06-02 LAB — LIPASE, BLOOD: Lipase: 36

## 2011-06-02 LAB — PHOSPHORUS: Phosphorus: 5 — ABNORMAL HIGH

## 2011-06-02 LAB — ETHANOL: Alcohol, Ethyl (B): <5

## 2011-06-03 LAB — CBC
HCT: 26.4 — ABNORMAL LOW
HCT: 29.1 — ABNORMAL LOW
HCT: 33.7 — ABNORMAL LOW
Hemoglobin: 9.1 — ABNORMAL LOW
Hemoglobin: 9.5 — ABNORMAL LOW
MCHC: 34.5
MCHC: 35
MCV: 85.1
MCV: 85.6
MCV: 87.1
MCV: 87.4
Platelets: 218
Platelets: 232
RBC: 3.08 — ABNORMAL LOW
RBC: 3.18 — ABNORMAL LOW
RBC: 3.33 — ABNORMAL LOW
RDW: 15.4
WBC: 12.5 — ABNORMAL HIGH
WBC: 16.9 — ABNORMAL HIGH
WBC: 23 — ABNORMAL HIGH
WBC: 25.1 — ABNORMAL HIGH

## 2011-06-03 LAB — GLUCOSE, CAPILLARY
Glucose-Capillary: 112 — ABNORMAL HIGH
Glucose-Capillary: 115 — ABNORMAL HIGH
Glucose-Capillary: 121 — ABNORMAL HIGH
Glucose-Capillary: 137 — ABNORMAL HIGH
Glucose-Capillary: 139 — ABNORMAL HIGH
Glucose-Capillary: 140 — ABNORMAL HIGH
Glucose-Capillary: 143 — ABNORMAL HIGH
Glucose-Capillary: 145 — ABNORMAL HIGH
Glucose-Capillary: 180 — ABNORMAL HIGH
Glucose-Capillary: 306 — ABNORMAL HIGH
Glucose-Capillary: 414 — ABNORMAL HIGH
Glucose-Capillary: 74

## 2011-06-03 LAB — RENAL FUNCTION PANEL
BUN: 52 — ABNORMAL HIGH
CO2: 21
CO2: 23
Calcium: 7.9 — ABNORMAL LOW
Calcium: 8.3 — ABNORMAL LOW
Chloride: 93 — ABNORMAL LOW
Chloride: 95 — ABNORMAL LOW
Creatinine, Ser: 10.04 — ABNORMAL HIGH
Creatinine, Ser: 12.24 — ABNORMAL HIGH
GFR calc Af Amer: 5 — ABNORMAL LOW
Glucose, Bld: 122 — ABNORMAL HIGH
Glucose, Bld: 137 — ABNORMAL HIGH
Sodium: 129 — ABNORMAL LOW

## 2011-06-03 LAB — CULTURE, BLOOD (ROUTINE X 2)

## 2011-06-03 LAB — PHOSPHORUS: Phosphorus: 5.5 — ABNORMAL HIGH

## 2011-06-03 LAB — POCT I-STAT 3, VENOUS BLOOD GAS (G3P V)
Acid-Base Excess: 1
pCO2, Ven: 48.8
pH, Ven: 7.349 — ABNORMAL HIGH
pO2, Ven: 40

## 2011-06-03 LAB — COMPREHENSIVE METABOLIC PANEL
AST: 26
Albumin: 3.2 — ABNORMAL LOW
BUN: 39 — ABNORMAL HIGH
BUN: 56 — ABNORMAL HIGH
CO2: 19
Chloride: 93 — ABNORMAL LOW
Creatinine, Ser: 10.78 — ABNORMAL HIGH
Creatinine, Ser: 8.78 — ABNORMAL HIGH
GFR calc Af Amer: 6 — ABNORMAL LOW
GFR calc non Af Amer: 4 — ABNORMAL LOW
Glucose, Bld: 209 — ABNORMAL HIGH
Total Bilirubin: 1.1
Total Protein: 7.6

## 2011-06-03 LAB — BASIC METABOLIC PANEL
CO2: 22
CO2: 23
Calcium: 8.5
Calcium: 8.9
Creatinine, Ser: 8.97 — ABNORMAL HIGH
GFR calc Af Amer: 6 — ABNORMAL LOW
GFR calc Af Amer: 6 — ABNORMAL LOW
GFR calc non Af Amer: 5 — ABNORMAL LOW
Potassium: 3.5
Sodium: 128 — ABNORMAL LOW

## 2011-06-03 LAB — DIFFERENTIAL
Basophils Relative: 0
Eosinophils Relative: 0
Lymphocytes Relative: 3 — ABNORMAL LOW
Lymphs Abs: 0.8
Monocytes Relative: 6
Neutro Abs: 22.8 — ABNORMAL HIGH

## 2011-06-03 LAB — HEMOGLOBIN A1C
Hgb A1c MFr Bld: 11.1 — ABNORMAL HIGH
Mean Plasma Glucose: 272

## 2011-06-03 LAB — ANAEROBIC CULTURE

## 2011-06-03 LAB — CULTURE, ROUTINE-ABSCESS

## 2011-06-03 LAB — KETONES, QUALITATIVE: Acetone, Bld: NEGATIVE

## 2011-06-03 LAB — PROTIME-INR: INR: 1.1

## 2011-06-08 ENCOUNTER — Other Ambulatory Visit (HOSPITAL_COMMUNITY): Payer: Self-pay | Admitting: Nephrology

## 2011-06-08 DIAGNOSIS — N186 End stage renal disease: Secondary | ICD-10-CM

## 2011-06-12 LAB — I-STAT 8, (EC8 V) (CONVERTED LAB)
BUN: 9
Bicarbonate: 33.7 — ABNORMAL HIGH
Glucose, Bld: 353 — ABNORMAL HIGH
HCT: 62 — ABNORMAL HIGH
Hemoglobin: 21.1 — ABNORMAL HIGH
Operator id: 285491
Sodium: 134 — ABNORMAL LOW
TCO2: 36
pCO2, Ven: 61.6 — ABNORMAL HIGH

## 2011-06-12 LAB — PROTIME-INR
INR: 0.9
Prothrombin Time: 12.5

## 2011-06-12 LAB — CBC
MCHC: 32.9
MCV: 85.8
RBC: 6.49 — ABNORMAL HIGH
RDW: 16.6 — ABNORMAL HIGH

## 2011-06-12 LAB — POCT I-STAT 4, (NA,K, GLUC, HGB,HCT)
Glucose, Bld: 272 — ABNORMAL HIGH
HCT: 48 — ABNORMAL HIGH
Operator id: 206361
Sodium: 132 — ABNORMAL LOW

## 2011-06-12 LAB — APTT: aPTT: 27

## 2011-06-15 LAB — I-STAT 8, (EC8 V) (CONVERTED LAB)
Acid-Base Excess: 5 — ABNORMAL HIGH
Bicarbonate: 30.8 — ABNORMAL HIGH
HCT: 58 — ABNORMAL HIGH
Hemoglobin: 19.7 — ABNORMAL HIGH
Operator id: 279831
Potassium: 3.8
Sodium: 135
TCO2: 32

## 2011-06-16 LAB — URINALYSIS, ROUTINE W REFLEX MICROSCOPIC
Bilirubin Urine: NEGATIVE
Glucose, UA: >1000 — AB
Ketones, ur: NEGATIVE
Nitrite: NEGATIVE
Specific Gravity, Urine: 1.02
pH: 7.5

## 2011-06-16 LAB — URINE MICROSCOPIC-ADD ON

## 2011-06-16 LAB — URINE CULTURE

## 2011-06-23 ENCOUNTER — Other Ambulatory Visit (HOSPITAL_COMMUNITY): Payer: Self-pay | Admitting: Nephrology

## 2011-06-23 ENCOUNTER — Ambulatory Visit (HOSPITAL_COMMUNITY)
Admission: RE | Admit: 2011-06-23 | Discharge: 2011-06-23 | Disposition: A | Discharge status: Home / Self Care | Payer: Medicare Other | Source: Ambulatory Visit | Attending: Nephrology | Admitting: Nephrology

## 2011-06-23 DIAGNOSIS — T82898A Other specified complication of vascular prosthetic devices, implants and grafts, initial encounter: Secondary | ICD-10-CM | POA: Insufficient documentation

## 2011-06-23 DIAGNOSIS — D649 Anemia, unspecified: Secondary | ICD-10-CM | POA: Insufficient documentation

## 2011-06-23 DIAGNOSIS — E119 Type 2 diabetes mellitus without complications: Secondary | ICD-10-CM | POA: Insufficient documentation

## 2011-06-23 DIAGNOSIS — Y832 Surgical operation with anastomosis, bypass or graft as the cause of abnormal reaction of the patient, or of later complication, without mention of misadventure at the time of the procedure: Secondary | ICD-10-CM | POA: Insufficient documentation

## 2011-06-23 DIAGNOSIS — I120 Hypertensive chronic kidney disease with stage 5 chronic kidney disease or end stage renal disease: Secondary | ICD-10-CM | POA: Insufficient documentation

## 2011-06-23 DIAGNOSIS — N186 End stage renal disease: Secondary | ICD-10-CM

## 2011-06-23 DIAGNOSIS — N2581 Secondary hyperparathyroidism of renal origin: Secondary | ICD-10-CM | POA: Insufficient documentation

## 2011-06-23 MED ORDER — IOHEXOL 300 MG/ML  SOLN
150.0000 mL | Freq: Once | INTRAMUSCULAR | Status: AC | PRN
  Administered 2011-06-23: 70 mL via INTRAVENOUS

## 2011-09-11 ENCOUNTER — Other Ambulatory Visit (HOSPITAL_COMMUNITY): Payer: Self-pay | Admitting: Nephrology

## 2011-09-11 DIAGNOSIS — N186 End stage renal disease: Secondary | ICD-10-CM

## 2011-09-22 ENCOUNTER — Ambulatory Visit (HOSPITAL_COMMUNITY): Admission: RE | Admit: 2011-09-22 | Payer: Medicare Other | Source: Ambulatory Visit

## 2011-09-29 ENCOUNTER — Ambulatory Visit (HOSPITAL_COMMUNITY)
Admission: RE | Admit: 2011-09-29 | Discharge: 2011-09-29 | Disposition: A | Discharge status: Home / Self Care | Payer: Medicare Other | Source: Ambulatory Visit | Attending: Nephrology | Admitting: Nephrology

## 2011-09-29 ENCOUNTER — Other Ambulatory Visit (HOSPITAL_COMMUNITY): Payer: Self-pay | Admitting: Nephrology

## 2011-09-29 DIAGNOSIS — Y832 Surgical operation with anastomosis, bypass or graft as the cause of abnormal reaction of the patient, or of later complication, without mention of misadventure at the time of the procedure: Secondary | ICD-10-CM | POA: Insufficient documentation

## 2011-09-29 DIAGNOSIS — N186 End stage renal disease: Secondary | ICD-10-CM

## 2011-09-29 DIAGNOSIS — I871 Compression of vein: Secondary | ICD-10-CM | POA: Insufficient documentation

## 2011-09-29 DIAGNOSIS — T82898A Other specified complication of vascular prosthetic devices, implants and grafts, initial encounter: Secondary | ICD-10-CM | POA: Insufficient documentation

## 2011-09-29 MED ORDER — IOHEXOL 300 MG/ML  SOLN
100.0000 mL | Freq: Once | INTRAMUSCULAR | Status: AC | PRN
  Administered 2011-09-29: 60 mL via INTRAVENOUS

## 2011-09-29 NOTE — H&P (Signed)
09/29/11: patient for left leg AVG angioplasty and possible stent placement.  Patient familiar with procedure, written consent obtained.  Lungs : CTA bilaterally, CV: distant heart sounds but RRR heard. Airway assessment is a 2. Patient ready for procedure as no labs needed.

## 2011-09-29 NOTE — Procedures (Signed)
Shuntogram and 7mm venous PTA No complication No blood loss. See complete dictation in [facility] PACS.  

## 2011-10-01 ENCOUNTER — Telehealth (HOSPITAL_COMMUNITY): Payer: Self-pay

## 2011-11-05 ENCOUNTER — Other Ambulatory Visit: Payer: Self-pay | Admitting: *Deleted

## 2011-11-11 ENCOUNTER — Encounter (HOSPITAL_COMMUNITY): Payer: Self-pay | Admitting: Pharmacy Technician

## 2011-11-11 MED ORDER — SODIUM CHLORIDE 0.9 % IJ SOLN: 3.0000 mL | INTRAMUSCULAR | Status: DC | PRN

## 2011-11-12 ENCOUNTER — Ambulatory Visit (HOSPITAL_COMMUNITY)
Admission: RE | Admit: 2011-11-12 | Discharge: 2011-11-12 | Disposition: A | Discharge status: Home / Self Care | Payer: Medicare Other | Source: Ambulatory Visit | Attending: Vascular Surgery | Admitting: Vascular Surgery

## 2011-11-12 ENCOUNTER — Encounter (HOSPITAL_COMMUNITY)
Admission: RE | Disposition: A | Discharge status: Home / Self Care | Payer: Self-pay | Source: Ambulatory Visit | Attending: Vascular Surgery

## 2011-11-12 DIAGNOSIS — T82898A Other specified complication of vascular prosthetic devices, implants and grafts, initial encounter: Secondary | ICD-10-CM | POA: Insufficient documentation

## 2011-11-12 DIAGNOSIS — Y832 Surgical operation with anastomosis, bypass or graft as the cause of abnormal reaction of the patient, or of later complication, without mention of misadventure at the time of the procedure: Secondary | ICD-10-CM | POA: Insufficient documentation

## 2011-11-12 DIAGNOSIS — K219 Gastro-esophageal reflux disease without esophagitis: Secondary | ICD-10-CM | POA: Insufficient documentation

## 2011-11-12 DIAGNOSIS — T82598A Other mechanical complication of other cardiac and vascular devices and implants, initial encounter: Secondary | ICD-10-CM | POA: Insufficient documentation

## 2011-11-12 DIAGNOSIS — N2581 Secondary hyperparathyroidism of renal origin: Secondary | ICD-10-CM | POA: Insufficient documentation

## 2011-11-12 DIAGNOSIS — I871 Compression of vein: Secondary | ICD-10-CM | POA: Insufficient documentation

## 2011-11-12 DIAGNOSIS — D638 Anemia in other chronic diseases classified elsewhere: Secondary | ICD-10-CM | POA: Insufficient documentation

## 2011-11-12 DIAGNOSIS — N186 End stage renal disease: Secondary | ICD-10-CM | POA: Insufficient documentation

## 2011-11-12 DIAGNOSIS — Z992 Dependence on renal dialysis: Secondary | ICD-10-CM | POA: Insufficient documentation

## 2011-11-12 DIAGNOSIS — E119 Type 2 diabetes mellitus without complications: Secondary | ICD-10-CM | POA: Insufficient documentation

## 2011-11-12 DIAGNOSIS — I120 Hypertensive chronic kidney disease with stage 5 chronic kidney disease or end stage renal disease: Secondary | ICD-10-CM | POA: Insufficient documentation

## 2011-11-12 HISTORY — PX: SHUNTOGRAM: SHX5491

## 2011-11-12 SURGERY — ASSESSMENT, SHUNT FUNCTION, WITH CONTRAST RADIOGRAPHIC STUDY
Anesthesia: LOCAL

## 2011-11-12 MED ORDER — INSULIN REGULAR HUMAN 100 UNIT/ML IJ SOLN: 6.0000 [IU] | Freq: Once | INTRAMUSCULAR | Status: DC

## 2011-11-12 MED ORDER — HEPARIN SODIUM (PORCINE) 1000 UNIT/ML IJ SOLN
INTRAMUSCULAR | Status: AC
  Filled 2011-11-12: qty 1

## 2011-11-12 MED ORDER — HEPARIN (PORCINE) IN NACL 2-0.9 UNIT/ML-% IJ SOLN
INTRAMUSCULAR | Status: AC
  Filled 2011-11-12: qty 1000

## 2011-11-12 MED ORDER — LIDOCAINE HCL (PF) 1 % IJ SOLN
INTRAMUSCULAR | Status: AC
  Filled 2011-11-12: qty 30

## 2011-11-12 MED ORDER — INSULIN ASPART 100 UNIT/ML ~~LOC~~ SOLN
6.0000 [IU] | Freq: Once | SUBCUTANEOUS | Status: AC
  Administered 2011-11-12: 6 [IU] via SUBCUTANEOUS
  Filled 2011-11-12: qty 0.06

## 2011-11-12 NOTE — Op Note (Addendum)
OPERATIVE NOTE   PROCEDURE: 1. Left thigh arteriovenous graft cannulation under ultrasound guidance 2. Left thigh shuntogram 3. Percutaneous thrombectomy of left thigh arteriovenous graft via balloon maceration 4. Venoplasty of common femoral vein x 2 (6 mm x 40 mm, 8 mm x 20 mm)  PRE-OPERATIVE DIAGNOSIS: Malfunctioning left arteriovenous thigh  POST-OPERATIVE DIAGNOSIS: same as above   SURGEON: Leonides Sake, MD  ANESTHESIA: local  ESTIMATED BLOOD LOSS: 5 cc  FINDING(S): 1. 50% stenosis due to sclerotic valve just proximal to arteriovenous graft venous anastomosis: near resolved after intervention 2. Thrombus in the arterial cannulation segment: near resolved after balloon maceration with angioplasty 3.   Widely patent arterial anastomosis without evidence of arterial steal  SPECIMEN(S):  None  CONTRAST: 35 cc  INDICATIONS: [name] is a 41 y.o. female who  presents with malfunctioning left thigh arteriovenous graft.  The patient is scheduled for left thigh shuntogram and possible intervention.  The patient is aware the risks include but are not limited to: bleeding, infection, thrombosis of the cannulated access, and possible anaphylactic reaction to the contrast.  The patient is aware of the risks of the procedure and elects to proceed forward.  DESCRIPTION: After full informed written consent was obtained, the patient was brought back to the angiography suite and placed supine upon the angiography table.  The patient was connected to monitoring equipment.  The left thigh was prepped and draped in the standard fashion for a percutaneous access intervention.  Under ultrasound guidance, the left thigh arteriovenous graft was cannulated with a micropuncture needle.  The microwire was advanced into the fistula and the needle was exchanged for the a microsheath, which was lodged 2 cm into the access.  The wire was removed and the sheath was connected to the IV extension tubing.   Hand injections were completed to image the thigh arteriovenous graft.  Imaging demonstrated at stenosis near the confluence with the common femoral vein and thrombus in the segment of the arteriovenous graft being cannulated on this arterial side of the arteriovenous graft.  Consequently, I elected to sew a new purse string around the sheath and pull the sheath while tying down the suture.  I then cannulated the arteriovenous graft more proximally on the arterial limb of the arteriovenous graft, in a similar fashion as before.  I placed a Rosen wire through the sheath and exchanged the microsheath for a short 6-Fr sheath.  Based on the the imaging, a 6 mm x 40 mm angioplasty balloon was selected.  The balloon was centered around the venous confluence and inflated to 20 atm for 1 minute(s).  I also placed the balloon at the segment of thrombus and inflated the balloon to profile for 30 sec to macerate the thrombus.  While the balloon was inflated, I did an injection to image the arterial anastomosis.   The arterial anastomosis was widely patent without evidence of steal present.  I deflated the balloon and removed the balloon.  I did another injection, a >30% residual stenosis was present at the stenotic valve and near resolution of the previously thrombotic segment.  I placed a 8 mm x 20 mm angioplasty balloon back at the stenotic valve and inflated it to 15 atm for 1 minute.  The balloon was deflated and removed.  The completion imaging demonstrated near resolution of stenosis at valve.  Based on the completion imaging, no further intervention is necessary.  The wire and balloon were removed from the sheath.  A 4-0 Monocryl  purse-string suture was sewn around the sheath.  The sheath was removed while tying down the suture.  A sterile bandage was applied to the puncture site.  COMPLICATIONS: none  CONDITION: stable  Leonides Sake, MD [facility] Office: [phone] Pager:  [phone]  11/12/2011 10:14 AM

## 2011-11-12 NOTE — Progress Notes (Signed)
CBG 325 post 6 units novolog insulin

## 2011-11-12 NOTE — H&P (Addendum)
  [facility]  Brief Access History and Physical  History of Present Illness  [name] is a 41 y.o. female who presents with chief complaint: malfunctioning L thigh graft.  The patient presents today for L thigh shuntogram, possible intervention.    MEDICAL HISTORY:  1. History of gangrene/osteomyelitis of the left foot.  a. Status post partial calcaneal excision in left foot by Dr.  Lajoyce Corners on March 04, 2009.  2. Diabetes mellitus - type 2.  3. Hypertension.  4. End-stage renal disease on Monday, Wednesday, Friday at [facility].  5. Secondary hyperparathyroidism.  6. 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  7. Anemia of chronic disease.  8. Leukocytosis this admission.  9. History of gastroesophageal reflux disorder.  10.History of diarrhea - resolved.  11.History of right groin arteriovenous graft infection with  methicillin-resistant Staphylococcus aureus and Escherichia coli.  SURGICAL HISTORY 1. Multiple access procedures  SOCIAL HISTORY: The patient is a nonsmoker and nondrinker. The patient  previously lived at home with her family, although may need skilled  nursing facility in the future.   FAMILY HISTORY: Mother with a history of diabetes mellitus.  No current facility-administered medications on file prior to encounter.   No current outpatient prescriptions on file prior to encounter.    Allergies  Allergen Reactions  . Flagyl (Metronidazole Hcl)     Unknown  . Percocet (Oxycodone-Acetaminophen) Other (See Comments)    GI upset   Review of Systems: Kidney Disease, As listed above, otherwise negative.  Physical Examination  Filed Vitals:   11/12/11 0722  BP: 188/99  Pulse: 91  Temp: 98.2 F (36.8 C)  TempSrc: Oral  Resp: 18  Weight: 200 lb (90.719 kg)  SpO2: 95%   There is no height on file to calculate BMI.  General: A&O x 3, WDWN  Pulmonary: Sym exp, good air movt,  CTAB, no rales, rhonchi, & wheezing  Cardiac: RRR, Nl S1, S2, no Murmurs, rubs or gallops  Gastrointestinal: soft, NTND, -G/R, - HSM, - masses, - CVAT B  Musculoskeletal: M/S 5/5 throughout , Extremities without ischemic changes , B AKA, L thigh AVG w/ thrill and bruit  Laboratory See [facility]  Medical Decision Making  [name] is a 41 y.o. female who presents with: malfunctioning Left thigh AVG.   The patient is scheduled for: Right thigh shuntogram, possible intervention. I discussed with the patient the nature of angiographic procedures, especially the limited patencies of any endovascular intervention.  The patient is aware of that the risks of an angiographic procedure include but are not limited to: bleeding, infection, access site complications, renal failure, embolization, rupture of vessel, dissection, possible need for emergent surgical intervention, possible need for surgical procedures to treat the patient's pathology, and stroke and death.    The patient is aware of the risks and agrees to proceed.  Leonides Sake, MD [facility] Office: [phone] Pager: [phone]  11/12/2011, 7:08 AM

## 2011-11-13 LAB — POCT I-STAT, CHEM 8
BUN: 21 mg/dL (ref 6–23)
Chloride: 96 mEq/L (ref 96–112)
Creatinine, Ser: 4.3 mg/dL — ABNORMAL HIGH (ref 0.50–1.10)
Glucose, Bld: 372 mg/dL — ABNORMAL HIGH (ref 70–99)
HCT: 39 % (ref 36.0–46.0)
Potassium: 4.3 mEq/L (ref 3.5–5.1)

## 2011-12-10 ENCOUNTER — Encounter: Payer: Self-pay | Admitting: Vascular Surgery

## 2011-12-11 ENCOUNTER — Ambulatory Visit (INDEPENDENT_AMBULATORY_CARE_PROVIDER_SITE_OTHER): Payer: Medicare Other | Admitting: Vascular Surgery

## 2011-12-11 ENCOUNTER — Ambulatory Visit: Payer: Medicare Other | Admitting: Vascular Surgery

## 2011-12-11 ENCOUNTER — Encounter: Payer: Self-pay | Admitting: Vascular Surgery

## 2011-12-11 VITALS — BP 128/71 | HR 97 | Temp 99.0°F | Ht 63.0 in | Wt 191.8 lb

## 2011-12-11 DIAGNOSIS — N186 End stage renal disease: Secondary | ICD-10-CM

## 2011-12-11 NOTE — Progress Notes (Signed)
[facility]   Established Dialysis Access  History of Present Illness  [name] is a 41 y.o. (1970/12/30) female who presents for re-evaluation for permanent access.  Previous access procedures have been completed in the B arms and B legs.  She currently dialyzes from L thigh AVG.  The patient's complication from previous access procedures include: thrombosis.  The patient has never had a previous PPM placed. Pt s/p L thigh AVG shuntogram and venoplasty (11/12/11).  Past Medical History, Past Surgical History, Social History, Family History, Medications, Allergies, and Review of Systems are unchanged from previous visit on 11/12/11.  Physical Examination  Filed Vitals:   12/11/11 1438  BP: 128/71  Pulse: 97  Temp: 99 F (37.2 C)  TempSrc: Oral  Height: 5\' 3"  (1.6 m)  Weight: 191 lb 12.8 oz (87 kg)   Body mass index is 33.98 kg/(m^2).  General: A&O x 3, WDWN  Pulmonary: Sym exp, good air movt, CTAB, no rales, rhonchi, & wheezing  Cardiac: RRR, Nl S1, S2, no Murmurs, rubs or gallops  Gastrointestinal: soft, NTND, -G/R, - HSM, - masses, - CVAT B  Musculoskeletal: M/S 5/5 throughout , B AKA  Neurologic: Pain and light touch intact in extremities , Motor exam as listed above  Medical Decision Making  [name] is a 41 y.o. female who presents with  ESRD requiring hemodialysis.   As this is this patient's last surgery, I would aggressively pursue surveillance, in 3 month she will get an access duplex on the L thigh AVG  The patient has agreed with surveillance and will follow up with [place] at that time  Thank you for allowing [place] to participate in this patient's care.  Leonides Sake, MD [facility] [place] Office: [phone] Pager: [phone]  12/11/2011, 3:06 PM

## 2012-01-22 ENCOUNTER — Encounter (HOSPITAL_BASED_OUTPATIENT_CLINIC_OR_DEPARTMENT_OTHER): Payer: Self-pay | Admitting: *Deleted

## 2012-01-22 ENCOUNTER — Other Ambulatory Visit: Payer: Self-pay | Admitting: Orthopedic Surgery

## 2012-01-22 ENCOUNTER — Encounter (HOSPITAL_BASED_OUTPATIENT_CLINIC_OR_DEPARTMENT_OTHER)
Admission: RE | Admit: 2012-01-22 | Discharge: 2012-01-22 | Disposition: A | Discharge status: Home / Self Care | Payer: Medicare Other | Source: Ambulatory Visit | Attending: Orthopedic Surgery | Admitting: Orthopedic Surgery

## 2012-01-22 LAB — BASIC METABOLIC PANEL
BUN: 11 mg/dL (ref 6–23)
CO2: 27 mEq/L (ref 19–32)
Calcium: 9.9 mg/dL (ref 8.4–10.5)
Chloride: 91 mEq/L — ABNORMAL LOW (ref 96–112)
Creatinine, Ser: 3.06 mg/dL — ABNORMAL HIGH (ref 0.50–1.10)
Glucose, Bld: 420 mg/dL — ABNORMAL HIGH (ref 70–99)

## 2012-01-22 NOTE — Pre-Procedure Instructions (Addendum)
To come for BMET and EKG  Pt's hx. discussed with Dr. Gelene Mink, pt. OK to come for surgery

## 2012-01-26 ENCOUNTER — Encounter (HOSPITAL_BASED_OUTPATIENT_CLINIC_OR_DEPARTMENT_OTHER): Payer: Self-pay | Admitting: Anesthesiology

## 2012-01-26 ENCOUNTER — Ambulatory Visit (HOSPITAL_BASED_OUTPATIENT_CLINIC_OR_DEPARTMENT_OTHER)
Admission: RE | Admit: 2012-01-26 | Discharge: 2012-01-26 | Disposition: A | Discharge status: Home / Self Care | Payer: Medicare Other | Source: Ambulatory Visit | Attending: Orthopedic Surgery | Admitting: Orthopedic Surgery

## 2012-01-26 ENCOUNTER — Encounter (HOSPITAL_BASED_OUTPATIENT_CLINIC_OR_DEPARTMENT_OTHER): Payer: Self-pay | Admitting: *Deleted

## 2012-01-26 ENCOUNTER — Ambulatory Visit (HOSPITAL_BASED_OUTPATIENT_CLINIC_OR_DEPARTMENT_OTHER): Payer: Medicare Other | Admitting: Anesthesiology

## 2012-01-26 ENCOUNTER — Encounter (HOSPITAL_BASED_OUTPATIENT_CLINIC_OR_DEPARTMENT_OTHER)
Admission: RE | Disposition: A | Discharge status: Home / Self Care | Payer: Self-pay | Source: Ambulatory Visit | Attending: Orthopedic Surgery

## 2012-01-26 ENCOUNTER — Encounter (HOSPITAL_BASED_OUTPATIENT_CLINIC_OR_DEPARTMENT_OTHER): Payer: Self-pay | Admitting: Orthopedic Surgery

## 2012-01-26 DIAGNOSIS — K219 Gastro-esophageal reflux disease without esophagitis: Secondary | ICD-10-CM | POA: Insufficient documentation

## 2012-01-26 DIAGNOSIS — N186 End stage renal disease: Secondary | ICD-10-CM | POA: Insufficient documentation

## 2012-01-26 DIAGNOSIS — G5600 Carpal tunnel syndrome, unspecified upper limb: Secondary | ICD-10-CM | POA: Insufficient documentation

## 2012-01-26 DIAGNOSIS — I120 Hypertensive chronic kidney disease with stage 5 chronic kidney disease or end stage renal disease: Secondary | ICD-10-CM | POA: Insufficient documentation

## 2012-01-26 DIAGNOSIS — Z794 Long term (current) use of insulin: Secondary | ICD-10-CM | POA: Insufficient documentation

## 2012-01-26 DIAGNOSIS — Z8614 Personal history of Methicillin resistant Staphylococcus aureus infection: Secondary | ICD-10-CM | POA: Insufficient documentation

## 2012-01-26 DIAGNOSIS — Z01812 Encounter for preprocedural laboratory examination: Secondary | ICD-10-CM | POA: Insufficient documentation

## 2012-01-26 DIAGNOSIS — Z01810 Encounter for preprocedural cardiovascular examination: Secondary | ICD-10-CM | POA: Insufficient documentation

## 2012-01-26 DIAGNOSIS — E119 Type 2 diabetes mellitus without complications: Secondary | ICD-10-CM | POA: Insufficient documentation

## 2012-01-26 DIAGNOSIS — Z992 Dependence on renal dialysis: Secondary | ICD-10-CM | POA: Insufficient documentation

## 2012-01-26 HISTORY — DX: Personal history of other diseases of the circulatory system: Z86.79

## 2012-01-26 HISTORY — DX: Essential (primary) hypertension: I10

## 2012-01-26 HISTORY — DX: Gastro-esophageal reflux disease without esophagitis: K21.9

## 2012-01-26 HISTORY — DX: Personal history of diseases of the skin and subcutaneous tissue: Z87.2

## 2012-01-26 HISTORY — DX: End stage renal disease: N18.6

## 2012-01-26 HISTORY — DX: Personal history of Methicillin resistant Staphylococcus aureus infection: Z86.14

## 2012-01-26 HISTORY — PX: CARPAL TUNNEL RELEASE: SHX101

## 2012-01-26 LAB — GLUCOSE, CAPILLARY: Glucose-Capillary: 293 mg/dL — ABNORMAL HIGH (ref 70–99)

## 2012-01-26 LAB — POCT I-STAT, CHEM 8
BUN: 27 mg/dL — ABNORMAL HIGH (ref 6–23)
Chloride: 101 mEq/L (ref 96–112)
Creatinine, Ser: 4.8 mg/dL — ABNORMAL HIGH (ref 0.50–1.10)
Sodium: 139 mEq/L (ref 135–145)
TCO2: 30 mmol/L (ref 0–100)

## 2012-01-26 SURGERY — CARPAL TUNNEL RELEASE
Anesthesia: Regional | Site: Wrist | Laterality: Left | Wound class: Clean

## 2012-01-26 MED ORDER — PROPOFOL 10 MG/ML IV EMUL
INTRAVENOUS | Status: DC | PRN
  Administered 2012-01-26: 50 ug/kg/min via INTRAVENOUS

## 2012-01-26 MED ORDER — SODIUM CHLORIDE 0.9 % IV SOLN
INTRAVENOUS | Status: DC
  Administered 2012-01-26 (×2): via INTRAVENOUS

## 2012-01-26 MED ORDER — DROPERIDOL 2.5 MG/ML IJ SOLN: 0.6250 mg | INTRAMUSCULAR | Status: DC | PRN

## 2012-01-26 MED ORDER — LIDOCAINE HCL (PF) 1 % IJ SOLN
INTRAMUSCULAR | Status: DC | PRN
  Administered 2012-01-26: 3.25 mL

## 2012-01-26 MED ORDER — HYDROMORPHONE HCL PF 1 MG/ML IJ SOLN: 0.2500 mg | INTRAMUSCULAR | Status: DC | PRN

## 2012-01-26 MED ORDER — CHLORHEXIDINE GLUCONATE 4 % EX LIQD: 60.0000 mL | Freq: Once | CUTANEOUS | Status: DC

## 2012-01-26 MED ORDER — CEFAZOLIN SODIUM 1-5 GM-% IV SOLN
INTRAVENOUS | Status: DC | PRN
  Administered 2012-01-26: 2 g via INTRAVENOUS

## 2012-01-26 MED ORDER — HYDROCODONE-ACETAMINOPHEN 5-500 MG PO TABS: 1.0000 | ORAL_TABLET | Freq: Four times a day (QID) | ORAL | Status: AC | PRN

## 2012-01-26 MED ORDER — BUPIVACAINE HCL (PF) 0.25 % IJ SOLN
INTRAMUSCULAR | Status: DC | PRN
  Administered 2012-01-26: 3.25 mL

## 2012-01-26 MED ORDER — INSULIN REGULAR HUMAN 100 UNIT/ML IJ SOLN
10.0000 [IU] | Freq: Once | INTRAMUSCULAR | Status: AC
  Administered 2012-01-26: 10 [IU] via SUBCUTANEOUS

## 2012-01-26 MED ORDER — FENTANYL CITRATE 0.05 MG/ML IJ SOLN
INTRAMUSCULAR | Status: DC | PRN
  Administered 2012-01-26 (×2): 50 ug via INTRAVENOUS

## 2012-01-26 MED ORDER — LIDOCAINE HCL (CARDIAC) 20 MG/ML IV SOLN
INTRAVENOUS | Status: DC | PRN
  Administered 2012-01-26: 30 mg via INTRAVENOUS

## 2012-01-26 SURGICAL SUPPLY — 35 items
BANDAGE GAUZE ELAST BULKY 4 IN (GAUZE/BANDAGES/DRESSINGS) ×2 IMPLANT
BLADE SURG 15 STRL LF DISP TIS (BLADE) ×1 IMPLANT
BLADE SURG 15 STRL SS (BLADE) ×2
BNDG CMPR 9X4 STRL LF SNTH (GAUZE/BANDAGES/DRESSINGS) ×1
BNDG COHESIVE 3X5 TAN STRL LF (GAUZE/BANDAGES/DRESSINGS) ×2 IMPLANT
BNDG ESMARK 4X9 LF (GAUZE/BANDAGES/DRESSINGS) ×1 IMPLANT
CHLORAPREP W/TINT 26ML (MISCELLANEOUS) ×2 IMPLANT
CLOTH BEACON ORANGE TIMEOUT ST (SAFETY) ×2 IMPLANT
CORDS BIPOLAR (ELECTRODE) ×2 IMPLANT
COVER MAYO STAND STRL (DRAPES) ×2 IMPLANT
COVER TABLE BACK 60X90 (DRAPES) ×2 IMPLANT
CUFF TOURNIQUET SINGLE 18IN (TOURNIQUET CUFF) ×2 IMPLANT
DRAPE EXTREMITY T 121X128X90 (DRAPE) ×2 IMPLANT
DRAPE SURG 17X23 STRL (DRAPES) ×2 IMPLANT
DRSG KUZMA FLUFF (GAUZE/BANDAGES/DRESSINGS) ×2 IMPLANT
GAUZE XEROFORM 1X8 LF (GAUZE/BANDAGES/DRESSINGS) ×2 IMPLANT
GLOVE BIO SURGEON STRL SZ 6.5 (GLOVE) ×1 IMPLANT
GLOVE SURG ORTHO 8.0 STRL STRW (GLOVE) ×3 IMPLANT
GOWN BRE IMP PREV XXLGXLNG (GOWN DISPOSABLE) ×1 IMPLANT
GOWN PREVENTION PLUS XLARGE (GOWN DISPOSABLE) ×1 IMPLANT
NEEDLE 27GAX1X1/2 (NEEDLE) ×1 IMPLANT
NS IRRIG 1000ML POUR BTL (IV SOLUTION) ×2 IMPLANT
PACK BASIN DAY SURGERY FS (CUSTOM PROCEDURE TRAY) ×2 IMPLANT
PAD CAST 3X4 CTTN HI CHSV (CAST SUPPLIES) ×1 IMPLANT
PADDING CAST ABS 4INX4YD NS (CAST SUPPLIES)
PADDING CAST ABS COTTON 4X4 ST (CAST SUPPLIES) ×1 IMPLANT
PADDING CAST COTTON 3X4 STRL (CAST SUPPLIES) ×2
SPONGE GAUZE 4X4 12PLY (GAUZE/BANDAGES/DRESSINGS) ×2 IMPLANT
STOCKINETTE 4X48 STRL (DRAPES) ×2 IMPLANT
SUT VICRYL 4-0 PS2 18IN ABS (SUTURE) IMPLANT
SUT VICRYL RAPIDE 4/0 PS 2 (SUTURE) ×2 IMPLANT
SYR BULB 3OZ (MISCELLANEOUS) ×2 IMPLANT
SYR CONTROL 10ML LL (SYRINGE) ×1 IMPLANT
TOWEL OR 17X24 6PK STRL BLUE (TOWEL DISPOSABLE) ×1 IMPLANT
UNDERPAD 30X30 INCONTINENT (UNDERPADS AND DIAPERS) ×2 IMPLANT

## 2012-01-26 NOTE — H&P (Signed)
[name] is 41 yo who comes in complaining of numbness and tingling and pain in her hand, intermittent, moderate to severe in nature with a feeling of achiness to the left side. All of her fingers are numb. This has been present for the past several weeks. She has no history of injury. She is on dialysis and has a shunt in her left leg. She has undergone amputations of both legs, surgery on her right side with subsequent infection.   Nerve conductions are done by Dr. Johna Roles revealing carpal tunnel syndrome on her left side with a motor delay of 8.2, sensory delay of 3.9.  Past Medical History: She has no allergies. She is on the following medications: Calcium acet, Renagel, Cephalexin, Cartia XT, Hydroxyzine, Rena-Vite, Metoclopramide, Amitriptyline, Clonazepam, aspirin and Novolin at bedtime. She has had the amputations of her legs, the I&D for infection of her right hand.  Family Medical History: Positive for diabetes, heart disease, high BP.  Social History: She does not smoke or drink.   Review of Systems: Positive for diabetes, weight loss, glasses, high BP, kidney disease, [name], high blood pressure, kidney disease, otherwise negative. NOLIA Wang is an 41 y.o. female.   Chief Complaint: CTS lt HPI: see above  Past Medical History  Diagnosis Date  . GERD (gastroesophageal reflux disease)   . Diabetes mellitus     IDDM  . Hypertension     states has been on med. x "a long time"  . ESRF (end stage renal failure)     dialysis M,W, F; left thigh AV graft  . History of gangrene     left foot  . Hx MRSA infection   . 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 Av fistula placement 10/18/2008    right thigh AV graft  . Finger debridement 06/20/2010    right middle finger  . Finger amputation 05/27/2010    right middle  . Thrombectomy / arteriovenous graft revision 01/14/2010    left  superficial femoral artery; left thigh AV graft placement  . 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 Leg amputation below knee 10/11/2009    right  . Central venous catheter insertion 08/06/2009    removal right IJ Diatek cath., placement left IJ Diatek cath.  . 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 Foot amputation 04/04/2009    left transtibial amputation  . Excision / curettage bone cyst talus / calcaneus 03/04/2009    partial calcaneal exc. left; placement wound VAC  . Groin debridement 11/16/2008    right rectus femoris muscle flap to right groin wound; right groing debridement  . Femoral endarterectomy 11/06/2008    right common femoral; embolectomy right femoral artery; removal right femoral AV graft  . Central venous catheter insertion 07/10/2008; 12/31/2006; 01/19/2006; 06/19/2005; 09/19/2004; 09/08/2004    right IJ Diatek catheter  . Av fistula repair 07/08/2008    removal left upper arm AV graft  . Thrombectomy / arteriovenous graft revision 05/11/2007;12/31/2006; 01/18/2006; 12/16/2005    left upper arm  . Dialysis fistula creation 01/20/2007    left upper arm AV graft  . Dialysis fistula creation 01/28/2006    right upper arm AV graft  . Thrombectomy / arteriovenous graft revision 10/26/2005    left forearm AV graft  . Dialysis fistula creation 10/04/2005    left forearm AV graft  .  Av fistula repair 07/14/2005    removal infected right forearm AV graft  . Thrombectomy / arteriovenous graft revision 03/11/2005; 10/20/2004    right forearm AV graft  . Dialysis fistula creation 09/10/2004    right forearm AV graft  . Av fistula placement 07/18/2004    creation left AV fistula, radial to cephalic  . Finger exploration 07/13/2002    and repair left middle finger    Family History  Problem Relation Age of Onset  . Diabetes Mother   . Heart disease Mother   . Hypertension Mother   . Heart attack Mother 46  . Diabetes Sister   . Hypertension Brother   . Heart attack  Brother    Social History:  reports that she has never smoked. She has never used smokeless tobacco. She reports that she does not drink alcohol or use illicit drugs.  Allergies:  Allergies  Allergen Reactions  . Percocet (Oxycodone-Acetaminophen) Other (See Comments)    GI UPSET  . Soap Itching    IVORY SOAP    No prescriptions prior to admission    No results found for this or any previous visit (from the past 48 hour(s)).  No results found.   Pertinent items are noted in HPI.  Height 5\' 3"  (1.6 m), weight 90.719 kg (200 lb).  General appearance: alert, cooperative and appears stated age Head: Normocephalic, without obvious abnormality Neck: no adenopathy Resp: clear to auscultation bilaterally Cardio: regular rate and rhythm, S1, S2 normal, no murmur, click, rub or gallop GI: soft, non-tender; bowel sounds normal; no masses,  no organomegaly Extremities: ampiutation bil legs Pulses: 2+ and symmetric Skin: Skin color, texture, turgor normal. No rashes or lesions Neurologic: Grossly normal Incision/Wound: na  Assessment/Plan We have discussed this with her. We would recommend surgical decompression. The pre, peri and post op course are discussed along with risks and complications. She is aware there is no guarantee with surgery, possibility of infection, recurrence, injury to arteries, nerves and tendons, incomplete relief of symptoms and dystrophy.  She is scheduled for left carpal tunnel release as an outpatient.  [name] R 01/26/2012, 5:29 AM

## 2012-01-26 NOTE — Op Note (Signed)
[name], [name]               ACCOUNT NO.:  [id number]  MEDICAL RECORD NO.:  [id number]  LOCATION:                                 FACILITY:  PHYSICIAN:  Cindee Salt, M.D.       DATE OF BIRTH:  05-20-1971  DATE OF PROCEDURE:  01/26/2012 DATE OF DISCHARGE:                              OPERATIVE REPORT   PREOPERATIVE DIAGNOSIS:  Carpal tunnel syndrome, left hand.  POSTOPERATIVE DIAGNOSIS:  Carpal tunnel syndrome, left hand.  OPERATION:  Decompression of left median nerve.  SURGEON:  Cindee Salt, MD  ANESTHESIA:  Local with IV sedation.  ANESTHESIOLOGIST:  [name]. Krista Blue, MD  HISTORY:  The patient is a 41 year old female with a history of renal dialysis.  She has undergone procedures on her right side.  She is admitted now for release of her left carpal tunnels.  Nerve conductions are positive.  She is complaining of numbness and tingling, pain on her left side of median nerve distribution.  Pre, peri, and postoperative courses have been discussed along with risks and complications.  She is aware that there is no guarantee with the surgery, possibility of infection, recurrence of injury to arteries, nerves, and tendons, incomplete relief of symptoms, and dystrophy.  In the preoperative area, the patient is seen, the extremity is marked by both the patient and surgeon, and antibiotic is given.  PROCEDURE:  The patient was brought to the operating room where a local anesthetic was carried out without difficulty.  Using Marcaine and Xylocaine mixture, 1% plain with 0.25% Marcaine both plain, she was prepped using ChloraPrep, supine position, left arm free.  A 3-minute dry time was allowed.  A time-out was taken confirming the patient and procedure.  The limb was exsanguinated with an Esmarch bandage. Tourniquet was placed on forearm was inflated to 250 mmHg.  A straight incision was made after adequate anesthesia was afforded and sedation given, longitudinally in the palm,  carried down through subcutaneous tissue.  Bleeders were electrocauterized with bipolar.  The palmar fascia was split.  Superficial palmar arch identified.  The flexor tendon to the ring and little finger identified to the ulnar side of the median nerve.  Carpal retinaculum was incised with sharp dissection. Right angle and Sewall retractor placed between skin and forearm fascia. The fascia released for approximately a centimeter and half proximal to the wrist crease under direct vision.  Significant thickening of fascia was noted.  The area of compression to the nerve was immediately apparent with an area of significant hyperemia over a prolonged course.  The wound was copiously irrigated with saline.  The skin was then closed with 4-0 Vicryl Rapide sutures. Sterile compressive dressing was applied with the fingers free.  On deflation of the tourniquet, all fingers immediately pinked.  She was taken to the recovery room for observation.          ______________________________ Cindee Salt, M.D.     GK/MEDQ  D:  01/26/2012  T:  01/26/2012  Job:  [id number]

## 2012-01-26 NOTE — Discharge Instructions (Addendum)

## 2012-01-26 NOTE — Brief Op Note (Signed)
01/26/2012  9:27 AM  PATIENT:  [name]  41 y.o. female  PRE-OPERATIVE DIAGNOSIS:  left carpal tunnel syndrome  POST-OPERATIVE DIAGNOSIS:  left carpal tunnel syndrome  PROCEDURE:  Procedure(s) (LRB): CARPAL TUNNEL RELEASE (Left)  SURGEON:  Surgeon(s) and Role:    * Nicki Reaper, MD - Primary  PHYSICIAN ASSISTANT:   ASSISTANTS: none   ANESTHESIA:   local and IV sedation  EBL:     BLOOD ADMINISTERED:none  DRAINS: none   LOCAL MEDICATIONS USED:  MARCAINE    and LIDOCAINE   SPECIMEN:  No Specimen  DISPOSITION OF SPECIMEN:  N/A  COUNTS:  YES  TOURNIQUET:   Total Tourniquet Time Documented: Upper Arm (Left) - 14 minutes  DICTATION: .Other Dictation: Dictation Number [id number]  PLAN OF CARE: Discharge to home after PACU  PATIENT DISPOSITION:  PACU - hemodynamically stable.

## 2012-01-26 NOTE — Progress Notes (Signed)
Dr. Krista Blue notified of patient's CBG value.  No orders

## 2012-01-26 NOTE — Transfer of Care (Signed)
Immediate Anesthesia Transfer of Care Note  Patient: [name]  Procedure(s) Performed: Procedure(s) (LRB): CARPAL TUNNEL RELEASE (Left)  Patient Location: PACU  Anesthesia Type: General  Level of Consciousness: awake  Airway & Oxygen Therapy: Patient Spontanous Breathing and Patient connected to face mask oxygen  Post-op Assessment: Report given to PACU RN and Post -op Vital signs reviewed and stable  Post vital signs: Reviewed and stable  Complications: No apparent anesthesia complications

## 2012-01-26 NOTE — Op Note (Signed)
Dictated number ; [id number]

## 2012-01-26 NOTE — Transfer of Care (Incomplete)
Immediate Anesthesia Transfer of Care Note  Patient: [name]  Procedure(s) Performed: Procedure(s) (LRB): CARPAL TUNNEL RELEASE (Left)  Patient Location: PACU  Anesthesia Type: MAC  Level of Consciousness: awake and sedated  Airway & Oxygen Therapy: Patient Spontanous Breathing and Patient connected to face mask oxygen  Post-op Assessment: Report given to PACU RN and Post -op Vital signs reviewed and stable  Post vital signs: Reviewed and stable  Complications: {FINDINGS; ANE POST COMPLICATIONS:[id number]}

## 2012-01-26 NOTE — Anesthesia Preprocedure Evaluation (Signed)
Anesthesia Evaluation  Patient identified by MRN, date of birth, ID band Patient awake    Reviewed: Allergy & Precautions, H&P , NPO status , Patient's Chart, lab work & pertinent test results, reviewed documented beta blocker date and time   History of Anesthesia Complications Negative for: history of anesthetic complications  Airway Mallampati: II TM Distance: >3 FB     Dental  (+) Missing and Dental Advisory Given   Pulmonary neg pulmonary ROS,  breath sounds clear to auscultation  Pulmonary exam normal       Cardiovascular hypertension, Pt. on medications Rhythm:Regular Rate:Normal     Neuro/Psych negative neurological ROS     GI/Hepatic Neg liver ROS, GERD-  Medicated and Controlled,  Endo/Other  Diabetes mellitus-, Type 1, Insulin DependentMorbid obesity  Renal/GU Renal disease     Musculoskeletal   Abdominal   Peds  Hematology   Anesthesia Other Findings   Reproductive/Obstetrics                           Anesthesia Physical Anesthesia Plan  ASA: III  Anesthesia Plan: MAC   Post-op Pain Management:    Induction:   Airway Management Planned: Simple Face Mask  Additional Equipment:   Intra-op Plan:   Post-operative Plan:   Informed Consent: I have reviewed the patients History and Physical, chart, labs and discussed the procedure including the risks, benefits and alternatives for the proposed anesthesia with the patient or authorized representative who has indicated his/her understanding and acceptance.   Dental advisory given  Plan Discussed with: CRNA, Anesthesiologist and Surgeon  Anesthesia Plan Comments:         Anesthesia Quick Evaluation

## 2012-01-26 NOTE — Anesthesia Postprocedure Evaluation (Signed)
Anesthesia Post Note  Patient: [name]  Procedure(s) Performed: Procedure(s) (LRB): CARPAL TUNNEL RELEASE (Left)  Anesthesia type: general  Patient location: PACU  Post pain: Pain level controlled  Post assessment: Patient's Cardiovascular Status Stable  Last Vitals:  Filed Vitals:   01/26/12 1015  BP: 118/65  Pulse: 93  Temp: 36.9 C  Resp: 16    Post vital signs: Reviewed and stable  Level of consciousness: sedated  Complications: No apparent anesthesia complications

## 2012-01-28 ENCOUNTER — Encounter (HOSPITAL_BASED_OUTPATIENT_CLINIC_OR_DEPARTMENT_OTHER): Payer: Self-pay | Admitting: Orthopedic Surgery

## 2012-03-17 ENCOUNTER — Encounter: Payer: Self-pay | Admitting: Neurosurgery

## 2012-03-18 ENCOUNTER — Ambulatory Visit: Payer: Medicare Other | Admitting: Neurosurgery

## 2012-03-24 ENCOUNTER — Encounter: Payer: Self-pay | Admitting: Neurosurgery

## 2012-03-25 ENCOUNTER — Ambulatory Visit: Payer: Medicare Other | Admitting: Neurosurgery

## 2012-03-28 ENCOUNTER — Encounter: Payer: Self-pay | Admitting: Neurosurgery

## 2012-03-29 ENCOUNTER — Ambulatory Visit: Payer: Medicare Other | Admitting: Neurosurgery

## 2012-04-04 ENCOUNTER — Encounter: Payer: Self-pay | Admitting: Neurosurgery

## 2012-04-05 ENCOUNTER — Ambulatory Visit: Payer: Medicare Other | Admitting: Neurosurgery

## 2012-04-24 ENCOUNTER — Encounter (HOSPITAL_COMMUNITY): Payer: Self-pay | Admitting: Certified Registered"

## 2012-04-24 ENCOUNTER — Inpatient Hospital Stay (HOSPITAL_COMMUNITY)
Admission: EM | Admit: 2012-04-24 | Discharge: 2012-04-29 | DRG: 264 | Disposition: A | Discharge status: Home / Self Care | Payer: Medicare Other | Attending: Internal Medicine | Admitting: Internal Medicine

## 2012-04-24 ENCOUNTER — Encounter (HOSPITAL_COMMUNITY)
Admission: EM | Disposition: A | Discharge status: Home / Self Care | Payer: Self-pay | Source: Home / Self Care | Attending: Emergency Medicine

## 2012-04-24 ENCOUNTER — Encounter (HOSPITAL_COMMUNITY): Payer: Self-pay | Admitting: Anesthesiology

## 2012-04-24 ENCOUNTER — Emergency Department (HOSPITAL_COMMUNITY): Payer: Medicare Other

## 2012-04-24 ENCOUNTER — Emergency Department (HOSPITAL_COMMUNITY): Payer: Medicare Other | Admitting: Certified Registered"

## 2012-04-24 ENCOUNTER — Encounter (HOSPITAL_COMMUNITY): Payer: Self-pay | Admitting: Emergency Medicine

## 2012-04-24 ENCOUNTER — Emergency Department (HOSPITAL_COMMUNITY)
Admission: EM | Admit: 2012-04-24 | Discharge: 2012-04-24 | Disposition: A | Discharge status: Home / Self Care | Payer: Medicare Other | Source: Home / Self Care | Attending: Emergency Medicine | Admitting: Emergency Medicine

## 2012-04-24 ENCOUNTER — Encounter (HOSPITAL_COMMUNITY): Payer: Self-pay | Admitting: *Deleted

## 2012-04-24 DIAGNOSIS — I721 Aneurysm of artery of upper extremity: Secondary | ICD-10-CM | POA: Diagnosis present

## 2012-04-24 DIAGNOSIS — K219 Gastro-esophageal reflux disease without esophagitis: Secondary | ICD-10-CM | POA: Diagnosis present

## 2012-04-24 DIAGNOSIS — Y832 Surgical operation with anastomosis, bypass or graft as the cause of abnormal reaction of the patient, or of later complication, without mention of misadventure at the time of the procedure: Secondary | ICD-10-CM | POA: Diagnosis present

## 2012-04-24 DIAGNOSIS — T82898A Other specified complication of vascular prosthetic devices, implants and grafts, initial encounter: Secondary | ICD-10-CM

## 2012-04-24 DIAGNOSIS — D649 Anemia, unspecified: Secondary | ICD-10-CM

## 2012-04-24 DIAGNOSIS — Z992 Dependence on renal dialysis: Secondary | ICD-10-CM

## 2012-04-24 DIAGNOSIS — S88119A Complete traumatic amputation at level between knee and ankle, unspecified lower leg, initial encounter: Secondary | ICD-10-CM | POA: Insufficient documentation

## 2012-04-24 DIAGNOSIS — Z79899 Other long term (current) drug therapy: Secondary | ICD-10-CM | POA: Insufficient documentation

## 2012-04-24 DIAGNOSIS — S98919A Complete traumatic amputation of unspecified foot, level unspecified, initial encounter: Secondary | ICD-10-CM

## 2012-04-24 DIAGNOSIS — T829XXA Unspecified complication of cardiac and vascular prosthetic device, implant and graft, initial encounter: Secondary | ICD-10-CM

## 2012-04-24 DIAGNOSIS — I120 Hypertensive chronic kidney disease with stage 5 chronic kidney disease or end stage renal disease: Secondary | ICD-10-CM | POA: Insufficient documentation

## 2012-04-24 DIAGNOSIS — E1169 Type 2 diabetes mellitus with other specified complication: Secondary | ICD-10-CM | POA: Diagnosis not present

## 2012-04-24 DIAGNOSIS — N186 End stage renal disease: Secondary | ICD-10-CM

## 2012-04-24 DIAGNOSIS — R5381 Other malaise: Secondary | ICD-10-CM | POA: Insufficient documentation

## 2012-04-24 DIAGNOSIS — S68118A Complete traumatic metacarpophalangeal amputation of other finger, initial encounter: Secondary | ICD-10-CM

## 2012-04-24 DIAGNOSIS — R109 Unspecified abdominal pain: Secondary | ICD-10-CM | POA: Insufficient documentation

## 2012-04-24 DIAGNOSIS — R7881 Bacteremia: Secondary | ICD-10-CM

## 2012-04-24 DIAGNOSIS — T365X5A Adverse effect of aminoglycosides, initial encounter: Secondary | ICD-10-CM | POA: Diagnosis not present

## 2012-04-24 DIAGNOSIS — R5383 Other fatigue: Secondary | ICD-10-CM | POA: Diagnosis present

## 2012-04-24 DIAGNOSIS — B9689 Other specified bacterial agents as the cause of diseases classified elsewhere: Secondary | ICD-10-CM | POA: Diagnosis present

## 2012-04-24 DIAGNOSIS — N2581 Secondary hyperparathyroidism of renal origin: Secondary | ICD-10-CM | POA: Diagnosis present

## 2012-04-24 DIAGNOSIS — Z8614 Personal history of Methicillin resistant Staphylococcus aureus infection: Secondary | ICD-10-CM

## 2012-04-24 DIAGNOSIS — Z794 Long term (current) use of insulin: Secondary | ICD-10-CM | POA: Insufficient documentation

## 2012-04-24 DIAGNOSIS — I959 Hypotension, unspecified: Secondary | ICD-10-CM

## 2012-04-24 DIAGNOSIS — IMO0001 Reserved for inherently not codable concepts without codable children: Secondary | ICD-10-CM | POA: Diagnosis present

## 2012-04-24 DIAGNOSIS — E119 Type 2 diabetes mellitus without complications: Secondary | ICD-10-CM | POA: Insufficient documentation

## 2012-04-24 DIAGNOSIS — D631 Anemia in chronic kidney disease: Secondary | ICD-10-CM | POA: Diagnosis present

## 2012-04-24 DIAGNOSIS — D72829 Elevated white blood cell count, unspecified: Secondary | ICD-10-CM

## 2012-04-24 DIAGNOSIS — M549 Dorsalgia, unspecified: Secondary | ICD-10-CM | POA: Diagnosis present

## 2012-04-24 DIAGNOSIS — R112 Nausea with vomiting, unspecified: Secondary | ICD-10-CM

## 2012-04-24 DIAGNOSIS — A498 Other bacterial infections of unspecified site: Secondary | ICD-10-CM

## 2012-04-24 DIAGNOSIS — R197 Diarrhea, unspecified: Secondary | ICD-10-CM | POA: Diagnosis not present

## 2012-04-24 DIAGNOSIS — S78119A Complete traumatic amputation at level between unspecified hip and knee, initial encounter: Secondary | ICD-10-CM

## 2012-04-24 DIAGNOSIS — T827XXA Infection and inflammatory reaction due to other cardiac and vascular devices, implants and grafts, initial encounter: Principal | ICD-10-CM

## 2012-04-24 DIAGNOSIS — R1110 Vomiting, unspecified: Secondary | ICD-10-CM

## 2012-04-24 DIAGNOSIS — E860 Dehydration: Secondary | ICD-10-CM

## 2012-04-24 DIAGNOSIS — Y9210 Unspecified residential institution as the place of occurrence of the external cause: Secondary | ICD-10-CM | POA: Diagnosis not present

## 2012-04-24 HISTORY — PX: THROMBECTOMY AND REVISION OF ARTERIOVENTOUS (AV) GORETEX  GRAFT: SHX6120

## 2012-04-24 LAB — CBC WITH DIFFERENTIAL/PLATELET
Basophils Absolute: 0 10*3/uL (ref 0.0–0.1)
Basophils Absolute: 0 10*3/uL (ref 0.0–0.1)
Basophils Relative: 0 % (ref 0–1)
Eosinophils Absolute: 0 10*3/uL (ref 0.0–0.7)
Eosinophils Relative: 0 % (ref 0–5)
Hemoglobin: 8.3 g/dL — ABNORMAL LOW (ref 12.0–15.0)
Lymphocytes Relative: 7 % — ABNORMAL LOW (ref 12–46)
MCH: 28.9 pg (ref 26.0–34.0)
MCHC: 35.5 g/dL (ref 30.0–36.0)
MCV: 85.4 fL (ref 78.0–100.0)
Monocytes Relative: 4 % (ref 3–12)
Monocytes Relative: 7 % (ref 3–12)
Neutro Abs: 20.5 10*3/uL — ABNORMAL HIGH (ref 1.7–7.7)
Neutrophils Relative %: 89 % — ABNORMAL HIGH (ref 43–77)
Neutrophils Relative %: 86 % — ABNORMAL HIGH (ref 43–77)
Platelets: 211 10*3/uL (ref 150–400)
Platelets: 170 10*3/uL (ref 150–400)
RBC: 2.06 MIL/uL — ABNORMAL LOW (ref 3.87–5.11)
RDW: 14.2 % (ref 11.5–15.5)
WBC Morphology: INCREASED
WBC: 33.5 10*3/uL — ABNORMAL HIGH (ref 4.0–10.5)

## 2012-04-24 LAB — GLUCOSE, CAPILLARY: Glucose-Capillary: 408 mg/dL — ABNORMAL HIGH (ref 70–99)

## 2012-04-24 LAB — COMPREHENSIVE METABOLIC PANEL
ALT: 9 U/L (ref 0–35)
AST: 20 U/L (ref 0–37)
Albumin: 2.6 g/dL — ABNORMAL LOW (ref 3.5–5.2)
Alkaline Phosphatase: 73 U/L (ref 39–117)
BUN: 27 mg/dL — ABNORMAL HIGH (ref 6–23)
Chloride: 92 mEq/L — ABNORMAL LOW (ref 96–112)
Potassium: 3.9 mEq/L (ref 3.5–5.1)
Sodium: 133 mEq/L — ABNORMAL LOW (ref 135–145)
Total Bilirubin: 0.2 mg/dL — ABNORMAL LOW (ref 0.3–1.2)
Total Protein: 6.6 g/dL (ref 6.0–8.3)

## 2012-04-24 LAB — POCT I-STAT, CHEM 8
BUN: 21 mg/dL (ref 6–23)
Chloride: 93 mEq/L — ABNORMAL LOW (ref 96–112)
Potassium: 3.7 mEq/L (ref 3.5–5.1)
Sodium: 134 mEq/L — ABNORMAL LOW (ref 135–145)
TCO2: 25 mmol/L (ref 0–100)

## 2012-04-24 LAB — LIPASE, BLOOD: Lipase: 21 U/L (ref 11–59)

## 2012-04-24 LAB — PREPARE RBC (CROSSMATCH)

## 2012-04-24 LAB — PROTIME-INR
INR: 1.19 (ref 0.00–1.49)
Prothrombin Time: 15.4 seconds — ABNORMAL HIGH (ref 11.6–15.2)

## 2012-04-24 LAB — POCT I-STAT 4, (NA,K, GLUC, HGB,HCT)
HCT: 28 % — ABNORMAL LOW (ref 36.0–46.0)
Potassium: 4 mEq/L (ref 3.5–5.1)
Sodium: 136 mEq/L (ref 135–145)

## 2012-04-24 LAB — APTT: aPTT: 27 seconds (ref 24–37)

## 2012-04-24 SURGERY — THROMBECTOMY AND REVISION OF ARTERIOVENTOUS (AV) GORETEX  GRAFT
Anesthesia: Monitor Anesthesia Care | Site: Thigh | Laterality: Left | Wound class: Clean

## 2012-04-24 MED ORDER — OLOPATADINE HCL 0.1 % OP SOLN
1.0000 [drp] | Freq: Two times a day (BID) | OPHTHALMIC | Status: DC
  Administered 2012-04-25 – 2012-04-29 (×9): 1 [drp] via OPHTHALMIC
  Filled 2012-04-24: qty 5

## 2012-04-24 MED ORDER — POLYMYXIN B-TRIMETHOPRIM 10000-0.1 UNIT/ML-% OP SOLN
1.0000 [drp] | OPHTHALMIC | Status: DC
  Administered 2012-04-25 – 2012-04-29 (×18): 1 [drp] via OPHTHALMIC
  Filled 2012-04-24: qty 10

## 2012-04-24 MED ORDER — SODIUM CHLORIDE 0.9 % IJ SOLN
3.0000 mL | Freq: Two times a day (BID) | INTRAMUSCULAR | Status: DC
  Administered 2012-04-25 – 2012-04-28 (×8): 3 mL via INTRAVENOUS

## 2012-04-24 MED ORDER — ONDANSETRON HCL 4 MG/2ML IJ SOLN: 4.0000 mg | Freq: Three times a day (TID) | INTRAMUSCULAR | Status: DC | PRN

## 2012-04-24 MED ORDER — SODIUM CHLORIDE 0.9 % IV BOLUS (SEPSIS)
500.0000 mL | Freq: Once | INTRAVENOUS | Status: AC
  Administered 2012-04-24: 500 mL via INTRAVENOUS

## 2012-04-24 MED ORDER — ACETAMINOPHEN 325 MG PO TABS
650.0000 mg | ORAL_TABLET | Freq: Four times a day (QID) | ORAL | Status: DC | PRN
  Administered 2012-04-27: 650 mg via ORAL
  Filled 2012-04-24: qty 2

## 2012-04-24 MED ORDER — SODIUM CHLORIDE 0.9 % IR SOLN
Status: DC | PRN
  Administered 2012-04-24: 09:00:00

## 2012-04-24 MED ORDER — MIDAZOLAM HCL 2 MG/2ML IJ SOLN: 0.5000 mg | Freq: Once | INTRAMUSCULAR | Status: DC | PRN

## 2012-04-24 MED ORDER — TRIAMCINOLONE ACETONIDE 0.1 % EX CREA
1.0000 "application " | TOPICAL_CREAM | Freq: Two times a day (BID) | CUTANEOUS | Status: DC
  Administered 2012-04-25 – 2012-04-29 (×8): 1 via TOPICAL
  Filled 2012-04-24: qty 15

## 2012-04-24 MED ORDER — ASPIRIN EC 81 MG PO TBEC
81.0000 mg | DELAYED_RELEASE_TABLET | Freq: Every day | ORAL | Status: DC
  Administered 2012-04-25 – 2012-04-29 (×5): 81 mg via ORAL
  Filled 2012-04-24 (×5): qty 1

## 2012-04-24 MED ORDER — MEPERIDINE HCL 25 MG/ML IJ SOLN: 6.2500 mg | INTRAMUSCULAR | Status: DC | PRN

## 2012-04-24 MED ORDER — ONDANSETRON HCL 4 MG/2ML IJ SOLN
4.0000 mg | Freq: Once | INTRAMUSCULAR | Status: AC
  Administered 2012-04-24: 4 mg via INTRAVENOUS
  Filled 2012-04-24: qty 2

## 2012-04-24 MED ORDER — IOHEXOL 300 MG/ML  SOLN
40.0000 mL | Freq: Once | INTRAMUSCULAR | Status: AC | PRN
  Administered 2012-04-24: 40 mL via ORAL

## 2012-04-24 MED ORDER — TRAMADOL HCL 50 MG PO TABS: 50.0000 mg | ORAL_TABLET | Freq: Four times a day (QID) | ORAL | Status: DC | PRN

## 2012-04-24 MED ORDER — ONDANSETRON HCL 4 MG PO TABS: 4.0000 mg | ORAL_TABLET | Freq: Four times a day (QID) | ORAL | Status: DC | PRN

## 2012-04-24 MED ORDER — PANTOPRAZOLE SODIUM 40 MG PO TBEC
40.0000 mg | DELAYED_RELEASE_TABLET | Freq: Every day | ORAL | Status: DC
  Administered 2012-04-24: 40 mg via ORAL
  Filled 2012-04-24: qty 1

## 2012-04-24 MED ORDER — LIDOCAINE HCL (PF) 1 % IJ SOLN
2.0000 mL | Freq: Once | INTRAMUSCULAR | Status: AC
  Administered 2012-04-24: 2 mL via INTRADERMAL

## 2012-04-24 MED ORDER — IOHEXOL 300 MG/ML  SOLN: 20.0000 mL | INTRAMUSCULAR | Status: DC

## 2012-04-24 MED ORDER — INSULIN ASPART 100 UNIT/ML ~~LOC~~ SOLN
4.0000 [IU] | Freq: Once | SUBCUTANEOUS | Status: AC
  Administered 2012-04-24: 4 [IU] via SUBCUTANEOUS

## 2012-04-24 MED ORDER — SODIUM CHLORIDE 0.9 % IV SOLN
INTRAVENOUS | Status: DC | PRN
  Administered 2012-04-24: 08:00:00 via INTRAVENOUS

## 2012-04-24 MED ORDER — CALCIUM ACETATE 667 MG PO CAPS
667.0000 mg | ORAL_CAPSULE | Freq: Three times a day (TID) | ORAL | Status: DC
  Administered 2012-04-25 – 2012-04-29 (×8): 667 mg via ORAL
  Filled 2012-04-24 (×16): qty 1

## 2012-04-24 MED ORDER — PANTOPRAZOLE SODIUM 40 MG PO TBEC
40.0000 mg | DELAYED_RELEASE_TABLET | Freq: Every day | ORAL | Status: DC
  Administered 2012-04-25 – 2012-04-29 (×4): 40 mg via ORAL
  Filled 2012-04-24 (×2): qty 1

## 2012-04-24 MED ORDER — METOCLOPRAMIDE HCL 5 MG PO TABS
5.0000 mg | ORAL_TABLET | Freq: Three times a day (TID) | ORAL | Status: DC | PRN
  Administered 2012-04-25 (×2): 5 mg via ORAL
  Filled 2012-04-24 (×3): qty 1

## 2012-04-24 MED ORDER — FENTANYL CITRATE 0.05 MG/ML IJ SOLN
INTRAMUSCULAR | Status: DC | PRN
  Administered 2012-04-24 (×3): 50 ug via INTRAVENOUS

## 2012-04-24 MED ORDER — INSULIN ASPART PROT & ASPART (70-30 MIX) 100 UNIT/ML ~~LOC~~ SUSP: 20.0000 [IU] | Freq: Every day | SUBCUTANEOUS | Status: DC

## 2012-04-24 MED ORDER — INSULIN ASPART 100 UNIT/ML ~~LOC~~ SOLN
0.0000 [IU] | Freq: Three times a day (TID) | SUBCUTANEOUS | Status: DC
  Administered 2012-04-25: 9 [IU] via SUBCUTANEOUS
  Administered 2012-04-25: 7 [IU] via SUBCUTANEOUS

## 2012-04-24 MED ORDER — ONDANSETRON HCL 4 MG/2ML IJ SOLN
4.0000 mg | Freq: Four times a day (QID) | INTRAMUSCULAR | Status: DC | PRN
  Administered 2012-04-25 – 2012-04-26 (×4): 4 mg via INTRAVENOUS
  Filled 2012-04-24 (×4): qty 2

## 2012-04-24 MED ORDER — CEFAZOLIN SODIUM 1-5 GM-% IV SOLN
INTRAVENOUS | Status: DC | PRN
  Administered 2012-04-24: 2 g via INTRAVENOUS

## 2012-04-24 MED ORDER — ETOMIDATE 2 MG/ML IV SOLN
INTRAVENOUS | Status: DC | PRN
  Administered 2012-04-24: 2 mg via INTRAVENOUS

## 2012-04-24 MED ORDER — FENTANYL CITRATE 0.05 MG/ML IJ SOLN
25.0000 ug | INTRAMUSCULAR | Status: DC | PRN
  Administered 2012-04-24: 25 ug via INTRAVENOUS

## 2012-04-24 MED ORDER — ONDANSETRON HCL 4 MG/2ML IJ SOLN
INTRAMUSCULAR | Status: AC
  Filled 2012-04-24: qty 2

## 2012-04-24 MED ORDER — PROMETHAZINE HCL 25 MG/ML IJ SOLN
6.2500 mg | INTRAMUSCULAR | Status: DC | PRN
  Administered 2012-04-24: 6.25 mg via INTRAVENOUS

## 2012-04-24 MED ORDER — LIDOCAINE HCL (PF) 1 % IJ SOLN
INTRAMUSCULAR | Status: AC
  Filled 2012-04-24: qty 5

## 2012-04-24 MED ORDER — INSULIN ASPART 100 UNIT/ML ~~LOC~~ SOLN
SUBCUTANEOUS | Status: DC | PRN
  Administered 2012-04-24: 4 [IU] via SUBCUTANEOUS

## 2012-04-24 MED ORDER — LIDOCAINE-EPINEPHRINE 0.5 %-1:200000 IJ SOLN
INTRAMUSCULAR | Status: DC | PRN
  Administered 2012-04-24: 10 mL

## 2012-04-24 MED ORDER — ACETAMINOPHEN 650 MG RE SUPP: 650.0000 mg | Freq: Four times a day (QID) | RECTAL | Status: DC | PRN

## 2012-04-24 MED ORDER — ONDANSETRON HCL 4 MG/2ML IJ SOLN
4.0000 mg | Freq: Once | INTRAMUSCULAR | Status: AC
  Administered 2012-04-24: 4 mg via INTRAVENOUS

## 2012-04-24 MED ORDER — 0.9 % SODIUM CHLORIDE (POUR BTL) OPTIME
TOPICAL | Status: DC | PRN
  Administered 2012-04-24: 1000 mL

## 2012-04-24 MED ORDER — TRAMADOL HCL 50 MG PO TABS
50.0000 mg | ORAL_TABLET | Freq: Four times a day (QID) | ORAL | Status: DC | PRN
  Administered 2012-04-25 – 2012-04-26 (×4): 50 mg via ORAL
  Filled 2012-04-24 (×5): qty 1

## 2012-04-24 MED ORDER — MIDAZOLAM HCL 5 MG/5ML IJ SOLN
INTRAMUSCULAR | Status: DC | PRN
  Administered 2012-04-24 (×2): 1 mg via INTRAVENOUS

## 2012-04-24 MED ORDER — ACETAMINOPHEN 325 MG PO TABS
650.0000 mg | ORAL_TABLET | Freq: Once | ORAL | Status: AC
  Administered 2012-04-24: 650 mg via ORAL
  Filled 2012-04-24: qty 2

## 2012-04-24 MED ORDER — NAPHAZOLINE-GLYCERIN 0.012-0.2 % OP SOLN: 1.0000 [drp] | OPHTHALMIC | Status: DC | PRN

## 2012-04-24 MED ORDER — HYDROXYZINE HCL 25 MG PO TABS
25.0000 mg | ORAL_TABLET | Freq: Three times a day (TID) | ORAL | Status: DC | PRN
  Administered 2012-04-25 – 2012-04-26 (×2): 25 mg via ORAL
  Filled 2012-04-24 (×2): qty 1

## 2012-04-24 SURGICAL SUPPLY — 33 items
APL SKNCLS STERI-STRIP NONHPOA (GAUZE/BANDAGES/DRESSINGS) ×1
BENZOIN TINCTURE PRP APPL 2/3 (GAUZE/BANDAGES/DRESSINGS) ×2 IMPLANT
CANISTER SUCTION 2500CC (MISCELLANEOUS) ×2 IMPLANT
CATH EMB 4FR 80CM (CATHETERS) ×2 IMPLANT
CLIP LIGATING EXTRA MED SLVR (CLIP) ×2 IMPLANT
CLIP LIGATING EXTRA SM BLUE (MISCELLANEOUS) ×2 IMPLANT
CLOTH BEACON ORANGE TIMEOUT ST (SAFETY) ×2 IMPLANT
COVER SURGICAL LIGHT HANDLE (MISCELLANEOUS) ×2 IMPLANT
DECANTER SPIKE VIAL GLASS SM (MISCELLANEOUS) ×2 IMPLANT
ELECT REM PT RETURN 9FT ADLT (ELECTROSURGICAL) ×2
ELECTRODE REM PT RTRN 9FT ADLT (ELECTROSURGICAL) ×1 IMPLANT
GEL ULTRASOUND 20GR AQUASONIC (MISCELLANEOUS) IMPLANT
GLOVE SS BIOGEL STRL SZ 7.5 (GLOVE) ×1 IMPLANT
GLOVE SUPERSENSE BIOGEL SZ 7.5 (GLOVE) ×1
GOWN STRL NON-REIN LRG LVL3 (GOWN DISPOSABLE) ×5 IMPLANT
GRAFT GORETEX 6X10 (Vascular Products) ×1 IMPLANT
KIT BASIN OR (CUSTOM PROCEDURE TRAY) ×2 IMPLANT
KIT ROOM TURNOVER OR (KITS) ×2 IMPLANT
NDL HYPO 25X1 1.5 SAFETY (NEEDLE) IMPLANT
NEEDLE HYPO 25X1 1.5 SAFETY (NEEDLE) ×2 IMPLANT
NS IRRIG 1000ML POUR BTL (IV SOLUTION) ×2 IMPLANT
PACK CV ACCESS (CUSTOM PROCEDURE TRAY) ×2 IMPLANT
PAD ARMBOARD 7.5X6 YLW CONV (MISCELLANEOUS) ×4 IMPLANT
SPONGE GAUZE 4X4 12PLY (GAUZE/BANDAGES/DRESSINGS) ×2 IMPLANT
STRIP CLOSURE SKIN 1/2X4 (GAUZE/BANDAGES/DRESSINGS) ×2 IMPLANT
SUT PROLENE 6 0 CC (SUTURE) ×2 IMPLANT
SUT VIC AB 3-0 SH 27 (SUTURE) ×4
SUT VIC AB 3-0 SH 27X BRD (SUTURE) ×1 IMPLANT
TAPE CLOTH SURG 4X10 WHT LF (GAUZE/BANDAGES/DRESSINGS) ×1 IMPLANT
TOWEL OR 17X24 6PK STRL BLUE (TOWEL DISPOSABLE) ×2 IMPLANT
TOWEL OR 17X26 10 PK STRL BLUE (TOWEL DISPOSABLE) ×2 IMPLANT
UNDERPAD 30X30 INCONTINENT (UNDERPADS AND DIAPERS) ×2 IMPLANT
WATER STERILE IRR 1000ML POUR (IV SOLUTION) ×1 IMPLANT

## 2012-04-24 NOTE — ED Notes (Signed)
Pt will go to the OR

## 2012-04-24 NOTE — ED Notes (Addendum)
Blood product taken with patient to the OR. Not given in the ED

## 2012-04-24 NOTE — Op Note (Signed)
OPERATIVE REPORT  DATE OF SURGERY: 04/24/2012  PATIENT: [name], 41 y.o. 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   ASSISTANT: Nurse  ANESTHESIA:  Local with sedation  EBL: Less than 50 procedure. Patient did have bleeding prior to presentation to the emergency department ml  Total I/O In: 1100 [I.V.:400; Blood:700] Out: -   BLOOD ADMINISTERED: 2 units packed cells  DRAINS: None  SPECIMEN: None  COUNTS CORRECT:  YES  PLAN OF CARE: PACU   PATIENT DISPOSITION:  PACU - hemodynamically stable  PROCEDURE DETAILS: Patient presented to the emergency department the morning of procedure with significant bleeding from her left femoral loop AV Gore-Tex graft. At that she clearly had an erosion into the graft to area of false aneurysm. She was taken urgently to the operating room for repair  The area the left groin and left leg were prepped and draped in a sterile fashion. The area of erosion was on the venous limb of the graft in the medial aspect. Using local anesthesia an incision was made over the graft a proximal and distal to this area and the graft was controlled at this area. There was no evidence of graft infection. A, was created lateral to the old venous limb of the graft and a new 6 mm Gore-Tex graft was brought through the tunnel. The graft was occluded with fistula clamps and the incision above and below the disrupted area. The graft was transected and the new interposition graft was sewn in with a running 6-0 Prolene suture. After both anastomoses clamps removed and good thrill was noted through the graft. The wounds were irrigated with saline and hemostasis daily cautery. The new incisions were closed  with 3-0 Vicryl in the subcutaneous and subcuticular tissue. Attention was turned to the area of graft disruption. This was debrided. There was no evidence of posterior there was hematoma this area where she had a false aneurysm with general 100 and to the skin. 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 The wound that was debrided and the skin edges were also debrided back to viable tissue. This incision was irrigated hemostasis obtained with cautery the wound was closed with a single 3-0 nylon mattress suture. Dressing was applied   Gretta Began, M.D. 04/24/2012 10:23 AM

## 2012-04-24 NOTE — H&P (Signed)
[name] is an 41 y.o. female.   Patient was seen and examined on April 24, 2012 at 11:30 PM. PCP - Dr. Billee Cashing. Chief Complaint: Nausea vomiting. HPI: 41 year-old female with known history of ESRD on hemodialysis on Monday Wednesday Friday was discharged home today morning after being treated for left AV graft bleeding presented back to the ER because of persistent nausea and vomiting. Patient states she has been having these symptoms for last 2 days with epigastric pain. Denies any chest pain or shortness of breath fever chills or any diarrhea. In the ER CT abdomen pelvis does not show any acute. Patient has been admitted for further management. Patient was found to be having leukocytosis and mild fever.  Past Medical History  Diagnosis Date  . GERD (gastroesophageal reflux disease)   . Diabetes mellitus     IDDM  . Hypertension     states has been on med. x "a long time"  . ESRF (end stage renal failure)     dialysis M,W, F; left thigh AV graft  . History of gangrene     left foot  . Hx MRSA infection   . 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 Av fistula placement 10/18/2008    right thigh AV graft  . Finger debridement 06/20/2010    right middle finger  . Finger amputation 05/27/2010    right middle  . Thrombectomy / arteriovenous graft revision 01/14/2010    left superficial femoral artery; left thigh AV graft placement  . 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 Leg amputation below knee 10/11/2009    right  . Central venous catheter insertion 08/06/2009    removal right IJ Diatek cath., placement left IJ Diatek cath.  . 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 Foot amputation 04/04/2009    left transtibial amputation  . Excision / curettage bone cyst talus / calcaneus 03/04/2009    partial calcaneal exc. left; placement wound VAC  . Groin debridement 11/16/2008    right rectus femoris muscle flap  to right groin wound; right groing debridement  . Femoral endarterectomy 11/06/2008    right common femoral; embolectomy right femoral artery; removal right femoral AV graft  . Central venous catheter insertion 07/10/2008; 12/31/2006; 01/19/2006; 06/19/2005; 09/19/2004; 09/08/2004    right IJ Diatek catheter  . Av fistula repair 07/08/2008    removal left upper arm AV graft  . Thrombectomy / arteriovenous graft revision 05/11/2007;12/31/2006; 01/18/2006; 12/16/2005    left upper arm  . Dialysis fistula creation 01/20/2007    left upper arm AV graft  . Dialysis fistula creation 01/28/2006    right upper arm AV graft  . Thrombectomy / arteriovenous graft revision 10/26/2005    left forearm AV graft  . Dialysis fistula creation 10/04/2005    left forearm AV graft  . Av fistula repair 07/14/2005    removal infected right forearm AV graft  . Thrombectomy / arteriovenous graft revision 03/11/2005; 10/20/2004    right forearm AV graft  . Dialysis fistula creation 09/10/2004    right forearm AV graft  . Av fistula placement 07/18/2004    creation left AV fistula, radial to cephalic  . Finger exploration 07/13/2002    and repair left middle finger  . Carpal tunnel release 01/26/2012    Procedure: CARPAL TUNNEL RELEASE;  Surgeon: Nicki Reaper, MD;  Location: [facility];  Service: Orthopedics;  Laterality: Left;    Family History  Problem Relation Age of Onset  . Diabetes Mother   . Heart disease Mother   . Hypertension Mother   . Heart attack Mother 73  . Diabetes Sister   . Hypertension Brother   . Heart attack Brother    Social History:  reports that she has never smoked. She has never used smokeless tobacco. She reports that she does not drink alcohol or use illicit drugs.  Allergies:  Allergies  Allergen Reactions  . Percocet (Oxycodone-Acetaminophen) Other (See Comments)    GI UPSET  . Soap Itching    IVORY SOAP     (Not in a hospital admission)  Results for orders placed  during the hospital encounter of 04/24/12 (from the past 48 hour(s))  CBC WITH DIFFERENTIAL     Status: Abnormal   Collection Time   04/24/12  7:00 PM      Component Value Range Comment   WBC 23.8 (*) 4.0 - 10.5 K/uL    RBC 2.87 (*) 3.87 - 5.11 MIL/uL    Hemoglobin 8.3 (*) 12.0 - 15.0 g/dL    HCT 62.1 (*) 30.8 - 46.0 %    MCV 81.5  78.0 - 100.0 fL    MCH 28.9  26.0 - 34.0 pg    MCHC 35.5  30.0 - 36.0 g/dL    RDW 65.7  84.6 - 96.2 %    Platelets 170  150 - 400 K/uL    Neutrophils Relative 86 (*) 43 - 77 %    Neutro Abs 20.5 (*) 1.7 - 7.7 K/uL    Lymphocytes Relative 7 (*) 12 - 46 %    Lymphs Abs 1.7  0.7 - 4.0 K/uL    Monocytes Relative 7  3 - 12 %    Monocytes Absolute 1.6 (*) 0.1 - 1.0 K/uL    Eosinophils Relative 0  0 - 5 %    Eosinophils Absolute 0.0  0.0 - 0.7 K/uL    Basophils Relative 0  0 - 1 %    Basophils Absolute 0.0  0.0 - 0.1 K/uL   COMPREHENSIVE METABOLIC PANEL     Status: Abnormal   Collection Time   04/24/12  7:00 PM      Component Value Range Comment   Sodium 133 (*) 135 - 145 mEq/L    Potassium 3.9  3.5 - 5.1 mEq/L    Chloride 92 (*) 96 - 112 mEq/L    CO2 28  19 - 32 mEq/L    Glucose, Bld 278 (*) 70 - 99 mg/dL    BUN 27 (*) 6 - 23 mg/dL    Creatinine, Ser 9.52 (*) 0.50 - 1.10 mg/dL    Calcium 8.4  8.4 - 84.1 mg/dL    Total Protein 6.6  6.0 - 8.3 g/dL    Albumin 2.6 (*) 3.5 - 5.2 g/dL    AST 20  0 - 37 U/L    ALT 9  0 - 35 U/L    Alkaline Phosphatase 73  39 - 117 U/L    Total Bilirubin 0.2 (*) 0.3 - 1.2 mg/dL    GFR calc non Af Amer 7 (*) >90 mL/min    GFR calc Af Amer 8 (*) >90 mL/min   LIPASE, BLOOD     Status: Normal   Collection Time   04/24/12  7:00 PM      Component Value Range Comment   Lipase 21  11 -  59 U/L   LACTIC ACID, PLASMA     Status: Normal   Collection Time   04/24/12  7:00 PM      Component Value Range Comment   Lactic Acid, Venous 1.1  0.5 - 2.2 mmol/L    Ct Abdomen Pelvis Wo Contrast  04/24/2012  *RADIOLOGY REPORT*  Clinical  Data: Abdominal pain, nausea and vomiting.  CT ABDOMEN AND PELVIS WITHOUT CONTRAST  Technique:  Multidetector CT imaging of the abdomen and pelvis was performed following the standard protocol without intravenous contrast.  Comparison: CT abdomen and pelvis 06/30/2009.  Findings: Linear basilar atelectasis is seen bilaterally.  No pleural or pericardial effusion.  There may be a few tiny stones within the gallbladder but no CT evidence of cholecystitis is identified.  The liver, spleen, adrenal glands and pancreas are unremarkable.  There is marked renal atrophy bilaterally with cystic change and calcifications consistent with chronic renal failure.  The patient has extensive atherosclerotic vascular disease.  Uterus, adnexa and urinary bladder are unremarkable.  The stomach, small and large bowel and appendix all appear normal.  No lymphadenopathy or fluid is seen. There is no focal bony abnormality.  IMPRESSION:  1.  No acute finding. 2.  Possible tiny gallstones.  No evidence of cholecystitis. 3.  Findings consistent with chronic renal failure.   Original Report Authenticated By: Bernadene Bell. Maricela Curet, M.D.     Review of Systems  Constitutional: Negative.   HENT: Negative.   Eyes: Negative.   Respiratory: Negative.   Cardiovascular: Negative.   Gastrointestinal: Positive for nausea and vomiting.  Genitourinary: Negative.   Musculoskeletal: Negative.   Skin: Negative.   Neurological: Negative.   Endo/Heme/Allergies: Negative.   Psychiatric/Behavioral: Negative.     Blood pressure 109/63, pulse 101, temperature 98.8 F (37.1 C), temperature source Oral, resp. rate 18, SpO2 100.00%. Physical Exam  Constitutional: She is oriented to person, place, and time. She appears well-developed and well-nourished. No distress.  HENT:  Head: Normocephalic and atraumatic.  Right Ear: External ear normal.  Left Ear: External ear normal.  Nose: Nose normal.  Mouth/Throat: Oropharynx is clear and moist. No  oropharyngeal exudate.  Eyes: Right eye exhibits no discharge. Left eye exhibits no discharge. No scleral icterus.  Neck: Normal range of motion. Neck supple.  Cardiovascular: Regular rhythm.        Mild tachycardia.  Respiratory: Effort normal and breath sounds normal. No respiratory distress. She has no wheezes. She has no rales.  GI: Soft. Bowel sounds are normal. She exhibits no distension. There is no tenderness. There is no rebound.  Musculoskeletal:       Bilateral AKA. No active bleeding from the surgical site.  Neurological: She is alert and oriented to person, place, and time.       Moves all extremities.  Skin: She is not diaphoretic.  Psychiatric: Her behavior is normal.     Assessment/Plan #1. Nausea and vomiting - CT does not show any acute abdomen. Most likely cause would be diabetic gastroparesis. But given the CT finding of possibly gallstones I have ordered a sonogram of abdomen. #2. Leukocytosis with mild fever - concerning for developing sepsis. Source could be from recent AV graft bleeding site. I have ordered chest x-ray to make sure there is no infiltrates. Blood cultures have been ordered. Patient has been empirically started on vancomycin and Fortaz. Reviewing nephrologist notes from early today before discharge patient was mildly febrile up to she got PRBC transfusion and was about to get antibiotics  for tomorrow at the dialysis. #3. ESRD on hemodialysis - on Monday Wednesdays and Fridays. Is not short of breath at this time. Closely monitor. Patient did get 250 cc normal saline bolus in the ER as ordered by the ER physician for possible dehydration. #4. Diabetes mellitus on insulin - we will continue home dose of insulin. Hold off metformin. Closely follow CBGs with sliding-scale. #5. Anemia probably from chronic kidney disease - closely follow CBC as patient had recent AV graft bleeding. #6. Was discharged today after being treated for AV graft bleeding.  CODE  STATUS - full code.  Eduard Clos. 04/24/2012, 11:42 PM

## 2012-04-24 NOTE — Anesthesia Preprocedure Evaluation (Signed)
Anesthesia Evaluation  Patient identified by MRN, date of birth, ID band Patient awake    Reviewed: Allergy & Precautions, H&P , NPO status , Patient's Chart, lab work & pertinent test results  History of Anesthesia Complications Negative for: history of anesthetic complications  Airway Mallampati: II TM Distance: >3 FB Neck ROM: Full    Dental  (+) Edentulous Upper and Edentulous Lower   Pulmonary neg pulmonary ROS,  breath sounds clear to auscultation  Pulmonary exam normal       Cardiovascular hypertension, Pt. on medications + Peripheral Vascular Disease (s/p B AKA) Rhythm:Regular Rate:Tachycardia     Neuro/Psych negative neurological ROS     GI/Hepatic GERD-  Medicated and Controlled,  Endo/Other  Type 2, Insulin Dependent and Oral Hypoglycemic AgentsMorbid obesity  Renal/GU ESRF and DialysisRenal disease (dialysis MWF, K+ 3.7)     Musculoskeletal   Abdominal (+) + obese,   Peds  Hematology  (+) Blood dyscrasia (Hb 6.2), anemia ,   Anesthesia Other Findings   Reproductive/Obstetrics                           Anesthesia Physical Anesthesia Plan  ASA: IV and Emergent  Anesthesia Plan: MAC   Post-op Pain Management:    Induction:   Airway Management Planned: Simple Face Mask  Additional Equipment:   Intra-op Plan:   Post-operative Plan:   Informed Consent: I have reviewed the patients History and Physical, chart, labs and discussed the procedure including the risks, benefits and alternatives for the proposed anesthesia with the patient or authorized representative who has indicated his/her understanding and acceptance.     Plan Discussed with: CRNA and Surgeon  Anesthesia Plan Comments: (Plan routine monitors, MAC with local by surgeon)        Anesthesia Quick Evaluation

## 2012-04-24 NOTE — ED Notes (Signed)
Medications sent to pharmacy

## 2012-04-24 NOTE — ED Provider Notes (Signed)
History     CSN: [id number]  Arrival date & time 04/24/12  1615   First MD Initiated Contact with Patient 04/24/12 1757      Chief Complaint  Patient presents with  . Emesis    (Consider location/radiation/quality/duration/timing/severity/associated sxs/prior treatment) The history is provided by the patient.  [name] is a 41 y.o. female hx of DM, ESRD on HD (last HD 2 days ago), HTN, s/p bilateral AKA here with vomiting. She came in the morning for bleeding from the dialysis graft. She went to the OR for bleeding control. After she went home, she had some vomiting and diffuse abdominal pain. Unable to tolerate PO since AM. + fevers at home. No cough and patient doesn't urinate.    Past Medical History  Diagnosis Date  . GERD (gastroesophageal reflux disease)   . Diabetes mellitus     IDDM  . Hypertension     states has been on med. x "a long time"  . ESRF (end stage renal failure)     dialysis M,W, F; left thigh AV graft  . History of gangrene     left foot  . Hx MRSA infection   . 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 Av fistula placement 10/18/2008    right thigh AV graft  . Finger debridement 06/20/2010    right middle finger  . Finger amputation 05/27/2010    right middle  . Thrombectomy / arteriovenous graft revision 01/14/2010    left superficial femoral artery; left thigh AV graft placement  . 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 Leg amputation below knee 10/11/2009    right  . Central venous catheter insertion 08/06/2009    removal right IJ Diatek cath., placement left IJ Diatek cath.  . 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 Foot amputation 04/04/2009    left transtibial amputation  . Excision / curettage bone cyst talus / calcaneus 03/04/2009    partial calcaneal exc. left; placement wound VAC  . Groin debridement 11/16/2008    right rectus femoris muscle flap to right groin wound;  right groing debridement  . Femoral endarterectomy 11/06/2008    right common femoral; embolectomy right femoral artery; removal right femoral AV graft  . Central venous catheter insertion 07/10/2008; 12/31/2006; 01/19/2006; 06/19/2005; 09/19/2004; 09/08/2004    right IJ Diatek catheter  . Av fistula repair 07/08/2008    removal left upper arm AV graft  . Thrombectomy / arteriovenous graft revision 05/11/2007;12/31/2006; 01/18/2006; 12/16/2005    left upper arm  . Dialysis fistula creation 01/20/2007    left upper arm AV graft  . Dialysis fistula creation 01/28/2006    right upper arm AV graft  . Thrombectomy / arteriovenous graft revision 10/26/2005    left forearm AV graft  . Dialysis fistula creation 10/04/2005    left forearm AV graft  . Av fistula repair 07/14/2005    removal infected right forearm AV graft  . Thrombectomy / arteriovenous graft revision 03/11/2005; 10/20/2004    right forearm AV graft  . Dialysis fistula creation 09/10/2004    right forearm AV graft  . Av fistula placement 07/18/2004    creation left AV fistula, radial to cephalic  . Finger exploration 07/13/2002    and repair left middle finger  . Carpal tunnel release 01/26/2012    Procedure: CARPAL TUNNEL RELEASE;  Surgeon: Nicki Reaper, MD;  Location: [facility];  Service: Orthopedics;  Laterality: Left;    Family History  Problem Relation Age of Onset  . Diabetes Mother   . Heart disease Mother   . Hypertension Mother   . Heart attack Mother 75  . Diabetes Sister   . Hypertension Brother   . Heart attack Brother     History  Substance Use Topics  . Smoking status: Never Smoker   . Smokeless tobacco: Never Used  . Alcohol Use: No    OB History    Grav Para Term Preterm Abortions TAB SAB Ect Mult Living                  Review of Systems  Gastrointestinal: Positive for nausea, vomiting and abdominal pain.  All other systems reviewed and are negative.    Allergies  Percocet and  Soap  Home Medications   Current Outpatient Rx  Name Route Sig Dispense Refill  . ASPIRIN EC 81 MG PO TBEC Oral Take 81 mg by mouth daily.    Marland Kitchen CALCIUM ACETATE 667 MG PO CAPS Oral Take by mouth 3 (three) times daily with meals.     Marland Kitchen HYDROXYZINE HCL 25 MG PO TABS Oral Take 25 mg by mouth 3 (three) times daily as needed. For dizziness    . IBUPROFEN 200 MG PO TABS Oral Take 200 mg by mouth every 6 (six) hours as needed. pain    . INSULIN ASPART 100 UNIT/ML [employer] SOLN Subcutaneous Inject 4 Units into the skin 3 (three) times daily before meals. SLIDING SCALE    . INSULIN ASPART PROT & ASPART (70-30) 100 UNIT/ML [place] SUSP Subcutaneous Inject 20 Units into the skin daily with breakfast. TAKE 20 UNITS IN THE MORNING AND 18 UNITS AT NIGHT    . METFORMIN HCL 500 MG PO TABS Oral Take 500 mg by mouth 2 (two) times daily with a meal.    . METOCLOPRAMIDE HCL 10 MG PO TABS Oral Take 5 mg by mouth 3 (three) times daily as needed.     Marland Kitchen NAPHAZOLINE-GLYCERIN 0.012-0.2 % OP SOLN Both Eyes Place 1-2 drops into both eyes every 4 (four) hours as needed. Irritated eyes    . OLOPATADINE HCL 0.1 % OP SOLN Both Eyes Place 1 drop into both eyes 2 (two) times daily.    Marland Kitchen OMEPRAZOLE 20 MG PO CPDR Oral Take 20 mg by mouth 2 (two) times daily.    . TRAMADOL HCL 50 MG PO TABS Oral Take 50 mg by mouth every 6 (six) hours as needed. For pain    . TRIAMCINOLONE ACETONIDE 0.1 % EX CREA Topical Apply 1 application topically 2 (two) times daily. Apply to rashy areas 2 or 3 times daily    . POLYMYXIN B-TRIMETHOPRIM 10000-0.1 UNIT/ML-% OP SOLN Left Eye Place 1 drop into the left eye every 4 (four) hours.      BP 120/56  Pulse 102  Temp 100.3 F (37.9 C) (Oral)  Resp 18  SpO2 100%  Physical Exam  Nursing note and vitals reviewed. Constitutional: She is oriented to person, place, and time.       Uncomfortable  HENT:  Head: Normocephalic.       OP slightly dry  Eyes: Conjunctivae and EOM are normal. Pupils are equal, round,  and reactive to light.  Neck: Normal range of motion. Neck supple.  Cardiovascular:       + tachy  Pulmonary/Chest: Effort normal and breath sounds normal.  Abdominal:       +  BS, tenderness in periumbilical area but no rebound.   Musculoskeletal: Normal range of motion. She exhibits no edema.       Bilateral AKA   Neurological: She is alert and oriented to person, place, and time.  Skin: Skin is warm and dry.  Psychiatric: She has a normal mood and affect. Her behavior is normal. Judgment and thought content normal.    ED Course  Procedures (including critical care time)  Labs Reviewed  CBC WITH DIFFERENTIAL - Abnormal; Notable for the following:    WBC 23.8 (*)     RBC 2.87 (*)     Hemoglobin 8.3 (*)     HCT 23.4 (*)     Neutrophils Relative 86 (*)     Neutro Abs 20.5 (*)     Lymphocytes Relative 7 (*)     Monocytes Absolute 1.6 (*)     All other components within normal limits  COMPREHENSIVE METABOLIC PANEL - Abnormal; Notable for the following:    Sodium 133 (*)     Chloride 92 (*)     Glucose, Bld 278 (*)     BUN 27 (*)     Creatinine, Ser 6.89 (*)     Albumin 2.6 (*)     Total Bilirubin 0.2 (*)     GFR calc non Af Amer 7 (*)     GFR calc Af Amer 8 (*)     All other components within normal limits  LIPASE, BLOOD  LACTIC ACID, PLASMA   Ct Abdomen Pelvis Wo Contrast  04/24/2012  *RADIOLOGY REPORT*  Clinical Data: Abdominal pain, nausea and vomiting.  CT ABDOMEN AND PELVIS WITHOUT CONTRAST  Technique:  Multidetector CT imaging of the abdomen and pelvis was performed following the standard protocol without intravenous contrast.  Comparison: CT abdomen and pelvis 06/30/2009.  Findings: Linear basilar atelectasis is seen bilaterally.  No pleural or pericardial effusion.  There may be a few tiny stones within the gallbladder but no CT evidence of cholecystitis is identified.  The liver, spleen, adrenal glands and pancreas are unremarkable.  There is marked renal atrophy  bilaterally with cystic change and calcifications consistent with chronic renal failure.  The patient has extensive atherosclerotic vascular disease.  Uterus, adnexa and urinary bladder are unremarkable.  The stomach, small and large bowel and appendix all appear normal.  No lymphadenopathy or fluid is seen. There is no focal bony abnormality.  IMPRESSION:  1.  No acute finding. 2.  Possible tiny gallstones.  No evidence of cholecystitis. 3.  Findings consistent with chronic renal failure.   Original Report Authenticated By: Bernadene Bell. Maricela Curet, M.D.      No diagnosis found.    MDM  [name] is a 42 y.o. female hx of ESRD on HD here with dehydration from vomiting and also abdominal pain. Differential includes SBO, anesthesia side effects, and mesenteric ischemia vs viral syndrome. Will check CBC, CMP, lipase, lactate. Will also do CT ab/pel given that patient is tender. Will give gentle hydration and re-evaluate.   10:29 PM Labs showed WBC 23, CMP at baseline, lactate and lipase nl. CT ab/pel showed small gallstones and no acute findings. Patient feels minimally better. I discussed with Dr. Toniann Fail from [facility], who accepted the patient on tele. He recommend blood culture and hold off on abx.        Richardean Canal, MD 04/24/12 2231

## 2012-04-24 NOTE — ED Provider Notes (Signed)
History     CSN: [id number]  Arrival date & time 04/24/12  0613   First MD Initiated Contact with Patient 04/24/12 [phone]      Chief Complaint  Patient presents with  . Vascular Access Problem    (Consider location/radiation/quality/duration/timing/severity/associated sxs/prior treatment) Patient is a 41 y.o. female presenting with weakness. The history is provided by the patient.  Weakness Primary symptoms do not include fever. Primary symptoms comment: Bleeding from dialysis graft left thigh for 2 days. Now weak and lethargic. The symptoms are worsening. The neurological symptoms are diffuse.  Additional symptoms include weakness. Associated symptoms comments: She has had dialysis grafts to both arms and both legs (also a bilateral AKA) with all grafts failing due to thrombosis except left thigh graft. She last dialyzed Friday (2 days ago) and reports bleeding since that time from the graft. .    Past Medical History  Diagnosis Date  . GERD (gastroesophageal reflux disease)   . Diabetes mellitus     IDDM  . Hypertension     states has been on med. x "a long time"  . ESRF (end stage renal failure)     dialysis M,W, F; left thigh AV graft  . History of gangrene     left foot  . Hx MRSA infection   . 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 Av fistula placement 10/18/2008    right thigh AV graft  . Finger debridement 06/20/2010    right middle finger  . Finger amputation 05/27/2010    right middle  . Thrombectomy / arteriovenous graft revision 01/14/2010    left superficial femoral artery; left thigh AV graft placement  . 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 Leg amputation below knee 10/11/2009    right  . Central venous catheter insertion 08/06/2009    removal right IJ Diatek cath., placement left IJ Diatek cath.  . 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 Foot amputation 04/04/2009    left transtibial  amputation  . Excision / curettage bone cyst talus / calcaneus 03/04/2009    partial calcaneal exc. left; placement wound VAC  . Groin debridement 11/16/2008    right rectus femoris muscle flap to right groin wound; right groing debridement  . Femoral endarterectomy 11/06/2008    right common femoral; embolectomy right femoral artery; removal right femoral AV graft  . Central venous catheter insertion 07/10/2008; 12/31/2006; 01/19/2006; 06/19/2005; 09/19/2004; 09/08/2004    right IJ Diatek catheter  . Av fistula repair 07/08/2008    removal left upper arm AV graft  . Thrombectomy / arteriovenous graft revision 05/11/2007;12/31/2006; 01/18/2006; 12/16/2005    left upper arm  . Dialysis fistula creation 01/20/2007    left upper arm AV graft  . Dialysis fistula creation 01/28/2006    right upper arm AV graft  . Thrombectomy / arteriovenous graft revision 10/26/2005    left forearm AV graft  . Dialysis fistula creation 10/04/2005    left forearm AV graft  . Av fistula repair 07/14/2005    removal infected right forearm AV graft  . Thrombectomy / arteriovenous graft revision 03/11/2005; 10/20/2004    right forearm AV graft  . Dialysis fistula creation 09/10/2004    right forearm AV graft  . Av fistula placement 07/18/2004    creation left AV fistula, radial to cephalic  . Finger exploration 07/13/2002    and repair left middle  finger  . Carpal tunnel release 01/26/2012    Procedure: CARPAL TUNNEL RELEASE;  Surgeon: Nicki Reaper, MD;  Location: [facility];  Service: Orthopedics;  Laterality: Left;    Family History  Problem Relation Age of Onset  . Diabetes Mother   . Heart disease Mother   . Hypertension Mother   . Heart attack Mother 20  . Diabetes Sister   . Hypertension Brother   . Heart attack Brother     History  Substance Use Topics  . Smoking status: Never Smoker   . Smokeless tobacco: Never Used  . Alcohol Use: No    OB History    Grav Para Term Preterm Abortions TAB SAB  Ect Mult Living                  Review of Systems  Constitutional: Negative for fever.  Respiratory: Negative for shortness of breath.   Cardiovascular: Negative for chest pain.  Gastrointestinal: Positive for abdominal pain.       She complains of abdominal pain and back pain, which is chronic.   Musculoskeletal: Positive for back pain.  Skin: Positive for pallor.  Neurological: Positive for weakness. Negative for syncope.    Allergies  Percocet and Soap  Home Medications   Current Outpatient Rx  Name Route Sig Dispense Refill  . ASPIRIN EC 81 MG PO TBEC Oral Take 81 mg by mouth daily.    Marland Kitchen CALCIUM ACETATE 667 MG PO CAPS Oral Take by mouth 3 (three) times daily with meals.     Marland Kitchen HYDROXYZINE HCL 25 MG PO TABS Oral Take 25 mg by mouth 3 (three) times daily as needed. For dizziness    . IBUPROFEN 200 MG PO TABS Oral Take 200 mg by mouth every 6 (six) hours as needed. pain    . INSULIN ASPART 100 UNIT/ML [place] SOLN Subcutaneous Inject 4 Units into the skin 3 (three) times daily before meals. SLIDING SCALE    . INSULIN ASPART PROT & ASPART (70-30) 100 UNIT/ML [place] SUSP Subcutaneous Inject 20 Units into the skin daily with breakfast. TAKE 20 UNITS IN THE MORNING AND 18 UNITS AT NIGHT    . METFORMIN HCL 500 MG PO TABS Oral Take 500 mg by mouth 2 (two) times daily with a meal.    . METOCLOPRAMIDE HCL 10 MG PO TABS Oral Take 5 mg by mouth 3 (three) times daily as needed.     Marland Kitchen NAPHAZOLINE-GLYCERIN 0.012-0.2 % OP SOLN Both Eyes Place 1-2 drops into both eyes every 4 (four) hours as needed. Irritated eyes    . OLOPATADINE HCL 0.1 % OP SOLN Both Eyes Place 1 drop into both eyes 2 (two) times daily.    Marland Kitchen OMEPRAZOLE 20 MG PO CPDR Oral Take 20 mg by mouth 2 (two) times daily.    . TRIAMCINOLONE ACETONIDE 0.1 % EX CREA Topical Apply 1 application topically 2 (two) times daily. Apply to rashy areas 2 or 3 times daily    . POLYMYXIN B-TRIMETHOPRIM 10000-0.1 UNIT/ML-% OP SOLN Left Eye Place 1 drop into  the left eye every 4 (four) hours.      BP 119/69  Pulse 119  Temp 98.7 F (37.1 C) (Oral)  Resp 15  SpO2 100%  Physical Exam  Constitutional: She is oriented to person, place, and time. She appears well-developed and well-nourished. No distress.  Cardiovascular: Tachycardia present.   Pulmonary/Chest: She has rales.  Abdominal: Soft. There is no tenderness.  Musculoskeletal:  Bilateral AKA. Graft anteromedial thigh has significant bleeding.   Neurological: She is oriented to person, place, and time.       Responsive, oriented, follows command, able to provide history.  Skin: Skin is warm. There is pallor.    ED Course  Procedures (including critical care time)   Labs Reviewed  PROTIME-INR  APTT  CBC WITH DIFFERENTIAL  TYPE AND SCREEN   Results for orders placed during the hospital encounter of 01/26/12  BASIC METABOLIC PANEL      Component Value Range   Sodium 134 (*) 135 - 145 mEq/L   Potassium 3.7  3.5 - 5.1 mEq/L   Chloride 91 (*) 96 - 112 mEq/L   CO2 27  19 - 32 mEq/L   Glucose, Bld 420 (*) 70 - 99 mg/dL   BUN 11  6 - 23 mg/dL   Creatinine, Ser 1.61 (*) 0.50 - 1.10 mg/dL   Calcium 9.9  8.4 - 09.6 mg/dL   GFR calc non Af Amer 18 (*) >90 mL/min   GFR calc Af Amer 21 (*) >90 mL/min  POCT I-STAT, CHEM 8      Component Value Range   Sodium 139  135 - 145 mEq/L   Potassium 4.7  3.5 - 5.1 mEq/L   Chloride 101  96 - 112 mEq/L   BUN 27 (*) 6 - 23 mg/dL   Creatinine, Ser 0.45 (*) 0.50 - 1.10 mg/dL   Glucose, Bld 409 (*) 70 - 99 mg/dL   Calcium, Ion 8.11  9.14 - 1.32 mmol/L   TCO2 30  0 - 100 mmol/L   Hemoglobin 11.9 (*) 12.0 - 15.0 g/dL   HCT 78.2 (*) 95.6 - 21.3 %  GLUCOSE, CAPILLARY      Component Value Range   Glucose-Capillary 293 (*) 70 - 99 mg/dL    No results found. CRITICAL CARE Performed by: Langley Adie A   Total critical care time: 40  Critical care time was exclusive of separately billable procedures and treating other  patients.  Critical care was necessary to treat or prevent imminent or life-threatening deterioration.  Critical care was time spent personally by me on the following activities: development of treatment plan with patient and/or surrogate as well as nursing, discussions with consultants, evaluation of patient's response to treatment, examination of patient, obtaining history from patient or surrogate, ordering and performing treatments and interventions, ordering and review of laboratory studies, ordering and review of radiographic studies, pulse oximetry and re-evaluation of patient's condition.   No diagnosis found. 1. Bleeding dialysis graft 2. Hypotension,    MDM  Records reviewed. Patient has long history of arterial access failures, multiple scars from previous central lines. Pressure dressing applied to bleeding left graft. Venous access extremely difficult but 22 gauge established in right upper extremity - fluids bolusing. Patient remains awake, c/o abdominal pain per her usual pain. No nausea. Blood pressure 76/54, HR 119. Discussed with Dr. Arbie Cookey who will be in to evaluate graft, stop bleeding.         Rodena Medin, PA-C 04/24/12 [phone]

## 2012-04-24 NOTE — Transfer of Care (Signed)
Immediate Anesthesia Transfer of Care Note  Patient: [name]  Procedure(s) Performed: Procedure(s) (LRB): THROMBECTOMY AND REVISION OF ARTERIOVENTOUS (AV) GORETEX  GRAFT (Left)  Patient Location: PACU  Anesthesia Type: MAC  Level of Consciousness: awake, alert , oriented and patient cooperative  Airway & Oxygen Therapy: Patient Spontanous Breathing and Patient connected to nasal cannula oxygen  Post-op Assessment: Report given to PACU RN and Post -op Vital signs reviewed and stable  Post vital signs: Reviewed and stable  Complications: No apparent anesthesia complications

## 2012-04-24 NOTE — Anesthesia Postprocedure Evaluation (Signed)
  Anesthesia Post-op Note  Patient: [name]  Procedure(s) Performed: Procedure(s) (LRB): THROMBECTOMY AND REVISION OF ARTERIOVENTOUS (AV) GORETEX  GRAFT (Left)  Patient Location: PACU  Anesthesia Type: MAC  Level of Consciousness: awake, alert  and oriented  Airway and Oxygen Therapy: Patient Spontanous Breathing and Patient connected to nasal cannula oxygen  Post-op Pain: none  Post-op Assessment: Post-op Vital signs reviewed, Patient's Cardiovascular Status Stable, Respiratory Function Stable, Patent Airway, No signs of Nausea or vomiting and Pain level controlled  Post-op Vital Signs: Reviewed and stable  Complications: No apparent anesthesia complications

## 2012-04-24 NOTE — H&P (Signed)
The patient presents today to the emergency room with bleeding from her left femoral loop AV Gore-Tex graft. She dialyzed successfully 48 hours ago and has had persistent bleeding since this time. This was uncontrollable in the emergency department and I was consult. She has had multiple access in both arms and both legs. This is been deemed her last access. She did angioplasty of the venous stenosis in March 2013.  Past Medical History  Diagnosis Date  . GERD (gastroesophageal reflux disease)   . Diabetes mellitus     IDDM  . Hypertension     states has been on med. x "a long time"  . ESRF (end stage renal failure)     dialysis M,W, F; left thigh AV graft  . History of gangrene     left foot  . Hx MRSA infection   . Carpal tunnel syndrome of left wrist 12/2011    History  Substance Use Topics  . Smoking status: Never Smoker   . Smokeless tobacco: Never Used  . Alcohol Use: No    Family History  Problem Relation Age of Onset  . Diabetes Mother   . Heart disease Mother   . Hypertension Mother   . Heart attack Mother 37  . Diabetes Sister   . Hypertension Brother   . Heart attack Brother     Allergies  Allergen Reactions  . Percocet (Oxycodone-Acetaminophen) Other (See Comments)    GI UPSET  . Soap Itching    IVORY SOAP    Current facility-administered medications:ondansetron (ZOFRAN) 4 MG/2ML injection, , , , ;  ondansetron (ZOFRAN) injection 4 mg, 4 mg, Intravenous, Once, Suzi Roots, MD, 4 mg at 04/24/12 0803 Current outpatient prescriptions:aspirin EC 81 MG tablet, Take 81 mg by mouth daily., Disp: , Rfl: ;  calcium acetate (PHOSLO) 667 MG capsule, Take by mouth 3 (three) times daily with meals. , Disp: , Rfl: ;  hydrOXYzine (ATARAX/VISTARIL) 25 MG tablet, Take 25 mg by mouth 3 (three) times daily as needed. For dizziness, Disp: , Rfl: ;  ibuprofen (ADVIL,MOTRIN) 200 MG tablet, Take 200 mg by mouth every 6 (six) hours as needed. pain, Disp: , Rfl:  insulin aspart  (NOVOLOG) 100 UNIT/ML injection, Inject 4 Units into the skin 3 (three) times daily before meals. SLIDING SCALE, Disp: , Rfl: ;  insulin aspart protamine-insulin aspart (NOVOLOG 70/30) (70-30) 100 UNIT/ML injection, Inject 20 Units into the skin daily with breakfast. TAKE 20 UNITS IN THE MORNING AND 18 UNITS AT NIGHT, Disp: , Rfl:  metFORMIN (GLUCOPHAGE) 500 MG tablet, Take 500 mg by mouth 2 (two) times daily with a meal., Disp: , Rfl: ;  metoCLOPramide (REGLAN) 10 MG tablet, Take 5 mg by mouth 3 (three) times daily as needed. , Disp: , Rfl: ;  naphazoline-glycerin (CLEAR EYES) 0.012-0.2 % SOLN, Place 1-2 drops into both eyes every 4 (four) hours as needed. Irritated eyes, Disp: , Rfl:  olopatadine (PATANOL) 0.1 % ophthalmic solution, Place 1 drop into both eyes 2 (two) times daily., Disp: , Rfl: ;  omeprazole (PRILOSEC) 20 MG capsule, Take 20 mg by mouth 2 (two) times daily., Disp: , Rfl: ;  triamcinolone cream (KENALOG) 0.1 %, Apply 1 application topically 2 (two) times daily. Apply to rashy areas 2 or 3 times daily, Disp: , Rfl:  trimethoprim-polymyxin b (POLYTRIM) ophthalmic solution, Place 1 drop into the left eye every 4 (four) hours., Disp: , Rfl:   BP 94/56  Pulse 117  Temp 98.7 F (37.1 C) (Oral)  Resp 19  SpO2 100%  There is no height or weight on file to calculate BMI.       Physical exam: Alert oriented with nausea and vomiting currently. Multiple prior accesses in both upper from these. She does have bilateral lower extremity amputation above-knee Chest clear bilaterally Heart regular rate and rhythm The area of access on the left on the medial portion of the loop has an area of erosion into the graft that has been actively bleeding. It is pulsatile directly under this  Hemoglobin 6.5  Impression and plan: Major bleed from left femoral AV graft. Patient was taken urgently to the operating room for control and revision

## 2012-04-24 NOTE — ED Notes (Signed)
Patient transported by [facility] for dialysis graft site bleeding in the left anterior thigh.  Patient is from home.  Also complaining of shortness of breath. Graft actively bleeding upon entry to ER, pressure dressing applied.

## 2012-04-24 NOTE — ED Notes (Signed)
MD at bedside. Dr. Arbie Cookey (vascular)

## 2012-04-24 NOTE — ED Notes (Signed)
Hgb- 6.2 ED MD made aware.

## 2012-04-24 NOTE — Consult Note (Signed)
Subjective:  41 yo bf with ESRD on hd mwf, presents with bleeding from left femoral avgg. No problems with HD on Friday. "yesterday oozing blood from avvg worsened "and called ems last night.   Taken to or by Dr. Arbie Cookey with False Aneurysm venous side replaced.  Now post op  In recovery alert, requesting something to drink . Objective Vital signs in last 24 hours: Filed Vitals:   04/24/12 0630 04/24/12 0715 04/24/12 0730 04/24/12 1019  BP:  119/69 94/56   Pulse:  119 117 110  Temp:    101.2 F (38.4 C)  TempSrc:      Resp: 17 15 19 19   SpO2:  100% 100% 100%   Weight change:   Intake/Output Summary (Last 24 hours) at 04/24/12 1042 Last data filed at 04/24/12 1027  Gross per 24 hour  Intake   1200 ml  Output    100 ml  Net   1100 ml   Labs: Basic Metabolic Panel:  Lab 04/24/12 9604 04/24/12 0732  NA 136 134*  K 4.0 3.7  CL -- 93*  CO2 -- --  GLUCOSE 415* 394*  BUN -- 21  CREATININE -- 5.70*  CALCIUM -- --  ALB -- --  PHOS -- --   Liver Function Tests: No results found for this basename: AST:3,ALT:3,ALKPHOS:3,BILITOT:3,PROT:3,ALBUMIN:3 in the last 168 hours No results found for this basename: LIPASE:3,AMYLASE:3 in the last 168 hours No results found for this basename: AMMONIA:3 in the last 168 hours CBC:  Lab 04/24/12 1027 04/24/12 0732 04/24/12 0707  WBC -- -- 33.5*  NEUTROABS -- -- 29.9*  HGB 9.5* 7.5* 6.2*  HCT 28.0* 22.0* 17.6*  MCV -- -- 85.4  PLT -- -- 211   Cardiac Enzymes: No results found for this basename: CKTOTAL:5,CKMB:5,CKMBINDEX:5,TROPONINI:5 in the last 168 hours CBG: No results found for this basename: GLUCAP:5 in the last 168 hours  Iron Studies: No results found for this basename: IRON,TIBC,TRANSFERRIN,FERRITIN in the last 72 hours Studies/Results: No results found. Medications:      . insulin aspart  4 Units Subcutaneous Once  . ondansetron      . ondansetron (ZOFRAN) IV  4 mg Intravenous Once  . pantoprazole  40 mg Oral Q1200   I   have reviewed scheduled and prn medications.  Physical Exam: General: Alert, obese BF NAD ,in recovery room, cooperative Heart: RRR, 2/6 sem lsb Lungs: Slightly decreased at bases otherwise clear Abdomen: Mod. Obese, bs positive, soft nontender Liver down 5 cm Extremities: Dialysis Access: bilat  AKA, Left femoral avgg pos. Bruit  , with surgical bandage clear and dry.   Problem/Plan: 1. Left Femoral Avgg bleed from  False Aneurysm sp replacement venous  Side  By Dr. Arbie Cookey stable for dc home per Dr. Arbie Cookey 2. ESRD - k 4.0, vol  Okay next hd in am [facility] . I will notify [facility] of surgery and cannulation okay at avgg site per Dr. Arbie Cookey 3. Anemia - Hgb 6.2 on admit  Now 9.5 after transfused rbc 2 units 4. Secondary hyperparathyroidism -binder and vitd no change 5. HTN/volume - stable 6. Fever / wbc elevation = afebrile in er , fever after transfusion,  Per Dr. Arbie Cookey no cultures taken  , will dose Vancomycin and Fortaz at [facility]  7 DM= Glucose 394 and 415  , Anesthesia  Giving  Regular Insulin/  Lenny Pastel, PA-C [facility] [phone] 04/24/2012,10:42 AM  LOS: 0 days I have seen and examined this patient and  agree with the plan of care seen ,examined, eval.  Will follow for fevers/infx, prob blood.  And WBC^ probably bleeding, but cover with AB .  Shanitra Phillippi L 04/24/2012, 11:45 AM

## 2012-04-24 NOTE — ED Notes (Signed)
OR ready.  Consent signed.

## 2012-04-24 NOTE — ED Notes (Signed)
Blood consent signed by patient.  Warm blankets given.

## 2012-04-24 NOTE — ED Notes (Signed)
Family called for patient, no answer

## 2012-04-24 NOTE — ED Provider Notes (Signed)
Medical screening examination/treatment/procedure(s) were performed by non-physician practitioner and as supervising physician I was immediately available for consultation/collaboration.    Ratasha Fabre, MD 04/24/12 0900 

## 2012-04-24 NOTE — ED Notes (Signed)
To ED for eval of fever and vomiting. States she hasn't been feeling well for a cple days. States she started coughing this am and her dialysis graft site started to bleed. Fixed this am by Dr Darrick Penna. Pt states while in PACU the staff wanted her to stay in hospital. Since going home around 1pm she has been vomiting and 'still doesn't feel good'.

## 2012-04-25 ENCOUNTER — Inpatient Hospital Stay (HOSPITAL_COMMUNITY): Payer: Medicare Other

## 2012-04-25 DIAGNOSIS — R112 Nausea with vomiting, unspecified: Secondary | ICD-10-CM

## 2012-04-25 LAB — CBC WITH DIFFERENTIAL/PLATELET
Eosinophils Absolute: 0 10*3/uL (ref 0.0–0.7)
Eosinophils Relative: 0 % (ref 0–5)
Hemoglobin: 8.2 g/dL — ABNORMAL LOW (ref 12.0–15.0)
Lymphocytes Relative: 10 % — ABNORMAL LOW (ref 12–46)
Lymphs Abs: 2.1 10*3/uL (ref 0.7–4.0)
MCH: 29.4 pg (ref 26.0–34.0)
MCV: 81.4 fL (ref 78.0–100.0)
Monocytes Relative: 5 % (ref 3–12)
Platelets: 186 10*3/uL (ref 150–400)
RBC: 2.79 MIL/uL — ABNORMAL LOW (ref 3.87–5.11)
WBC: 20.6 10*3/uL — ABNORMAL HIGH (ref 4.0–10.5)

## 2012-04-25 LAB — COMPREHENSIVE METABOLIC PANEL
AST: 20 U/L (ref 0–37)
Albumin: 2.6 g/dL — ABNORMAL LOW (ref 3.5–5.2)
BUN: 35 mg/dL — ABNORMAL HIGH (ref 6–23)
Calcium: 8.5 mg/dL (ref 8.4–10.5)
Creatinine, Ser: 7.45 mg/dL — ABNORMAL HIGH (ref 0.50–1.10)
Total Protein: 6.7 g/dL (ref 6.0–8.3)

## 2012-04-25 LAB — GLUCOSE, CAPILLARY
Glucose-Capillary: 115 mg/dL — ABNORMAL HIGH (ref 70–99)
Glucose-Capillary: 333 mg/dL — ABNORMAL HIGH (ref 70–99)

## 2012-04-25 LAB — MRSA PCR SCREENING: MRSA by PCR: POSITIVE — AB

## 2012-04-25 MED ORDER — DEXTROSE 5 % IV SOLN
2.0000 g | Freq: Once | INTRAVENOUS | Status: AC
  Administered 2012-04-25: 2 g via INTRAVENOUS
  Filled 2012-04-25: qty 2

## 2012-04-25 MED ORDER — SODIUM CHLORIDE 0.9 % IV SOLN
125.0000 mg | INTRAVENOUS | Status: DC
  Administered 2012-04-27: 125 mg via INTRAVENOUS
  Filled 2012-04-25: qty 10

## 2012-04-25 MED ORDER — INSULIN ASPART 100 UNIT/ML ~~LOC~~ SOLN
5.0000 [IU] | Freq: Once | SUBCUTANEOUS | Status: AC
  Administered 2012-04-25: 5 [IU] via SUBCUTANEOUS

## 2012-04-25 MED ORDER — DARBEPOETIN ALFA-POLYSORBATE 100 MCG/0.5ML IJ SOLN
100.0000 ug | INTRAMUSCULAR | Status: DC
  Administered 2012-04-25: 100 ug via INTRAVENOUS
  Filled 2012-04-25: qty 0.5

## 2012-04-25 MED ORDER — PARICALCITOL 5 MCG/ML IV SOLN
INTRAVENOUS | Status: AC
  Administered 2012-04-25: 6 ug via INTRAVENOUS
  Filled 2012-04-25: qty 1

## 2012-04-25 MED ORDER — RENA-VITE PO TABS
1.0000 | ORAL_TABLET | Freq: Every day | ORAL | Status: DC
  Administered 2012-04-25 – 2012-04-28 (×4): 1 via ORAL
  Filled 2012-04-25 (×5): qty 1

## 2012-04-25 MED ORDER — INSULIN ASPART 100 UNIT/ML ~~LOC~~ SOLN
0.0000 [IU] | Freq: Three times a day (TID) | SUBCUTANEOUS | Status: DC
  Administered 2012-04-26: 4 [IU] via SUBCUTANEOUS
  Administered 2012-04-26: 3 [IU] via SUBCUTANEOUS
  Administered 2012-04-27: 4 [IU] via SUBCUTANEOUS
  Administered 2012-04-28: 7 [IU] via SUBCUTANEOUS

## 2012-04-25 MED ORDER — PARICALCITOL 5 MCG/ML IV SOLN
6.0000 ug | INTRAVENOUS | Status: DC
  Administered 2012-04-25 – 2012-04-29 (×3): 6 ug via INTRAVENOUS
  Filled 2012-04-25 (×2): qty 1.2

## 2012-04-25 MED ORDER — CHLORHEXIDINE GLUCONATE CLOTH 2 % EX PADS
6.0000 | MEDICATED_PAD | Freq: Every day | CUTANEOUS | Status: DC
  Administered 2012-04-25: 6 via TOPICAL

## 2012-04-25 MED ORDER — NAPHAZOLINE-PHENIRAMINE 0.025-0.3 % OP SOLN
1.0000 [drp] | OPHTHALMIC | Status: DC | PRN
  Filled 2012-04-25: qty 15

## 2012-04-25 MED ORDER — VANCOMYCIN HCL IN DEXTROSE 1-5 GM/200ML-% IV SOLN
1000.0000 mg | INTRAVENOUS | Status: DC
  Administered 2012-04-25 – 2012-04-27 (×2): 1000 mg via INTRAVENOUS
  Filled 2012-04-25 (×3): qty 200

## 2012-04-25 MED ORDER — INSULIN ASPART PROT & ASPART (70-30 MIX) 100 UNIT/ML ~~LOC~~ SUSP
20.0000 [IU] | Freq: Every day | SUBCUTANEOUS | Status: DC
  Administered 2012-04-25 – 2012-04-26 (×2): 20 [IU] via SUBCUTANEOUS
  Filled 2012-04-25: qty 3

## 2012-04-25 MED ORDER — INSULIN ASPART 100 UNIT/ML ~~LOC~~ SOLN: 0.0000 [IU] | Freq: Every day | SUBCUTANEOUS | Status: DC

## 2012-04-25 MED ORDER — INSULIN ASPART PROT & ASPART (70-30 MIX) 100 UNIT/ML ~~LOC~~ SUSP
18.0000 [IU] | Freq: Every day | SUBCUTANEOUS | Status: DC
  Administered 2012-04-25 – 2012-04-26 (×2): 18 [IU] via SUBCUTANEOUS

## 2012-04-25 MED ORDER — DARBEPOETIN ALFA-POLYSORBATE 100 MCG/0.5ML IJ SOLN
INTRAMUSCULAR | Status: AC
  Administered 2012-04-25: 100 ug via INTRAVENOUS
  Filled 2012-04-25: qty 0.5

## 2012-04-25 MED ORDER — MUPIROCIN 2 % EX OINT
1.0000 "application " | TOPICAL_OINTMENT | Freq: Two times a day (BID) | CUTANEOUS | Status: AC
  Administered 2012-04-25 – 2012-04-29 (×10): 1 via NASAL
  Filled 2012-04-25: qty 22

## 2012-04-25 MED ORDER — VANCOMYCIN HCL 1000 MG IV SOLR
2000.0000 mg | Freq: Once | INTRAVENOUS | Status: AC
  Administered 2012-04-25: 2000 mg via INTRAVENOUS
  Filled 2012-04-25: qty 2000

## 2012-04-25 MED ORDER — DIPHENHYDRAMINE HCL 25 MG PO CAPS
25.0000 mg | ORAL_CAPSULE | Freq: Three times a day (TID) | ORAL | Status: DC | PRN
  Administered 2012-04-25: 25 mg via ORAL
  Filled 2012-04-25: qty 1

## 2012-04-25 MED ORDER — DEXTROSE 5 % IV SOLN
2.0000 g | INTRAVENOUS | Status: DC
  Administered 2012-04-25 – 2012-04-29 (×3): 2 g via INTRAVENOUS
  Filled 2012-04-25 (×4): qty 2

## 2012-04-25 MED FILL — Fentanyl Citrate Inj 0.05 MG/ML: INTRAMUSCULAR | Qty: 2 | Status: AC

## 2012-04-25 MED FILL — Promethazine HCl Inj 25 MG/ML: INTRAMUSCULAR | Qty: 1 | Status: AC

## 2012-04-25 NOTE — Progress Notes (Signed)
Received patient from ED,pt admitted with c/o nausea and vomiting.Patient is alert and oriented.Placed on telemetry.Left thigh AVG with dressing that's stained marked,no active bleeding noted.Left external jugular iv site intact and flushes well.Patient has bilateral AKA.Right inner thigh has an open area that looks like a small boil?,no drainage noted.Patient oriented to room and unit routines.Call bell within reach.Bed alarm on.Will continue to monitor. Abiageal Blowe [name],RN

## 2012-04-25 NOTE — Progress Notes (Signed)
ANTIBIOTIC CONSULT NOTE - INITIAL  Pharmacy Consult for vancomycin, ceftazidime Indication: empiric   Allergies  Allergen Reactions  . Percocet (Oxycodone-Acetaminophen) Other (See Comments)    GI UPSET  . Soap Itching    IVORY SOAP    Patient Measurements: Weight: 192 lb 14.4 oz (87.5 kg)  Vital Signs: Temp: 98.8 F (37.1 C) (08/25 2359) Temp src: Oral (08/25 2359) BP: 105/68 mmHg (08/25 2359) Pulse Rate: 98  (08/25 2359) Intake/Output from previous day:   Intake/Output from this shift:    Labs:  Basename 04/24/12 1900 04/24/12 1027 04/24/12 0732 04/24/12 0707  WBC 23.8* -- -- 33.5*  HGB 8.3* 9.5* 7.5* --  PLT 170 -- -- 211  LABCREA -- -- -- --  CREATININE 6.89* -- 5.70* --   The CrCl is unknown because both a height and weight (above a minimum accepted value) are required for this calculation. No results found for this basename: VANCOTROUGH:2,VANCOPEAK:2,VANCORANDOM:2,GENTTROUGH:2,GENTPEAK:2,GENTRANDOM:2,TOBRATROUGH:2,TOBRAPEAK:2,TOBRARND:2,AMIKACINPEAK:2,AMIKACINTROU:2,AMIKACIN:2, in the last 72 hours   Microbiology: No results found for this or any previous visit (from the past 720 hour(s)).  Medical History: Past Medical History  Diagnosis Date  . GERD (gastroesophageal reflux disease)   . Diabetes mellitus     IDDM  . Hypertension     states has been on med. x "a long time"  . ESRF (end stage renal failure)     dialysis M,W, F; left thigh AV graft  . History of gangrene     left foot  . Hx MRSA infection   . 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 acetaminophen  650 mg Oral Once  . aspirin EC  81 mg Oral Daily  . calcium acetate  667 mg Oral TID WC  . insulin aspart  0-9 Units Subcutaneous TID WC  . insulin aspart protamine-insulin aspart  18 Units Subcutaneous Q supper  . insulin aspart protamine-insulin aspart  20 Units Subcutaneous Q breakfast  . lidocaine  2 mL Intradermal Once  . lidocaine      .  olopatadine  1 drop Both Eyes BID  . ondansetron (ZOFRAN) IV  4 mg Intravenous Once  . pantoprazole  40 mg Oral Q1200  . sodium chloride  500 mL Intravenous Once  . sodium chloride  3 mL Intravenous Q12H  . triamcinolone cream  1 application Topical BID  . trimethoprim-polymyxin b  1 drop Left Eye Q4H  . DISCONTD: insulin aspart protamine-insulin aspart  20 Units Subcutaneous Q breakfast  . DISCONTD: iohexol  20 mL Oral Q1 Hr x 2   Assessment: 41 yo female admitted with nausea / vomiting. Pharmacy consulted to manage ceftazidime and vancomycin for empiric coverage.   Goal of Therapy:  Pre-HD vancomycin level 15-25 mcg/mL   Plan:  1. Vancomycin 2gm IV x 1, then 1gm IV Q-HD (M-W-F) 2. Ceftazidime 2gm IV x 1, then 2gm IV Q-HD (M-W-F)  Emeline Gins 04/25/2012,12:13 AM

## 2012-04-25 NOTE — Progress Notes (Signed)
[facility] PROGRESS NOTE  [name] ZOX:[id number] DOB: 1970/09/17 DOA: 04/24/2012 PCP: Lieutenant Diego, MD  Assessment/Plan: Principal Problem:  *Nausea and vomiting Active Problems:  Leucocytosis  ESRD (end stage renal disease) on dialysis  Diabetes mellitus  Anemia  Bacteremia  1. Nausea and Vomiting: This has decreased and pt is wanting to try clear liquids. Will start clears liquids and advance as tolerated. Likley secondary to GNR bacteremia. 2. Bacteremia: BC show GNR. Pt presently on Vancomycin and Fortaz. Will continue until resulted then narrow spectrum 3. DM II: BS elevated. Will adjust insulins. 4. ESRD: Per nephrology  Code Status: Full Family Communication: N/A Disposition Plan: Likely home at time of discharge  MATTHEWS,MICHELLE A.  [facility] Pager [phone]. If 8PM-8AM, please contact night-coverage at [email or web address], password [facility] 04/25/2012, 8:52 PM  LOS: 1 day   Brief narrative: 41 year-old female with known history of ESRD on hemodialysis on Monday Wednesday Friday was discharged home today morning after being treated for left AV graft bleeding presented back to the ER because of persistent nausea and vomiting. Patient states she has been having these symptoms for last 2 days with epigastric pain. Denies any chest pain or shortness of breath fever chills or any diarrhea. In the ER CT abdomen pelvis does not show any acute. Patient has been admitted for further management. Patient was found to be having leukocytosis and mild fever.   Consultants:  Nephrology  Procedures:  None  Antibiotics:  Elita Quick  8/26 >>  Vancomycin 8/26 >>  HPI/Subjective: Pt states that her nausea is better and her pain is controlled with Ultram.   Objective: Filed Vitals:   04/25/12 1759 04/25/12 1811 04/25/12 1822 04/25/12 1832  BP: 105/62 132/73 129/71 129/71  Pulse: 91 90 94 94  Temp:    97 F (36.1 C)  TempSrc:      Resp:      Weight:    84.8  kg (186 lb 15.2 oz)  SpO2:       Weight change:   Intake/Output Summary (Last 24 hours) at 04/25/12 2052 Last data filed at 04/25/12 1832  Gross per 24 hour  Intake    550 ml  Output   2900 ml  Net  -2350 ml    General: Alert, awake, oriented x3, in no acute distress.  HEENT: [place]/AT PEERL, EOMI Neck: Trachea midline,  no masses, no thyromegal,y no JVD, no carotid bruit OROPHARYNX:  Moist, No exudate/ erythema/lesions.  Heart: Regular rate and rhythm.  Lungs: Clear to auscultation. Abdomen: Soft, nontender, nondistended, positive bowel sounds, no masses no hepatosplenomegaly noted.  Neuro: No focal neurological deficits noted    Data Reviewed: Basic Metabolic Panel:  Lab 04/25/12 8295 04/24/12 1900 04/24/12 1027 04/24/12 0732  NA 132* 133* 136 134*  K 4.2 3.9 4.0 3.7  CL 91* 92* -- 93*  CO2 24 28 -- --  GLUCOSE 318* 278* 415* 394*  BUN 35* 27* -- 21  CREATININE 7.45* 6.89* -- 5.70*  CALCIUM 8.5 8.4 -- --  MG -- -- -- --  PHOS -- -- -- --   Liver Function Tests:  Lab 04/25/12 0550 04/24/12 1900  AST 20 20  ALT 6 9  ALKPHOS 82 73  BILITOT 0.2* 0.2*  PROT 6.7 6.6  ALBUMIN 2.6* 2.6*    Lab 04/24/12 1900  LIPASE 21  AMYLASE --   No results found for this basename: AMMONIA:5 in the last 168 hours CBC:  Lab 04/25/12 0550 04/24/12 1900 04/24/12 1027 04/24/12 0732  04/24/12 0707  WBC 20.6* 23.8* -- -- 33.5*  NEUTROABS 17.4* 20.5* -- -- 29.9*  HGB 8.2* 8.3* 9.5* 7.5* 6.2*  HCT 22.7* 23.4* 28.0* 22.0* 17.6*  MCV 81.4 81.5 -- -- 85.4  PLT 186 170 -- -- 211   Cardiac Enzymes: No results found for this basename: CKTOTAL:5,CKMB:5,CKMBINDEX:5,TROPONINI:5 in the last 168 hours BNP (last 3 results) No results found for this basename: PROBNP:3 in the last 8760 hours CBG:  Lab 04/25/12 1144 04/25/12 0809 04/25/12 0021 04/24/12 1151  GLUCAP 336* 359* 333* 408*    Recent Results (from the past 240 hour(s))  CULTURE, BLOOD (ROUTINE X 2)     Status: Normal (Preliminary  result)   Collection Time   04/24/12 11:15 PM      Component Value Range Status Comment   Specimen Description BLOOD RIGHT FOREARM   Final    Special Requests BOTTLES DRAWN AEROBIC ONLY 10CC   Final    Culture  Setup Time 04/25/2012 08:46   Final    Culture     Final    Value: GRAM NEGATIVE RODS     Note: CRITICAL RESULT CALLED TO, READ BACK BY AND VERIFIED WITH: VALENCIA M AT 6213 04/25/12 BY SNOLO   Report Status PENDING   Incomplete   MRSA PCR SCREENING     Status: Abnormal   Collection Time   04/25/12 12:03 AM      Component Value Range Status Comment   MRSA by PCR POSITIVE (*) NEGATIVE Final      Studies: Ct Abdomen Pelvis Wo Contrast  04/24/2012  *RADIOLOGY REPORT*  Clinical Data: Abdominal pain, nausea and vomiting.  CT ABDOMEN AND PELVIS WITHOUT CONTRAST  Technique:  Multidetector CT imaging of the abdomen and pelvis was performed following the standard protocol without intravenous contrast.  Comparison: CT abdomen and pelvis 06/30/2009.  Findings: Linear basilar atelectasis is seen bilaterally.  No pleural or pericardial effusion.  There may be a few tiny stones within the gallbladder but no CT evidence of cholecystitis is identified.  The liver, spleen, adrenal glands and pancreas are unremarkable.  There is marked renal atrophy bilaterally with cystic change and calcifications consistent with chronic renal failure.  The patient has extensive atherosclerotic vascular disease.  Uterus, adnexa and urinary bladder are unremarkable.  The stomach, small and large bowel and appendix all appear normal.  No lymphadenopathy or fluid is seen. There is no focal bony abnormality.  IMPRESSION:  1.  No acute finding. 2.  Possible tiny gallstones.  No evidence of cholecystitis. 3.  Findings consistent with chronic renal failure.   Original Report Authenticated By: Bernadene Bell. Maricela Curet, M.D.    US Abdomen Complete  04/25/2012  *RADIOLOGY REPORT*  Clinical Data:  Vomiting, abdominal pain, question  cholecystitis  ULTRASOUND ABDOMEN:  Technique:  Sonography of upper abdominal structures was performed. Image quality degraded by patient body habitus  Comparison:  None Correlation:  CT abdomen 04/24/2012  Gallbladder:  Tiny intraluminal echogenic focus without definite posterior shadowing within gallbladder question tiny nonshadowing calculus versus polyp.  Gallbladder wall normal thickness.  No pericholecystic fluid or sonographic Murphy's sign.  Common bile duct:  Normal caliber 3 mm diameter  Liver:  Normal appearance  IVC:  Normal appearance  Pancreas:  Head and tail incompletely visualized, partially obscured by bowel gas, with visualized portions normal appearance  Spleen:  Unremarkable  Right kidney:  8.3 cm length.  Cortical thinning.  Hypoechoic nodule at inferior pole 1.1 x 1.2 x 1.7 cm, by CT  a small cyst. No solid mass or hydronephrosis.  Left kidney:  7.2 cm length.  Diffuse cortical thinning.  Increased cortical echogenicity.  Hypoechoic nodule at midportion 9 x 8 x 14 mm, by CT a small cyst.  No hydronephrosis or solid mass.  Aorta:  Bifurcation obscured by bowel gas.  Visualized portions normal caliber.  Other:  No free fluid  IMPRESSION: Atrophic kidneys consistent with end-stage renal disease. Small bilateral renal cysts. Question tiny gallstones versus gallbladder polyps without sonographic evidence of acute cholecystitis.   Original Report Authenticated By: Lollie Marrow, M.D.    Dg Chest Port 1 View  04/25/2012  *RADIOLOGY REPORT*  Clinical Data: Shortness of breath.  "Rule out infiltrates."  PORTABLE CHEST - 1 VIEW  Comparison: 01/14/2010  Findings: Midline trachea.  Mild cardiomegaly. No pleural effusion or pneumothorax.  Left greater than right bibasilar airspace disease. No congestive failure.  IMPRESSION: Left greater than right bibasilar airspace disease.  Especially on the left, infection or aspiration cannot be excluded.  Favor atelectasis on the right.  Cardiomegaly without  congestive failure.   Original Report Authenticated By: Consuello Bossier, M.D.     Scheduled Meds:   . aspirin EC  81 mg Oral Daily  . calcium acetate  667 mg Oral TID WC  . cefTAZidime (FORTAZ)  IV  2 g Intravenous Once  . cefTAZidime (FORTAZ)  IV  2 g Intravenous Q M,W,F-HD  . Chlorhexidine Gluconate Cloth  6 each Topical Q0600  . darbepoetin (ARANESP) injection - DIALYSIS  100 mcg Intravenous Q Mon-HD  . ferric gluconate (FERRLECIT/NULECIT) IV  125 mg Intravenous Q Wed-HD  . insulin aspart  0-9 Units Subcutaneous TID WC  . insulin aspart  5 Units Subcutaneous Once  . insulin aspart protamine-insulin aspart  18 Units Subcutaneous Q supper  . insulin aspart protamine-insulin aspart  20 Units Subcutaneous Q breakfast  . multivitamin  1 tablet Oral QHS  . mupirocin ointment  1 application Nasal BID  . olopatadine  1 drop Both Eyes BID  . pantoprazole  40 mg Oral Q1200  . paricalcitol  6 mcg Intravenous Q M,W,F-HD  . sodium chloride  3 mL Intravenous Q12H  . triamcinolone cream  1 application Topical BID  . trimethoprim-polymyxin b  1 drop Left Eye Q4H  . vancomycin  2,000 mg Intravenous Once  . vancomycin  1,000 mg Intravenous Q M,W,F-HD  . DISCONTD: insulin aspart protamine-insulin aspart  20 Units Subcutaneous Q breakfast  . DISCONTD: iohexol  20 mL Oral Q1 Hr x 2   Continuous Infusions:   Principal Problem:  *Nausea and vomiting Active Problems:  Leucocytosis  ESRD (end stage renal disease) on dialysis  Diabetes mellitus  Anemia  Bacteremia

## 2012-04-25 NOTE — Progress Notes (Signed)
Dr. Ashley Royalty notified of patients, blood cultures showed gram negative rods.

## 2012-04-25 NOTE — Progress Notes (Signed)
Patient was placed on the bed pan because she feels she needs to go to br.Checked on pt a few minutes after but pt says she still needs to sit on it a few more minutes.Checked on pt  4-5 more times but pt refused to get off bedpan,explained to her that she can't sit on the bedpan for long period.Offered to take her off and put her back again if she still feels she needs it.Patient refused to get off bedpan.Incoming RN is aware. Kendarious Gudino [name],RN

## 2012-04-25 NOTE — Progress Notes (Signed)
Subjective:  COS abdominal pain and n/v since   Going home yesterday after Femoral Avgg repair from False Aneurysmal bleed. Came beck to hospital.   Now still with same cos.  For Hd today Objective Vital signs in last 24 hours: Filed Vitals:   04/24/12 2329 04/24/12 2358 04/24/12 2359 04/25/12 0637  BP: 109/63  105/68 148/71  Pulse: 101  98 97  Temp: 98.8 F (37.1 C)  98.8 F (37.1 C) 98.7 F (37.1 C)  TempSrc: Oral  Oral Oral  Resp: 18  18 18   Weight:  87.5 kg (192 lb 14.4 oz)    SpO2: 100%  100% 100%   Weight change:   Intake/Output Summary (Last 24 hours) at 04/25/12 1129 Last data filed at 04/25/12 0128  Gross per 24 hour  Intake    550 ml  Output      0 ml  Net    550 ml   Labs: Basic Metabolic Panel:  Lab 04/25/12 1610 04/24/12 1900 04/24/12 1027 04/24/12 0732  NA 132* 133* 136 --  K 4.2 3.9 4.0 --  CL 91* 92* -- 93*  CO2 24 28 -- --  GLUCOSE 318* 278* 415* --  BUN 35* 27* -- 21  CREATININE 7.45* 6.89* -- 5.70*  CALCIUM 8.5 8.4 -- --  ALB -- -- -- --  PHOS -- -- -- --   Liver Function Tests:  Lab 04/25/12 0550 04/24/12 1900  AST 20 20  ALT 6 9  ALKPHOS 82 73  BILITOT 0.2* 0.2*  PROT 6.7 6.6  ALBUMIN 2.6* 2.6*    Lab 04/24/12 1900  LIPASE 21  AMYLASE --   No results found for this basename: AMMONIA:3 in the last 168 hours CBC:  Lab 04/25/12 0550 04/24/12 1900 04/24/12 1027 04/24/12 0707  WBC 20.6* 23.8* -- 33.5*  NEUTROABS 17.4* 20.5* -- 29.9*  HGB 8.2* 8.3* 9.5* --  HCT 22.7* 23.4* 28.0* --  MCV 81.4 81.5 -- 85.4  PLT 186 170 -- 211   Cardiac Enzymes: No results found for this basename: CKTOTAL:5,CKMB:5,CKMBINDEX:5,TROPONINI:5 in the last 168 hours CBG:  Lab 04/25/12 0809 04/25/12 0021 04/24/12 1151  GLUCAP 359* 333* 408*    Iron Studies: No results found for this basename: IRON,TIBC,TRANSFERRIN,FERRITIN in the last 72 hours Studies/Results: Ct Abdomen Pelvis Wo Contrast  04/24/2012  *RADIOLOGY REPORT*  Clinical Data: Abdominal pain,  nausea and vomiting.  CT ABDOMEN AND PELVIS WITHOUT CONTRAST  Technique:  Multidetector CT imaging of the abdomen and pelvis was performed following the standard protocol without intravenous contrast.  Comparison: CT abdomen and pelvis 06/30/2009.  Findings: Linear basilar atelectasis is seen bilaterally.  No pleural or pericardial effusion.  There may be a few tiny stones within the gallbladder but no CT evidence of cholecystitis is identified.  The liver, spleen, adrenal glands and pancreas are unremarkable.  There is marked renal atrophy bilaterally with cystic change and calcifications consistent with chronic renal failure.  The patient has extensive atherosclerotic vascular disease.  Uterus, adnexa and urinary bladder are unremarkable.  The stomach, small and large bowel and appendix all appear normal.  No lymphadenopathy or fluid is seen. There is no focal bony abnormality.  IMPRESSION:  1.  No acute finding. 2.  Possible tiny gallstones.  No evidence of cholecystitis. 3.  Findings consistent with chronic renal failure.   Original Report Authenticated By: Bernadene Bell. Maricela Curet, M.D.    US Abdomen Complete  04/25/2012  *RADIOLOGY REPORT*  Clinical Data:  Vomiting, abdominal pain, question  cholecystitis  ULTRASOUND ABDOMEN:  Technique:  Sonography of upper abdominal structures was performed. Image quality degraded by patient body habitus  Comparison:  None Correlation:  CT abdomen 04/24/2012  Gallbladder:  Tiny intraluminal echogenic focus without definite posterior shadowing within gallbladder question tiny nonshadowing calculus versus polyp.  Gallbladder wall normal thickness.  No pericholecystic fluid or sonographic Murphy's sign.  Common bile duct:  Normal caliber 3 mm diameter  Liver:  Normal appearance  IVC:  Normal appearance  Pancreas:  Head and tail incompletely visualized, partially obscured by bowel gas, with visualized portions normal appearance  Spleen:  Unremarkable  Right kidney:  8.3 cm length.   Cortical thinning.  Hypoechoic nodule at inferior pole 1.1 x 1.2 x 1.7 cm, by CT a small cyst. No solid mass or hydronephrosis.  Left kidney:  7.2 cm length.  Diffuse cortical thinning.  Increased cortical echogenicity.  Hypoechoic nodule at midportion 9 x 8 x 14 mm, by CT a small cyst.  No hydronephrosis or solid mass.  Aorta:  Bifurcation obscured by bowel gas.  Visualized portions normal caliber.  Other:  No free fluid  IMPRESSION: Atrophic kidneys consistent with end-stage renal disease. Small bilateral renal cysts. Question tiny gallstones versus gallbladder polyps without sonographic evidence of acute cholecystitis.   Original Report Authenticated By: Lollie Marrow, M.D.    Dg Chest Port 1 View  04/25/2012  *RADIOLOGY REPORT*  Clinical Data: Shortness of breath.  "Rule out infiltrates."  PORTABLE CHEST - 1 VIEW  Comparison: 01/14/2010  Findings: Midline trachea.  Mild cardiomegaly. No pleural effusion or pneumothorax.  Left greater than right bibasilar airspace disease. No congestive failure.  IMPRESSION: Left greater than right bibasilar airspace disease.  Especially on the left, infection or aspiration cannot be excluded.  Favor atelectasis on the right.  Cardiomegaly without congestive failure.   Original Report Authenticated By: Consuello Bossier, M.D.    Medications:      . acetaminophen  650 mg Oral Once  . aspirin EC  81 mg Oral Daily  . calcium acetate  667 mg Oral TID WC  . cefTAZidime (FORTAZ)  IV  2 g Intravenous Once  . cefTAZidime (FORTAZ)  IV  2 g Intravenous Q M,W,F-HD  . Chlorhexidine Gluconate Cloth  6 each Topical Q0600  . insulin aspart  0-9 Units Subcutaneous TID WC  . insulin aspart  5 Units Subcutaneous Once  . insulin aspart protamine-insulin aspart  18 Units Subcutaneous Q supper  . insulin aspart protamine-insulin aspart  20 Units Subcutaneous Q breakfast  . lidocaine  2 mL Intradermal Once  . lidocaine      . multivitamin  1 tablet Oral QHS  . mupirocin ointment  1  application Nasal BID  . olopatadine  1 drop Both Eyes BID  . ondansetron (ZOFRAN) IV  4 mg Intravenous Once  . pantoprazole  40 mg Oral Q1200  . sodium chloride  500 mL Intravenous Once  . sodium chloride  3 mL Intravenous Q12H  . triamcinolone cream  1 application Topical BID  . trimethoprim-polymyxin b  1 drop Left Eye Q4H  . vancomycin  2,000 mg Intravenous Once  . vancomycin  1,000 mg Intravenous Q M,W,F-HD  . DISCONTD: insulin aspart protamine-insulin aspart  20 Units Subcutaneous Q breakfast  . DISCONTD: iohexol  20 mL Oral Q1 Hr x 2   I  have reviewed scheduled and prn medications.  Physical Exam: General: Alert Obese BF , chronically ill appearance, Heart: RRR, 1/6 sem LSB,  no rub Lungs: CTA  bilaterally Abdomen: Obese, bs pos., slightly tender epigastric area Extremities: Dialysis Access: bilaterally AKAs, left femoral Avgg positve bruit, surgical bandage present small amount dried blood      Hemodialysis OP Orders=  [facility] , 4 hrs. ,edw 84.5 kg Left Femoral AVGG,  BFR 400. DFR 800,  EPO= 2,800 q hd , Infed 50 mg q weekly hd Zemplar 6.30mcg q hd  Heparin 7.0 ml q hd   Problem/Plan: 1. Nausea / Vomiting/ Abdominal Pain= ? Diabetic Gastroparesis   Admit team work up/ rx 2. Fever low grade/ Leukocytosis= ? Sepsis sp avgg bleed and pseudoanuersym repair yesterday,   bc pending On Vanco And Fortaz/ sp PRBC transfusion yesterday 3. ESRD - mwf [facility] schedule= hd today  Use no heparin sp avgg surg yesterday 4. Anemia - hgb= 8.2 yesterday after 2u prbcs 9.5, prior to or 6.2/ op hgb 04/20/12= 9.6   On weekly 50mg  infed, epo 2800 / Aranesp 5. Secondary hyperparathyroidism - ca 8.4/ Renvela when taking pos zemplar 6. HTN/volume -  bp=148/71 in er 105/68, no meds now  Op was onNorvasc 10mg  hs hold for hd 3kg over edw which she usually makes at op center  87.5 kg  (84.5 kg edw) 7. IDDM type 2= ssi per admit  Lenny Pastel, PA-C [facility]  [phone] 04/25/2012,11:29 AM  LOS: 1 day   Patient seen and examined and agree with assessment and plan as above.  Vinson Moselle  MD [employer] [phone] pgr    [phone] cell 04/25/2012, 5:18 PM

## 2012-04-25 NOTE — Progress Notes (Signed)
CRITICAL VALUE ALERT  Critical value received:  Positive MRSA PCR  Date of notification:  04/25/12  Time of notification:  01:57  Critical value read back:yes  Nurse who received alert:  Abbott Pao  MD notified (1st page):  M. Lynch  Time of first page:  02:43  MD notified (2nd page):  Time of second page:  Responding MD:  M. Lynch,NP  Time MD responded:  02:45

## 2012-04-26 LAB — BASIC METABOLIC PANEL
CO2: 26 mEq/L (ref 19–32)
Calcium: 9.4 mg/dL (ref 8.4–10.5)
Creatinine, Ser: 5.81 mg/dL — ABNORMAL HIGH (ref 0.50–1.10)
GFR calc non Af Amer: 8 mL/min — ABNORMAL LOW (ref >90)

## 2012-04-26 LAB — CBC WITH DIFFERENTIAL/PLATELET
Basophils Absolute: 0 10*3/uL (ref 0.0–0.1)
Eosinophils Absolute: 0 10*3/uL (ref 0.0–0.7)
Eosinophils Relative: 0 % (ref 0–5)
HCT: 22.6 % — ABNORMAL LOW (ref 36.0–46.0)
Lymphocytes Relative: 14 % (ref 12–46)
MCH: 28.2 pg (ref 26.0–34.0)
MCHC: 34.5 g/dL (ref 30.0–36.0)
MCV: 81.6 fL (ref 78.0–100.0)
Monocytes Absolute: 1 10*3/uL (ref 0.1–1.0)
RDW: 14.9 % (ref 11.5–15.5)
WBC: 17.2 10*3/uL — ABNORMAL HIGH (ref 4.0–10.5)

## 2012-04-26 LAB — GLUCOSE, CAPILLARY
Glucose-Capillary: 116 mg/dL — ABNORMAL HIGH (ref 70–99)
Glucose-Capillary: 139 mg/dL — ABNORMAL HIGH (ref 70–99)
Glucose-Capillary: 35 mg/dL — CL (ref 70–99)

## 2012-04-26 LAB — HEMOGLOBIN A1C
Hgb A1c MFr Bld: 7.5 % — ABNORMAL HIGH (ref <5.7)
Mean Plasma Glucose: 169 mg/dL — ABNORMAL HIGH (ref <117)

## 2012-04-26 MED ORDER — OXYCODONE HCL 5 MG PO TABS
5.0000 mg | ORAL_TABLET | ORAL | Status: DC | PRN
  Administered 2012-04-26 – 2012-04-28 (×3): 5 mg
  Filled 2012-04-26 (×3): qty 1

## 2012-04-26 MED ORDER — OXYCODONE HCL 5 MG/5ML PO SOLN: 5.0000 mg | ORAL | Status: DC | PRN

## 2012-04-26 MED ORDER — HYDROMORPHONE HCL PF 1 MG/ML IJ SOLN
1.0000 mg | Freq: Once | INTRAMUSCULAR | Status: AC
  Administered 2012-04-26: 1 mg via INTRAVENOUS
  Filled 2012-04-26: qty 1

## 2012-04-26 MED ORDER — CHLORHEXIDINE GLUCONATE CLOTH 2 % EX PADS
6.0000 | MEDICATED_PAD | Freq: Every morning | CUTANEOUS | Status: DC
  Administered 2012-04-26 – 2012-04-29 (×4): 6 via TOPICAL

## 2012-04-26 MED ORDER — HEPARIN SODIUM (PORCINE) 1000 UNIT/ML DIALYSIS: 2000.0000 [IU] | INTRAMUSCULAR | Status: DC | PRN

## 2012-04-26 NOTE — Progress Notes (Addendum)
Subjective:  Blood cx's + for gnr, wbc down 17k, c/o severe abd pain mid abdomen thru to back  Objective Vital signs in last 24 hours: Filed Vitals:   04/25/12 1832 04/25/12 2111 04/26/12 0507 04/26/12 1000  BP: 129/71 116/73 161/87 148/88  Pulse: 94 97 92 88  Temp: 97 F (36.1 C) 98.3 F (36.8 C) 98.3 F (36.8 C) 98.6 F (37 C)  TempSrc:  Oral Oral Oral  Resp:  17 20 20   Weight: 84.8 kg (186 lb 15.2 oz) 84.1 kg (185 lb 6.5 oz)    SpO2:  91% 98% 95%   Weight change: 1.2 kg (2 lb 10.3 oz)  Intake/Output Summary (Last 24 hours) at 04/26/12 1038 Last data filed at 04/25/12 2030  Gross per 24 hour  Intake      0 ml  Output   2901 ml  Net  -2901 ml   Labs: Basic Metabolic Panel:  Lab 04/26/12 0865 04/25/12 0550 04/24/12 1900  NA 136 132* 133*  K 3.6 4.2 3.9  CL 94* 91* 92*  CO2 26 24 28   GLUCOSE 280* 318* 278*  BUN 22 35* 27*  CREATININE 5.81* 7.45* 6.89*  CALCIUM 9.4 8.5 8.4  ALB -- -- --  PHOS -- -- --   Liver Function Tests:  Lab 04/25/12 0550 04/24/12 1900  AST 20 20  ALT 6 9  ALKPHOS 82 73  BILITOT 0.2* 0.2*  PROT 6.7 6.6  ALBUMIN 2.6* 2.6*    Lab 04/24/12 1900  LIPASE 21  AMYLASE --   No results found for this basename: AMMONIA:3 in the last 168 hours CBC:  Lab 04/26/12 0855 04/25/12 0550 04/24/12 1900 04/24/12 0707  WBC 17.2* 20.6* 23.8* --  NEUTROABS 13.7* 17.4* 20.5* --  HGB 7.8* 8.2* 8.3* --  HCT 22.6* 22.7* 23.4* --  MCV 81.6 81.4 81.5 85.4  PLT 217 186 170 --   Cardiac Enzymes: No results found for this basename: CKTOTAL:5,CKMB:5,CKMBINDEX:5,TROPONINI:5 in the last 168 hours CBG:  Lab 04/26/12 0741 04/25/12 2107 04/25/12 1144 04/25/12 0809 04/25/12 0021  GLUCAP 189* 115* 336* 359* 333*    Iron Studies: No results found for this basename: IRON,TIBC,TRANSFERRIN,FERRITIN in the last 72 hours Studies/Results: Ct Abdomen Pelvis Wo Contrast  04/24/2012  *RADIOLOGY REPORT*  Clinical Data: Abdominal pain, nausea and vomiting.  CT ABDOMEN  AND PELVIS WITHOUT CONTRAST  Technique:  Multidetector CT imaging of the abdomen and pelvis was performed following the standard protocol without intravenous contrast.  Comparison: CT abdomen and pelvis 06/30/2009.  Findings: Linear basilar atelectasis is seen bilaterally.  No pleural or pericardial effusion.  There may be a few tiny stones within the gallbladder but no CT evidence of cholecystitis is identified.  The liver, spleen, adrenal glands and pancreas are unremarkable.  There is marked renal atrophy bilaterally with cystic change and calcifications consistent with chronic renal failure.  The patient has extensive atherosclerotic vascular disease.  Uterus, adnexa and urinary bladder are unremarkable.  The stomach, small and large bowel and appendix all appear normal.  No lymphadenopathy or fluid is seen. There is no focal bony abnormality.  IMPRESSION:  1.  No acute finding. 2.  Possible tiny gallstones.  No evidence of cholecystitis. 3.  Findings consistent with chronic renal failure.   Original Report Authenticated By: Bernadene Bell. Maricela Curet, M.D.    US Abdomen Complete  04/25/2012  *RADIOLOGY REPORT*  Clinical Data:  Vomiting, abdominal pain, question cholecystitis  ULTRASOUND ABDOMEN:  Technique:  Sonography of upper abdominal structures  was performed. Image quality degraded by patient body habitus  Comparison:  None Correlation:  CT abdomen 04/24/2012  Gallbladder:  Tiny intraluminal echogenic focus without definite posterior shadowing within gallbladder question tiny nonshadowing calculus versus polyp.  Gallbladder wall normal thickness.  No pericholecystic fluid or sonographic Murphy's sign.  Common bile duct:  Normal caliber 3 mm diameter  Liver:  Normal appearance  IVC:  Normal appearance  Pancreas:  Head and tail incompletely visualized, partially obscured by bowel gas, with visualized portions normal appearance  Spleen:  Unremarkable  Right kidney:  8.3 cm length.  Cortical thinning.  Hypoechoic  nodule at inferior pole 1.1 x 1.2 x 1.7 cm, by CT a small cyst. No solid mass or hydronephrosis.  Left kidney:  7.2 cm length.  Diffuse cortical thinning.  Increased cortical echogenicity.  Hypoechoic nodule at midportion 9 x 8 x 14 mm, by CT a small cyst.  No hydronephrosis or solid mass.  Aorta:  Bifurcation obscured by bowel gas.  Visualized portions normal caliber.  Other:  No free fluid  IMPRESSION: Atrophic kidneys consistent with end-stage renal disease. Small bilateral renal cysts. Question tiny gallstones versus gallbladder polyps without sonographic evidence of acute cholecystitis.   Original Report Authenticated By: Lollie Marrow, M.D.    Dg Chest Port 1 View  04/25/2012  *RADIOLOGY REPORT*  Clinical Data: Shortness of breath.  "Rule out infiltrates."  PORTABLE CHEST - 1 VIEW  Comparison: 01/14/2010  Findings: Midline trachea.  Mild cardiomegaly. No pleural effusion or pneumothorax.  Left greater than right bibasilar airspace disease. No congestive failure.  IMPRESSION: Left greater than right bibasilar airspace disease.  Especially on the left, infection or aspiration cannot be excluded.  Favor atelectasis on the right.  Cardiomegaly without congestive failure.   Original Report Authenticated By: Consuello Bossier, M.D.    Medications:      . aspirin EC  81 mg Oral Daily  . calcium acetate  667 mg Oral TID WC  . cefTAZidime (FORTAZ)  IV  2 g Intravenous Q M,W,F-HD  . Chlorhexidine Gluconate Cloth  6 each Topical q morning - 10a  . darbepoetin (ARANESP) injection - DIALYSIS  100 mcg Intravenous Q Mon-HD  . ferric gluconate (FERRLECIT/NULECIT) IV  125 mg Intravenous Q Wed-HD  . insulin aspart  0-20 Units Subcutaneous TID WC  . insulin aspart  0-5 Units Subcutaneous QHS  . insulin aspart protamine-insulin aspart  18 Units Subcutaneous Q supper  . insulin aspart protamine-insulin aspart  20 Units Subcutaneous Q breakfast  . multivitamin  1 tablet Oral QHS  . mupirocin ointment  1 application  Nasal BID  . olopatadine  1 drop Both Eyes BID  . pantoprazole  40 mg Oral Q1200  . paricalcitol  6 mcg Intravenous Q M,W,F-HD  . sodium chloride  3 mL Intravenous Q12H  . triamcinolone cream  1 application Topical BID  . trimethoprim-polymyxin b  1 drop Left Eye Q4H  . vancomycin  1,000 mg Intravenous Q M,W,F-HD  . DISCONTD: Chlorhexidine Gluconate Cloth  6 each Topical Q0600  . DISCONTD: insulin aspart  0-9 Units Subcutaneous TID WC   I  have reviewed scheduled and prn medications.  Physical Exam: General: Alert Obese BF , chronically ill appearance, Heart: RRR, 1/6 sem LSB, no rub Lungs: CTA  bilaterally Abdomen: Obese, bs decreased, mod tender throughout abdomen, firm Extremities: Dialysis Access: bilaterally AKAs, left femoral Avgg positve bruit, medial wound open with sutures present and drain hemopurulent liquid, sent for culture  Hemodialysis OP Orders=  [facility] , 4 hrs. ,edw 84.5 kg Left Femoral AVGG,  BFR 400. DFR 800,  EPO= 2,800 q hd , Infed 50 mg q weekly hd Zemplar 6.43mcg q hd  Heparin 7.0 ml q hd Home BP-  ? norvasc 10/d, not on hospital home med list  Problems: 1. Gram neg bacteremia- may have avg infxn, +drainage sent for cx, abx. D/w [facility] 2. Abd pain- had CT and [place] of abdomen Sat w/o sig findings, pain meds per primary 3. ESRD - mwf [facility], use tight hep, at dry wt 4. Anemia- cont epo, fe 5. HPTH- cont binders, vit D 6. HTN- no meds per admit list 7. 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

## 2012-04-26 NOTE — Progress Notes (Signed)
Hypoglycemic Event  CBG: 30 (left hand), 35 (right hand)  Treatment: 15 GM carbohydrate snack  Symptoms: Sweaty and Shaky  Follow-up CBG: Time:2240 CBG Result:83  Possible Reasons for Event: Medication dosing of insulin or poor appetite  Comments/MD notified:    Leanna Battles  Remember to initiate Hypoglycemia Order Set & complete

## 2012-04-26 NOTE — Progress Notes (Signed)
[facility] PROGRESS NOTE  [name] RUE:[id number] DOB: 1970/09/15 DOA: 04/24/2012 PCP: Lieutenant Diego, MD  Assessment/Plan: Principal Problem:  *Nausea and vomiting Active Problems:  Leucocytosis  ESRD (end stage renal disease) on dialysis  Diabetes mellitus  Anemia  Bacteremia  1. Nausea and Vomiting: Patient has had no further vomiting and she is tolerating clear liquids. She is requesting her diabetes to full liquids which I think is reasonable at this time. 2. Bacteremia: BC show GNR. Pt presently on Vancomycin and Fortaz. Will continue until resulted then narrow spectrum. 3. DM II: BS elevated. Blood sugar is much better controlled today was adjusted regimen 4. ESRD: Per nephrology 5. S/P repair of left femoral AV graft: Patient is status post repair of left femoral AV graft on 04/24/2012. At that time there was no evidence of infection however today the patient is having some hemopurulent discharge from the graft site. He has been sent for culture.  Code Status: Full Family Communication: N/A Disposition Plan: Likely home at time of discharge  Laveah Gloster A.  [facility] Pager [phone]. If 8PM-8AM, please contact night-coverage at [email or web address], password [facility] 04/26/2012, 9:13 PM  LOS: 2 days   Brief narrative: 41 year-old female with known history of ESRD on hemodialysis on Monday Wednesday Friday was discharged home today morning after being treated for left AV graft bleeding presented back to the ER because of persistent nausea and vomiting. Patient states she has been having these symptoms for last 2 days with epigastric pain. Denies any chest pain or shortness of breath fever chills or any diarrhea. In the ER CT abdomen pelvis does not show any acute. Patient has been admitted for further management. Patient was found to be having leukocytosis and mild fever.   Consultants:  Nephrology  Procedures:  None  Antibiotics:  Elita Quick  8/26  >>  Vancomycin 8/26 >>  HPI/Subjective: Pt states that her nausea is better and her pain is poorly controlled with the Ultram today but she did get relief from her pain with IV Dilaudid and oxycodone. Please note that the patient states that her abdominal pain is a chronic condition which seems to be worse at this time.   Objective: Filed Vitals:   04/25/12 2111 04/26/12 0507 04/26/12 1000 04/26/12 1742  BP: 116/73 161/87 148/88 131/79  Pulse: 97 92 88 90  Temp: 98.3 F (36.8 C) 98.3 F (36.8 C) 98.6 F (37 C) 98.4 F (36.9 C)  TempSrc: Oral Oral Oral Oral  Resp: 17 20 20 18   Weight: 84.1 kg (185 lb 6.5 oz)     SpO2: 91% 98% 95% 95%   Weight change: 1.2 kg (2 lb 10.3 oz)  Intake/Output Summary (Last 24 hours) at 04/26/12 2113 Last data filed at 04/26/12 1700  Gross per 24 hour  Intake    240 ml  Output      0 ml  Net    240 ml    General: Alert, awake, oriented x3, in no acute distress.  HEENT: [place]/AT PEERL, EOMI Neck: Trachea midline,  no masses, no thyromegal,y no JVD, no carotid bruit OROPHARYNX:  Moist, No exudate/ erythema/lesions.  Heart: Regular rate and rhythm.  Lungs: Clear to auscultation. Abdomen: Soft, nontender, nondistended, positive bowel sounds, no masses no hepatosplenomegaly noted.  Neuro: No focal neurological deficits noted    Data Reviewed: Basic Metabolic Panel:  Lab 04/26/12 6213 04/25/12 0550 04/24/12 1900 04/24/12 1027 04/24/12 0732  NA 136 132* 133* 136 134*  K 3.6 4.2 3.9 4.0 3.7  CL 94* 91* 92* -- 93*  CO2 26 24 28  -- --  GLUCOSE 280* 318* 278* 415* 394*  BUN 22 35* 27* -- 21  CREATININE 5.81* 7.45* 6.89* -- 5.70*  CALCIUM 9.4 8.5 8.4 -- --  MG -- -- -- -- --  PHOS -- -- -- -- --   Liver Function Tests:  Lab 04/25/12 0550 04/24/12 1900  AST 20 20  ALT 6 9  ALKPHOS 82 73  BILITOT 0.2* 0.2*  PROT 6.7 6.6  ALBUMIN 2.6* 2.6*    Lab 04/24/12 1900  LIPASE 21  AMYLASE --   No results found for this basename: AMMONIA:5 in the  last 168 hours CBC:  Lab 04/26/12 0855 04/25/12 0550 04/24/12 1900 04/24/12 1027 04/24/12 0732 04/24/12 0707  WBC 17.2* 20.6* 23.8* -- -- 33.5*  NEUTROABS 13.7* 17.4* 20.5* -- -- 29.9*  HGB 7.8* 8.2* 8.3* 9.5* 7.5* --  HCT 22.6* 22.7* 23.4* 28.0* 22.0* --  MCV 81.6 81.4 81.5 -- -- 85.4  PLT 217 186 170 -- -- 211   Cardiac Enzymes: No results found for this basename: CKTOTAL:5,CKMB:5,CKMBINDEX:5,TROPONINI:5 in the last 168 hours BNP (last 3 results) No results found for this basename: PROBNP:3 in the last 8760 hours CBG:  Lab 04/26/12 1627 04/26/12 1157 04/26/12 0741 04/25/12 2107 04/25/12 1144  GLUCAP 139* 116* 189* 115* 336*    Recent Results (from the past 240 hour(s))  CULTURE, BLOOD (ROUTINE X 2)     Status: Normal (Preliminary result)   Collection Time   04/24/12 11:15 PM      Component Value Range Status Comment   Specimen Description BLOOD RIGHT FOREARM   Final    Special Requests BOTTLES DRAWN AEROBIC ONLY 10CC   Final    Culture  Setup Time 04/25/2012 08:46   Final    Culture     Final    Value: GRAM NEGATIVE RODS     Note: CRITICAL RESULT CALLED TO, READ BACK BY AND VERIFIED WITH: VALENCIA M AT 1610 04/25/12 BY SNOLO   Report Status PENDING   Incomplete   CULTURE, BLOOD (ROUTINE X 2)     Status: Normal (Preliminary result)   Collection Time   04/24/12 11:30 PM      Component Value Range Status Comment   Specimen Description BLOOD RIGHT HAND   Final    Special Requests BOTTLES DRAWN AEROBIC AND ANAEROBIC 5CC EA   Final    Culture  Setup Time 04/25/2012 08:46   Final    Culture     Final    Value:        BLOOD CULTURE RECEIVED NO GROWTH TO DATE CULTURE WILL BE HELD FOR 5 DAYS BEFORE ISSUING A FINAL NEGATIVE REPORT   Report Status PENDING   Incomplete   MRSA PCR SCREENING     Status: Abnormal   Collection Time   04/25/12 12:03 AM      Component Value Range Status Comment   MRSA by PCR POSITIVE (*) NEGATIVE Final      Studies: Ct Abdomen Pelvis Wo  Contrast  04/24/2012  *RADIOLOGY REPORT*  Clinical Data: Abdominal pain, nausea and vomiting.  CT ABDOMEN AND PELVIS WITHOUT CONTRAST  Technique:  Multidetector CT imaging of the abdomen and pelvis was performed following the standard protocol without intravenous contrast.  Comparison: CT abdomen and pelvis 06/30/2009.  Findings: Linear basilar atelectasis is seen bilaterally.  No pleural or pericardial effusion.  There may be a few tiny stones within the gallbladder but no  CT evidence of cholecystitis is identified.  The liver, spleen, adrenal glands and pancreas are unremarkable.  There is marked renal atrophy bilaterally with cystic change and calcifications consistent with chronic renal failure.  The patient has extensive atherosclerotic vascular disease.  Uterus, adnexa and urinary bladder are unremarkable.  The stomach, small and large bowel and appendix all appear normal.  No lymphadenopathy or fluid is seen. There is no focal bony abnormality.  IMPRESSION:  1.  No acute finding. 2.  Possible tiny gallstones.  No evidence of cholecystitis. 3.  Findings consistent with chronic renal failure.   Original Report Authenticated By: Bernadene Bell. Maricela Curet, M.D.    US Abdomen Complete  04/25/2012  *RADIOLOGY REPORT*  Clinical Data:  Vomiting, abdominal pain, question cholecystitis  ULTRASOUND ABDOMEN:  Technique:  Sonography of upper abdominal structures was performed. Image quality degraded by patient body habitus  Comparison:  None Correlation:  CT abdomen 04/24/2012  Gallbladder:  Tiny intraluminal echogenic focus without definite posterior shadowing within gallbladder question tiny nonshadowing calculus versus polyp.  Gallbladder wall normal thickness.  No pericholecystic fluid or sonographic Murphy's sign.  Common bile duct:  Normal caliber 3 mm diameter  Liver:  Normal appearance  IVC:  Normal appearance  Pancreas:  Head and tail incompletely visualized, partially obscured by bowel gas, with visualized  portions normal appearance  Spleen:  Unremarkable  Right kidney:  8.3 cm length.  Cortical thinning.  Hypoechoic nodule at inferior pole 1.1 x 1.2 x 1.7 cm, by CT a small cyst. No solid mass or hydronephrosis.  Left kidney:  7.2 cm length.  Diffuse cortical thinning.  Increased cortical echogenicity.  Hypoechoic nodule at midportion 9 x 8 x 14 mm, by CT a small cyst.  No hydronephrosis or solid mass.  Aorta:  Bifurcation obscured by bowel gas.  Visualized portions normal caliber.  Other:  No free fluid  IMPRESSION: Atrophic kidneys consistent with end-stage renal disease. Small bilateral renal cysts. Question tiny gallstones versus gallbladder polyps without sonographic evidence of acute cholecystitis.   Original Report Authenticated By: Lollie Marrow, M.D.    Dg Chest Port 1 View  04/25/2012  *RADIOLOGY REPORT*  Clinical Data: Shortness of breath.  "Rule out infiltrates."  PORTABLE CHEST - 1 VIEW  Comparison: 01/14/2010  Findings: Midline trachea.  Mild cardiomegaly. No pleural effusion or pneumothorax.  Left greater than right bibasilar airspace disease. No congestive failure.  IMPRESSION: Left greater than right bibasilar airspace disease.  Especially on the left, infection or aspiration cannot be excluded.  Favor atelectasis on the right.  Cardiomegaly without congestive failure.   Original Report Authenticated By: Consuello Bossier, M.D.     Scheduled Meds:    . aspirin EC  81 mg Oral Daily  . calcium acetate  667 mg Oral TID WC  . cefTAZidime (FORTAZ)  IV  2 g Intravenous Q M,W,F-HD  . Chlorhexidine Gluconate Cloth  6 each Topical q morning - 10a  . darbepoetin (ARANESP) injection - DIALYSIS  100 mcg Intravenous Q Mon-HD  . ferric gluconate (FERRLECIT/NULECIT) IV  125 mg Intravenous Q Wed-HD  .  HYDROmorphone (DILAUDID) injection  1 mg Intravenous Once  . insulin aspart  0-20 Units Subcutaneous TID WC  . insulin aspart  0-5 Units Subcutaneous QHS  . insulin aspart protamine-insulin aspart  18  Units Subcutaneous Q supper  . insulin aspart protamine-insulin aspart  20 Units Subcutaneous Q breakfast  . multivitamin  1 tablet Oral QHS  . mupirocin ointment  1 application  Nasal BID  . olopatadine  1 drop Both Eyes BID  . pantoprazole  40 mg Oral Q1200  . paricalcitol  6 mcg Intravenous Q M,W,F-HD  . sodium chloride  3 mL Intravenous Q12H  . triamcinolone cream  1 application Topical BID  . trimethoprim-polymyxin b  1 drop Left Eye Q4H  . vancomycin  1,000 mg Intravenous Q M,W,F-HD  . DISCONTD: Chlorhexidine Gluconate Cloth  6 each Topical Q0600   Continuous Infusions:   Principal Problem:  *Nausea and vomiting Active Problems:  Leucocytosis  ESRD (end stage renal disease) on dialysis  Diabetes mellitus  Anemia  Bacteremia

## 2012-04-27 ENCOUNTER — Inpatient Hospital Stay (HOSPITAL_COMMUNITY): Payer: Medicare Other

## 2012-04-27 DIAGNOSIS — D649 Anemia, unspecified: Secondary | ICD-10-CM

## 2012-04-27 DIAGNOSIS — R112 Nausea with vomiting, unspecified: Secondary | ICD-10-CM

## 2012-04-27 LAB — CBC WITH DIFFERENTIAL/PLATELET
Basophils Absolute: 0 10*3/uL (ref 0.0–0.1)
HCT: 20.6 % — ABNORMAL LOW (ref 36.0–46.0)
Lymphocytes Relative: 24 % (ref 12–46)
Neutro Abs: 9.1 10*3/uL — ABNORMAL HIGH (ref 1.7–7.7)
Platelets: 248 10*3/uL (ref 150–400)
RDW: 14.9 % (ref 11.5–15.5)
WBC: 13.5 10*3/uL — ABNORMAL HIGH (ref 4.0–10.5)

## 2012-04-27 LAB — RENAL FUNCTION PANEL
Albumin: 2.7 g/dL — ABNORMAL LOW (ref 3.5–5.2)
CO2: 26 mEq/L (ref 19–32)
Calcium: 9 mg/dL (ref 8.4–10.5)
Creatinine, Ser: 7.18 mg/dL — ABNORMAL HIGH (ref 0.50–1.10)
GFR calc Af Amer: 7 mL/min — ABNORMAL LOW (ref >90)
GFR calc non Af Amer: 6 mL/min — ABNORMAL LOW (ref >90)
Sodium: 136 mEq/L (ref 135–145)

## 2012-04-27 LAB — GLUCOSE, CAPILLARY
Glucose-Capillary: 83 mg/dL (ref 70–99)
Glucose-Capillary: 85 mg/dL (ref 70–99)
Glucose-Capillary: 159 mg/dL — ABNORMAL HIGH (ref 70–99)

## 2012-04-27 MED ORDER — PARICALCITOL 5 MCG/ML IV SOLN
INTRAVENOUS | Status: AC
  Filled 2012-04-27: qty 2

## 2012-04-27 MED ORDER — INSULIN ASPART PROT & ASPART (70-30 MIX) 100 UNIT/ML ~~LOC~~ SUSP
9.0000 [IU] | Freq: Every day | SUBCUTANEOUS | Status: DC
  Administered 2012-04-27 – 2012-04-28 (×2): 9 [IU] via SUBCUTANEOUS
  Filled 2012-04-27: qty 3

## 2012-04-27 MED ORDER — RIFAMPIN 300 MG PO CAPS
300.0000 mg | ORAL_CAPSULE | Freq: Two times a day (BID) | ORAL | Status: DC
  Administered 2012-04-27 – 2012-04-29 (×4): 300 mg via ORAL
  Filled 2012-04-27 (×5): qty 1

## 2012-04-27 MED ORDER — INSULIN ASPART PROT & ASPART (70-30 MIX) 100 UNIT/ML ~~LOC~~ SUSP
10.0000 [IU] | Freq: Every day | SUBCUTANEOUS | Status: DC
  Administered 2012-04-28 – 2012-04-29 (×2): 10 [IU] via SUBCUTANEOUS
  Filled 2012-04-27: qty 3

## 2012-04-27 NOTE — Progress Notes (Signed)
ANTIBIOTIC CONSULT NOTE - FOLLOW UP  Pharmacy Consult for Vancomycin + Ceftazidime Indication: Acinetobacter Bacteremia and empiric wound coverage  Allergies  Allergen Reactions  . Percocet (Oxycodone-Acetaminophen) Other (See Comments)    GI UPSET  . Soap Itching    IVORY SOAP    Patient Measurements: Height:  (bilateral aka) Weight: 190 lb 7.6 oz (86.4 kg)  Vital Signs: Temp: 98.1 F (36.7 C) (08/28 1245) Temp src: Oral (08/28 1245) BP: 148/81 mmHg (08/28 1245) Pulse Rate: 100  (08/28 1245) Intake/Output from previous day: 08/27 0701 - 08/28 0700 In: 460 [P.O.:460] Out: -  Intake/Output from this shift: Total I/O In: 278 [Blood:278] Out: -   Labs:  Basename 04/27/12 0915 04/27/12 0900 04/26/12 0855 04/25/12 0550  WBC -- 13.5* 17.2* 20.6*  HGB -- 7.1* 7.8* 8.2*  PLT -- 248 217 186  LABCREA -- -- -- --  CREATININE 7.18* -- 5.81* 7.45*   The CrCl is unknown because both a height and weight (above a minimum accepted value) are required for this calculation. No results found for this basename: VANCOTROUGH:2,VANCOPEAK:2,VANCORANDOM:2,GENTTROUGH:2,GENTPEAK:2,GENTRANDOM:2,TOBRATROUGH:2,TOBRAPEAK:2,TOBRARND:2,AMIKACINPEAK:2,AMIKACINTROU:2,AMIKACIN:2, in the last 72 hours   Microbiology: Recent Results (from the past 720 hour(s))  CULTURE, BLOOD (ROUTINE X 2)     Status: Normal   Collection Time   04/24/12 11:15 PM      Component Value Range Status Comment   Specimen Description BLOOD RIGHT FOREARM   Final    Special Requests BOTTLES DRAWN AEROBIC ONLY 10CC   Final    Culture  Setup Time 04/25/2012 08:46   Final    Culture     Final    Value: ACINETOBACTER CALCOACETICUS/BAUMANNII COMPLEX     Note: CRITICAL RESULT CALLED TO, READ BACK BY AND VERIFIED WITH: VALENCIA M AT 6213 04/25/12 BY SNOLO   Report Status 04/27/2012 FINAL   Final    Organism ID, Bacteria ACINETOBACTER CALCOACETICUS/BAUMANNII COMPLEX   Final   CULTURE, BLOOD (ROUTINE X 2)     Status: Normal  (Preliminary result)   Collection Time   04/24/12 11:30 PM      Component Value Range Status Comment   Specimen Description BLOOD RIGHT HAND   Final    Special Requests BOTTLES DRAWN AEROBIC AND ANAEROBIC 5CC EA   Final    Culture  Setup Time 04/25/2012 08:46   Final    Culture     Final    Value:        BLOOD CULTURE RECEIVED NO GROWTH TO DATE CULTURE WILL BE HELD FOR 5 DAYS BEFORE ISSUING A FINAL NEGATIVE REPORT   Report Status PENDING   Incomplete   MRSA PCR SCREENING     Status: Abnormal   Collection Time   04/25/12 12:03 AM      Component Value Range Status Comment   MRSA by PCR POSITIVE (*) NEGATIVE Final   WOUND CULTURE     Status: Normal (Preliminary result)   Collection Time   04/26/12 12:12 PM      Component Value Range Status Comment   Specimen Description WOUND LEFT THIGH   Final    Special Requests Immunocompromised   Final    Gram Stain     Final    Value: FEW WBC PRESENT,BOTH PMN AND MONONUCLEAR     NO SQUAMOUS EPITHELIAL CELLS SEEN     RARE GRAM POSITIVE COCCI     IN PAIRS   Culture Culture reincubated for better growth   Final    Report Status PENDING   Incomplete  Anti-infectives     Start     Dose/Rate Route Frequency Ordered Stop   04/25/12 1200   vancomycin (VANCOCIN) IVPB 1000 mg/200 mL premix        1,000 mg 200 mL/hr over 60 Minutes Intravenous Every M-W-F (Hemodialysis) 04/25/12 0022     04/25/12 1200   cefTAZidime (FORTAZ) 2 g in dextrose 5 % 50 mL IVPB        2 g 100 mL/hr over 30 Minutes Intravenous Every M-W-F (Hemodialysis) 04/25/12 0022     04/25/12 0130   vancomycin (VANCOCIN) 2,000 mg in sodium chloride 0.9 % 500 mL IVPB        2,000 mg 250 mL/hr over 120 Minutes Intravenous  Once 04/25/12 0021 04/25/12 0328   04/25/12 0100   cefTAZidime (FORTAZ) 2 g in dextrose 5 % 50 mL IVPB        2 g 100 mL/hr over 30 Minutes Intravenous  Once 04/25/12 0021 04/25/12 0114          Assessment: 41 y.o. F with ESRD admitted on 8/25 and started on  Vancomycin + Ceftazidime D#3 for r/o sepsis. The patient is now found to have Acinetobacter growing in 1/2 blood cultures -- sensitivities show that it is sensitive to Ceftaz. The patient is also on Vancomycin for empiric wound coverage -- the cultures have not been reported on yet but gram stain shows GPC.   The patient was loaded appropriately with Vancomycin and Ceftaz on 8/26 and then received extra doses prior to HD that evening with none charted afterwards. The patient's levels were expected to still be within range after dialysis on 8/26 (extra dose just given pre rather than post dialysis). The patient received HD this morning but did not receive antibiotics in the HD unit -- they are to be given on the floor (nurse aware). Doses remain appropriate at this time.   Goal of Therapy:  Pre-HD Vancomycin level of 15-25 mcg/ml  Plan:  1. Continue Vancomycin 1g IV post HD sessions on M/W/F 2. Continue Ceftazidime 2g IV post HD sessions on M/W/F 3. Will plan to check pre-HD Vancomycin level prior to HD session on Friday, 8/30 4. Will continue to follow HD schedule/duration, culture results, LOT, and antibiotic de-escalation plans   Georgina Pillion, PharmD, BCPS Clinical Pharmacist Pager: [phone] 04/27/2012 1:53 PM

## 2012-04-27 NOTE — Progress Notes (Signed)
INFECTIOUS DISEASE ATTENDING ADDENDUM:     [facility]   Date: 04/27/2012  Patient name: [name]  Medical record number: [id number]  Date of birth: 07/29/1971    This patient has been seen and discussed with the house staff. Please see their note for complete details. I concur with their findings with the following additions/corrections:  41 year old who came to the ED with N,V and bleeding from ehr left femoral loop AV Gore-Tex graft that was repaired by Dr. Arbie Cookey on 04/24/12 she was admitted later that day to [facility] with persistent N and V, fever and leukocytosis. BLood cultures were drawn and pt placed on vancomycin and ceftazidime and one of two cultures (right forearm( is growing a relatively sensitive Acinetobacter species while the other culture is not. In the interim she developed some purulent discharge from this wound that Dr Arlean Hopping sent for culture. This has shown GPC pairs on gram stain with culture incubating.   It is NOT yet clear to me that the patient has a true bacteremia (with only one of two blood cultures positive for acinetobacter) or graft infection from the acinetobacter or from the GPositive organism seen at the graft site. However given that this last access site left to her has bled and there is high concern for infection at the graft it may be prudent to continue to treat for posible graft infection. I will add rifampin given that we have prosthetic material in the Gore tex graft. Continue the vancomycin and ceftazidime for now (though a carbapenem might be more active, the ceftaz will be easier to dose and we are not dealing with a clear cut infection based on data available to me)      Paulette Blanch Dam 04/27/2012, 5:20 PM

## 2012-04-27 NOTE — Progress Notes (Signed)
Subjective:  Feeling better, no more abd pain.   Objective Vital signs in last 24 hours: Filed Vitals:   04/27/12 1030 04/27/12 1100 04/27/12 1130 04/27/12 1145  BP: 115/69 115/60 127/72 121/67  Pulse: 91 89 99   Temp:   97.4 F (36.3 C) 98 F (36.7 C)  TempSrc:   Oral Oral  Resp: 13 13 20 21   Weight:      SpO2:       Weight change: -3.56 kg (-7 lb 13.6 oz)  Intake/Output Summary (Last 24 hours) at 04/27/12 1203 Last data filed at 04/27/12 1130  Gross per 24 hour  Intake    618 ml  Output      0 ml  Net    618 ml   Labs: Basic Metabolic Panel:  Lab 04/27/12 7829 04/26/12 0855 04/25/12 0550  NA 136 136 132*  K 3.9 3.6 4.2  CL 95* 94* 91*  CO2 26 26 24   GLUCOSE 99 280* 318*  BUN 27* 22 35*  CREATININE 7.18* 5.81* 7.45*  CALCIUM 9.0 9.4 8.5  ALB -- -- --  PHOS 5.8* -- --   Liver Function Tests:  Lab 04/27/12 0915 04/25/12 0550 04/24/12 1900  AST -- 20 20  ALT -- 6 9  ALKPHOS -- 82 73  BILITOT -- 0.2* 0.2*  PROT -- 6.7 6.6  ALBUMIN 2.7* 2.6* 2.6*    Lab 04/24/12 1900  LIPASE 21  AMYLASE --   No results found for this basename: AMMONIA:3 in the last 168 hours CBC:  Lab 04/27/12 0900 04/26/12 0855 04/25/12 0550 04/24/12 1900 04/24/12 0707  WBC 13.5* 17.2* 20.6* -- --  NEUTROABS 9.1* 13.7* 17.4* -- --  HGB 7.1* 7.8* 8.2* -- --  HCT 20.6* 22.6* 22.7* -- --  MCV 82.7 81.6 81.4 81.5 85.4  PLT 248 217 186 -- --   Cardiac Enzymes: No results found for this basename: CKTOTAL:5,CKMB:5,CKMBINDEX:5,TROPONINI:5 in the last 168 hours CBG:  Lab 04/27/12 0740 04/26/12 2246 04/26/12 2205 04/26/12 2203 04/26/12 1627  GLUCAP 85 83 35* 30* 139*    Iron Studies: No results found for this basename: IRON,TIBC,TRANSFERRIN,FERRITIN in the last 72 hours Studies/Results: No results found. Medications:      . aspirin EC  81 mg Oral Daily  . calcium acetate  667 mg Oral TID WC  . cefTAZidime (FORTAZ)  IV  2 g Intravenous Q M,W,F-HD  . Chlorhexidine Gluconate Cloth   6 each Topical q morning - 10a  . darbepoetin (ARANESP) injection - DIALYSIS  100 mcg Intravenous Q Mon-HD  . ferric gluconate (FERRLECIT/NULECIT) IV  125 mg Intravenous Q Wed-HD  . insulin aspart  0-20 Units Subcutaneous TID WC  . insulin aspart  0-5 Units Subcutaneous QHS  . insulin aspart protamine-insulin aspart  10 Units Subcutaneous Q breakfast  . insulin aspart protamine-insulin aspart  9 Units Subcutaneous Q supper  . multivitamin  1 tablet Oral QHS  . mupirocin ointment  1 application Nasal BID  . olopatadine  1 drop Both Eyes BID  . pantoprazole  40 mg Oral Q1200  . paricalcitol      . paricalcitol  6 mcg Intravenous Q M,W,F-HD  . sodium chloride  3 mL Intravenous Q12H  . triamcinolone cream  1 application Topical BID  . trimethoprim-polymyxin b  1 drop Left Eye Q4H  . vancomycin  1,000 mg Intravenous Q M,W,F-HD  . DISCONTD: insulin aspart protamine-insulin aspart  18 Units Subcutaneous Q supper  . DISCONTD: insulin aspart protamine-insulin aspart  20 Units Subcutaneous Q breakfast   I  have reviewed scheduled and prn medications.  Physical Exam: General: Alert Obese BF , chronically ill appearance, Heart: RRR, 1/6 sem LSB, no rub Lungs: CTA  bilaterally Abdomen: Obese, nontender abdomen Ext: bilaterally AKAs, L thigh AVG +bruit, medial wound small open area with sutures present and draining hemopurulent liquid, sent for culture yest     Hemodialysis OP Orders=  [facility] , 4 hrs. ,edw 84.5 kg Left Femoral AVGG,  BFR 400. DFR 800,  EPO= 2,800 q hd , Infed 50 mg q weekly hd Zemplar 6.42mcg q hd  Heparin 7.0 ml q hd Home BP-  ? norvasc 10/d, not on hospital home med list Home binders, vit D- ? phoslo 1ac  Problems: 1. Gram neg bacteremia- may have avg infxn, +drainage sent for cx, abx, asked [facility] to see. Limited access options is my understanding 2. Abd pain- resolved. CT and [place] on Sat were neg 3. ESRD - mwf gkc, use tight hep, at dry wt 4. Anemia- cont epo, fe 5. HPTH- cont  binders, vit D 6. HTN- no meds per admit list 7. 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

## 2012-04-27 NOTE — Progress Notes (Addendum)
[facility] PROGRESS NOTE  [name] NWG:[id number] DOB: 1971/05/26 DOA: 04/24/2012 PCP: Lieutenant Diego, MD  Assessment/Plan: Principal Problem:  *Nausea and vomiting Active Problems:  Leucocytosis  ESRD (end stage renal disease) on dialysis  Diabetes mellitus  Anemia  Bacteremia  1. Nausea and Vomiting, improved:  Advance diet to full 2. Bacteremia: BCx with Acinetobacter which is relatively sensitive.  Pt presently on ceftazadine which should be effective.  ID consulted to determine length of treatment.  Daily BC until negative.  3. Discharge from left leg graft site with GPC:  Awaiting speciation on vancomycin.  ID to assist with duration of treatment.  Patient should be able to receive antibiotics at HD. 4. DM II: BS elevated. Blood sugar is stable to low.  Continue current therapy. 5. ESRD:  HD Wed (today) 6. S/P repair of left femoral AV graft:  Patient with very limited IV access and need to preserve the left leg fistula.  Appreciate vascular assistance.   7. Leukocytosis:  Likely related to underlying infection and improving on antibiotics. 8. Anemia:  LIkely due to renal disease and recent bleeding from graft site.  No need for transfusion at this time.  Epo-stimulation per nephrology. 9. Hypertension:  Monitor for now given acute illness.     Code Status: Full Family Communication: N/A Disposition Plan: Likely home at time of discharge  [name], [facility]  [facility] Pager [phone]. If 8PM-8AM, please contact night-coverage at [email or web address], password [facility] 04/27/2012, 5:12 PM  LOS: 3 days   Brief narrative: 41 year-old female with known history of ESRD on hemodialysis on Monday Wednesday Friday was discharged home today morning after being treated for left AV graft bleeding presented back to the ER because of persistent nausea and vomiting. Patient states she has been having these symptoms for last 2 days with epigastric pain. Denies any chest pain or  shortness of breath fever chills or any diarrhea. In the ER CT abdomen pelvis does not show any acute. Patient has been admitted for further management. Patient was found to be having leukocytosis and mild fever.   Consultants:  Nephrology  Procedures:  None  Antibiotics:  Elita Quick  8/26 >>  Vancomycin 8/26 >>  HPI/Subjective: Pt states that her nausea and vomiting are improved.  Denies diarrhea, constipation.  Abdominal pain is improved.  Denies chest pain, cough, shortness of breath.  No new skin lesions or wounds.  Decreased drainage from left leg graft.  Thirsty  Objective: Filed Vitals:   04/27/12 1227 04/27/12 1245 04/27/12 1253 04/27/12 1500  BP: 150/82 148/81 148/80 138/84  Pulse: 101 100 100 103  Temp: 98.2 F (36.8 C) 98.1 F (36.7 C) 98.3 F (36.8 C) 100.3 F (37.9 C)  TempSrc: Oral Oral Oral Oral  Resp: 14 13 14 16   Weight:   84.4 kg (186 lb 1.1 oz)   SpO2:   92% 97%   Weight change: -3.56 kg (-7 lb 13.6 oz)  Intake/Output Summary (Last 24 hours) at 04/27/12 1712 Last data filed at 04/27/12 1253  Gross per 24 hour  Intake    848 ml  Output   1927 ml  Net  -1079 ml    General: Alert, awake, oriented x3, in no acute distress.  HEENT: [place]/AT PEERL, EOMI,  Neck: Trachea midline,  Supple OROPHARYNX:  Tachy mucous membranes, No exudate/ erythema/lesions.  Heart: Regular rate and rhythm.  Lungs: Clear to auscultation. Abdomen: Soft, nontender, nondistended, positive bowel sounds, no masses no hepatosplenomegaly noted.  Neuro: No focal neurological  deficits noted  Ext:  Left leg with bandages with small amount of clear yellow green discharge over weeping area (1cm ulcer).  No surrounding induration or erythema.  Bilateral AKA.    Data Reviewed: Basic Metabolic Panel:  Lab 04/27/12 3244 04/26/12 0855 04/25/12 0550 04/24/12 1900 04/24/12 1027 04/24/12 0732  NA 136 136 132* 133* 136 --  K 3.9 3.6 4.2 3.9 4.0 --  CL 95* 94* 91* 92* -- 93*  CO2 26 26 24 28  --  --  GLUCOSE 99 280* 318* 278* 415* --  BUN 27* 22 35* 27* -- 21  CREATININE 7.18* 5.81* 7.45* 6.89* -- 5.70*  CALCIUM 9.0 9.4 8.5 8.4 -- --  MG -- -- -- -- -- --  PHOS 5.8* -- -- -- -- --   Liver Function Tests:  Lab 04/27/12 0915 04/25/12 0550 04/24/12 1900  AST -- 20 20  ALT -- 6 9  ALKPHOS -- 82 73  BILITOT -- 0.2* 0.2*  PROT -- 6.7 6.6  ALBUMIN 2.7* 2.6* 2.6*    Lab 04/24/12 1900  LIPASE 21  AMYLASE --   No results found for this basename: AMMONIA:5 in the last 168 hours CBC:  Lab 04/27/12 0900 04/26/12 0855 04/25/12 0550 04/24/12 1900 04/24/12 1027 04/24/12 0707  WBC 13.5* 17.2* 20.6* 23.8* -- 33.5*  NEUTROABS 9.1* 13.7* 17.4* 20.5* -- 29.9*  HGB 7.1* 7.8* 8.2* 8.3* 9.5* --  HCT 20.6* 22.6* 22.7* 23.4* 28.0* --  MCV 82.7 81.6 81.4 81.5 -- 85.4  PLT 248 217 186 170 -- 211   Cardiac Enzymes: No results found for this basename: CKTOTAL:5,CKMB:5,CKMBINDEX:5,TROPONINI:5 in the last 168 hours BNP (last 3 results) No results found for this basename: PROBNP:3 in the last 8760 hours CBG:  Lab 04/27/12 1644 04/27/12 0740 04/26/12 2246 04/26/12 2205 04/26/12 2203  GLUCAP 159* 85 83 35* 30*    Recent Results (from the past 240 hour(s))  CULTURE, BLOOD (ROUTINE X 2)     Status: Normal   Collection Time   04/24/12 11:15 PM      Component Value Range Status Comment   Specimen Description BLOOD RIGHT FOREARM   Final    Special Requests BOTTLES DRAWN AEROBIC ONLY 10CC   Final    Culture  Setup Time 04/25/2012 08:46   Final    Culture     Final    Value: ACINETOBACTER CALCOACETICUS/BAUMANNII COMPLEX     Note: CRITICAL RESULT CALLED TO, READ BACK BY AND VERIFIED WITH: VALENCIA M AT 0102 04/25/12 BY SNOLO   Report Status 04/27/2012 FINAL   Final    Organism ID, Bacteria ACINETOBACTER CALCOACETICUS/BAUMANNII COMPLEX   Final   CULTURE, BLOOD (ROUTINE X 2)     Status: Normal (Preliminary result)   Collection Time   04/24/12 11:30 PM      Component Value Range Status Comment    Specimen Description BLOOD RIGHT HAND   Final    Special Requests BOTTLES DRAWN AEROBIC AND ANAEROBIC 5CC EA   Final    Culture  Setup Time 04/25/2012 08:46   Final    Culture     Final    Value:        BLOOD CULTURE RECEIVED NO GROWTH TO DATE CULTURE WILL BE HELD FOR 5 DAYS BEFORE ISSUING A FINAL NEGATIVE REPORT   Report Status PENDING   Incomplete   MRSA PCR SCREENING     Status: Abnormal   Collection Time   04/25/12 12:03 AM      Component Value  Range Status Comment   MRSA by PCR POSITIVE (*) NEGATIVE Final   WOUND CULTURE     Status: Normal (Preliminary result)   Collection Time   04/26/12 12:12 PM      Component Value Range Status Comment   Specimen Description WOUND LEFT THIGH   Final    Special Requests Immunocompromised   Final    Gram Stain     Final    Value: FEW WBC PRESENT,BOTH PMN AND MONONUCLEAR     NO SQUAMOUS EPITHELIAL CELLS SEEN     RARE GRAM POSITIVE COCCI     IN PAIRS   Culture Culture reincubated for better growth   Final    Report Status PENDING   Incomplete      Studies: Ct Abdomen Pelvis Wo Contrast  04/24/2012  *RADIOLOGY REPORT*  Clinical Data: Abdominal pain, nausea and vomiting.  CT ABDOMEN AND PELVIS WITHOUT CONTRAST  Technique:  Multidetector CT imaging of the abdomen and pelvis was performed following the standard protocol without intravenous contrast.  Comparison: CT abdomen and pelvis 06/30/2009.  Findings: Linear basilar atelectasis is seen bilaterally.  No pleural or pericardial effusion.  There may be a few tiny stones within the gallbladder but no CT evidence of cholecystitis is identified.  The liver, spleen, adrenal glands and pancreas are unremarkable.  There is marked renal atrophy bilaterally with cystic change and calcifications consistent with chronic renal failure.  The patient has extensive atherosclerotic vascular disease.  Uterus, adnexa and urinary bladder are unremarkable.  The stomach, small and large bowel and appendix all appear normal.   No lymphadenopathy or fluid is seen. There is no focal bony abnormality.  IMPRESSION:  1.  No acute finding. 2.  Possible tiny gallstones.  No evidence of cholecystitis. 3.  Findings consistent with chronic renal failure.   Original Report Authenticated By: Bernadene Bell. Maricela Curet, M.D.    US Abdomen Complete  04/25/2012  *RADIOLOGY REPORT*  Clinical Data:  Vomiting, abdominal pain, question cholecystitis  ULTRASOUND ABDOMEN:  Technique:  Sonography of upper abdominal structures was performed. Image quality degraded by patient body habitus  Comparison:  None Correlation:  CT abdomen 04/24/2012  Gallbladder:  Tiny intraluminal echogenic focus without definite posterior shadowing within gallbladder question tiny nonshadowing calculus versus polyp.  Gallbladder wall normal thickness.  No pericholecystic fluid or sonographic Murphy's sign.  Common bile duct:  Normal caliber 3 mm diameter  Liver:  Normal appearance  IVC:  Normal appearance  Pancreas:  Head and tail incompletely visualized, partially obscured by bowel gas, with visualized portions normal appearance  Spleen:  Unremarkable  Right kidney:  8.3 cm length.  Cortical thinning.  Hypoechoic nodule at inferior pole 1.1 x 1.2 x 1.7 cm, by CT a small cyst. No solid mass or hydronephrosis.  Left kidney:  7.2 cm length.  Diffuse cortical thinning.  Increased cortical echogenicity.  Hypoechoic nodule at midportion 9 x 8 x 14 mm, by CT a small cyst.  No hydronephrosis or solid mass.  Aorta:  Bifurcation obscured by bowel gas.  Visualized portions normal caliber.  Other:  No free fluid  IMPRESSION: Atrophic kidneys consistent with end-stage renal disease. Small bilateral renal cysts. Question tiny gallstones versus gallbladder polyps without sonographic evidence of acute cholecystitis.   Original Report Authenticated By: Lollie Marrow, M.D.    Dg Chest Port 1 View  04/25/2012  *RADIOLOGY REPORT*  Clinical Data: Shortness of breath.  "Rule out infiltrates."  PORTABLE  CHEST - 1 VIEW  Comparison: 01/14/2010  Findings: Midline trachea.  Mild cardiomegaly. No pleural effusion or pneumothorax.  Left greater than right bibasilar airspace disease. No congestive failure.  IMPRESSION: Left greater than right bibasilar airspace disease.  Especially on the left, infection or aspiration cannot be excluded.  Favor atelectasis on the right.  Cardiomegaly without congestive failure.   Original Report Authenticated By: Consuello Bossier, M.D.     Scheduled Meds:    . aspirin EC  81 mg Oral Daily  . calcium acetate  667 mg Oral TID WC  . cefTAZidime (FORTAZ)  IV  2 g Intravenous Q M,W,F-HD  . Chlorhexidine Gluconate Cloth  6 each Topical q morning - 10a  . darbepoetin (ARANESP) injection - DIALYSIS  100 mcg Intravenous Q Mon-HD  . ferric gluconate (FERRLECIT/NULECIT) IV  125 mg Intravenous Q Wed-HD  . insulin aspart  0-20 Units Subcutaneous TID WC  . insulin aspart  0-5 Units Subcutaneous QHS  . insulin aspart protamine-insulin aspart  10 Units Subcutaneous Q breakfast  . insulin aspart protamine-insulin aspart  9 Units Subcutaneous Q supper  . multivitamin  1 tablet Oral QHS  . mupirocin ointment  1 application Nasal BID  . olopatadine  1 drop Both Eyes BID  . pantoprazole  40 mg Oral Q1200  . paricalcitol      . paricalcitol  6 mcg Intravenous Q M,W,F-HD  . rifampin  300 mg Oral Q12H  . sodium chloride  3 mL Intravenous Q12H  . triamcinolone cream  1 application Topical BID  . trimethoprim-polymyxin b  1 drop Left Eye Q4H  . vancomycin  1,000 mg Intravenous Q M,W,F-HD  . DISCONTD: insulin aspart protamine-insulin aspart  18 Units Subcutaneous Q supper  . DISCONTD: insulin aspart protamine-insulin aspart  20 Units Subcutaneous Q breakfast   Continuous Infusions:   Principal Problem:  *Nausea and vomiting Active Problems:  Leucocytosis  ESRD (end stage renal disease) on dialysis  Diabetes mellitus  Anemia  Bacteremia

## 2012-04-27 NOTE — Progress Notes (Signed)
Utilization Review Completed.[name] T8/28/2013   

## 2012-04-27 NOTE — Procedures (Signed)
I was present at this dialysis session. I have reviewed the session itself and made appropriate changes.   Vinson Moselle, MD [employer] 04/27/2012, 12:07 PM

## 2012-04-27 NOTE — Progress Notes (Addendum)
VASCULAR AND VEIN SURGERY PROGRESS NOTE  POST-OP HEMODIALYSIS ACCESS  Date of Surgery: 04/24/12 Replacement of venous limb of the AV Gore-Tex graft left femoral loop  HPI: [name] is a 41 y.o. female who on 04/24/12 had Replacement of venous limb of the AV Gore-Tex graft left femoral loop by Dr. Arbie Cookey for Bleeding from  the false aneurysm venous limb of left femoral loop AV Gore-Tex graft. she clearly had an erosion into the graft to area of false aneurysm. She was taken urgently to the operating room for repair on that day. The area of the old graft with the erosion was excluded and new graft was tunneled around old graft. Attention was turned to the area of graft disruption. This was debrided. There was no evidence of posterior  Hematoma in  this area where she had a false aneurysm. 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 The wound that was debrided and the skin edges were also debrided back to viable tissue. Pt was readmitted from the ER with 2 day history of persistent nausea and vomiting. Patient states she has had fever to 101. She denies chills. Dr Arlean Hopping states he looked at open wound and was able to express some purulent material which was cultured.    Significant Diagnostic Studies: CBC Lab Results  Component Value Date   WBC 13.5* 04/27/2012   HGB 7.1* 04/27/2012   HCT 20.6* 04/27/2012   MCV 82.7 04/27/2012   PLT 248 04/27/2012    BMET    Component Value Date/Time   NA 136 04/27/2012 0915   K 3.9 04/27/2012 0915   CL 95* 04/27/2012 0915   CO2 26 04/27/2012 0915   GLUCOSE 99 04/27/2012 0915   BUN 27* 04/27/2012 0915   CREATININE 7.18* 04/27/2012 0915   CALCIUM 9.0 04/27/2012 0915   GFRNONAA 6* 04/27/2012 0915   GFRAA 7* 04/27/2012 0915    COAG Lab Results  Component Value Date   INR 1.19 04/24/2012   INR 1.07 11/08/2009   INR 1.05 09/19/2009   No results found for this basename: PTT    Vital Signs  BP Readings from Last  3 Encounters:  04/27/12 148/80  04/24/12 118/67  04/24/12 118/67   Temp Readings from Last 3 Encounters:  04/27/12 98.3 F (36.8 C) Oral  04/24/12 100.5 F (38.1 C)   04/24/12 100.5 F (38.1 C)    SpO2 Readings from Last 3 Encounters:  04/27/12 92%  04/24/12 97%  04/24/12 97%   Pulse Readings from Last 3 Encounters:  04/27/12 100  04/24/12 108  04/24/12 108     Physical Examination  left lower Incision is healing well, skin color is normal , There is no erythema or tenderness at site The area where ulcer from old graft was debrided is open,stitch is gone, wound is clean and had min serous drainage on dressing. No fluctuance around wound Assessment/Plan [name] is a 41 y.o. year old female who presents s/p /revision of left lower extremity Hemodialysis access. Middle incision where debridement of ulcer was done is open and looks clean, without erythema and min drainage. It does not look overtly infected.  Cultures pending from wound - gram stain few G+ cocci Continue local care and antibiotics Pt has limited options for access  [name],[name] 04/27/2012 2:38 PM  I have examined the patient, reviewed and agree with above. The patient does have a patent left femoral loop graft. She has  having successful dialysis via the lateral arterial limb of this. She had erosion of bleed from the venous aspect. I rerouted around this. The area that had bled was debrided in the operating room and had no prosthetic graft was exposed. This was closed with a single 3-0 nylon suture. There is no erythema and no evidence of infection. Discussed with Dr.Schertz  Felicie Kocher, MD 04/27/2012 3:31 PM

## 2012-04-28 DIAGNOSIS — R7881 Bacteremia: Secondary | ICD-10-CM

## 2012-04-28 DIAGNOSIS — A498 Other bacterial infections of unspecified site: Secondary | ICD-10-CM

## 2012-04-28 DIAGNOSIS — E119 Type 2 diabetes mellitus without complications: Secondary | ICD-10-CM

## 2012-04-28 DIAGNOSIS — T827XXA Infection and inflammatory reaction due to other cardiac and vascular devices, implants and grafts, initial encounter: Secondary | ICD-10-CM

## 2012-04-28 DIAGNOSIS — N186 End stage renal disease: Secondary | ICD-10-CM

## 2012-04-28 DIAGNOSIS — E860 Dehydration: Secondary | ICD-10-CM

## 2012-04-28 LAB — TYPE AND SCREEN
ABO/RH(D): A POS
Antibody Screen: NEGATIVE
Unit division: 0
Unit division: 0
Unit division: 0
Unit division: 0

## 2012-04-28 LAB — GLUCOSE, CAPILLARY
Glucose-Capillary: 248 mg/dL — ABNORMAL HIGH (ref 70–99)
Glucose-Capillary: 80 mg/dL (ref 70–99)
Glucose-Capillary: 83 mg/dL (ref 70–99)

## 2012-04-28 MED ORDER — HYDRALAZINE HCL 20 MG/ML IJ SOLN
10.0000 mg | INTRAMUSCULAR | Status: DC | PRN
  Filled 2012-04-28: qty 0.5

## 2012-04-28 MED ORDER — AMLODIPINE BESYLATE 10 MG PO TABS
10.0000 mg | ORAL_TABLET | Freq: Every day | ORAL | Status: DC
  Administered 2012-04-28 – 2012-04-29 (×2): 10 mg via ORAL
  Filled 2012-04-28 (×2): qty 1

## 2012-04-28 NOTE — Progress Notes (Signed)
Order received, chart reviewed, noted pt did well with PT earlier. Spoke to pt about roll of OT and she states she does not foresee any issues with BADLs at home, has all DME, and has A prn. No OT issues identified, will sign off.

## 2012-04-28 NOTE — Progress Notes (Signed)
[facility]    Date of Admission:  04/24/2012            Total days of antibiotics: 5 Day 5: Vancomycin  Day 5: Fortaz  Day: cefazolin once on 8/25  Principal Problem:  *Nausea and vomiting Active Problems:  Leucocytosis  ESRD (end stage renal disease) on dialysis  Diabetes mellitus  Anemia  Bacteremia   . aspirin EC  81 mg Oral Daily  . calcium acetate  667 mg Oral TID WC  . cefTAZidime (FORTAZ)  IV  2 g Intravenous Q M,W,F-HD  . Chlorhexidine Gluconate Cloth  6 each Topical q morning - 10a  . darbepoetin (ARANESP) injection - DIALYSIS  100 mcg Intravenous Q Mon-HD  . ferric gluconate (FERRLECIT/NULECIT) IV  125 mg Intravenous Q Wed-HD  . insulin aspart  0-20 Units Subcutaneous TID WC  . insulin aspart  0-5 Units Subcutaneous QHS  . insulin aspart protamine-insulin aspart  10 Units Subcutaneous Q breakfast  . insulin aspart protamine-insulin aspart  9 Units Subcutaneous Q supper  . multivitamin  1 tablet Oral QHS  . mupirocin ointment  1 application Nasal BID  . olopatadine  1 drop Both Eyes BID  . pantoprazole  40 mg Oral Q1200  . paricalcitol      . paricalcitol  6 mcg Intravenous Q M,W,F-HD  . rifampin  300 mg Oral Q12H  . sodium chloride  3 mL Intravenous Q12H  . triamcinolone cream  1 application Topical BID  . trimethoprim-polymyxin b  1 drop Left Eye Q4H  . vancomycin  1,000 mg Intravenous Q M,W,F-HD   Subjective: Pt is febrile and Tmax of 100.5.  Objective: Temp:  [97.4 F (36.3 C)-100.5 F (38.1 C)] 98.5 F (36.9 C) (08/29 1191) Pulse Rate:  [87-107] 107  (08/29 0613) Resp:  [12-21] 18  (08/29 0613) BP: (106-197)/(60-110) 178/94 mmHg (08/29 0655) SpO2:  [91 %-97 %] 96 % (08/29 0613) Weight:  [84.233 kg (185 lb 11.2 oz)-84.4 kg (186 lb 1.1 oz)] 84.233 kg (185 lb 11.2 oz) (08/28 2202) General: resting in bed, NAD  HEENT: PERRL, EOMI, no scleral icterus  Cardiac: RRR, no rubs, murmurs or gallops  Pulm: clear to auscultation  bilaterally  Abd: soft, nontender, nondistended, BS present  Ext: Amputed both legs. Dry and clean bandaged on left thigh on the A-V graft  Skin: No rash  Neuro: alert and oriented X3, cranial nerves II-XII grossly intact  Lab Results Lab Results  Component Value Date   WBC 13.5* 04/27/2012   HGB 7.1* 04/27/2012   HCT 20.6* 04/27/2012   MCV 82.7 04/27/2012   PLT 248 04/27/2012    Lab Results  Component Value Date   CREATININE 7.18* 04/27/2012   BUN 27* 04/27/2012   NA 136 04/27/2012   K 3.9 04/27/2012   CL 95* 04/27/2012   CO2 26 04/27/2012    Lab Results  Component Value Date   ALT 6 04/25/2012   AST 20 04/25/2012   ALKPHOS 82 04/25/2012   BILITOT 0.2* 04/25/2012     Microbiology: Recent Results (from the past 240 hour(s))  CULTURE, BLOOD (ROUTINE X 2)     Status: Normal   Collection Time   04/24/12 11:15 PM      Component Value Range Status Comment   Specimen Description BLOOD RIGHT FOREARM   Final    Special Requests BOTTLES DRAWN AEROBIC ONLY 10CC   Final    Culture  Setup Time 04/25/2012 08:46  Final    Culture     Final    Value: ACINETOBACTER CALCOACETICUS/BAUMANNII COMPLEX     Note: CRITICAL RESULT CALLED TO, READ BACK BY AND VERIFIED WITH: VALENCIA M AT 1841 04/25/12 BY SNOLO   Report Status 04/27/2012 FINAL   Final    Organism ID, Bacteria ACINETOBACTER CALCOACETICUS/BAUMANNII COMPLEX   Final   CULTURE, BLOOD (ROUTINE X 2)     Status: Normal (Preliminary result)   Collection Time   04/24/12 11:30 PM      Component Value Range Status Comment   Specimen Description BLOOD RIGHT HAND   Final    Special Requests BOTTLES DRAWN AEROBIC AND ANAEROBIC 5CC EA   Final    Culture  Setup Time 04/25/2012 08:46   Final    Culture     Final    Value:        BLOOD CULTURE RECEIVED NO GROWTH TO DATE CULTURE WILL BE HELD FOR 5 DAYS BEFORE ISSUING A FINAL NEGATIVE REPORT   Report Status PENDING   Incomplete   MRSA PCR SCREENING     Status: Abnormal   Collection Time   04/25/12 12:03 AM        Component Value Range Status Comment   MRSA by PCR POSITIVE (*) NEGATIVE Final   WOUND CULTURE     Status: Normal (Preliminary result)   Collection Time   04/26/12 12:12 PM      Component Value Range Status Comment   Specimen Description WOUND LEFT THIGH   Final    Special Requests Immunocompromised   Final    Gram Stain     Final    Value: FEW WBC PRESENT,BOTH PMN AND MONONUCLEAR     NO SQUAMOUS EPITHELIAL CELLS SEEN     RARE GRAM POSITIVE COCCI     IN PAIRS   Culture Culture reincubated for better growth   Final    Report Status PENDING   Incomplete    Assessment and Plan  Bacteremia: [name] is a 41 y.o. female PMH of IDDM , ESRD HD MWF schedule, h/o multiple access in both arms and both legs for HD, 1 out of 2 blood culture from 04/24/12 grows for ACINETOBACTER CALCOACETICUS which is pen sensitive. MRSA PCR positive and wound culture from left thigh (Av graft) grows GRAM POSITIVE COCCI and sensitivity pending. Concern for bacteremia and infection of AV graft. Empirically started vancomycin and Fortaz on 8/26.   1. Continue vancomycin for gram positive cocci coverage  2. Continue  3. Continue Rifampin (RIFADIN) to protect prosthetic material on graft 4. F/u blood culture  5. F/u CBC 6. Consider Switch to Imipenem from Foratz (MIC for Imipenem <=0.25) for better Acinetobacter coverage pending B/c grows same organism    Akter, Nasrin 04/28/2012, 9:01 AM     INFECTIOUS DISEASE ATTENDING ADDENDUM:     [facility]   Date: 04/28/2012  Patient name: [name]  Medical record number: [id number]  Date of birth: 1971-05-10    This patient has been seen and discussed with the house staff. Please see their note for complete details. I concur with their findings with the following additions/corrections:  I am comfortable with the patients current antibiotic regimen of IV vancomycin and ceftazidime (both can be dosed with HD) and rifampin to  treat for possible graft infection.   We need to followup ID of organims from graft wound  Antibiotics will need to be given through 101013.  I will be happy to  followup with the patient towards the end of her course of therapy.  Acey Lav 04/28/2012, 5:48 PM

## 2012-04-28 NOTE — Progress Notes (Signed)
[facility] Progress Note  Subjective:  Wants to go home. Not nauseated.  HD without problems yesterday.  c/o pain from neck IV  Objective Filed Vitals:   04/28/12 2130 04/28/12 0626 04/28/12 0655 04/28/12 1022  BP: 197/110 194/90 178/94 185/99  Pulse: 107   92  Temp: 98.5 F (36.9 C)   98.6 F (37 C)  TempSrc: Oral   Oral  Resp: 18   20  Height:      Weight:      SpO2: 96%   97%   Physical Exam General: obese, poorly groomed, NAD talkative Heart: RRR Lungs: no wheezes or rales Abdomen: obese Extremities:bilateral AKA no significant edema Dialysis Access:  Left thigh graft patent; dressing intact on medial limb  Hemodialysis OP Orders= [facility] , 4 hrs. ,edw 84.5 kg Left Femoral AVGG, BFR 400. DFR 800, EPO= 2,800 q hd , Infed 50 mg q weekly hd Zemplar 6.90mcg q hd Heparin 7.0 ml q hd  Home BP- ? norvasc 10/d, not on hospital home med list  Home binders, vit D- ? phoslo 1ac   Assessment/Plan: 1. Acinetobaceter bacteremia 1/2 +; staph + cultures graft wound; hx MRSA PCR; on Vanc and fortaz, rifampin added; ID following to determine final antibiotic regimen; s/p replacement of venous limb of thigh graft secondary to erosion 8/25 2. ESRD - MWF plan for HD in am, if not D/c today 3. Anemia - Hgb 7.1 yesterday;  S/p 2 units PRBC earlier during admission; retransfuse if Hgb < 7 Friday. 4. Secondary hyperparathyroidism - continue current meds 5. HTN/volume - not on BP meds, resume home norvasc 10;  decrease volume on HD 6. Nutrition - renal diet 7. DM - per primary Sheffield Slider, PA-C [name] Beeper [phone]  04/28/2012,10:54 AM  LOS: 4 days   Patient seen and examined and agree with assessment and plan as above.  Mary Moselle  MD [facility] [phone] pgr    [phone] cell 04/28/2012, 1:14 PM   Additional Objective Labs: Basic Metabolic Panel:  Lab 04/27/12 7253 04/26/12 0855 04/25/12 0550  NA 136 136 132*  K 3.9 3.6 4.2   CL 95* 94* 91*  CO2 26 26 24   GLUCOSE 99 280* 318*  BUN 27* 22 35*  CREATININE 7.18* 5.81* 7.45*  CALCIUM 9.0 9.4 8.5  ALB -- -- --  PHOS 5.8* -- --   Liver Function Tests:  Lab 04/27/12 0915 04/25/12 0550 04/24/12 1900  AST -- 20 20  ALT -- 6 9  ALKPHOS -- 82 73  BILITOT -- 0.2* 0.2*  PROT -- 6.7 6.6  ALBUMIN 2.7* 2.6* 2.6*    Lab 04/24/12 1900  LIPASE 21  AMYLASE --   CBC:  Lab 04/27/12 0900 04/26/12 0855 04/25/12 0550 04/24/12 1900 04/24/12 0707  WBC 13.5* 17.2* 20.6* -- --  NEUTROABS 9.1* 13.7* 17.4* -- --  HGB 7.1* 7.8* 8.2* -- --  HCT 20.6* 22.6* 22.7* -- --  MCV 82.7 81.6 81.4 81.5 85.4  PLT 248 217 186 -- --   Blood Culture    Component Value Date/Time   SDES BLOOD ARM LEFT 04/27/2012 1545   SPECREQUEST BOTTLES DRAWN AEROBIC AND ANAEROBIC 10CC 04/27/2012 1545   CULT        BLOOD CULTURE RECEIVED NO GROWTH TO DATE CULTURE WILL BE HELD FOR 5 DAYS BEFORE ISSUING A FINAL NEGATIVE REPORT 04/27/2012 1545   REPTSTATUS PENDING 04/27/2012 1545   CBG:  Lab 04/28/12 0739 04/27/12 2153 04/27/12 1644  04/27/12 0740 04/26/12 2246  GLUCAP 248* 111* 159* 85 83  Medications:      . aspirin EC  81 mg Oral Daily  . calcium acetate  667 mg Oral TID WC  . cefTAZidime (FORTAZ)  IV  2 g Intravenous Q M,W,F-HD  . Chlorhexidine Gluconate Cloth  6 each Topical q morning - 10a  . darbepoetin (ARANESP) injection - DIALYSIS  100 mcg Intravenous Q Mon-HD  . ferric gluconate (FERRLECIT/NULECIT) IV  125 mg Intravenous Q Wed-HD  . insulin aspart  0-20 Units Subcutaneous TID WC  . insulin aspart  0-5 Units Subcutaneous QHS  . insulin aspart protamine-insulin aspart  10 Units Subcutaneous Q breakfast  . insulin aspart protamine-insulin aspart  9 Units Subcutaneous Q supper  . multivitamin  1 tablet Oral QHS  . mupirocin ointment  1 application Nasal BID  . olopatadine  1 drop Both Eyes BID  . pantoprazole  40 mg Oral Q1200  . paricalcitol      . paricalcitol  6 mcg Intravenous Q  M,W,F-HD  . rifampin  300 mg Oral Q12H  . sodium chloride  3 mL Intravenous Q12H  . triamcinolone cream  1 application Topical BID  . trimethoprim-polymyxin b  1 drop Left Eye Q4H  . vancomycin  1,000 mg Intravenous Q M,W,F-HD

## 2012-04-28 NOTE — Evaluation (Signed)
Physical Therapy Evaluation Patient Details Name: [name] MRN: [id number] DOB: 16-Oct-1981 Today's Date: 04/28/2012 Time: 7829-5621 PT Time Calculation (min): 19 min  PT Assessment / Plan / Recommendation Clinical Impression  Pt. was admitted with bleeding from AV graft site, ?bacteremia, dehydration, hypotension, and vomiting.  She is essentially independent at [facility] level in her home, except for assist with bathing.  She presents at or near her transfers level baseline, and would benefit from one more session of PT to assure she can transfer back into bed.  She did not want to proceed back to bed this am as she was so glad to be OOB.  Do not forsee that she will need any PT follow up upon DC    PT Assessment  Patient needs continued PT services    Follow Up Recommendations  No PT follow up    Barriers to Discharge None      Equipment Recommendations  None recommended by PT    Recommendations for Other Services     Frequency Min 2X/week    Precautions / Restrictions Precautions Precautions: Fall Restrictions Weight Bearing Restrictions: No   Pertinent Vitals/Pain No pain, no distress      Mobility  Bed Mobility Bed Mobility: Rolling Right;Rolling Left;Scooting to [facility] Rolling Right: 6: Modified independent (Device/Increase time) Rolling Left: 6: Modified independent (Device/Increase time) Scooting to [facility]: 7: Independent;Other (comment) (in long sitting with bed flat) Details for Bed Mobility Assistance: Pt. essentially moves self in bed with occasional use of rails, prefers all 4 rails up but denies ever having had a fall at home Transfers Transfers: Counselling psychologist Transfer: 6: Modified independent (Device/Increase time);To level surface Details for Transfer Assistance: pt. approaches chair anteriorly then pivots self around to seat hips in her w/c.  She states this is how she has transferred for a long time.  It is unconventional .  I  addressed posteriorly scooting into chair, but she prefers to continue to do it her way. Ambulation/Gait Ambulation/Gait Assistance: Not tested (comment) Assistive device: Other (Comment) (pt's manual wheelchair)    Exercises     PT Diagnosis: Other (comment) (difficulty transfering)  PT Problem List: Decreased activity tolerance;Decreased safety awareness PT Treatment Interventions: Wheelchair mobility training;Functional mobility training   PT Goals Acute Rehab PT Goals PT Goal Formulation: With patient Time For Goal Achievement: 05/05/12 Potential to Achieve Goals: Good Pt will Transfer Bed to Chair/Chair to Bed: with modified independence PT Transfer Goal: Bed to Chair/Chair to Bed - Progress: Goal set today Pt will Propel Wheelchair: 10 - 50 feet;with modified independence PT Goal: Propel Wheelchair - Progress: Goal set today  Visit Information  Last PT Received On: 04/28/12 Assistance Needed: +1    Subjective Data  Subjective: I gotta get home to my daughter Patient Stated Goal: take care of my daughter   Prior Functioning  Home Living Lives With: Daughter, age 42 Available Help at Discharge: Available PRN/intermittently;Family;Other (Comment) (sister) Type of Home: Apartment Home Access: Level entry Bathroom Shower/Tub: Walk-in shower;Curtain Bathroom Toilet: Standard Bathroom Accessibility: Yes How Accessible: Accessible via wheelchair Home Adaptive Equipment: Built-in shower seat;Wheelchair - manual;Wheelchair - powered Prior Function Level of Independence: Independent with assistive device(s);Needs assistance Needs Assistance: Bathing;Meal Prep;Light Housekeeping Bath: Minimal Light Housekeeping: Other (comment) Able to Take Stairs?: No (has hired help) Driving: No Vocation: On disability Communication Communication: No difficulties Dominant Hand: Left    Cognition  Overall Cognitive Status: Appears within functional limits for tasks  assessed/performed Arousal/Alertness: Awake/alert Orientation Level: Appears  intact for tasks assessed Behavior During Session: [facility] for tasks performed    Extremity/Trunk Assessment Right Upper Extremity Assessment RUE ROM/Strength/Tone: [facility] for tasks assessed Left Upper Extremity Assessment LUE ROM/Strength/Tone: [facility] for tasks assessed Right Lower Extremity Assessment RLE ROM/Strength/Tone: [facility] for tasks assessed (right AKA) Left Lower Extremity Assessment LLE ROM/Strength/Tone: WFL for tasks assessed (L AKA) Trunk Assessment Trunk Assessment: Normal   Balance    End of Session PT - End of Session Activity Tolerance: Patient tolerated treatment well Patient left: in chair;Other (comment);with call bell/phone within reach (in pt's w/c) Nurse Communication: Mobility status  GP     Ferman Hamming 04/28/2012, 10:01 AM Weldon Picking PT Acute Rehab Services [phone] Beeper [phone]

## 2012-04-28 NOTE — Progress Notes (Signed)
[facility] PROGRESS NOTE  [name] ZOX:[id number] DOB: [birth year] DOA: 04/24/2012 PCP: Mary Diego, MD  Assessment/Plan: Principal Problem:  *Nausea and vomiting Active Problems:  Leucocytosis  ESRD (end stage renal disease) on dialysis  Diabetes mellitus  Anemia  Bacteremia  1. Bacteremia: BCx with Acinetobacter which is relatively sensitive.  Pt presently on ceftazadine.  Had fever to 100.26F last night and patient felt flushed at the time.  Resolved with tylenol.  Recultured.  Spoke with Dr. Daiva Wang regarding fever.  Would like to continue ceftaz for 6 weeks from date of first negative blood culture. 2. Discharge from left leg graft site with Staph sp.:  Cont vancomycin.  Appreciate ID recommendations:  Rifampin and vancomycin x 6 weeks.  Patient to follow up with Dr. Daiva Wang in clinic close to end of course. 3. Diarrhea:  Likely due to rifampin initiation, but patient at risk for C. Diff.  Discussed with Dr. Daiva Wang who recommended holding off on C diff assay unless patient is having 4-5 watery BM as a positive test now may represent only colonization given not clinically acting like acute C. Diff infection.  Check CBC in AM.   4. DM II: blood sugars 80 - 248 in last 24 hours.   Continue current therapy.   5. ESRD:  HD MWF.  Appreciate nephrology assistance.  If ID plans are firm regarding choice and duration of antibiotics, will talk to nephrology about discharge planning without outpatient antibiotics. 6. S/P repair of left femoral AV graft:  Patient with very limited IV access and need to preserve the left leg fistula.  Appreciate vascular assistance.   7. Anemia:  LIkely due to renal disease and recent bleeding from graft site.  No need for transfusion at this time.  Epo-stimulation per nephrology. 8. Hypertension:  Norvasc started today with hydralazine as needed   Code Status: Full Family Communication: N/A Disposition Plan: Likely home at time of  discharge  [name], [facility]  [facility] Pager [phone]. If 8PM-8AM, please contact night-coverage at [email or web address], password [name] 04/28/2012, 3:28 PM  LOS: 4 days   Brief narrative: 41 year-old female with known history of ESRD on hemodialysis on Monday Wednesday Friday was discharged home today morning after being treated for left AV graft bleeding presented back to the ER because of persistent nausea and vomiting. Patient states she has been having these symptoms for last 2 days with epigastric pain. Denies any chest pain or shortness of breath fever chills or any diarrhea. In the ER CT abdomen pelvis does not show any acute. Patient has been admitted for further management. Patient was found to be having leukocytosis and mild fever.   Consultants:  Nephrology  Procedures:  None  Antibiotics:  Ceftz  8/26 >>  Start date 8/28 (first neg BCx), last date 10/8  Vancomycin 8/26 >>  Last date 10/8   Rifampin 8/28 >> Last date 10/8  HPI/Subjective: Pt states that her nausea and vomiting are improved.  Now has diarrhea after starting rifampin.  Had some back pain which improved with sitting up in bed.  Denies chest pain, cough, shortness of breath.  No new skin lesions or wounds.  Decreased drainage from left leg graft.  Itching from tape on left neck.  Objective: Filed Vitals:   04/28/12 0626 04/28/12 0655 04/28/12 1022 04/28/12 1450  BP: 194/90 178/94 185/99 177/96  Pulse:   92 90  Temp:   98.6 F (37 C) 98.3 F (36.8 C)  TempSrc:  Oral Oral  Resp:   20 18  Height:      Weight:      SpO2:   97% 98%   Weight change: 1.26 kg (2 lb 12.4 oz)  Intake/Output Summary (Last 24 hours) at 04/28/12 1528 Last data filed at 04/28/12 1451  Gross per 24 hour  Intake   1338 ml  Output      1 ml  Net   1337 ml    General: Alert, awake, oriented x3, in no acute distress.  HEENT: [place]/AT PEERL, EOMI,  Neck: Trachea midline,  Supple.  Left neck IV with lots of tape OROPHARYNX:   MMM, No exudate/ erythema/lesions.  Heart: Regular rate and rhythm.  Lungs: Clear to auscultation. Abdomen: Soft, nontender, nondistended, positive bowel sounds, no masses no hepatosplenomegaly noted.  Neuro: No focal neurological deficits noted  Ext:  Left leg with bandages with small amount of clear yellow green discharge over weeping area (1cm ulcer).  No surrounding induration or erythema.  Bilateral AKA.    Data Reviewed: Basic Metabolic Panel:  Lab 04/27/12 4540 04/26/12 0855 04/25/12 0550 04/24/12 1900 04/24/12 1027 04/24/12 0732  NA 136 136 132* 133* 136 --  K 3.9 3.6 4.2 3.9 4.0 --  CL 95* 94* 91* 92* -- 93*  CO2 26 26 24 28  -- --  GLUCOSE 99 280* 318* 278* 415* --  BUN 27* 22 35* 27* -- 21  CREATININE 7.18* 5.81* 7.45* 6.89* -- 5.70*  CALCIUM 9.0 9.4 8.5 8.4 -- --  MG -- -- -- -- -- --  PHOS 5.8* -- -- -- -- --   Liver Function Tests:  Lab 04/27/12 0915 04/25/12 0550 04/24/12 1900  AST -- 20 20  ALT -- 6 9  ALKPHOS -- 82 73  BILITOT -- 0.2* 0.2*  PROT -- 6.7 6.6  ALBUMIN 2.7* 2.6* 2.6*    Lab 04/24/12 1900  LIPASE 21  AMYLASE --   No results found for this basename: AMMONIA:5 in the last 168 hours CBC:  Lab 04/27/12 0900 04/26/12 0855 04/25/12 0550 04/24/12 1900 04/24/12 1027 04/24/12 0707  WBC 13.5* 17.2* 20.6* 23.8* -- 33.5*  NEUTROABS 9.1* 13.7* 17.4* 20.5* -- 29.9*  HGB 7.1* 7.8* 8.2* 8.3* 9.5* --  HCT 20.6* 22.6* 22.7* 23.4* 28.0* --  MCV 82.7 81.6 81.4 81.5 -- 85.4  PLT 248 217 186 170 -- 211   Cardiac Enzymes: No results found for this basename: CKTOTAL:5,CKMB:5,CKMBINDEX:5,TROPONINI:5 in the last 168 hours BNP (last 3 results) No results found for this basename: PROBNP:3 in the last 8760 hours CBG:  Lab 04/28/12 1219 04/28/12 0739 04/27/12 2153 04/27/12 1644 04/27/12 0740  GLUCAP 80 248* 111* 159* 85    Recent Results (from the past 240 hour(s))  CULTURE, BLOOD (ROUTINE X 2)     Status: Normal   Collection Time   04/24/12 11:15 PM       Component Value Range Status Comment   Specimen Description BLOOD RIGHT FOREARM   Final    Special Requests BOTTLES DRAWN AEROBIC ONLY 10CC   Final    Culture  Setup Time 04/25/2012 08:46   Final    Culture     Final    Value: ACINETOBACTER CALCOACETICUS/BAUMANNII COMPLEX     Note: CRITICAL RESULT CALLED TO, READ BACK BY AND VERIFIED WITH: Jeris Penta AT 9811 04/25/12 BY SNOLO   Report Status 04/27/2012 FINAL   Final    Organism ID, Bacteria ACINETOBACTER CALCOACETICUS/BAUMANNII COMPLEX   Final   CULTURE, BLOOD (  ROUTINE X 2)     Status: Normal (Preliminary result)   Collection Time   04/24/12 11:30 PM      Component Value Range Status Comment   Specimen Description BLOOD RIGHT HAND   Final    Special Requests BOTTLES DRAWN AEROBIC AND ANAEROBIC 5CC EA   Final    Culture  Setup Time 04/25/2012 08:46   Final    Culture     Final    Value:        BLOOD CULTURE RECEIVED NO GROWTH TO DATE CULTURE WILL BE HELD FOR 5 DAYS BEFORE ISSUING A FINAL NEGATIVE REPORT   Report Status PENDING   Incomplete   MRSA PCR SCREENING     Status: Abnormal   Collection Time   04/25/12 12:03 AM      Component Value Range Status Comment   MRSA by PCR POSITIVE (*) NEGATIVE Final   WOUND CULTURE     Status: Normal (Preliminary result)   Collection Time   04/26/12 12:12 PM      Component Value Range Status Comment   Specimen Description WOUND LEFT THIGH   Final    Special Requests Immunocompromised   Final    Gram Stain     Final    Value: FEW WBC PRESENT,BOTH PMN AND MONONUCLEAR     NO SQUAMOUS EPITHELIAL CELLS SEEN     RARE GRAM POSITIVE COCCI     IN PAIRS   Culture MODERATE STAPHYLOCOCCUS SPECIES   Final    Report Status PENDING   Incomplete   CULTURE, BLOOD (SINGLE)     Status: Normal (Preliminary result)   Collection Time   04/27/12  3:45 PM      Component Value Range Status Comment   Specimen Description BLOOD ARM LEFT   Final    Special Requests BOTTLES DRAWN AEROBIC AND ANAEROBIC 10CC   Final     Culture  Setup Time 04/27/2012 22:20   Final    Culture     Final    Value:        BLOOD CULTURE RECEIVED NO GROWTH TO DATE CULTURE WILL BE HELD FOR 5 DAYS BEFORE ISSUING A FINAL NEGATIVE REPORT   Report Status PENDING   Incomplete      Studies: Ct Abdomen Pelvis Wo Contrast  04/24/2012  *RADIOLOGY REPORT*  Clinical Data: Abdominal pain, nausea and vomiting.  CT ABDOMEN AND PELVIS WITHOUT CONTRAST  Technique:  Multidetector CT imaging of the abdomen and pelvis was performed following the standard protocol without intravenous contrast.  Comparison: CT abdomen and pelvis 06/30/2009.  Findings: Linear basilar atelectasis is seen bilaterally.  No pleural or pericardial effusion.  There may be a few tiny stones within the gallbladder but no CT evidence of cholecystitis is identified.  The liver, spleen, adrenal glands and pancreas are unremarkable.  There is marked renal atrophy bilaterally with cystic change and calcifications consistent with chronic renal failure.  The patient has extensive atherosclerotic vascular disease.  Uterus, adnexa and urinary bladder are unremarkable.  The stomach, small and large bowel and appendix all appear normal.  No lymphadenopathy or fluid is seen. There is no focal bony abnormality.  IMPRESSION:  1.  No acute finding. 2.  Possible tiny gallstones.  No evidence of cholecystitis. 3.  Findings consistent with chronic renal failure.   Original Report Authenticated By: Bernadene Bell. Maricela Curet, M.D.    US Abdomen Complete  04/25/2012  *RADIOLOGY REPORT*  Clinical Data:  Vomiting, abdominal pain, question cholecystitis  ULTRASOUND  ABDOMEN:  Technique:  Sonography of upper abdominal structures was performed. Image quality degraded by patient body habitus  Comparison:  None Correlation:  CT abdomen 04/24/2012  Gallbladder:  Tiny intraluminal echogenic focus without definite posterior shadowing within gallbladder question tiny nonshadowing calculus versus polyp.  Gallbladder wall normal  thickness.  No pericholecystic fluid or sonographic Murphy's sign.  Common bile duct:  Normal caliber 3 mm diameter  Liver:  Normal appearance  IVC:  Normal appearance  Pancreas:  Head and tail incompletely visualized, partially obscured by bowel gas, with visualized portions normal appearance  Spleen:  Unremarkable  Right kidney:  8.3 cm length.  Cortical thinning.  Hypoechoic nodule at inferior pole 1.1 x 1.2 x 1.7 cm, by CT a small cyst. No solid mass or hydronephrosis.  Left kidney:  7.2 cm length.  Diffuse cortical thinning.  Increased cortical echogenicity.  Hypoechoic nodule at midportion 9 x 8 x 14 mm, by CT a small cyst.  No hydronephrosis or solid mass.  Aorta:  Bifurcation obscured by bowel gas.  Visualized portions normal caliber.  Other:  No free fluid  IMPRESSION: Atrophic kidneys consistent with end-stage renal disease. Small bilateral renal cysts. Question tiny gallstones versus gallbladder polyps without sonographic evidence of acute cholecystitis.   Original Report Authenticated By: Lollie Marrow, M.D.    Dg Chest Port 1 View  04/25/2012  *RADIOLOGY REPORT*  Clinical Data: Shortness of breath.  "Rule out infiltrates."  PORTABLE CHEST - 1 VIEW  Comparison: 01/14/2010  Findings: Midline trachea.  Mild cardiomegaly. No pleural effusion or pneumothorax.  Left greater than right bibasilar airspace disease. No congestive failure.  IMPRESSION: Left greater than right bibasilar airspace disease.  Especially on the left, infection or aspiration cannot be excluded.  Favor atelectasis on the right.  Cardiomegaly without congestive failure.   Original Report Authenticated By: Consuello Bossier, M.D.     Scheduled Meds:    . amLODipine  10 mg Oral Daily  . aspirin EC  81 mg Oral Daily  . calcium acetate  667 mg Oral TID WC  . cefTAZidime (FORTAZ)  IV  2 g Intravenous Q M,W,F-HD  . Chlorhexidine Gluconate Cloth  6 each Topical q morning - 10a  . darbepoetin (ARANESP) injection - DIALYSIS  100 mcg  Intravenous Q Mon-HD  . ferric gluconate (FERRLECIT/NULECIT) IV  125 mg Intravenous Q Wed-HD  . insulin aspart  0-20 Units Subcutaneous TID WC  . insulin aspart  0-5 Units Subcutaneous QHS  . insulin aspart protamine-insulin aspart  10 Units Subcutaneous Q breakfast  . insulin aspart protamine-insulin aspart  9 Units Subcutaneous Q supper  . multivitamin  1 tablet Oral QHS  . mupirocin ointment  1 application Nasal BID  . olopatadine  1 drop Both Eyes BID  . pantoprazole  40 mg Oral Q1200  . paricalcitol      . paricalcitol  6 mcg Intravenous Q M,W,F-HD  . rifampin  300 mg Oral Q12H  . sodium chloride  3 mL Intravenous Q12H  . triamcinolone cream  1 application Topical BID  . trimethoprim-polymyxin b  1 drop Left Eye Q4H  . vancomycin  1,000 mg Intravenous Q M,W,F-HD   Continuous Infusions:   Principal Problem:  *Nausea and vomiting Active Problems:  Leucocytosis  ESRD (end stage renal disease) on dialysis  Diabetes mellitus  Anemia  Bacteremia

## 2012-04-29 ENCOUNTER — Inpatient Hospital Stay (HOSPITAL_COMMUNITY): Payer: Medicare Other

## 2012-04-29 DIAGNOSIS — T827XXA Infection and inflammatory reaction due to other cardiac and vascular devices, implants and grafts, initial encounter: Principal | ICD-10-CM

## 2012-04-29 DIAGNOSIS — B9689 Other specified bacterial agents as the cause of diseases classified elsewhere: Secondary | ICD-10-CM

## 2012-04-29 LAB — GLUCOSE, CAPILLARY: Glucose-Capillary: 98 mg/dL (ref 70–99)

## 2012-04-29 LAB — CBC
HCT: 30.2 % — ABNORMAL LOW (ref 36.0–46.0)
Hemoglobin: 10.3 g/dL — ABNORMAL LOW (ref 12.0–15.0)
MCH: 28 pg (ref 26.0–34.0)
MCHC: 34.1 g/dL (ref 30.0–36.0)
MCV: 82.1 fL (ref 78.0–100.0)
Platelets: 267 10*3/uL (ref 150–400)
RBC: 3.68 MIL/uL — ABNORMAL LOW (ref 3.87–5.11)
RDW: 15 % (ref 11.5–15.5)
WBC: 13.5 10*3/uL — ABNORMAL HIGH (ref 4.0–10.5)

## 2012-04-29 LAB — RENAL FUNCTION PANEL
Albumin: 2.8 g/dL — ABNORMAL LOW (ref 3.5–5.2)
BUN: 20 mg/dL (ref 6–23)
CO2: 27 mEq/L (ref 19–32)
Calcium: 9.7 mg/dL (ref 8.4–10.5)
Chloride: 94 mEq/L — ABNORMAL LOW (ref 96–112)
Creatinine, Ser: 6.84 mg/dL — ABNORMAL HIGH (ref 0.50–1.10)
GFR calc Af Amer: 8 mL/min — ABNORMAL LOW (ref >90)
GFR calc non Af Amer: 7 mL/min — ABNORMAL LOW (ref >90)
Glucose, Bld: 100 mg/dL — ABNORMAL HIGH (ref 70–99)
Phosphorus: 3.9 mg/dL (ref 2.3–4.6)
Potassium: 3.6 mEq/L (ref 3.5–5.1)
Sodium: 135 mEq/L (ref 135–145)

## 2012-04-29 LAB — VANCOMYCIN, TROUGH: Vancomycin Tr: 30.2 ug/mL (ref 10.0–20.0)

## 2012-04-29 LAB — WOUND CULTURE

## 2012-04-29 MED ORDER — RIFAMPIN 300 MG PO CAPS: 300.0000 mg | ORAL_CAPSULE | Freq: Two times a day (BID) | ORAL | Status: AC

## 2012-04-29 MED ORDER — RENA-VITE PO TABS: 1.0000 | ORAL_TABLET | Freq: Every day | ORAL | Status: DC

## 2012-04-29 MED ORDER — VANCOMYCIN HCL IN DEXTROSE 1-5 GM/200ML-% IV SOLN: 1000.0000 mg | INTRAVENOUS | Status: DC

## 2012-04-29 MED ORDER — INSULIN ASPART PROT & ASPART (70-30 MIX) 100 UNIT/ML ~~LOC~~ SUSP: SUBCUTANEOUS | Status: DC

## 2012-04-29 MED ORDER — OXYCODONE HCL 5 MG PO TABS: 5.0000 mg | ORAL_TABLET | Freq: Four times a day (QID) | ORAL | Status: AC | PRN

## 2012-04-29 MED ORDER — DEXTROSE 5 % IV SOLN: 2.0000 g | Freq: Once | INTRAVENOUS | Status: DC

## 2012-04-29 MED ORDER — AMLODIPINE BESYLATE 10 MG PO TABS: 10.0000 mg | ORAL_TABLET | Freq: Every day | ORAL | Status: DC

## 2012-04-29 MED ORDER — PARICALCITOL 5 MCG/ML IV SOLN
INTRAVENOUS | Status: AC
  Administered 2012-04-29: 6 ug via INTRAVENOUS
  Filled 2012-04-29: qty 2

## 2012-04-29 NOTE — Progress Notes (Addendum)
[facility] Progress Note  Subjective: Slept so - so.  Still salivating a lot and spitting out clear saliva.  Denies nausea.  Has H/A  Objective Filed Vitals:   04/29/12 0700 04/29/12 0703 04/29/12 0730 04/29/12 0800  BP: 205/102 178/101 153/88 139/80  Pulse: 88 88 88 82  Temp: 98.5 F (36.9 C)     TempSrc: Oral     Resp: 17 18 17 18   Height:      Weight: 83.9 kg (184 lb 15.5 oz)     SpO2: 100% 99% 99% 100%   Physical Exam General: Sitting up on HD. NAD spitting clear saliva into emesis basin Heart: RRR Lungs: soft exp wheezes right base otherwise clear Abdomen: obese soft Extremities: bilateral high AKA; left medial graft dressing in tact - not removed; no significant edema Dialysis Access: left thigh AVGG needles on lateral side QB 400 VP 290 - green light  Hemodialysis OP Orders= [facility] , 4 hrs. ,edw 84.5 kg Left Femoral AVGG, BFR 400. DFR 800, EPO= 2,800 q hd , Infed 50 mg q weekly hd Zemplar 6.24mcg q hd Heparin 7.0 ml q hd  Home BP- ? norvasc 10/d, not on hospital home med list  Home binders, vit D- ? phoslo 1ac   Assessment/Plan: 1. Acinetobaceter bacteremia (fortaz sensitive- day 6) 1/2 +; staph + cultures graft wound (day 6 Vanc - sens pending); hx MRSA PCR;  rifampin added; ID rec 6 weeks fortaz (per Dr. Joan Mayans note); s/p replacement of venous limb of thigh graft secondary to erosion 8/25 Afebrile the past 24 hours.  Will give IV Vanc and Fortaz at her outpt HD center after discharge. Vanc level 30 today.  Will need pharmacy guidelines for outpt vanc dose and then will monitor trough levels as needed. 2. ESRD - MWF on HD - labs pending; to use arterial side only of graft for 3 weeks per VVS due ot revision; using 4 K bath 3. Anemia - Hgb 7.1 8/28 S/p 2 units PRBC earlier during admission;Hgb up to 10.3 today. 4. Secondary hyperparathyroidism - continue current meds; P controlled 5. HTN/volume - not on BP meds,  norvasc 10 resumed 8/29.; decrease volume on HD  Goal 3.8 today Pre HD wt 83.8- will have a new lower EDW at discharge - to be determined post HD today. 6. Nutrition - renal diet  7. DM - per primary 8. Disp -hopefully d/c today.  Sheffield Slider, PA-C [facility] Beeper [phone]  04/29/2012,8:33 AM  LOS: 5 days    Additional Objective Labs: Basic Metabolic Panel:  Lab 04/29/12 6213 04/27/12 0915 04/26/12 0855  NA 135 136 136  K 3.6 3.9 3.6  CL 94* 95* 94*  CO2 27 26 26   GLUCOSE 100* 99 280*  BUN 20 27* 22  CREATININE 6.84* 7.18* 5.81*  CALCIUM 9.7 9.0 9.4  ALB -- -- --  PHOS 3.9 5.8* --   Liver Function Tests:  Lab 04/29/12 0718 04/27/12 0915 04/25/12 0550 04/24/12 1900  AST -- -- 20 20  ALT -- -- 6 9  ALKPHOS -- -- 82 73  BILITOT -- -- 0.2* 0.2*  PROT -- -- 6.7 6.6  ALBUMIN 2.8* 2.7* 2.6* --    Lab 04/24/12 1900  LIPASE 21  AMYLASE --   CBC:  Lab 04/29/12 0718 04/27/12 0900 04/26/12 0855 04/25/12 0550 04/24/12 1900  WBC 13.5* 13.5* 17.2* -- --  NEUTROABS -- 9.1* 13.7* 17.4* --  HGB 10.3* 7.1* 7.8* -- --  HCT  30.2* 20.6* 22.6* -- --  MCV 82.1 82.7 81.6 81.4 81.5  PLT 267 248 217 -- --   Blood Culture    Component Value Date/Time   SDES BLOOD ARM LEFT 04/28/2012 1042   SPECREQUEST BOTTLES DRAWN AEROBIC ONLY 8.0CC 04/28/2012 1042   CULT        BLOOD CULTURE RECEIVED NO GROWTH TO DATE CULTURE WILL BE HELD FOR 5 DAYS BEFORE ISSUING A FINAL NEGATIVE REPORT 04/28/2012 1042   REPTSTATUS PENDING 04/28/2012 1042  CBG:  Lab 04/28/12 2022 04/28/12 1629 04/28/12 1219 04/28/12 0739 04/27/12 2153  GLUCAP 89 83 80 248* 111*  Medications:      . amLODipine  10 mg Oral Daily  . aspirin EC  81 mg Oral Daily  . calcium acetate  667 mg Oral TID WC  . cefTAZidime (FORTAZ)  IV  2 g Intravenous Q M,W,F-HD  . Chlorhexidine Gluconate Cloth  6 each Topical q morning - 10a  . darbepoetin (ARANESP) injection - DIALYSIS  100 mcg Intravenous Q Mon-HD  . ferric gluconate (FERRLECIT/NULECIT) IV  125 mg  Intravenous Q Wed-HD  . insulin aspart  0-20 Units Subcutaneous TID WC  . insulin aspart  0-5 Units Subcutaneous QHS  . insulin aspart protamine-insulin aspart  10 Units Subcutaneous Q breakfast  . insulin aspart protamine-insulin aspart  9 Units Subcutaneous Q supper  . multivitamin  1 tablet Oral QHS  . mupirocin ointment  1 application Nasal BID  . olopatadine  1 drop Both Eyes BID  . pantoprazole  40 mg Oral Q1200  . paricalcitol  6 mcg Intravenous Q M,W,F-HD  . rifampin  300 mg Oral Q12H  . sodium chloride  3 mL Intravenous Q12H  . triamcinolone cream  1 application Topical BID  . trimethoprim-polymyxin b  1 drop Left Eye Q4H  . vancomycin  1,000 mg Intravenous Q M,W,F-HD

## 2012-04-29 NOTE — Discharge Summary (Addendum)
Physician Discharge Summary  [name] XBJ:[id number] DOB: 1970/12/07 DOA: 04/24/2012  PCP: Lieutenant Diego, MD  Admit date: 04/24/2012 Discharge date: 04/29/2012  Recommendations for Outpatient Follow-up:  1. Please follow up with your primary dialysis center for dialysis and administration of two antibiotics, vancomycin and ceftazidime.   2. Please take rifampin twice a day until gone, first dose should be taken this evening 8/30.   3. Please follow up with your primary care doctor within 1 weeks or sooner if you develop diarrhea.  Please have your primary care doctor follow up on the final speciation and sensitivities of the Staph from your left graft site. 4. Please follow up with Dr. Daiva Eves, the infectious disease doctor, in 6 weeks (about the time you stop your antibiotics).  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  Diet recommendation: Health heart, 2gm sodium, renal diet  Wt Readings from Last 3 Encounters:  04/29/12 83.9 kg (184 lb 15.5 oz)  01/22/12 90.719 kg (200 lb)  01/22/12 90.719 kg (200 lb)    History of present illness:   41 year-old female with known history of ESRD on hemodialysis on Monday Wednesday Friday was discharged home today morning after being treated for left AV graft bleeding presented back to the ER because of persistent nausea and vomiting. Patient states she has been having these symptoms for last 2 days with epigastric pain. Denies any chest pain or shortness of breath fever chills or any diarrhea. In the ER CT abdomen pelvis does not show any acute. Patient has been admitted for further management. Patient was found to be having leukocytosis and mild fever.  Hospital Course:   1. Bacteremia: One of 2 blood  cultures from 8/25 grew Acinetobacter which is relatively sensitive.  The source of the acinetobacter was never identified and it may be possible that it is seeded in the graft site.  Patient was transitioned to ceftazadine. Atlhough she was initially febrile, her last fever was 100.8F 8/28 and she was recultured.  Her first negative blood culture is from 8/28 and she has blood culture from 8/29 which is still pending.  She should follow up with her PCP to review the final results of the cultures within 1 week.  Per Infectious Disease doctor, Dr. Daiva Eves, should continue ceftazidime for 6 weeks from date of first negative blood culture, so 06/07/12. 2. 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 Arbie Cookey replaced the venous limb of the AV Gore-Tex graft left femoral loop.   She has very limited IV access and needs to preserve her left leg fistula at almost any cost (including possibility of life-long suppressive antibiotics) per vascular surgery. 3. On 8/27, she developed discharge from left leg graft site which Dr. Arbie Cookey cultured.  It grew Staph species.  She has MRSA PCR positivity from the nares.  She was started on vancomycin, which should also continue for 6 weeks and end approximately 10/8.  Dr. Daiva Eves also recommended the addition of rifampin for synergy and better penetration of the graft material to stop on 10/8.  He would like to see her back in clinic in 6 weeks from time of discharge.   4. The patient had 2-3 episodes of diarrhea after starting rifampin which  resolved.  Discussed with Dr. Daiva Eves who recommended holding off on C diff assay unless patient is having 4-5 watery bowel movements as a positive test now may represent only colonization given not clinically acting like acute C. Diff infection.  Repeat CBC showed white count continuing to trend down.  Advised patient to call her PCP or nephrologist or come to the ER if she develops 4-5  watery BMs daily.  5. DM II:  The patient developed hypoglycemia to the 30s on 8/27 while on her home insulin regimen.  Her insulin was reduced and at time of discharge she her fingersticks are in the 80s (relatively low) on 10 units 70/30 in the AM and 9 at night.  Advised her to take 8 units qAM and 6 units qPM and not take her meal-time insulin and to tall her PCP if her fingersticks get about 250 or less than 80.  She may resume her metformin. She should follow up with her pcp in 1 week to review fingersticks as well as blood cultures.    6. ESRD: HD MWF. Nephrology aware to arrange for outpatient antibiotics. 7. Anemia: LIkely due to renal disease and recent bleeding from graft site. No need for transfusion at this time. Epo-stimulation per nephrology. 8. Hypertension: Norvasc started during admission and she may need additional medications at future date.  Procedures: Replacement of venous limb of the AV Gore-Tex graft left femoral loop 8/25   Consultations:  Vascular surgery  Infectious disease  Nephrology  Discharge Exam: Filed Vitals:   04/29/12 1117  BP: 135/74  Pulse: 92  Temp: 97.8 F (36.6 C)  Resp: 18   Filed Vitals:   04/29/12 1030 04/29/12 1100 04/29/12 1114 04/29/12 1117  BP: 133/82 130/72 127/77 135/74  Pulse: 95 98 91 92  Temp:    97.8 F (36.6 C)  TempSrc:    Oral  Resp: 16 18 17 18   Height:      Weight:      SpO2:    98%    General: AAF, sitting up in dialysis bed, no acute distress HEENT:  Crusting around both eyes, PERRL, anicteric, noninjected Neck:  Supple with left neck IV protruding with lots of paper tape Cardiovascular: RRR, no murmurs, rubs, gallops Lungs: Clear to auscultation.  Abdomen: Soft, nontender, nondistended, positive bowel sounds, no masses no hepatosplenomegaly noted.  Neuro: No focal neurological deficits noted  Ext: Left leg with bandages over the previously noted 1cm ulcer 2/2 active HD at graft site.    Discharge  Instructions  Discharge Orders    Future Orders Please Complete By Expires   Diet - low sodium heart healthy      Increase activity slowly      Call MD for:  temperature >100.4      Call MD for:  persistant nausea and vomiting      Call MD for:  severe uncontrolled pain      Call MD for:  redness, tenderness, or signs of infection (pain, swelling, redness, odor or green/yellow discharge around incision site)      Call MD for:  difficulty breathing, headache or visual disturbances      Call MD for:  hives      Call MD for:  persistant dizziness or light-headedness      Call MD for:  extreme fatigue      Call MD for:      Comments:   Call your primary care doctor if you have 4-5 watery  or diarrheal bowel movements per day.  Call 911 if you have chest pain with shortness of breath or symptoms of stroke, including confusion, slurred speech, facial droop, numbness or weakness of an arm or leg.   Discharge instructions      Comments:   You were hospitalized because of bleeding from your graft which was corrected on 8/25.  You also had two infections, one in your blood stream (Acinetobacter) and one in your left femoral graft (Staph aureus).  You should complete 6 weeks of antibiotics for these infections, the last day of which is October 8th.  Two of the antibiotics will be given during hemodialysis (vancomycin and ceftazadime) and the third antibiotic (rifampin) you will need to take by mouth twice a day.  Please do not miss any doses.  These antibiotics put you at risk of C. Diff infection.  If you develop 4-5 loose watery bowel movements per day, please call your primary care doctor, notify your nephrologist, or return to the emergency department.    Finally, you were given insulin at a much lower dose here in the hospital.  Please reduce your insulin 70/30 down to 8 units in the morning and 6 units at night.  Stop your mealtime insulin.  If you start having fingersticks higher than 250 or any less  than 80, call your primary care doctor immediately.     Medication List  As of 04/29/2012 11:55 AM   STOP taking these medications         insulin aspart 100 UNIT/ML injection         TAKE these medications         amLODipine 10 MG tablet   Commonly known as: NORVASC   Take 1 tablet (10 mg total) by mouth daily.      aspirin EC 81 MG tablet   Take 81 mg by mouth daily.      calcium acetate 667 MG capsule   Commonly known as: PHOSLO   Take by mouth 3 (three) times daily with meals.      dextrose 5 % SOLN 50 mL with cefTAZidime 2 G SOLR 2 g   Inject 2 g into the vein once.      hydrOXYzine 25 MG tablet   Commonly known as: ATARAX/VISTARIL   Take 25 mg by mouth 3 (three) times daily as needed. For dizziness      ibuprofen 200 MG tablet   Commonly known as: ADVIL,MOTRIN   Take 200 mg by mouth every 6 (six) hours as needed. pain      insulin aspart protamine-insulin aspart (70-30) 100 UNIT/ML injection   Commonly known as: NOVOLOG 70/30   Inject 8 units in the morning and 6 units at night.      metFORMIN 500 MG tablet   Commonly known as: GLUCOPHAGE   Take 500 mg by mouth 2 (two) times daily with a meal.      metoCLOPramide 10 MG tablet   Commonly known as: REGLAN   Take 5 mg by mouth 3 (three) times daily as needed.      multivitamin Tabs tablet   Take 1 tablet by mouth at bedtime.      naphazoline-glycerin 0.012-0.2 % Soln   Commonly known as: CLEAR EYES   Place 1-2 drops into both eyes every 4 (four) hours as needed. Irritated eyes      olopatadine 0.1 % ophthalmic solution   Commonly known as: PATANOL   Place 1 drop into both eyes  2 (two) times daily.      omeprazole 20 MG capsule   Commonly known as: PRILOSEC   Take 20 mg by mouth 2 (two) times daily.      oxyCODONE 5 MG immediate release tablet   Commonly known as: Oxy IR/ROXICODONE   Place 1 tablet (5 mg total) into feeding tube every 6 (six) hours as needed.      rifampin 300 MG capsule   Commonly  known as: RIFADIN   Take 1 capsule (300 mg total) by mouth every 12 (twelve) hours.      traMADol 50 MG tablet   Commonly known as: ULTRAM   Take 50 mg by mouth every 6 (six) hours as needed. For pain      triamcinolone cream 0.1 %   Commonly known as: KENALOG   Apply 1 application topically 2 (two) times daily. Apply to rashy areas 2 or 3 times daily      trimethoprim-polymyxin b ophthalmic solution   Commonly known as: POLYTRIM   Place 1 drop into the left eye every 4 (four) hours.      vancomycin 1 GM/200ML Soln   Commonly known as: VANCOCIN   Inject 200 mLs (1,000 mg total) into the vein every Monday, Wednesday, and Friday with hemodialysis.           Follow-up Information    Follow up with [facility], MD. Schedule an appointment as soon as possible for a visit in 2 weeks.   Contact information:   [address] [place] [address]       Follow up with Acey Lav, MD. Schedule an appointment as soon as possible for a visit in 6 weeks. (to follow up regarding your graft and blood stream infections)    Contact information:   [address] [place] [address] [phone]           The results of significant diagnostics from this hospitalization (including imaging, microbiology, ancillary and laboratory) are listed below for reference.    Significant Diagnostic Studies: Ct Abdomen Pelvis Wo Contrast  04/24/2012  *RADIOLOGY REPORT*  Clinical Data: Abdominal pain, nausea and vomiting.  CT ABDOMEN AND PELVIS WITHOUT CONTRAST  Technique:  Multidetector CT imaging of the abdomen and pelvis was performed following the standard protocol without intravenous contrast.  Comparison: CT abdomen and pelvis 06/30/2009.  Findings: Linear basilar atelectasis is seen bilaterally.  No pleural or pericardial effusion.  There may be a few tiny stones within the gallbladder but no CT evidence of cholecystitis is  identified.  The liver, spleen, adrenal glands and pancreas are unremarkable.  There is marked renal atrophy bilaterally with cystic change and calcifications consistent with chronic renal failure.  The patient has extensive atherosclerotic vascular disease.  Uterus, adnexa and urinary bladder are unremarkable.  The stomach, small and large bowel and appendix all appear normal.  No lymphadenopathy or fluid is seen. There is no focal bony abnormality.  IMPRESSION:  1.  No acute finding. 2.  Possible tiny gallstones.  No evidence of cholecystitis. 3.  Findings consistent with chronic renal failure.   Original Report Authenticated By: Bernadene Bell. Maricela Curet, M.D.    US Abdomen Complete  04/25/2012  *RADIOLOGY REPORT*  Clinical Data:  Vomiting, abdominal pain, question cholecystitis  ULTRASOUND ABDOMEN:  Technique:  Sonography of upper abdominal structures was performed. Image quality degraded by patient body habitus  Comparison:  None Correlation:  CT abdomen 04/24/2012  Gallbladder:  Tiny intraluminal echogenic focus without definite posterior shadowing within gallbladder question tiny nonshadowing calculus versus polyp.  Gallbladder wall normal thickness.  No pericholecystic fluid or sonographic Murphy's sign.  Common bile duct:  Normal caliber 3 mm diameter  Liver:  Normal appearance  IVC:  Normal appearance  Pancreas:  Head and tail incompletely visualized, partially obscured by bowel gas, with visualized portions normal appearance  Spleen:  Unremarkable  Right kidney:  8.3 cm length.  Cortical thinning.  Hypoechoic nodule at inferior pole 1.1 x 1.2 x 1.7 cm, by CT a small cyst. No solid mass or hydronephrosis.  Left kidney:  7.2 cm length.  Diffuse cortical thinning.  Increased cortical echogenicity.  Hypoechoic nodule at midportion 9 x 8 x 14 mm, by CT a small cyst.  No hydronephrosis or solid mass.  Aorta:  Bifurcation obscured by bowel gas.  Visualized portions normal caliber.  Other:  No free fluid   IMPRESSION: Atrophic kidneys consistent with end-stage renal disease. Small bilateral renal cysts. Question tiny gallstones versus gallbladder polyps without sonographic evidence of acute cholecystitis.   Original Report Authenticated By: Lollie Marrow, M.D.    Dg Chest Port 1 View  04/25/2012  *RADIOLOGY REPORT*  Clinical Data: Shortness of breath.  "Rule out infiltrates."  PORTABLE CHEST - 1 VIEW  Comparison: 01/14/2010  Findings: Midline trachea.  Mild cardiomegaly. No pleural effusion or pneumothorax.  Left greater than right bibasilar airspace disease. No congestive failure.  IMPRESSION: Left greater than right bibasilar airspace disease.  Especially on the left, infection or aspiration cannot be excluded.  Favor atelectasis on the right.  Cardiomegaly without congestive failure.   Original Report Authenticated By: Consuello Bossier, M.D.     Microbiology: Recent Results (from the past 240 hour(s))  CULTURE, BLOOD (ROUTINE X 2)     Status: Normal   Collection Time   04/24/12 11:15 PM      Component Value Range Status Comment   Specimen Description BLOOD RIGHT FOREARM   Final    Special Requests BOTTLES DRAWN AEROBIC ONLY 10CC   Final    Culture  Setup Time 04/25/2012 08:46   Final    Culture     Final    Value: ACINETOBACTER CALCOACETICUS/BAUMANNII COMPLEX     Note: CRITICAL RESULT CALLED TO, READ BACK BY AND VERIFIED WITH: VALENCIA M AT 1478 04/25/12 BY SNOLO   Report Status 04/27/2012 FINAL   Final    Organism ID, Bacteria ACINETOBACTER CALCOACETICUS/BAUMANNII COMPLEX   Final   CULTURE, BLOOD (ROUTINE X 2)     Status: Normal (Preliminary result)   Collection Time   04/24/12 11:30 PM      Component Value Range Status Comment   Specimen Description BLOOD RIGHT HAND   Final    Special Requests BOTTLES DRAWN AEROBIC AND ANAEROBIC 5CC EA   Final    Culture  Setup Time 04/25/2012 08:46   Final    Culture     Final    Value:        BLOOD CULTURE RECEIVED NO GROWTH TO DATE CULTURE WILL BE HELD  FOR 5 DAYS BEFORE ISSUING A FINAL NEGATIVE REPORT   Report Status PENDING   Incomplete   MRSA PCR SCREENING     Status: Abnormal   Collection Time   04/25/12 12:03 AM      Component Value Range Status Comment   MRSA by PCR POSITIVE (*) NEGATIVE Final   WOUND CULTURE     Status: Normal (Preliminary result)  Collection Time   04/26/12 12:12 PM      Component Value Range Status Comment   Specimen Description WOUND LEFT THIGH   Final    Special Requests Immunocompromised   Final    Gram Stain     Final    Value: FEW WBC PRESENT,BOTH PMN AND MONONUCLEAR     NO SQUAMOUS EPITHELIAL CELLS SEEN     RARE GRAM POSITIVE COCCI     IN PAIRS   Culture MODERATE STAPHYLOCOCCUS SPECIES   Final    Report Status PENDING   Incomplete   CULTURE, BLOOD (SINGLE)     Status: Normal (Preliminary result)   Collection Time   04/27/12  3:45 PM      Component Value Range Status Comment   Specimen Description BLOOD ARM LEFT   Final    Special Requests BOTTLES DRAWN AEROBIC AND ANAEROBIC 10CC   Final    Culture  Setup Time 04/27/2012 22:20   Final    Culture     Final    Value:        BLOOD CULTURE RECEIVED NO GROWTH TO DATE CULTURE WILL BE HELD FOR 5 DAYS BEFORE ISSUING A FINAL NEGATIVE REPORT   Report Status PENDING   Incomplete   CULTURE, BLOOD (SINGLE)     Status: Normal (Preliminary result)   Collection Time   04/28/12 10:42 AM      Component Value Range Status Comment   Specimen Description BLOOD ARM LEFT   Final    Special Requests BOTTLES DRAWN AEROBIC ONLY 8.0CC   Final    Culture  Setup Time 04/28/2012 20:43   Final    Culture     Final    Value:        BLOOD CULTURE RECEIVED NO GROWTH TO DATE CULTURE WILL BE HELD FOR 5 DAYS BEFORE ISSUING A FINAL NEGATIVE REPORT   Report Status PENDING   Incomplete      Labs: Basic Metabolic Panel:  Lab 04/29/12 1610 04/27/12 0915 04/26/12 0855 04/25/12 0550 04/24/12 1900  NA 135 136 136 132* 133*  K 3.6 3.9 3.6 4.2 3.9  CL 94* 95* 94* 91* 92*  CO2 27 26 26  24 28   GLUCOSE 100* 99 280* 318* 278*  BUN 20 27* 22 35* 27*  CREATININE 6.84* 7.18* 5.81* 7.45* 6.89*  CALCIUM 9.7 9.0 9.4 8.5 8.4  MG -- -- -- -- --  PHOS 3.9 5.8* -- -- --   Liver Function Tests:  Lab 04/29/12 0718 04/27/12 0915 04/25/12 0550 04/24/12 1900  AST -- -- 20 20  ALT -- -- 6 9  ALKPHOS -- -- 82 73  BILITOT -- -- 0.2* 0.2*  PROT -- -- 6.7 6.6  ALBUMIN 2.8* 2.7* 2.6* 2.6*    Lab 04/24/12 1900  LIPASE 21  AMYLASE --   No results found for this basename: AMMONIA:5 in the last 168 hours CBC:  Lab 04/29/12 0718 04/27/12 0900 04/26/12 0855 04/25/12 0550 04/24/12 1900 04/24/12 0707  WBC 13.5* 13.5* 17.2* 20.6* 23.8* --  NEUTROABS -- 9.1* 13.7* 17.4* 20.5* 29.9*  HGB 10.3* 7.1* 7.8* 8.2* 8.3* --  HCT 30.2* 20.6* 22.6* 22.7* 23.4* --  MCV 82.1 82.7 81.6 81.4 81.5 --  PLT 267 248 217 186 170 --   Cardiac Enzymes: No results found for this basename: CKTOTAL:5,CKMB:5,CKMBINDEX:5,TROPONINI:5 in the last 168 hours BNP: BNP (last 3 results) No results found for this basename: PROBNP:3 in the last 8760 hours CBG:  Lab 04/28/12 2022 04/28/12  1629 04/28/12 1219 04/28/12 0739 04/27/12 2153  GLUCAP 89 83 80 248* 111*    Time coordinating discharge: 45 minutes  Signed:  Anberlyn Feimster  [facility] 04/29/2012, 11:55 AM

## 2012-04-29 NOTE — Progress Notes (Signed)
AVS reviewed with pt. Teach back method used. Pt able to verbalize understanding of AVS with no questions. IV and tele DC'd. Pt escorted via personal wheelchair to exit of facility. [name], [name] Randy

## 2012-04-29 NOTE — Progress Notes (Signed)
ANTIBIOTIC CONSULT NOTE - FOLLOW UP  Pharmacy Consult for Vancomycin + Ceftazidime Indication: Acinetobacter Bacteremia and empiric wound coverage  Allergies  Allergen Reactions  . Percocet (Oxycodone-Acetaminophen) Other (See Comments)    GI UPSET  . Soap Itching    IVORY SOAP    Patient Measurements: Height: 5\' 3"  (160 cm) (bilateral aka) Weight: 184 lb 15.5 oz (83.9 kg) IBW/kg (Calculated) : 52.4   Vital Signs: Temp: 98.5 F (36.9 C) (08/30 0700) Temp src: Oral (08/30 0700) BP: 134/73 mmHg (08/30 0900) Pulse Rate: 93  (08/30 0900) Intake/Output from previous day: 08/29 0701 - 08/30 0700 In: 600 [P.O.:600] Out: -  Intake/Output from this shift:    Labs:  Basename 04/29/12 0718 04/27/12 0915 04/27/12 0900  WBC 13.5* -- 13.5*  HGB 10.3* -- 7.1*  PLT 267 -- 248  LABCREA -- -- --  CREATININE 6.84* 7.18* --   Estimated Creatinine Clearance: 11 ml/min (by C-G formula based on Cr of 6.84).  Basename 04/29/12 0720  VANCOTROUGH 30.2*  VANCOPEAK --  Drue Dun --  GENTTROUGH --  GENTPEAK --  GENTRANDOM --  TOBRATROUGH --  TOBRAPEAK --  TOBRARND --  AMIKACINPEAK --  AMIKACINTROU --  AMIKACIN --     Microbiology: Recent Results (from the past 720 hour(s))  CULTURE, BLOOD (ROUTINE X 2)     Status: Normal   Collection Time   04/24/12 11:15 PM      Component Value Range Status Comment   Specimen Description BLOOD RIGHT FOREARM   Final    Special Requests BOTTLES DRAWN AEROBIC ONLY 10CC   Final    Culture  Setup Time 04/25/2012 08:46   Final    Culture     Final    Value: ACINETOBACTER CALCOACETICUS/BAUMANNII COMPLEX     Note: CRITICAL RESULT CALLED TO, READ BACK BY AND VERIFIED WITH: VALENCIA M AT 1610 04/25/12 BY SNOLO   Report Status 04/27/2012 FINAL   Final    Organism ID, Bacteria ACINETOBACTER CALCOACETICUS/BAUMANNII COMPLEX   Final   CULTURE, BLOOD (ROUTINE X 2)     Status: Normal (Preliminary result)   Collection Time   04/24/12 11:30 PM   Component Value Range Status Comment   Specimen Description BLOOD RIGHT HAND   Final    Special Requests BOTTLES DRAWN AEROBIC AND ANAEROBIC 5CC EA   Final    Culture  Setup Time 04/25/2012 08:46   Final    Culture     Final    Value:        BLOOD CULTURE RECEIVED NO GROWTH TO DATE CULTURE WILL BE HELD FOR 5 DAYS BEFORE ISSUING A FINAL NEGATIVE REPORT   Report Status PENDING   Incomplete   MRSA PCR SCREENING     Status: Abnormal   Collection Time   04/25/12 12:03 AM      Component Value Range Status Comment   MRSA by PCR POSITIVE (*) NEGATIVE Final   WOUND CULTURE     Status: Normal (Preliminary result)   Collection Time   04/26/12 12:12 PM      Component Value Range Status Comment   Specimen Description WOUND LEFT THIGH   Final    Special Requests Immunocompromised   Final    Gram Stain     Final    Value: FEW WBC PRESENT,BOTH PMN AND MONONUCLEAR     NO SQUAMOUS EPITHELIAL CELLS SEEN     RARE GRAM POSITIVE COCCI     IN PAIRS   Culture MODERATE STAPHYLOCOCCUS SPECIES  Final    Report Status PENDING   Incomplete   CULTURE, BLOOD (SINGLE)     Status: Normal (Preliminary result)   Collection Time   04/27/12  3:45 PM      Component Value Range Status Comment   Specimen Description BLOOD ARM LEFT   Final    Special Requests BOTTLES DRAWN AEROBIC AND ANAEROBIC 10CC   Final    Culture  Setup Time 04/27/2012 22:20   Final    Culture     Final    Value:        BLOOD CULTURE RECEIVED NO GROWTH TO DATE CULTURE WILL BE HELD FOR 5 DAYS BEFORE ISSUING A FINAL NEGATIVE REPORT   Report Status PENDING   Incomplete   CULTURE, BLOOD (SINGLE)     Status: Normal (Preliminary result)   Collection Time   04/28/12 10:42 AM      Component Value Range Status Comment   Specimen Description BLOOD ARM LEFT   Final    Special Requests BOTTLES DRAWN AEROBIC ONLY 8.0CC   Final    Culture  Setup Time 04/28/2012 20:43   Final    Culture     Final    Value:        BLOOD CULTURE RECEIVED NO GROWTH TO DATE  CULTURE WILL BE HELD FOR 5 DAYS BEFORE ISSUING A FINAL NEGATIVE REPORT   Report Status PENDING   Incomplete     Anti-infectives     Start     Dose/Rate Route Frequency Ordered Stop   05/02/12 1200   vancomycin (VANCOCIN) IVPB 1000 mg/200 mL premix        1,000 mg 200 mL/hr over 60 Minutes Intravenous Every M-W-F (Hemodialysis) 04/29/12 0928     04/27/12 2200   rifampin (RIFADIN) capsule 300 mg        300 mg Oral Every 12 hours 04/27/12 1655     04/25/12 1200   vancomycin (VANCOCIN) IVPB 1000 mg/200 mL premix  Status:  Discontinued        1,000 mg 200 mL/hr over 60 Minutes Intravenous Every M-W-F (Hemodialysis) 04/25/12 0022 04/29/12 0928   04/25/12 1200   cefTAZidime (FORTAZ) 2 g in dextrose 5 % 50 mL IVPB        2 g 100 mL/hr over 30 Minutes Intravenous Every M-W-F (Hemodialysis) 04/25/12 0022     04/25/12 0130   vancomycin (VANCOCIN) 2,000 mg in sodium chloride 0.9 % 500 mL IVPB        2,000 mg 250 mL/hr over 120 Minutes Intravenous  Once 04/25/12 0021 04/25/12 0328   04/25/12 0100   cefTAZidime (FORTAZ) 2 g in dextrose 5 % 50 mL IVPB        2 g 100 mL/hr over 30 Minutes Intravenous  Once 04/25/12 0021 04/25/12 0114          Assessment: 41 y.o. F with ESRD admitted on 8/25 and started on Vancomycin + Ceftazidime D#3 for r/o sepsis. The patient is now found to have Acinetobacter growing in 1/2 blood cultures -- sensitivities show that it is sensitive to Ceftaz. The patient is also on Vancomycin for empiric wound coverage -- the preliminary cultures still pending show moderate staphylococcus species.   The patient was loaded appropriately with Vancomycin and Ceftaz on 8/26 and then received extra doses prior to HD that evening with none charted afterwards. A pre-HD Vancomycin level this morning was slightly elevated above goal range (Vanc level 30.3, goal of 15-25 mcg/ml). Will plan to hold  Vancomycin dose today and resume post HD session on Monday, September 2nd.   Goal of  Therapy:  Pre-HD Vancomycin level of 15-25 mcg/ml  Plan:  1. Hold Vancomycin dose post hemodialysis today 2. Continue Vancomycin 1g IV post HD sessions on M/W/F (starting on 05/02/12) 3. Continue Ceftazidime 2g IV post HD sessions on M/W/F 4. Will continue to follow HD schedule/duration, culture results, LOT, and antibiotic de-escalation plans   Georgina Pillion, PharmD, BCPS Clinical Pharmacist Pager: [phone] 04/29/2012 9:39 AM

## 2012-05-01 LAB — CULTURE, BLOOD (ROUTINE X 2): Culture: NO GROWTH

## 2012-05-02 ENCOUNTER — Encounter (HOSPITAL_COMMUNITY): Payer: Self-pay | Admitting: Nurse Practitioner

## 2012-05-02 ENCOUNTER — Emergency Department (HOSPITAL_COMMUNITY)
Admission: EM | Admit: 2012-05-02 | Discharge: 2012-05-02 | Disposition: A | Discharge status: Home / Self Care | Payer: Medicare Other | Attending: Emergency Medicine | Admitting: Emergency Medicine

## 2012-05-02 ENCOUNTER — Other Ambulatory Visit: Payer: Self-pay | Admitting: Nephrology

## 2012-05-02 DIAGNOSIS — I120 Hypertensive chronic kidney disease with stage 5 chronic kidney disease or end stage renal disease: Secondary | ICD-10-CM | POA: Insufficient documentation

## 2012-05-02 DIAGNOSIS — Z5189 Encounter for other specified aftercare: Secondary | ICD-10-CM

## 2012-05-02 DIAGNOSIS — Z8614 Personal history of Methicillin resistant Staphylococcus aureus infection: Secondary | ICD-10-CM | POA: Insufficient documentation

## 2012-05-02 DIAGNOSIS — Y838 Other surgical procedures as the cause of abnormal reaction of the patient, or of later complication, without mention of misadventure at the time of the procedure: Secondary | ICD-10-CM | POA: Insufficient documentation

## 2012-05-02 DIAGNOSIS — E119 Type 2 diabetes mellitus without complications: Secondary | ICD-10-CM | POA: Insufficient documentation

## 2012-05-02 DIAGNOSIS — K219 Gastro-esophageal reflux disease without esophagitis: Secondary | ICD-10-CM | POA: Insufficient documentation

## 2012-05-02 DIAGNOSIS — T82898A Other specified complication of vascular prosthetic devices, implants and grafts, initial encounter: Secondary | ICD-10-CM | POA: Insufficient documentation

## 2012-05-02 DIAGNOSIS — Z79899 Other long term (current) drug therapy: Secondary | ICD-10-CM | POA: Insufficient documentation

## 2012-05-02 DIAGNOSIS — N186 End stage renal disease: Secondary | ICD-10-CM | POA: Insufficient documentation

## 2012-05-02 LAB — POTASSIUM: Potassium: 3.6 mEq/L (ref 3.5–5.1)

## 2012-05-02 LAB — CULTURE, BLOOD (ROUTINE X 2)

## 2012-05-02 NOTE — ED Provider Notes (Signed)
History     CSN: [id number]  Arrival date & time 05/02/12  1478   First MD Initiated Contact with Patient 05/02/12 1008      Chief Complaint  Patient presents with  . graft bleeding     (Consider location/radiation/quality/duration/timing/severity/associated sxs/prior treatment) HPI  41 y.o. female with ESRD on hemodialysis sent by from dialysis center this morning for  Bleeding from site of injection this a.m. that was uncontrolled. Hemostasis was achieved with pressure the patient was still sent to the ED for further evaluation. She was not dialyze at all this a.m. bleeding is controlled at this point she denies any nausea vomiting, shortness of breath, chest pain, headache.    Past Medical History  Diagnosis Date  . GERD (gastroesophageal reflux disease)   . Diabetes mellitus     IDDM  . Hypertension     states has been on med. x "a long time"  . ESRF (end stage renal failure)     dialysis M,W, F; left thigh AV graft  . History of gangrene     left foot  . Hx MRSA infection   . 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 Av fistula placement 10/18/2008    right thigh AV graft  . Finger debridement 06/20/2010    right middle finger  . Finger amputation 05/27/2010    right middle  . Thrombectomy / arteriovenous graft revision 01/14/2010    left superficial femoral artery; left thigh AV graft placement  . 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 Leg amputation below knee 10/11/2009    right  . Central venous catheter insertion 08/06/2009    removal right IJ Diatek cath., placement left IJ Diatek cath.  . 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 Foot amputation 04/04/2009    left transtibial amputation  . Excision / curettage bone cyst talus / calcaneus 03/04/2009    partial calcaneal exc. left; placement wound VAC  . Groin debridement 11/16/2008    right rectus femoris muscle flap to right groin wound; right  groing debridement  . Femoral endarterectomy 11/06/2008    right common femoral; embolectomy right femoral artery; removal right femoral AV graft  . Central venous catheter insertion 07/10/2008; 12/31/2006; 01/19/2006; 06/19/2005; 09/19/2004; 09/08/2004    right IJ Diatek catheter  . Av fistula repair 07/08/2008    removal left upper arm AV graft  . Thrombectomy / arteriovenous graft revision 05/11/2007;12/31/2006; 01/18/2006; 12/16/2005    left upper arm  . Dialysis fistula creation 01/20/2007    left upper arm AV graft  . Dialysis fistula creation 01/28/2006    right upper arm AV graft  . Thrombectomy / arteriovenous graft revision 10/26/2005    left forearm AV graft  . Dialysis fistula creation 10/04/2005    left forearm AV graft  . Av fistula repair 07/14/2005    removal infected right forearm AV graft  . Thrombectomy / arteriovenous graft revision 03/11/2005; 10/20/2004    right forearm AV graft  . Dialysis fistula creation 09/10/2004    right forearm AV graft  . Av fistula placement 07/18/2004    creation left AV fistula, radial to cephalic  . Finger exploration 07/13/2002    and repair left middle finger  . Carpal tunnel release 01/26/2012    Procedure: CARPAL TUNNEL RELEASE;  Surgeon: Nicki Reaper, MD;  Location: [facility];  Service: Orthopedics;  Laterality:  Left;    Family History  Problem Relation Age of Onset  . Diabetes Mother   . Heart disease Mother   . Hypertension Mother   . Heart attack Mother 98  . Diabetes Sister   . Hypertension Brother   . Heart attack Brother     History  Substance Use Topics  . Smoking status: Never Smoker   . Smokeless tobacco: Never Used  . Alcohol Use: No    OB History    Grav Para Term Preterm Abortions TAB SAB Ect Mult Living                  Review of Systems  Constitutional: Negative for fever.  Respiratory: Negative for shortness of breath.   Cardiovascular: Negative for chest pain.  Gastrointestinal: Negative for  nausea, vomiting, abdominal pain and diarrhea.  Skin: Positive for wound.  All other systems reviewed and are negative.    Allergies  Percocet and Soap  Home Medications   Current Outpatient Rx  Name Route Sig Dispense Refill  . AMLODIPINE BESYLATE 10 MG PO TABS Oral Take 1 tablet (10 mg total) by mouth daily. 90 tablet 3  . ASPIRIN EC 81 MG PO TBEC Oral Take 81 mg by mouth daily.    Marland Kitchen CALCIUM ACETATE 667 MG PO CAPS Oral Take by mouth 3 (three) times daily with meals.     . CEFTAZIDIME 2 G/50 ML IVPB MIXTURE Intravenous Inject 2 g into the vein once. 100 mL 0    This is one of your antibiotics to get at dialysis ...  . HYDROXYZINE HCL 25 MG PO TABS Oral Take 25 mg by mouth 3 (three) times daily as needed. For dizziness    . IBUPROFEN 200 MG PO TABS Oral Take 200 mg by mouth every 6 (six) hours as needed. pain    . INSULIN ASPART PROT & ASPART (70-30) 100 UNIT/ML [place] SUSP  Inject 8 units in the morning and 6 units at night. 10 mL 1  . METFORMIN HCL 500 MG PO TABS Oral Take 500 mg by mouth 2 (two) times daily with a meal.    . METOCLOPRAMIDE HCL 10 MG PO TABS Oral Take 5 mg by mouth 3 (three) times daily as needed.     Marland Kitchen RENA-VITE PO TABS Oral Take 1 tablet by mouth at bedtime. 90 tablet 3  . NAPHAZOLINE-GLYCERIN 0.012-0.2 % OP SOLN Both Eyes Place 1-2 drops into both eyes every 4 (four) hours as needed. Irritated eyes    . OLOPATADINE HCL 0.1 % OP SOLN Both Eyes Place 1 drop into both eyes 2 (two) times daily.    Marland Kitchen OMEPRAZOLE 20 MG PO CPDR Oral Take 20 mg by mouth 2 (two) times daily.    . OXYCODONE HCL 5 MG PO TABS Per Tube Place 1 tablet (5 mg total) into feeding tube every 6 (six) hours as needed. 10 tablet 0  . RIFAMPIN 300 MG PO CAPS Oral Take 1 capsule (300 mg total) by mouth every 12 (twelve) hours. 79 capsule 0  . TRAMADOL HCL 50 MG PO TABS Oral Take 50 mg by mouth every 6 (six) hours as needed. For pain    . TRIAMCINOLONE ACETONIDE 0.1 % EX CREA Topical Apply 1 application  topically 2 (two) times daily. Apply to rashy areas 2 or 3 times daily    . POLYMYXIN B-TRIMETHOPRIM 10000-0.1 UNIT/ML-% OP SOLN Left Eye Place 1 drop into the left eye every 4 (four) hours.    Marland Kitchen  VANCOMYCIN HCL IN DEXTROSE 1 GM/200ML IV SOLN Intravenous Inject 200 mLs (1,000 mg total) into the vein every Monday, Wednesday, and Friday with hemodialysis. 4000 mL 0    This is the second antibiotic you will receive at  ...    BP 130/84  Pulse 94  Temp 98.4 F (36.9 C) (Oral)  Resp 18  SpO2 92%  Physical Exam  Nursing note and vitals reviewed. Constitutional: She is oriented to person, place, and time. She appears well-developed and well-nourished. No distress.       Obese  HENT:  Head: Normocephalic.       Sceral icterus proptosis  Eyes: Conjunctivae and EOM are normal.  Cardiovascular: Normal rate, regular rhythm and normal heart sounds.   Pulmonary/Chest: Effort normal and breath sounds normal.  Abdominal: Soft. Bowel sounds are normal.  Musculoskeletal: Normal range of motion.       Bilateral above-the-knee amputation  Neurological: She is alert and oriented to person, place, and time.  Skin:       No bleeding from AV graft site or from injection site  Psychiatric: She has a normal mood and affect.    ED Course  Procedures (including critical care time)  Labs Reviewed - No data to display No results found.   1. Visit for wound check       MDM  Patient sent by dialysis center with controlled bleeding to access site. Consult from nephrology PA appreciated she has recommended that if potassium normal, patient can be discharged for dialysis tomorrow.   Pt verbalized understanding and agrees with care plan. Outpatient follow-up and return precautions given.           Joni Reining Avrum Kimball, PA-C 05/02/12 1210

## 2012-05-02 NOTE — ED Notes (Signed)
Per EMS pt. Had to needles inserted at dialysis center and one of the needles slipped out and begin the bleed. With pinpoint pressure bleeding was able to be controlled. Vitals: BP 169/119 100%/RA R: 18 P:88

## 2012-05-02 NOTE — ED Notes (Signed)
Called Dialysis center, spoke with CN. Center wanting to make sure site is "re-circulating". PA Bard Herbert was paged. Reporting will come down and look at site. Bruit present at site. No bleeding at this time.

## 2012-05-02 NOTE — ED Notes (Signed)
Pt inquired of Potassium lab results.  Shared the results showing in EPIC with pt.  Pt stated she was informed she could discharge after the results appeared because she will be dialyzed in the morning.

## 2012-05-02 NOTE — ED Notes (Signed)
Pt. Was at hemodialysis this AM for insertion of AV graft in the left femoral. Pt. States there were 2 needles in her leg and one dislodged causing bleeding. Bleeding was controlled at dialysis center with gauze and pressure. Upon arrival there were pin point holes on left lower extremity, no bleeding present. Pt had saturated gauze at previous graft site, no present bleeding but yellow drainage noted at site.

## 2012-05-02 NOTE — ED Provider Notes (Signed)
Medical screening examination/treatment/procedure(s) were performed by non-physician practitioner and as supervising physician I was immediately available for consultation/collaboration.   Rolan Bucco, MD 05/02/12 1444

## 2012-05-02 NOTE — ED Notes (Signed)
Kidney PA at bedside to see patient

## 2012-05-02 NOTE — ED Notes (Signed)
Pt verbalized understanding of discharge, will go to dialysis tomorrow morning. Call ride to come get here.

## 2012-05-02 NOTE — Progress Notes (Signed)
Asked to see patient with ESRD who was sent from dialysis after problems with cannulation and then bleeding from a dislodged needle that had not been fully secured.  Her dialysis staff had been concerned that she had been recirculating.  Her cannulation is limited to the lateral (arterial) side of her graft due to recent venous side replacement (8/25 Dr. Arbie Cookey).  Her left thigh graft had been cannulated successful in the hospital prior to recent discharge.  On exam she has some bilateral aka edema.  Left thigh AVGG is patent.  Bleeding has stopped.  I have discussed with Dr. Fredia Sorrow who recommends re cannulation tomorrow and if problems, then, order a shuntagram.  A K level is being checked due to missed HD today.  She is rescheduled for HD Tuesday at 7 am at the [facility].  Bard Herbert, PA-C 1140 am

## 2012-05-03 LAB — CULTURE, BLOOD (SINGLE): Culture: NO GROWTH

## 2012-05-04 LAB — CULTURE, BLOOD (SINGLE): Culture: NO GROWTH

## 2012-06-07 ENCOUNTER — Ambulatory Visit (INDEPENDENT_AMBULATORY_CARE_PROVIDER_SITE_OTHER): Payer: Medicare Other | Admitting: Infectious Disease

## 2012-06-07 ENCOUNTER — Encounter: Payer: Self-pay | Admitting: Infectious Disease

## 2012-06-07 VITALS — BP 158/95 | HR 101 | Temp 98.0°F

## 2012-06-07 DIAGNOSIS — B9689 Other specified bacterial agents as the cause of diseases classified elsewhere: Secondary | ICD-10-CM

## 2012-06-07 DIAGNOSIS — T827XXA Infection and inflammatory reaction due to other cardiac and vascular devices, implants and grafts, initial encounter: Secondary | ICD-10-CM

## 2012-06-07 DIAGNOSIS — A498 Other bacterial infections of unspecified site: Secondary | ICD-10-CM

## 2012-06-07 DIAGNOSIS — A4902 Methicillin resistant Staphylococcus aureus infection, unspecified site: Secondary | ICD-10-CM

## 2012-06-07 NOTE — Assessment & Plan Note (Signed)
Hopefully the graft itself was not truly infected as there will VERY HIGH RISK FOR RECURRENCE WITHOUT REMOVAL OR AT MINIMUM CHRONIC SUPPRESSIVE ABX. I WILL DC HER ABX AND BRING HER BACK IN 2 MONTHS TIME. IF SHE HAS RECURRENCE AND GRAFT CANNOT BE REMOVED, WILL NEED PERMANENT SUPPRESSIVE ABX

## 2012-06-07 NOTE — Assessment & Plan Note (Signed)
See above discussion. Organism isolated from drainage from graft.

## 2012-06-07 NOTE — Progress Notes (Signed)
  Subjective:    Patient ID: [name], female    DOB: 1971-07-10, 41 y.o.   MRN: [id number]  HPI  41 y.o. female PMH of IDDM , ESRD HD MWF schedule, h/o multiple access in both arms and both legs for HD who was [facility] after bleed from her graft that required revision by [facility]. She then grew 1 out of 2 blood culture from 04/24/12 with ACINETOBACTER CALCOACETICUS S ceftaz, MIC =8. She had some drainage from her graft which was cultured and grew MRSA. She was set up with 6 weeks of IV vancomycin, oral rifampin and IV ceftazidime which she is completing. Her graft is healing well. She c.o not being able to bathe the area and requests app with [facility] to get clearance to do so.   Review of Systems  Constitutional: Negative for fever, chills, diaphoresis, activity change, appetite change, fatigue and unexpected weight change.  HENT: Negative for congestion, sore throat, rhinorrhea, sneezing, trouble swallowing and sinus pressure.   Eyes: Negative for photophobia and visual disturbance.  Respiratory: Negative for cough, chest tightness, shortness of breath, wheezing and stridor.   Cardiovascular: Negative for chest pain, palpitations and leg swelling.  Gastrointestinal: Negative for nausea, vomiting, abdominal pain, diarrhea, constipation, blood in stool, abdominal distention and anal bleeding.  Genitourinary: Negative for dysuria, hematuria, flank pain and difficulty urinating.  Musculoskeletal: Negative for myalgias, back pain, joint swelling, arthralgias and gait problem.  Skin: Positive for wound. Negative for color change, pallor and rash.  Neurological: Negative for dizziness, tremors, weakness and light-headedness.  Hematological: Negative for adenopathy. Does not bruise/bleed easily.  Psychiatric/Behavioral: Negative for behavioral problems, confusion, disturbed wake/sleep cycle, dysphoric mood, decreased concentration and agitation.       Objective:   Physical Exam    Constitutional: She is oriented to person, place, and time. She appears well-developed and well-nourished. No distress.  HENT:  Head: Normocephalic and atraumatic.  Mouth/Throat: Oropharynx is clear and moist. No oropharyngeal exudate.  Eyes: Conjunctivae normal and EOM are normal. No scleral icterus.  Neck: Normal range of motion. Neck supple. No JVD present.  Cardiovascular: Normal rate, regular rhythm and normal heart sounds.  Exam reveals no gallop and no friction rub.   No murmur heard. Pulmonary/Chest: Effort normal and breath sounds normal. No respiratory distress. She has no wheezes. She has no rales. She exhibits no tenderness.  Abdominal: She exhibits no distension and no mass. There is no tenderness. There is no rebound and no guarding.  Musculoskeletal: She exhibits no edema and no tenderness.       Legs: Lymphadenopathy:    She has no cervical adenopathy.  Neurological: She is alert and oriented to person, place, and time.  Skin: Skin is warm and dry. She is not diaphoretic. There is erythema.  Psychiatric: She has a normal mood and affect. Her behavior is normal. Judgment and thought content normal.          Assessment & Plan:  Arteriovenous graft infection Hopefully the graft itself was not truly infected as there will VERY HIGH RISK FOR RECURRENCE WITHOUT REMOVAL OR AT MINIMUM CHRONIC SUPPRESSIVE ABX. I WILL DC HER ABX AND BRING HER BACK IN 2 MONTHS TIME. 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 NOt clear acenitobacter was a pathogen  MRSA infection See above discussion. Organism isolated from drainage from graft.

## 2012-06-07 NOTE — Assessment & Plan Note (Signed)
SEE ABOVE discussion. NOt clear acenitobacter was a pathogen

## 2012-06-10 ENCOUNTER — Emergency Department (HOSPITAL_COMMUNITY)
Admission: EM | Admit: 2012-06-10 | Discharge: 2012-06-10 | Disposition: A | Discharge status: Home / Self Care | Payer: Medicare Other | Attending: Emergency Medicine | Admitting: Emergency Medicine

## 2012-06-10 ENCOUNTER — Encounter (HOSPITAL_COMMUNITY): Payer: Self-pay | Admitting: Emergency Medicine

## 2012-06-10 DIAGNOSIS — Z79899 Other long term (current) drug therapy: Secondary | ICD-10-CM | POA: Insufficient documentation

## 2012-06-10 DIAGNOSIS — E1169 Type 2 diabetes mellitus with other specified complication: Secondary | ICD-10-CM | POA: Insufficient documentation

## 2012-06-10 DIAGNOSIS — Z8614 Personal history of Methicillin resistant Staphylococcus aureus infection: Secondary | ICD-10-CM | POA: Insufficient documentation

## 2012-06-10 DIAGNOSIS — N186 End stage renal disease: Secondary | ICD-10-CM | POA: Insufficient documentation

## 2012-06-10 DIAGNOSIS — K219 Gastro-esophageal reflux disease without esophagitis: Secondary | ICD-10-CM | POA: Insufficient documentation

## 2012-06-10 DIAGNOSIS — Z794 Long term (current) use of insulin: Secondary | ICD-10-CM | POA: Insufficient documentation

## 2012-06-10 DIAGNOSIS — I120 Hypertensive chronic kidney disease with stage 5 chronic kidney disease or end stage renal disease: Secondary | ICD-10-CM | POA: Insufficient documentation

## 2012-06-10 DIAGNOSIS — E162 Hypoglycemia, unspecified: Secondary | ICD-10-CM

## 2012-06-10 LAB — GLUCOSE, CAPILLARY
Glucose-Capillary: 212 mg/dL — ABNORMAL HIGH (ref 70–99)
Glucose-Capillary: 231 mg/dL — ABNORMAL HIGH (ref 70–99)

## 2012-06-10 NOTE — ED Notes (Signed)
IV attempt x 1 unsuccessful. ?

## 2012-06-10 NOTE — Discharge Instructions (Signed)

## 2012-06-10 NOTE — ED Notes (Signed)
Pt tolerating PO intake, crackers, PB, and OJ.  Waiting on meal tray at the present.  No distress noted.

## 2012-06-10 NOTE — ED Notes (Signed)
Diet tray ordered 

## 2012-06-10 NOTE — ED Notes (Signed)
Pt very sleepy. States "I just didn't get any sleep last night". Dr. Oletta Lamas back in the room to reassess and inform pt she would be going back to finish her dialysis treatment.

## 2012-06-10 NOTE — ED Notes (Signed)
Per report from [facility] pt was receiving her dialysis today when the staff noticed that she was becoming more sleepy.  They checked a CBG and found it to be 96 which was down from her 146 they got at the beginning of treatment.  They administered oral glucose but she was still drowsy.  They then called EMS for transport.  Pt states that this am she had a cbg of 340 at home and administered 25 units of 70/30 insulin.  She is alert and oriented on arrival though does feel tired.  No distress noted.

## 2012-06-10 NOTE — ED Provider Notes (Signed)
 History     CSN: [id number]  Arrival date & time 06/10/12  7829   First MD Initiated Contact with Patient 06/10/12 [phone]      Chief Complaint  Patient presents with  . Hypoglycemia    (Consider location/radiation/quality/duration/timing/severity/associated sxs/prior treatment) HPI Comments: [name] reports that this morning she woke up and took her blood sugar and it was 325. Her medication list shows that she takes insulin 70/30 10 units in the morning and 8 units in the evening. She reports that she decided to take 25 units this morning due to the high blood sugar. She has not eaten her breakfast. She went on to dialysis, and there her blood sugar was about 150 and then soon afterwards dropped to the 90s. It seems that in anticipation of a hypoglycemic episode, 911 was called and the patient is transported here. The patient did not complete her dialysis session. She has no other complaints. She is arousable and currently is speaking on the phone to a family member. She denies shortness of breath, nausea or vomiting or diarrhea. She normally is dialyzed on the [facility] dialysis [facility].  The history is provided by the patient and the EMS personnel.    Past Medical History  Diagnosis Date  . GERD (gastroesophageal reflux disease)   . Diabetes mellitus     IDDM  . Hypertension     states has been on med. x "a long time"  . ESRF (end stage renal failure)     dialysis M,W, F; left thigh AV graft  . History of gangrene     left foot  . Hx MRSA infection   . 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 Av fistula placement 10/18/2008    right thigh AV graft  . Finger debridement 06/20/2010    right middle finger  . Finger amputation 05/27/2010    right middle  . Thrombectomy / arteriovenous graft revision 01/14/2010    left superficial femoral artery; left thigh AV graft placement  . 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 Leg amputation below knee 10/11/2009    right  . Central venous catheter insertion 08/06/2009    removal right IJ Diatek cath., placement left IJ Diatek cath.  . 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 Foot amputation 04/04/2009    left transtibial amputation  . Excision / curettage bone cyst talus / calcaneus 03/04/2009    partial calcaneal exc. left; placement wound VAC  . Groin debridement 11/16/2008    right rectus femoris muscle flap to right groin wound; right groing debridement  . Femoral endarterectomy 11/06/2008    right common femoral; embolectomy right femoral artery; removal right femoral AV graft  . Central venous catheter insertion 07/10/2008; 12/31/2006; 01/19/2006; 06/19/2005; 09/19/2004; 09/08/2004    right IJ Diatek catheter  . Av fistula repair 07/08/2008    removal left upper arm AV graft  . Thrombectomy / arteriovenous graft revision 05/11/2007;12/31/2006; 01/18/2006; 12/16/2005    left upper arm  . Dialysis fistula creation 01/20/2007    left upper arm AV graft  . Dialysis fistula creation 01/28/2006    right upper arm AV graft  . Thrombectomy / arteriovenous graft revision 10/26/2005    left forearm AV graft  . Dialysis fistula creation 10/04/2005    left forearm AV graft  . Av fistula repair 07/14/2005    removal infected right  forearm AV graft  . Thrombectomy / arteriovenous graft revision 03/11/2005; 10/20/2004    right forearm AV graft  . Dialysis fistula creation 09/10/2004    right forearm AV graft  . Av fistula placement 07/18/2004    creation left AV fistula, radial to cephalic  . Finger exploration 07/13/2002    and repair left middle finger  . Carpal tunnel release 01/26/2012    Procedure: CARPAL TUNNEL RELEASE;  Surgeon: Nicki Reaper, MD;  Location: [facility];  Service: Orthopedics;  Laterality: Left;    Family History  Problem Relation Age of Onset  . Diabetes Mother   . Heart disease Mother   . Hypertension Mother   . Heart attack Mother  40  . Diabetes Sister   . Hypertension Brother   . Heart attack Brother     History  Substance Use Topics  . Smoking status: Never Smoker   . Smokeless tobacco: Never Used  . Alcohol Use: No    OB History    Grav Para Term Preterm Abortions TAB SAB Ect Mult Living                  Review of Systems  Constitutional: Negative for fever.  Respiratory: Negative for shortness of breath.   Cardiovascular: Negative for chest pain.  Gastrointestinal: Negative for nausea and vomiting.  Neurological: Negative for syncope and weakness.  Psychiatric/Behavioral: Negative for confusion and agitation.  All other systems reviewed and are negative.    Allergies  Percocet and Soap  Home Medications   Current Outpatient Rx  Name Route Sig Dispense Refill  . AMLODIPINE BESYLATE 10 MG PO TABS Oral Take 1 tablet (10 mg total) by mouth daily. 90 tablet 3  . ASPIRIN EC 81 MG PO TBEC Oral Take 81 mg by mouth daily.    Marland Kitchen CALCIUM ACETATE 667 MG PO CAPS Oral Take by mouth 3 (three) times daily with meals.     . CEFTAZIDIME 2 G/50 ML IVPB MIXTURE Intravenous Inject 2 g into the vein once. 100 mL 0    This is one of your antibiotics to get at dialysis ...  . HYDROXYZINE HCL 25 MG PO TABS Oral Take 25 mg by mouth 3 (three) times daily as needed. For dizziness    . IBUPROFEN 200 MG PO TABS Oral Take 200 mg by mouth every 6 (six) hours as needed. pain    . INSULIN ASPART PROT & ASPART (70-30) 100 UNIT/ML [place] SUSP  Inject 8 units in the morning and 6 units at night. 10 mL 1  . METFORMIN HCL 500 MG PO TABS Oral Take 500 mg by mouth 2 (two) times daily with a meal.    . METOCLOPRAMIDE HCL 10 MG PO TABS Oral Take 5 mg by mouth 3 (three) times daily as needed.     Marland Kitchen RENA-VITE PO TABS Oral Take 1 tablet by mouth at bedtime. 90 tablet 3  . NAPHAZOLINE-GLYCERIN 0.012-0.2 % OP SOLN Both Eyes Place 1-2 drops into both eyes every 4 (four) hours as needed. Irritated eyes    . OLOPATADINE HCL 0.1 % OP SOLN Both  Eyes Place 1 drop into both eyes 2 (two) times daily.    Marland Kitchen OMEPRAZOLE 20 MG PO CPDR Oral Take 20 mg by mouth 2 (two) times daily.    . TRAMADOL HCL 50 MG PO TABS Oral Take 50 mg by mouth every 6 (six) hours as needed. For pain    . TRIAMCINOLONE ACETONIDE 0.1 %  EX CREA Topical Apply 1 application topically 2 (two) times daily. Apply to rashy areas 2 or 3 times daily    . POLYMYXIN B-TRIMETHOPRIM 10000-0.1 UNIT/ML-% OP SOLN Left Eye Place 1 drop into the left eye every 4 (four) hours.    Marland Kitchen VANCOMYCIN HCL IN DEXTROSE 1 GM/200ML IV SOLN Intravenous Inject 200 mLs (1,000 mg total) into the vein every Monday, Wednesday, and Friday with hemodialysis. 4000 mL 0    This is the second antibiotic you will receive at  ...    BP 112/60  Pulse 100  Temp 98.2 F (36.8 C) (Oral)  Resp 16  SpO2 99%  Physical Exam  Nursing note and vitals reviewed. Constitutional: She is oriented to person, place, and time. She appears well-developed and well-nourished.  HENT:  Head: Normocephalic and atraumatic.  Neck: Normal range of motion. Neck supple.  Cardiovascular: Normal rate and regular rhythm.   Pulmonary/Chest: Effort normal. No respiratory distress.  Abdominal: Soft. She exhibits no distension.  Musculoskeletal:       catheter in place at left leg  Neurological: She is alert and oriented to person, place, and time. No cranial nerve deficit. Coordination normal.  Skin: Skin is warm and dry. No rash noted.    ED Course  Procedures (including critical care time)  Labs Reviewed - No data to display No results found.   1. Hypoglycemia     RA sat of 92% is adequate by my interpretation.  11:33 AM I discussed the patient with Dr. Arlean Hopping who has arranged that she can complete her dialysis session back at her usual station upon discharge here. The patient otherwise can followup with her primary care physician and she is educated about taking her correct dose of insulin.  MDM  Patient is currently  awake, alert, oriented. My plan is to allow the patient to tolerate by mouth's and to maintain her blood sugar. If this is a case, I will consult with nephrology to resume her dialysis session. Patient's hypoglycemic episode is most likely secondary to taking a larger dose in her usual and also not eating breakfast.        Gavin Pound. Kurtis Anastasia, MD 06/10/12 1133

## 2012-06-10 NOTE — ED Notes (Signed)
PJ, case management contacted [facility] for return to [facility] to finish her treatment.

## 2012-06-17 ENCOUNTER — Emergency Department (HOSPITAL_COMMUNITY): Payer: Medicare Other

## 2012-06-17 ENCOUNTER — Encounter (HOSPITAL_COMMUNITY): Payer: Self-pay

## 2012-06-17 ENCOUNTER — Inpatient Hospital Stay (HOSPITAL_COMMUNITY)
Admission: EM | Admit: 2012-06-17 | Discharge: 2012-06-19 | DRG: 811 | Disposition: A | Discharge status: Home / Self Care | Payer: Medicare Other | Attending: Internal Medicine | Admitting: Internal Medicine

## 2012-06-17 ENCOUNTER — Inpatient Hospital Stay (HOSPITAL_COMMUNITY): Payer: Medicare Other

## 2012-06-17 DIAGNOSIS — E669 Obesity, unspecified: Secondary | ICD-10-CM | POA: Diagnosis present

## 2012-06-17 DIAGNOSIS — A4902 Methicillin resistant Staphylococcus aureus infection, unspecified site: Secondary | ICD-10-CM

## 2012-06-17 DIAGNOSIS — D72829 Elevated white blood cell count, unspecified: Secondary | ICD-10-CM

## 2012-06-17 DIAGNOSIS — D631 Anemia in chronic kidney disease: Principal | ICD-10-CM | POA: Diagnosis present

## 2012-06-17 DIAGNOSIS — D649 Anemia, unspecified: Secondary | ICD-10-CM | POA: Diagnosis present

## 2012-06-17 DIAGNOSIS — T827XXA Infection and inflammatory reaction due to other cardiac and vascular devices, implants and grafts, initial encounter: Secondary | ICD-10-CM | POA: Diagnosis present

## 2012-06-17 DIAGNOSIS — Z6832 Body mass index (BMI) 32.0-32.9, adult: Secondary | ICD-10-CM

## 2012-06-17 DIAGNOSIS — K219 Gastro-esophageal reflux disease without esophagitis: Secondary | ICD-10-CM | POA: Diagnosis present

## 2012-06-17 DIAGNOSIS — IMO0001 Reserved for inherently not codable concepts without codable children: Secondary | ICD-10-CM | POA: Diagnosis present

## 2012-06-17 DIAGNOSIS — R7881 Bacteremia: Secondary | ICD-10-CM

## 2012-06-17 DIAGNOSIS — J40 Bronchitis, not specified as acute or chronic: Secondary | ICD-10-CM | POA: Diagnosis present

## 2012-06-17 DIAGNOSIS — S88119A Complete traumatic amputation at level between knee and ankle, unspecified lower leg, initial encounter: Secondary | ICD-10-CM

## 2012-06-17 DIAGNOSIS — I120 Hypertensive chronic kidney disease with stage 5 chronic kidney disease or end stage renal disease: Secondary | ICD-10-CM | POA: Diagnosis present

## 2012-06-17 DIAGNOSIS — N186 End stage renal disease: Secondary | ICD-10-CM | POA: Diagnosis present

## 2012-06-17 DIAGNOSIS — Z8614 Personal history of Methicillin resistant Staphylococcus aureus infection: Secondary | ICD-10-CM

## 2012-06-17 DIAGNOSIS — Z8249 Family history of ischemic heart disease and other diseases of the circulatory system: Secondary | ICD-10-CM

## 2012-06-17 DIAGNOSIS — Z833 Family history of diabetes mellitus: Secondary | ICD-10-CM

## 2012-06-17 DIAGNOSIS — N039 Chronic nephritic syndrome with unspecified morphologic changes: Principal | ICD-10-CM | POA: Diagnosis present

## 2012-06-17 DIAGNOSIS — A498 Other bacterial infections of unspecified site: Secondary | ICD-10-CM | POA: Diagnosis present

## 2012-06-17 DIAGNOSIS — E119 Type 2 diabetes mellitus without complications: Secondary | ICD-10-CM

## 2012-06-17 DIAGNOSIS — Z794 Long term (current) use of insulin: Secondary | ICD-10-CM

## 2012-06-17 DIAGNOSIS — Z992 Dependence on renal dialysis: Secondary | ICD-10-CM

## 2012-06-17 LAB — BASIC METABOLIC PANEL
BUN: 20 mg/dL (ref 6–23)
CO2: 30 mEq/L (ref 19–32)
Calcium: 9.1 mg/dL (ref 8.4–10.5)
Chloride: 96 mEq/L (ref 96–112)
Creatinine, Ser: 3.4 mg/dL — ABNORMAL HIGH (ref 0.50–1.10)
GFR calc Af Amer: 18 mL/min — ABNORMAL LOW (ref >90)
GFR calc non Af Amer: 16 mL/min — ABNORMAL LOW (ref >90)
Glucose, Bld: 129 mg/dL — ABNORMAL HIGH (ref 70–99)
Potassium: 3.3 mEq/L — ABNORMAL LOW (ref 3.5–5.1)
Sodium: 140 mEq/L (ref 135–145)

## 2012-06-17 LAB — IRON AND TIBC
Iron: 48 ug/dL (ref 42–135)
Saturation Ratios: 22 % (ref 20–55)
TIBC: 218 ug/dL — ABNORMAL LOW (ref 250–470)

## 2012-06-17 LAB — PREPARE RBC (CROSSMATCH)

## 2012-06-17 LAB — CBC
HCT: 15.5 % — ABNORMAL LOW (ref 36.0–46.0)
Hemoglobin: 5.2 g/dL — CL (ref 12.0–15.0)
MCH: 32.3 pg (ref 26.0–34.0)
MCHC: 33.5 g/dL (ref 30.0–36.0)
MCV: 96.3 fL (ref 78.0–100.0)
Platelets: 369 10*3/uL (ref 150–400)
RBC: 1.61 MIL/uL — ABNORMAL LOW (ref 3.87–5.11)
RDW: 18.1 % — ABNORMAL HIGH (ref 11.5–15.5)
WBC: 12.6 10*3/uL — ABNORMAL HIGH (ref 4.0–10.5)

## 2012-06-17 LAB — GLUCOSE, CAPILLARY: Glucose-Capillary: 308 mg/dL — ABNORMAL HIGH (ref 70–99)

## 2012-06-17 LAB — RETICULOCYTES: Retic Ct Pct: 18 % — ABNORMAL HIGH (ref 0.4–3.1)

## 2012-06-17 LAB — OCCULT BLOOD, POC DEVICE: Fecal Occult Bld: NEGATIVE

## 2012-06-17 LAB — HEMOGLOBIN AND HEMATOCRIT, BLOOD
HCT: 25.2 % — ABNORMAL LOW (ref 36.0–46.0)
Hemoglobin: 8.5 g/dL — ABNORMAL LOW (ref 12.0–15.0)

## 2012-06-17 MED ORDER — HYDROXYZINE HCL 25 MG PO TABS: 25.0000 mg | ORAL_TABLET | ORAL | Status: DC | PRN

## 2012-06-17 MED ORDER — NEPRO/CARBSTEADY PO LIQD: 237.0000 mL | ORAL | Status: DC | PRN

## 2012-06-17 MED ORDER — AMITRIPTYLINE HCL 50 MG PO TABS
50.0000 mg | ORAL_TABLET | Freq: Every day | ORAL | Status: DC
  Administered 2012-06-17 – 2012-06-18 (×2): 50 mg via ORAL
  Filled 2012-06-17 (×3): qty 1

## 2012-06-17 MED ORDER — MOXIFLOXACIN HCL IN NACL 400 MG/250ML IV SOLN
400.0000 mg | INTRAVENOUS | Status: DC
  Administered 2012-06-17 – 2012-06-18 (×2): 400 mg via INTRAVENOUS
  Filled 2012-06-17 (×3): qty 250

## 2012-06-17 MED ORDER — SODIUM CHLORIDE 0.9 % IV SOLN: 100.0000 mL | INTRAVENOUS | Status: DC | PRN

## 2012-06-17 MED ORDER — RENA-VITE PO TABS
1.0000 | ORAL_TABLET | Freq: Every day | ORAL | Status: DC
  Administered 2012-06-17 – 2012-06-18 (×2): 1 via ORAL
  Filled 2012-06-17 (×3): qty 1

## 2012-06-17 MED ORDER — HEPARIN SODIUM (PORCINE) 1000 UNIT/ML DIALYSIS
1000.0000 [IU] | INTRAMUSCULAR | Status: DC | PRN
  Filled 2012-06-17: qty 1

## 2012-06-17 MED ORDER — ONDANSETRON HCL 4 MG PO TABS: 4.0000 mg | ORAL_TABLET | Freq: Four times a day (QID) | ORAL | Status: DC | PRN

## 2012-06-17 MED ORDER — ALBUTEROL SULFATE (5 MG/ML) 0.5% IN NEBU: 2.5000 mg | INHALATION_SOLUTION | RESPIRATORY_TRACT | Status: DC | PRN

## 2012-06-17 MED ORDER — ONDANSETRON HCL 4 MG/2ML IJ SOLN
4.0000 mg | Freq: Four times a day (QID) | INTRAMUSCULAR | Status: DC | PRN
  Administered 2012-06-18: 4 mg via INTRAVENOUS
  Filled 2012-06-17: qty 2

## 2012-06-17 MED ORDER — SEVELAMER CARBONATE 800 MG PO TABS
800.0000 mg | ORAL_TABLET | Freq: Three times a day (TID) | ORAL | Status: DC
  Administered 2012-06-18 – 2012-06-19 (×4): 800 mg via ORAL
  Filled 2012-06-17 (×7): qty 1

## 2012-06-17 MED ORDER — INSULIN ASPART 100 UNIT/ML ~~LOC~~ SOLN
0.0000 [IU] | Freq: Three times a day (TID) | SUBCUTANEOUS | Status: DC
  Administered 2012-06-18 – 2012-06-19 (×4): 5 [IU] via SUBCUTANEOUS

## 2012-06-17 MED ORDER — SODIUM CHLORIDE 0.9 % IJ SOLN
3.0000 mL | Freq: Two times a day (BID) | INTRAMUSCULAR | Status: DC
  Administered 2012-06-17 – 2012-06-19 (×4): 3 mL via INTRAVENOUS

## 2012-06-17 MED ORDER — ALTEPLASE 2 MG IJ SOLR
2.0000 mg | Freq: Once | INTRAMUSCULAR | Status: AC | PRN
  Filled 2012-06-17: qty 2

## 2012-06-17 MED ORDER — PENTAFLUOROPROP-TETRAFLUOROETH EX AERO: 1.0000 "application " | INHALATION_SPRAY | CUTANEOUS | Status: DC | PRN

## 2012-06-17 MED ORDER — LIDOCAINE HCL (PF) 1 % IJ SOLN: 5.0000 mL | INTRAMUSCULAR | Status: DC | PRN

## 2012-06-17 MED ORDER — HEPARIN SODIUM (PORCINE) 5000 UNIT/ML IJ SOLN
5000.0000 [IU] | Freq: Three times a day (TID) | INTRAMUSCULAR | Status: DC
  Administered 2012-06-17 – 2012-06-18 (×3): 5000 [IU] via SUBCUTANEOUS
  Filled 2012-06-17 (×8): qty 1

## 2012-06-17 MED ORDER — HEPARIN SODIUM (PORCINE) 1000 UNIT/ML DIALYSIS
20.0000 [IU]/kg | INTRAMUSCULAR | Status: DC | PRN
  Filled 2012-06-17: qty 2

## 2012-06-17 MED ORDER — LIDOCAINE-PRILOCAINE 2.5-2.5 % EX CREA: 1.0000 "application " | TOPICAL_CREAM | CUTANEOUS | Status: DC | PRN

## 2012-06-17 MED ORDER — GABAPENTIN 100 MG PO CAPS
100.0000 mg | ORAL_CAPSULE | Freq: Every day | ORAL | Status: DC
  Administered 2012-06-17 – 2012-06-18 (×2): 100 mg via ORAL
  Filled 2012-06-17 (×3): qty 1

## 2012-06-17 MED ORDER — AMLODIPINE BESYLATE 5 MG PO TABS
5.0000 mg | ORAL_TABLET | Freq: Every day | ORAL | Status: DC
  Administered 2012-06-18 – 2012-06-19 (×2): 5 mg via ORAL
  Filled 2012-06-17 (×2): qty 1

## 2012-06-17 MED ORDER — CALCIUM ACETATE 667 MG PO CAPS
1334.0000 mg | ORAL_CAPSULE | Freq: Three times a day (TID) | ORAL | Status: DC
  Administered 2012-06-18 – 2012-06-19 (×4): 1334 mg via ORAL
  Filled 2012-06-17 (×7): qty 2

## 2012-06-17 MED ORDER — HYDROCODONE-ACETAMINOPHEN 5-325 MG PO TABS
1.0000 | ORAL_TABLET | ORAL | Status: DC | PRN
  Administered 2012-06-18: 2 via ORAL
  Filled 2012-06-17: qty 2

## 2012-06-17 MED ORDER — METOCLOPRAMIDE HCL 10 MG PO TABS
10.0000 mg | ORAL_TABLET | Freq: Three times a day (TID) | ORAL | Status: DC
  Administered 2012-06-18 – 2012-06-19 (×5): 10 mg via ORAL
  Filled 2012-06-17 (×7): qty 1

## 2012-06-17 MED ORDER — GUAIFENESIN-DM 100-10 MG/5ML PO SYRP
5.0000 mL | ORAL_SOLUTION | ORAL | Status: DC | PRN
  Filled 2012-06-17: qty 5

## 2012-06-17 MED ORDER — INSULIN ASPART 100 UNIT/ML ~~LOC~~ SOLN
10.0000 [IU] | Freq: Once | SUBCUTANEOUS | Status: AC
  Administered 2012-06-17: 10 [IU] via SUBCUTANEOUS

## 2012-06-17 MED ORDER — ASPIRIN EC 81 MG PO TBEC
81.0000 mg | DELAYED_RELEASE_TABLET | Freq: Every day | ORAL | Status: DC
  Administered 2012-06-18 – 2012-06-19 (×2): 81 mg via ORAL
  Filled 2012-06-17 (×2): qty 1

## 2012-06-17 MED ORDER — PANTOPRAZOLE SODIUM 40 MG IV SOLR
40.0000 mg | Freq: Two times a day (BID) | INTRAVENOUS | Status: DC
  Administered 2012-06-17 – 2012-06-19 (×4): 40 mg via INTRAVENOUS
  Filled 2012-06-17 (×5): qty 40

## 2012-06-17 MED ORDER — PNEUMOCOCCAL VAC POLYVALENT 25 MCG/0.5ML IJ INJ
0.5000 mL | INJECTION | INTRAMUSCULAR | Status: AC
  Administered 2012-06-18: 0.5 mL via INTRAMUSCULAR
  Filled 2012-06-17: qty 0.5

## 2012-06-17 NOTE — H&P (Addendum)
[facility]                                                                                    Patient Demographics  [name], is a 41 y.o. female  CSN: [id number]  MRN: [id number]  DOB - [birth year]  Admit Date - 06/17/2012  Outpatient Primary MD for the patient is Lieutenant Diego, MD   With History of -  Past Medical History  Diagnosis Date  . GERD (gastroesophageal reflux disease)   . Diabetes mellitus     IDDM  . Hypertension     states has been on med. x "a long time"  . ESRF (end stage renal failure)     dialysis M,W, F; left thigh AV graft  . History of gangrene     left foot  . Hx MRSA infection   . Carpal tunnel syndrome of left wrist 12/2011      Past Surgical History  Procedure Date  . Av fistula placement 10/18/2008    right thigh AV graft  . Finger debridement 06/20/2010    right middle finger  . Finger amputation 05/27/2010    right middle  . Thrombectomy / arteriovenous graft revision 01/14/2010    left superficial femoral artery; left thigh AV graft placement  . 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 Leg amputation below knee 10/11/2009    right  . Central venous catheter insertion 08/06/2009    removal right IJ Diatek cath., placement left IJ Diatek cath.  . 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 Foot amputation 04/04/2009    left transtibial amputation  . Excision / curettage bone cyst talus / calcaneus 03/04/2009    partial calcaneal exc. left; placement wound VAC  . Groin debridement 11/16/2008    right rectus femoris muscle flap to right groin wound; right groing debridement  . Femoral endarterectomy 11/06/2008    right common femoral; embolectomy right femoral artery; removal right femoral AV graft  . Central venous catheter insertion 07/10/2008; 12/31/2006; 01/19/2006; 06/19/2005; 09/19/2004; 09/08/2004    right IJ Diatek catheter  . Av fistula repair 07/08/2008    removal left upper arm AV graft  . Thrombectomy /  arteriovenous graft revision 05/11/2007;12/31/2006; 01/18/2006; 12/16/2005    left upper arm  . Dialysis fistula creation 01/20/2007    left upper arm AV graft  . Dialysis fistula creation 01/28/2006    right upper arm AV graft  . Thrombectomy / arteriovenous graft revision 10/26/2005    left forearm AV graft  . Dialysis fistula creation 10/04/2005    left forearm AV graft  . Av fistula repair 07/14/2005    removal infected right forearm AV graft  . Thrombectomy / arteriovenous graft revision 03/11/2005; 10/20/2004    right forearm AV graft  . Dialysis fistula creation 09/10/2004    right forearm AV graft  . Av fistula placement 07/18/2004    creation left AV fistula, radial to cephalic  . Finger exploration 07/13/2002    and repair left middle finger  . Carpal tunnel release 01/26/2012    Procedure: CARPAL TUNNEL RELEASE;  Surgeon: Nicki Reaper, MD;  Location: [facility];  Service: Orthopedics;  Laterality: Left;    in for   Chief Complaint  Patient presents with  . Anemia     HPI  [name]  is a 41 y.o. female, with H/O ESRD M,W,F dialysis, bilateral AKA, poor dialysis access with last remaninig site being present L.fem Graft, DM-2, recent graft infection and bleed finished antibiotics under the care of Dr Synthia Innocent, comes in from Dialysis center where blood work showed Hb <5 . Patient denies any bleed from graft site, no dark stool or blood in stool, no chest-abd pain, no SOB, mild dry cough, no other subjective complains.   Review of Systems    In addition to the HPI above,   No Fever-chills, No Headache, No changes with Vision or hearing, No problems swallowing food or Liquids, No Chest pain, Mild dry Cough but no Shortness of Breath, No Abdominal pain, No Nausea or Vommitting, Bowel movements are regular, No Blood in stool or Urine, No dysuria, No new skin rashes or bruises, No new joints pains-aches,  No new weakness, tingling, numbness in any  extremity, No recent weight gain or loss, No polyuria, polydypsia or polyphagia, No significant Mental Stressors.  A full 10 point Review of Systems was done, except as stated above, all other Review of Systems were negative.   Social History History  Substance Use Topics  . Smoking status: Never Smoker   . Smokeless tobacco: Never Used  . Alcohol Use: No      Family History Family History  Problem Relation Age of Onset  . Diabetes Mother   . Heart disease Mother   . Hypertension Mother   . Heart attack Mother 38  . Diabetes Sister   . Hypertension Brother   . Heart attack Brother       Prior to Admission medications   Medication Sig Start Date End Date Taking? Authorizing Provider  amitriptyline (ELAVIL) 50 MG tablet Take 50 mg by mouth at bedtime. 05/04/12  Yes Historical Provider, MD  amLODipine (NORVASC) 10 MG tablet Take 5 mg by mouth daily.   Yes Historical Provider, MD  aspirin EC 81 MG tablet Take 81 mg by mouth daily.   Yes Historical Provider, MD  calcium acetate (PHOSLO) 667 MG capsule Take 1,334 mg by mouth 3 (three) times daily with meals.    Yes Historical Provider, MD  gabapentin (NEURONTIN) 100 MG capsule Take 100 mg by mouth at bedtime. 05/12/12  Yes Historical Provider, MD  hydrOXYzine (ATARAX/VISTARIL) 25 MG tablet Take 25 mg by mouth 3 (three) times daily as needed. For itching   Yes Historical Provider, MD  ibuprofen (ADVIL,MOTRIN) 200 MG tablet Take 400 mg by mouth every 6 (six) hours as needed. pain   Yes Historical Provider, MD  insulin aspart protamine-insulin aspart (NOVOLOG 70/30) (70-30) 100 UNIT/ML injection Inject 6-8 Units into the skin 2 (two) times daily with a meal. Take 8 units in the morning and 6 units in the evening   Yes Historical Provider, MD  metoCLOPramide (REGLAN) 10 MG tablet Take 10 mg by mouth 3 (three) times daily as needed. For stomach upset   Yes Historical Provider, MD  multivitamin (RENA-VIT) TABS tablet Take 1 tablet by mouth  at bedtime. 04/29/12  Yes Renae Fickle, MD  RENVELA 800 MG tablet Take 800 mg by mouth 3 (three) times daily with meals.  05/04/12  Yes Historical Provider, MD    Allergies  Allergen Reactions  . Percocet (Oxycodone-Acetaminophen) Other (  See Comments)    GI UPSET  . Soap Itching    IVORY SOAP    Physical Exam  Vitals  Blood pressure 98/63, pulse 98, temperature 98.7 F (37.1 C), temperature source Oral, resp. rate 20, SpO2 98.00%.   1. General middle aged obese AA female lying in bed in NAD,     2. Normal affect and insight, Not Suicidal or Homicidal, Awake Alert, Oriented X 3.  3. No F.N deficits, ALL C.Nerves Intact, Strength 5/5 all 2 upper extremities, Sensation intact all 4 extremities, Bilateral BKA, L Fem.Dialysis graft site stable, Plantars down going.  4. Ears and Eyes appear Normal, Conjunctivae clear, PERRLA. Moist Oral Mucosa.  5. Supple Neck, No JVD, No cervical lymphadenopathy appriciated, No Carotid Bruits.  6. Symmetrical Chest wall movement, Good air movement bilaterally, CTAB.  7. RRR, No Gallops, Rubs or Murmurs, No Parasternal Heave.  8. Positive Bowel Sounds, Abdomen Soft, Non tender, No organomegaly appriciated,No rebound -guarding or rigidity. Stool brown and Occ -ve.  9.  No Cyanosis, Normal Skin Turgor, No Skin Rash or Bruise. Dry skin  10. Good muscle tone,  joints appear normal , no effusions, Normal ROM.  11. No Palpable Lymph Nodes in Neck or Axillae     Data Review  CBC  Lab 06/17/12 1052  WBC 12.6*  HGB 5.2*  HCT 15.5*  PLT 369  MCV 96.3  MCH 32.3  MCHC 33.5  RDW 18.1*  LYMPHSABS --  MONOABS --  EOSABS --  BASOSABS --  BANDABS --   ------------------------------------------------------------------------------------------------------------------  Chemistries   Lab 06/17/12 1052  NA 140  K 3.3*  CL 96  CO2 30  GLUCOSE 129*  BUN 20  CREATININE 3.40*  CALCIUM 9.1  MG --  AST --  ALT --  ALKPHOS --  BILITOT --    ------------------------------------------------------------------------------------------------------------------ CrCl is unknown because both a height and weight (above a minimum accepted value) are required for this calculation. ------------------------------------------------------------------------------------------------------------------ No results found for this basename: TSH,T4TOTAL,FREET3,T3FREE,THYROIDAB in the last 72 hours   Coagulation profile No results found for this basename: INR:5,PROTIME:5 in the last 168 hours ------------------------------------------------------------------------------------------------------------------- No results found for this basename: DDIMER:2 in the last 72 hours -------------------------------------------------------------------------------------------------------------------  Cardiac Enzymes No results found for this basename: CK:3,CKMB:3,TROPONINI:3,MYOGLOBIN:3 in the last 168 hours ------------------------------------------------------------------------------------------------------------------ No components found with this basename: POCBNP:3   ---------------------------------------------------------------------------------------------------------------  Urinalysis    Component Value Date/Time   COLORURINE YELLOW 06/20/2008 0827   APPEARANCEUR TURBID* 06/20/2008 0827   LABSPEC 1.025 06/20/2008 0827   PHURINE 7.5 06/20/2008 0827   GLUCOSEU NEGATIVE 06/20/2008 0827   HGBUR LARGE* 06/20/2008 0827   BILIRUBINUR SMALL* 06/20/2008 0827   KETONESUR 15* 06/20/2008 0827   PROTEINUR >300* 06/20/2008 0827   UROBILINOGEN 0.2 06/20/2008 0827   NITRITE NEGATIVE 06/20/2008 0827   LEUKOCYTESUR LARGE* 06/20/2008 0827        Imaging results:   Dg Chest Port 1 View  06/17/2012  *RADIOLOGY REPORT*  Clinical Data: Shortness of breath.  Productive cough.  PORTABLE CHEST - 1 VIEW  Comparison: 04/25/2012  Findings: Mild cardiomegaly.   Bibasilar atelectasis or infiltrates, unchanged on the left, increased on the right.  No effusions or edema.  No acute bony abnormality.  IMPRESSION: Bibasilar airspace opacities, increasing on the right.  These could represent atelectasis or pneumonia.   Original Report Authenticated By: Cyndie Chime, M.D.       Assessment & Plan  1. Severe anemia in a patient with ESRD recent left femoral graft site infection,  bacteremia and bleed - patient denies any abdominal pain, denies any blood in stool, denies any dark stools, left femoral graft site looks stable no hematoma on exam, this could be anemia of chronic disease due to ESRD however her last hemoglobin was 10 and today at the outpatient dialysis center hemoglobin was noted to be 4.8, patient is relatively asymptomatic therefore anemia could be subacute, she is Hemoccult negative on exam, chest x-ray does not show any free air under the diaphragm i.e. bowel perforation, I have ordered an anemia panel, have called nephrology to arrange for dialysis which is due today along with 3 units of packed RBC transfusion during dialysis. For now we'll place her on IV PPI twice a day, monitor H&H q. 6 hours.    2. ESRD,  Patient has limited vascular access, her last access is the present left femoral graft site placed by Dr. Arbie Cookey, she has a pending appointment in 3 days with him, graft site for now looks stable however I have requested Dr. Charlean Sanfilippo to evaluate the graft site from his standpoint to make sure there is no occult bleeding/infection there as this has been a problem in the recent past. I have also requested nephrology to arrange for dialysis along with transfusion today.   3. Diabetes mellitus- will check A1c, place her on sliding scale insulin with meals.    4. Recent graft site infection, Acinetobacter bacteremia. Patient has finished her antibiotics under the care of Dr. Algis Liming, no fever, mild leukocytosis, will clinically monitor, will check a  set of blood cultures and follow clinically.    5. Mild dry cough, mild leukocytosis, chest x-ray showing atelectasis. Clinically she does not have pneumonia however she could have URI. She denies any fever chills at home she denies any shortness of breath. Will check sputum Gram stain and culture and place her on IV Avelox for now and monitor. PRN Nebs-o2.    6. Mild sinus tachycardia without any signs of distress or discomfort. Likely due to severe anemia. We'll transfuse and monitor. She is also due for her dialysis which will be done today.   DVT Prophylaxis - Heparin  AM Labs Ordered, also please review Full Orders  Family Communication: Admission, patients condition and plan of care including tests being ordered have been discussed with the patient   who indicates understanding and agree with the plan and Code Status.  Code Status Full  Disposition Plan: Home  Time spent in minutes : 35  Condition GUARDED   Leroy Sea M.D on 06/17/2012 at 2:06 PM  Between 7am to 7pm - Pager - [phone]  After 7pm go to [email or web address] - password [email or web address]  And look for the night coverage person covering me after hours  [facility] Office  [phone]

## 2012-06-17 NOTE — Consult Note (Signed)
Asked by Dr Thedore Mins to evaluate pt left thigh graft as possible source of blood loss anemia.  Pt denies any recent bleeding episodes from graft.  PE: Filed Vitals:   06/17/12 1400 06/17/12 1437 06/17/12 1446 06/17/12 1453  BP:  117/75 125/78 125/69  Pulse: 106 102 102 104  Temp:  97.9 F (36.6 C)  98.6 F (37 C)  TempSrc:  Oral  Oral  Resp: 15 12 10 17   SpO2: 97%      Left thigh graft, no areas of ulceration, currently on dialysis, unable to see groin scar but pt states no incision there no drainage no bleeding  Assessment: Doubt graft is source of anemia  Plan: Pt has scheduled follow up with Dr Arbie Cookey next week.  If she remains as inpt we will see her again in hospital next week.  Fabienne Bruns, MD [facility] Office: [phone] Pager: [phone]'

## 2012-06-17 NOTE — ED Notes (Signed)
IV attempted x3 by this RN

## 2012-06-17 NOTE — Consult Note (Signed)
I have seen and examined this patient and agree with the plan of care. Patient with profound anemia despite adequate Epo. Evaluation for management of ESRD. Vitals are stable with no distress and well controlled volume status. Evaluation of out patient records indicates that he receives dialysis MWF at [place]. He is dialyzed for 4 hrs  on a 180 non-reuse dialyzer with a 2.25 calcium and 2 potassium dialysate and linear sodium 138.He receives erythropoietin and iron to keep his Hb above 10 and IV Vitamin D and phosphate binders to keep his Bone mineral goals at [facility]. He has a functioning Thigh Graft with no problems during usage. We shall proceed with in hospital scheduled dialysis

## 2012-06-17 NOTE — ED Notes (Addendum)
Pt was in dialysis today and complained of leg pain. At that time labs were drawn and Hb was 4. Pt was only able to receive 2 hours of her 4 hour treatment.

## 2012-06-17 NOTE — Procedures (Signed)
I have personally attended patient's dialysis session.  She received 3 units of PRBC's on HD for a Hb of 5.2.  Received tight heparin.  Access = left thigh graft. No issues with cannulation.   Treatment being terminated 19 minutes early due to clotted system.   Tramond Slinker B

## 2012-06-17 NOTE — Consult Note (Signed)
[facility] Renal Consultation Note    Indication for Consultation:  Management of ESRD/hemodialysis; anemia, hypertension/volume and secondary hyperparathyroidism  HPI: [name] is a 41 y.o. AA female with ESRD, complex PMHx and vascular access issues had acute onset of left thigh/graft pain in her left AKA approximately 1.5 hours into dialysis.  Her blood was noted to be light red and her dialysis center Hgb per Hemocue was 4.  She was emergently transferred to the [facility] ED where she was found to have a Hgb of 5.2.  Her most recent Hgb was 9.4 10/9. Her Epo was decreased from 18K to 13.4 K on 10/9 for a prior Hgb of 11.1on 10/2, though prior to that in September Hgbs per in the 8-9 range.   A panic value for routine monthly Hgb  drawn 10/16 was 5.1 received today. She has not had any surgical procedures this month.  Of significance is that Hgb drawn today is the same as Wednesday.  She does report feeling weak/shakey last week, but this had been attributed to low blood sugar and was seen in the ED for this.  BS was low, but Hgb was not drawn.    She denies abdominal or back pain, change in color of stools, hematemesis (though she drank some pepsi this am and said she vomited it).  She denies fever or shaking chills, but has cough with yellow sputum and has been feeling weak and cold, but not dizzy (though she is not ambulatory)   Past Medical History  Diagnosis Date  . GERD (gastroesophageal reflux disease)   . Diabetes mellitus     IDDM  . Hypertension     states has been on med. x "a long time"  . ESRF (end stage renal failure)     dialysis M,W, F; left thigh AV graft  . History of gangrene     left foot  . Hx MRSA infection   . 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 Av fistula placement 10/18/2008    right thigh AV graft  . Finger debridement 06/20/2010    right middle finger  . Finger amputation 05/27/2010      right middle  . Thrombectomy / arteriovenous graft revision 01/14/2010    left superficial femoral artery; left thigh AV graft placement  . 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 Leg amputation below knee 10/11/2009    right  . Central venous catheter insertion 08/06/2009    removal right IJ Diatek cath., placement left IJ Diatek cath.  . 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 Foot amputation 04/04/2009    left transtibial amputation  . Excision / curettage bone cyst talus / calcaneus 03/04/2009    partial calcaneal exc. left; placement wound VAC  . Groin debridement 11/16/2008    right rectus femoris muscle flap to right groin wound; right groing debridement  . Femoral endarterectomy 11/06/2008    right common femoral; embolectomy right femoral artery; removal right femoral AV graft  . Central venous catheter insertion 07/10/2008; 12/31/2006; 01/19/2006; 06/19/2005; 09/19/2004; 09/08/2004    right IJ Diatek catheter  . Av fistula repair 07/08/2008    removal left upper arm AV graft  . Thrombectomy / arteriovenous graft revision 05/11/2007;12/31/2006; 01/18/2006; 12/16/2005    left upper arm  . Dialysis fistula creation 01/20/2007    left upper arm AV graft  . Dialysis fistula  creation 01/28/2006    right upper arm AV graft  . Thrombectomy / arteriovenous graft revision 10/26/2005    left forearm AV graft  . Dialysis fistula creation 10/04/2005    left forearm AV graft  . Av fistula repair 07/14/2005    removal infected right forearm AV graft  . Thrombectomy / arteriovenous graft revision 03/11/2005; 10/20/2004    right forearm AV graft  . Dialysis fistula creation 09/10/2004    right forearm AV graft  . Av fistula placement 07/18/2004    creation left AV fistula, radial to cephalic  . Finger exploration 07/13/2002    and repair left middle finger  . Carpal tunnel release 01/26/2012    Procedure: CARPAL TUNNEL RELEASE;  Surgeon: Nicki Reaper, MD;  Location: [facility];  Service: Orthopedics;  Laterality: Left;   Family History  Problem Relation Age of Onset  . Diabetes Mother   . Heart disease Mother   . Hypertension Mother   . Heart attack Mother 52  . Diabetes Sister   . Hypertension Brother   . Heart attack Brother    Social History:  reports that she has never smoked. She has never used smokeless tobacco. She reports that she does not drink alcohol or use illicit drugs. Allergies  Allergen Reactions  . Percocet (Oxycodone-Acetaminophen) Other (See Comments)    GI UPSET  . Soap Itching    IVORY SOAP   Prior to Admission medications   Medication Sig Start Date End Date Taking? Authorizing Provider  amitriptyline (ELAVIL) 50 MG tablet Take 50 mg by mouth at bedtime. 05/04/12  Yes Historical Provider, MD  amLODipine (NORVASC) 10 MG tablet Take 5 mg by mouth daily.   Yes Historical Provider, MD  aspirin EC 81 MG tablet Take 81 mg by mouth daily.   Yes Historical Provider, MD  calcium acetate (PHOSLO) 667 MG capsule Take 1,334 mg by mouth 3 (three) times daily with meals.    Yes Historical Provider, MD  gabapentin (NEURONTIN) 100 MG capsule Take 100 mg by mouth at bedtime. 05/12/12  Yes Historical Provider, MD  hydrOXYzine (ATARAX/VISTARIL) 25 MG tablet Take 25 mg by mouth 3 (three) times daily as needed. For itching   Yes Historical Provider, MD  ibuprofen (ADVIL,MOTRIN) 200 MG tablet Take 400 mg by mouth every 6 (six) hours as needed. pain   Yes Historical Provider, MD  insulin aspart protamine-insulin aspart (NOVOLOG 70/30) (70-30) 100 UNIT/ML injection Inject 6-8 Units into the skin 2 (two) times daily with a meal. Take 8 units in the morning and 6 units in the evening   Yes Historical Provider, MD  metoCLOPramide (REGLAN) 10 MG tablet Take 10 mg by mouth 3 (three) times daily as needed. For stomach upset   Yes Historical Provider, MD  multivitamin (RENA-VIT) TABS tablet Take 1 tablet by mouth at bedtime. 04/29/12  Yes Renae Fickle, MD   RENVELA 800 MG tablet Take 800 mg by mouth 3 (three) times daily with meals.  05/04/12  Yes Historical Provider, MD   Current Facility-Administered Medications  Medication Dose Route Frequency Provider Last Rate Last Dose  . 0.9 %  sodium chloride infusion  100 mL Intravenous PRN Sheffield Slider, PA      . 0.9 %  sodium chloride infusion  100 mL Intravenous PRN Sheffield Slider, PA      . alteplase (CATHFLO ACTIVASE) injection 2 mg  2 mg Intracatheter Once PRN Sheffield Slider, PA      .  feeding supplement (NEPRO CARB STEADY) liquid 237 mL  237 mL Oral PRN Sheffield Slider, PA      . heparin injection 1,000 Units  1,000 Units Dialysis PRN Sheffield Slider, PA      . heparin injection 20 Units/kg  20 Units/kg Dialysis PRN Sheffield Slider, PA      . lidocaine (XYLOCAINE) 1 % injection 5 mL  5 mL Intradermal PRN Sheffield Slider, PA      . lidocaine-prilocaine (EMLA) cream 1 application  1 application Topical PRN Sheffield Slider, PA      . pentafluoroprop-tetrafluoroeth (GEBAUERS) aerosol 1 application  1 application Topical PRN Sheffield Slider, PA       Current Outpatient Prescriptions  Medication Sig Dispense Refill  . amitriptyline (ELAVIL) 50 MG tablet Take 50 mg by mouth at bedtime.      Marland Kitchen amLODipine (NORVASC) 10 MG tablet Take 5 mg by mouth daily.      Marland Kitchen aspirin EC 81 MG tablet Take 81 mg by mouth daily.      . calcium acetate (PHOSLO) 667 MG capsule Take 1,334 mg by mouth 3 (three) times daily with meals.       . gabapentin (NEURONTIN) 100 MG capsule Take 100 mg by mouth at bedtime.      . hydrOXYzine (ATARAX/VISTARIL) 25 MG tablet Take 25 mg by mouth 3 (three) times daily as needed. For itching      . ibuprofen (ADVIL,MOTRIN) 200 MG tablet Take 400 mg by mouth every 6 (six) hours as needed. pain      . insulin aspart protamine-insulin aspart (NOVOLOG 70/30) (70-30) 100 UNIT/ML injection Inject 6-8 Units into the skin 2 (two) times daily with a meal. Take 8 units in the  morning and 6 units in the evening      . metoCLOPramide (REGLAN) 10 MG tablet Take 10 mg by mouth 3 (three) times daily as needed. For stomach upset      . multivitamin (RENA-VIT) TABS tablet Take 1 tablet by mouth at bedtime.  90 tablet  3  . RENVELA 800 MG tablet Take 800 mg by mouth 3 (three) times daily with meals.       Marland Kitchen DISCONTD: amLODipine (NORVASC) 10 MG tablet Take 1 tablet (10 mg total) by mouth daily.  90 tablet  3   Labs: Basic Metabolic Panel:  Lab 06/17/12 9528  NA 140  K 3.3*  CL 96  CO2 30  GLUCOSE 129*  BUN 20  CREATININE 3.40*  CALCIUM 9.1  ALB --  PHOS --  CBC:  Lab 06/17/12 1052  WBC 12.6*  NEUTROABS --  HGB 5.2*  HCT 15.5*  MCV 96.3  PLT 369  Studies/Results: Dg Chest Port 1 View  06/17/2012  *RADIOLOGY REPORT*  Clinical Data: Shortness of breath.  Productive cough.  PORTABLE CHEST - 1 VIEW  Comparison: 04/25/2012  Findings: Mild cardiomegaly.  Bibasilar atelectasis or infiltrates, unchanged on the left, increased on the right.  No effusions or edema.  No acute bony abnormality.  IMPRESSION: Bibasilar airspace opacities, increasing on the right.  These could represent atelectasis or pneumonia.   Original Report Authenticated By: Cyndie Chime, M.D.    ROS: as per HPI otherwise negative  Physical Exam: Filed Vitals:   06/17/12 1100 06/17/12 1230 06/17/12 1330 06/17/12 1400  BP: 126/71     Pulse: 103 100 111 106  Temp:      TempSrc:      Resp: 16 21  22 15  SpO2: 100% 98% 98% 97%     General: Well developed, obese, in no acute distress. Head: Normocephalic, atraumatic, sclera non-icteric, mucus membranes are moist; few teeth Neck: Supple. JVD not elevated. Lungs: Clear bilaterally to auscultation on right; wheezes and soft  Rhonchi on the left. Breathing is unlabored. Heart:  Tachy regular Back: no CVA tenderness Abdomen: Soft, obese non-tender, non-distended with normoactive bowel sounds. No rebound/guarding. No obvious abdominal  masses. Lower extremities: bilateral high AKA without edema; no open wounds  Neuro: Alert and oriented X 3. Moves all extremities spontaneously. Psych:  Responds to questions appropriately with a normal affect. Skin: dry; no bruising noted Dialysis Access: left thigh graft needles in tact; graft patent; no tenderness or edema of left AKA stump  Dialysis Orders: Center: [facility]  on MWF EDW 81 HD Bath 2K 2.25 Ca  Time 4 Heparin 7000. Access left thigh graft BFR 400 DFR 800 Zemplar 6 mcg IV/HD Epogen 13,400   Units IV/HD  Venofer 100 /wed   Assessment/Plan: 1. Profound anemia - etiology unclear; heme neg; last Hgb 9.4 on10/9; hgb 5.1 10/16 and 5.2 today- at least stable x 2 days-; Epo dose decreased 10/9 would not have caused such a large drop in Hgb.  Heme neg with no back or abdominal pain; checking B12, folate and Fe.  Received Epo today- since she is being transfused 3 units PRBC on HD will not redose Epo today 2.  ESRD -  MWF - will complete treatment and extend treatment longer if necessary to transfuse longer. 3.  Hypertension/volume  - on norvasc.  BP remarkably good, but has been dropping pressure during last HD treatments most likely due to profound anemia 4. Type 2 DM - per primary  5. Metabolic bone disease - already received zemplar today 6. Nutrition - high protein renal diet + prostat when orders released 7. Hx acinetobacter bacteremia - with replacement of part of graft - repeat emperic BC to be drawn; has mild leukocytosis 8. Leukocytosis with cough, afebrile - empiric avelox; equivocal cxr for pna  Sheffield Slider, PA-C [facility] Beeper [phone] 06/17/2012, 2:37 PM

## 2012-06-18 LAB — TYPE AND SCREEN
Antibody Screen: NEGATIVE
Unit division: 0

## 2012-06-18 LAB — GLUCOSE, CAPILLARY
Glucose-Capillary: 251 mg/dL — ABNORMAL HIGH (ref 70–99)
Glucose-Capillary: 259 mg/dL — ABNORMAL HIGH (ref 70–99)
Glucose-Capillary: 278 mg/dL — ABNORMAL HIGH (ref 70–99)
Glucose-Capillary: 276 mg/dL — ABNORMAL HIGH (ref 70–99)

## 2012-06-18 LAB — VITAMIN B12: Vitamin B-12: 885 pg/mL (ref 211–911)

## 2012-06-18 LAB — CBC
HCT: 24.9 % — ABNORMAL LOW (ref 36.0–46.0)
Hemoglobin: 8.7 g/dL — ABNORMAL LOW (ref 12.0–15.0)
MCHC: 34.9 g/dL (ref 30.0–36.0)
RBC: 2.69 MIL/uL — ABNORMAL LOW (ref 3.87–5.11)
WBC: 11.6 10*3/uL — ABNORMAL HIGH (ref 4.0–10.5)

## 2012-06-18 LAB — BASIC METABOLIC PANEL
BUN: 17 mg/dL (ref 6–23)
Chloride: 98 mEq/L (ref 96–112)
GFR calc Af Amer: 16 mL/min — ABNORMAL LOW (ref >90)
Potassium: 4.1 mEq/L (ref 3.5–5.1)
Sodium: 137 mEq/L (ref 135–145)

## 2012-06-18 LAB — HEMOGLOBIN A1C
Hgb A1c MFr Bld: 6 % — ABNORMAL HIGH (ref <5.7)
Mean Plasma Glucose: 126 mg/dL — ABNORMAL HIGH (ref <117)

## 2012-06-18 LAB — PROTIME-INR
INR: 1.04 (ref 0.00–1.49)
Prothrombin Time: 13.5 seconds (ref 11.6–15.2)

## 2012-06-18 LAB — FOLATE: Folate: >20 ng/mL

## 2012-06-18 LAB — LACTATE DEHYDROGENASE: LDH: 236 U/L (ref 94–250)

## 2012-06-18 MED ORDER — PEG 3350-KCL-NA BICARB-NACL 420 G PO SOLR
4000.0000 mL | Freq: Once | ORAL | Status: AC
  Administered 2012-06-18: 4000 mL via ORAL
  Filled 2012-06-18: qty 4000

## 2012-06-18 MED ORDER — SODIUM CHLORIDE 0.9 % IV SOLN
INTRAVENOUS | Status: DC
  Administered 2012-06-18: 1000 mL via INTRAVENOUS

## 2012-06-18 NOTE — Progress Notes (Signed)
[facility] Progress Note  Subjective: Wants to go home.  Feels much better  Objective Filed Vitals:   06/17/12 1710 06/17/12 1730 06/17/12 2115 06/18/12 0532  BP: 142/74 142/88 117/73 154/91  Pulse: 96 99 88 92  Temp: 99.7 F (37.6 C) 98.6 F (37 C) 98.7 F (37.1 C) 97.8 F (36.6 C)  TempSrc: Oral Oral Oral Oral  Resp: 15 20 20 20   Height:   5\' 3"  (1.6 m)   Weight:   83.9 kg (184 lb 15.5 oz)   SpO2: 97% 99% 98% 93%   Physical Exam General: Sleeping. Rouses easily and quite talkative Heart:  RRR Lungs: soft rhochi on right otherwise clear Abdomen: obese, soft NT Extremities: bilateral AKA no edema Dialysis Access: left thigh AVGG patent  Dialysis Orders: Center: [facility] on MWF  EDW 81 HD Bath 2K 2.25 Ca Time 4 Heparin 7000. Access left thigh graft BFR 400 DFR 800  Zemplar 6 mcg IV/HD Epogen 13,400 Units IV/HD Venofer 100 /wed   Assessment/Plan: 1. Profound anemia - etiology unclear; heme neg; last Hgb 9.4 on10/9; hgb 5.1 10/16 and 5.2 today- at least stable x 2 days-; Epo dose decreased 10/9 would not have caused such a large drop in Hgb. Heme neg with no back or abdominal pain;  She thinks the only possible blood loss is related to prolonged graft bleeding, though that really can't explain a 4 gm drop.  Folate and B12 adequate.  Fe studies not that bad.  Ferritin increased 1200, but would continue IV Fe to help increase Hgb.  Hgb now stable in the 8s.  GI to see per primary and then probable d./c today.  Will follow Hgb serially after discharge. 2. ESRD - MWF -  3. Hypertension/volume - on norvasc. Volume up some.  Should be able to remove at next HD treatment now that Hgb up. 4. Type 2 DM - per primary  5. Metabolic bone disease - already received zemplar today 6. Nutrition - high protein renal diet + prostat when orders released 7. Hx acinetobacter bacteremia - with replacement of part of graft - repeat emperic BC to be drawn; has mild leukocytosis - could be  related to #8 8. Leukocytosis with cough, afebrile - empiric avelox; equivocal cxr for pna   Sheffield Slider, PA-C [facility] Beeper [phone]  06/18/2012,8:57 AM  LOS: 1 day    Additional Objective Labs: Basic Metabolic Panel:  Lab 06/18/12 4540 06/17/12 1052  NA 137 140  K 4.1 3.3*  CL 98 96  CO2 27 30  GLUCOSE 230* 129*  BUN 17 20  CREATININE 3.70* 3.40*  CALCIUM 8.7 9.1  ALB -- --  PHOS -- --  CBC:  Lab 06/18/12 0505 06/17/12 2336 06/17/12 2131 06/17/12 1052  WBC 11.6* -- -- 12.6*  NEUTROABS -- -- -- --  HGB 8.7* 8.6* 8.5* --  HCT 24.9* 25.8* 25.2* --  MCV 92.6 -- -- 96.3  PLT 195 -- -- 369  CBG:  Lab 06/18/12 0738 06/17/12 2315 06/17/12 2118 06/17/12 1739  GLUCAP 251* 308* 411* 197*   Iron Studies:  Basename 06/17/12 1356  IRON 48  TIBC 218*  TRANSFERRIN --  FERRITIN 1262*    Medications:      . amitriptyline  50 mg Oral QHS  . amLODipine  5 mg Oral Daily  . aspirin EC  81 mg Oral Daily  . calcium acetate  1,334 mg Oral TID WC  . gabapentin  100 mg Oral QHS  .  heparin subcutaneous  5,000 Units Subcutaneous Q8H  . insulin aspart  0-9 Units Subcutaneous TID WC  . insulin aspart  10 Units Subcutaneous Once  . metoCLOPramide  10 mg Oral TID AC  . moxifloxacin  400 mg Intravenous Q24H  . multivitamin  1 tablet Oral QHS  . pantoprazole (PROTONIX) IV  40 mg Intravenous Q12H  . pneumococcal 23 valent vaccine  0.5 mL Intramuscular Tomorrow-1000  . sevelamer  800 mg Oral TID WC  . sodium chloride  3 mL Intravenous Q12H

## 2012-06-18 NOTE — Progress Notes (Signed)
[facility] PROGRESS NOTE  [name] CBJ:[id number] DOB: 1971/03/13 DOA: 06/17/2012 PCP: Mary Diego, MD  Assessment/Plan: Severe anemia -No clear source at this time -Patient is asymptomatic, hemodynamically stable -Suspect may be due to anemia of CKD -Due to the acuity of anemia, consult gastroenterology for possible further workup -Check LDH, haptoglobin -B12 85, folate greater than 20, iron saturation 22%, reticulocyte 18% -Monitor serial H&H's -Appreciate Dr. Evelina Wang followup--no active bleed from graft site -Consulted and discussed with Dr. Jeani Wang -Patient initially refused, but now has consented to have endoscopy tomorrow ESRD -Appreciate renal followup Diabetes mellitus type 2 -hold 70/30 insulin for now as patient will be n.p.o. for endoscopy -Continue NovoLog sliding scale Leukocytosis -Some improvement today -Chest x-ray with bibasilar opacities/atelectasis -Continue empiric moxifloxacin -Followup blood cultures     Family Communication:   Pt at beside Disposition Plan:   Home when medically stable    Antibiotics:  Moxifloxacin 06/17/2012>>>    Procedures/Studies: Dg Chest Port 1 View  06/17/2012  *RADIOLOGY REPORT*  Clinical Data: Shortness of breath.  Productive cough.  PORTABLE CHEST - 1 VIEW  Comparison: 04/25/2012  Findings: Mild cardiomegaly.  Bibasilar atelectasis or infiltrates, unchanged on the left, increased on the right.  No effusions or edema.  No acute bony abnormality.  IMPRESSION: Bibasilar airspace opacities, increasing on the right.  These could represent atelectasis or pneumonia.   Original Report Authenticated By: [name], M.D.          Subjective: Patient is feeling much better today. She denies any dizziness, chest discomfort, shortness breath, nausea, vomiting, diarrhea, abdominal pain. Denies any menses, hematochezia, melena, hemoptysis.  Objective: Filed Vitals:   06/17/12 1710 06/17/12 1730  06/17/12 2115 06/18/12 0532  BP: 142/74 142/88 117/73 154/91  Pulse: 96 99 88 92  Temp: 99.7 F (37.6 C) 98.6 F (37 C) 98.7 F (37.1 C) 97.8 F (36.6 C)  TempSrc: Oral Oral Oral Oral  Resp: 15 20 20 20   Height:   5\' 3"  (1.6 m)   Weight:   83.9 kg (184 lb 15.5 oz)   SpO2: 97% 99% 98% 93%    Intake/Output Summary (Last 24 hours) at 06/18/12 0847 Last data filed at 06/18/12 0500  Gross per 24 hour  Intake   1630 ml  Output    375 ml  Net   1255 ml   Weight change:  Exam:   General:  Pt is alert, follows commands appropriately, not in acute distress  HEENT: No icterus, No thrush,  [place]/AT  Cardiovascular: RRR, S1/S2, no rubs, no gallops  Respiratory: Bibasilar crackles, right foot to auscultation. No wheezes or rhonchi. Good air movement.  Abdomen: Soft/+BS, non tender, non distended, no guarding  Extremities: Bilateral above-knee amputations--no erythema, no drainage  Data Reviewed: Basic Metabolic Panel:  Lab 06/18/12 1607 06/17/12 1052  NA 137 140  K 4.1 3.3*  CL 98 96  CO2 27 30  GLUCOSE 230* 129*  BUN 17 20  CREATININE 3.70* 3.40*  CALCIUM 8.7 9.1  MG -- --  PHOS -- --   Liver Function Tests: No results found for this basename: AST:5,ALT:5,ALKPHOS:5,BILITOT:5,PROT:5,ALBUMIN:5 in the last 168 hours No results found for this basename: LIPASE:5,AMYLASE:5 in the last 168 hours No results found for this basename: AMMONIA:5 in the last 168 hours CBC:  Lab 06/18/12 0505 06/17/12 2336 06/17/12 2131 06/17/12 1052  WBC 11.6* -- -- 12.6*  NEUTROABS -- -- -- --  HGB 8.7* 8.6* 8.5* 5.2*  HCT 24.9* 25.8* 25.2* 15.5*  MCV 92.6 -- -- 96.3  PLT 195 -- -- 369   Cardiac Enzymes: No results found for this basename: CKTOTAL:5,CKMB:5,CKMBINDEX:5,TROPONINI:5 in the last 168 hours BNP: No components found with this basename: POCBNP:5 CBG:  Lab 06/18/12 0738 06/17/12 2315 06/17/12 2118 06/17/12 1739  GLUCAP 251* 308* 411* 197*    No results found for this or any  previous visit (from the past 240 hour(s)).   Scheduled Meds:   . amitriptyline  50 mg Oral QHS  . amLODipine  5 mg Oral Daily  . aspirin EC  81 mg Oral Daily  . calcium acetate  1,334 mg Oral TID WC  . gabapentin  100 mg Oral QHS  . heparin subcutaneous  5,000 Units Subcutaneous Q8H  . insulin aspart  0-9 Units Subcutaneous TID WC  . insulin aspart  10 Units Subcutaneous Once  . metoCLOPramide  10 mg Oral TID AC  . moxifloxacin  400 mg Intravenous Q24H  . multivitamin  1 tablet Oral QHS  . pantoprazole (PROTONIX) IV  40 mg Intravenous Q12H  . pneumococcal 23 valent vaccine  0.5 mL Intramuscular Tomorrow-1000  . sevelamer  800 mg Oral TID WC  . sodium chloride  3 mL Intravenous Q12H   Continuous Infusions:    Mary Crispo, DO  [facility] Pager [phone]  If 7PM-7AM, please contact night-coverage [email or web address] Password [email or web address] 06/18/2012, 8:47 AM   LOS: 1 day

## 2012-06-18 NOTE — Consult Note (Addendum)
Unassigned Consult  Reason for Consult: Anemia Referring Physician: [facility]  [name] HPI: This is a 41 year old female with multiple medial problems admitted for severe anemia.  Her HGB was noted to be in the 5 range.  No reports of hematochezia, melena, abdominal pain, nausea, vomiting, and she is heme negative.  In the past she was noted to have a low HGB, but no prior work up was pursued.  She denies having a prior EGD or colonoscopy in the past.  Past Medical History  Diagnosis Date  . GERD (gastroesophageal reflux disease)   . Diabetes mellitus     IDDM  . Hypertension     states has been on med. x "a long time"  . ESRF (end stage renal failure)     dialysis M,W, F; left thigh AV graft  . History of gangrene     left foot  . Hx MRSA infection   . 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 Av fistula placement 10/18/2008    right thigh AV graft  . Finger debridement 06/20/2010    right middle finger  . Finger amputation 05/27/2010    right middle  . Thrombectomy / arteriovenous graft revision 01/14/2010    left superficial femoral artery; left thigh AV graft placement  . 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 Leg amputation below knee 10/11/2009    right  . Central venous catheter insertion 08/06/2009    removal right IJ Diatek cath., placement left IJ Diatek cath.  . 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 Foot amputation 04/04/2009    left transtibial amputation  . Excision / curettage bone cyst talus / calcaneus 03/04/2009    partial calcaneal exc. left; placement wound VAC  . Groin debridement 11/16/2008    right rectus femoris muscle flap to right groin wound; right groing debridement  . Femoral endarterectomy 11/06/2008    right common femoral; embolectomy right femoral artery; removal right femoral AV graft  . Central venous catheter insertion 07/10/2008; 12/31/2006; 01/19/2006; 06/19/2005;  09/19/2004; 09/08/2004    right IJ Diatek catheter  . Av fistula repair 07/08/2008    removal left upper arm AV graft  . Thrombectomy / arteriovenous graft revision 05/11/2007;12/31/2006; 01/18/2006; 12/16/2005    left upper arm  . Dialysis fistula creation 01/20/2007    left upper arm AV graft  . Dialysis fistula creation 01/28/2006    right upper arm AV graft  . Thrombectomy / arteriovenous graft revision 10/26/2005    left forearm AV graft  . Dialysis fistula creation 10/04/2005    left forearm AV graft  . Av fistula repair 07/14/2005    removal infected right forearm AV graft  . Thrombectomy / arteriovenous graft revision 03/11/2005; 10/20/2004    right forearm AV graft  . Dialysis fistula creation 09/10/2004    right forearm AV graft  . Av fistula placement 07/18/2004    creation left AV fistula, radial to cephalic  . Finger exploration 07/13/2002    and repair left middle finger  . Carpal tunnel release 01/26/2012    Procedure: CARPAL TUNNEL RELEASE;  Surgeon: Nicki Reaper, MD;  Location: [facility];  Service: Orthopedics;  Laterality: Left;    Family History  Problem Relation Age of Onset  . Diabetes Mother   . Heart disease Mother   . Hypertension Mother   . Heart  attack Mother 50  . Diabetes Sister   . Hypertension Brother   . Heart attack Brother     Social History:  reports that she has never smoked. She has never used smokeless tobacco. She reports that she does not drink alcohol or use illicit drugs.  Allergies:  Allergies  Allergen Reactions  . Percocet (Oxycodone-Acetaminophen) Other (See Comments)    GI UPSET  . Soap Itching    IVORY SOAP    Medications:  Scheduled:   . amitriptyline  50 mg Oral QHS  . amLODipine  5 mg Oral Daily  . aspirin EC  81 mg Oral Daily  . calcium acetate  1,334 mg Oral TID WC  . gabapentin  100 mg Oral QHS  . heparin subcutaneous  5,000 Units Subcutaneous Q8H  . insulin aspart  0-9 Units Subcutaneous TID WC  . insulin  aspart  10 Units Subcutaneous Once  . metoCLOPramide  10 mg Oral TID AC  . moxifloxacin  400 mg Intravenous Q24H  . multivitamin  1 tablet Oral QHS  . pantoprazole (PROTONIX) IV  40 mg Intravenous Q12H  . pneumococcal 23 valent vaccine  0.5 mL Intramuscular Tomorrow-1000  . sevelamer  800 mg Oral TID WC  . sodium chloride  3 mL Intravenous Q12H   Continuous:   Results for orders placed during the hospital encounter of 06/17/12 (from the past 24 hour(s))  PREPARE RBC (CROSSMATCH)     Status: Normal   Collection Time   06/17/12  1:05 PM      Component Value Range   Order Confirmation ORDER PROCESSED BY BLOOD BANK    TYPE AND SCREEN     Status: Normal (Preliminary result)   Collection Time   06/17/12  1:05 PM      Component Value Range   ABO/RH(D) A POS     Antibody Screen NEG     Sample Expiration 06/20/2012     Unit Number [id number]     Blood Component Type RED CELLS,LR     Unit division 00     Status of Unit ISSUED     Transfusion Status OK TO TRANSFUSE     Crossmatch Result Compatible     Unit Number [id number]     Blood Component Type RED CELLS,LR     Unit division 00     Status of Unit ISSUED     Transfusion Status OK TO TRANSFUSE     Crossmatch Result Compatible     Unit Number [id number]     Blood Component Type RED CELLS,LR     Unit division 00     Status of Unit ISSUED     Transfusion Status OK TO TRANSFUSE     Crossmatch Result Compatible    OCCULT BLOOD, POC DEVICE     Status: Normal   Collection Time   06/17/12  1:54 PM      Component Value Range   Fecal Occult Bld NEGATIVE    VITAMIN B12     Status: Normal   Collection Time   06/17/12  1:56 PM      Component Value Range   Vitamin B-12 885  211 - 911 pg/mL  FOLATE     Status: Normal   Collection Time   06/17/12  1:56 PM      Component Value Range   Folate >20.0    IRON AND TIBC     Status: Abnormal   Collection Time   06/17/12  1:56 PM  Component Value Range   Iron 48  42 - 135  ug/dL   TIBC 161 (*) 096 - 045 ug/dL   Saturation Ratios 22  20 - 55 %   UIBC 170  125 - 400 ug/dL  FERRITIN     Status: Abnormal   Collection Time   06/17/12  1:56 PM      Component Value Range   Ferritin 1262 (*) 10 - 291 ng/mL  RETICULOCYTES     Status: Abnormal   Collection Time   06/17/12  1:56 PM      Component Value Range   Retic Ct Pct 18.0 (*) 0.4 - 3.1 %   RBC. 1.48 (*) 3.87 - 5.11 MIL/uL   Retic Count, Manual 266.4 (*) 19.0 - 186.0 K/uL  PREPARE RBC (CROSSMATCH)     Status: Normal   Collection Time   06/17/12  3:27 PM      Component Value Range   Order Confirmation ORDER PROCESSED BY BLOOD BANK    GLUCOSE, CAPILLARY     Status: Abnormal   Collection Time   06/17/12  5:39 PM      Component Value Range   Glucose-Capillary 197 (*) 70 - 99 mg/dL   Comment 1 Documented in Chart     Comment 2 Notify RN    GLUCOSE, CAPILLARY     Status: Abnormal   Collection Time   06/17/12  9:18 PM      Component Value Range   Glucose-Capillary 411 (*) 70 - 99 mg/dL  HEMOGLOBIN AND HEMATOCRIT, BLOOD     Status: Abnormal   Collection Time   06/17/12  9:31 PM      Component Value Range   Hemoglobin 8.5 (*) 12.0 - 15.0 g/dL   HCT 40.9 (*) 81.1 - 91.4 %  GLUCOSE, CAPILLARY     Status: Abnormal   Collection Time   06/17/12 11:15 PM      Component Value Range   Glucose-Capillary 308 (*) 70 - 99 mg/dL  HEMOGLOBIN AND HEMATOCRIT, BLOOD     Status: Abnormal   Collection Time   06/17/12 11:36 PM      Component Value Range   Hemoglobin 8.6 (*) 12.0 - 15.0 g/dL   HCT 78.2 (*) 95.6 - 21.3 %  BASIC METABOLIC PANEL     Status: Abnormal   Collection Time   06/18/12  5:05 AM      Component Value Range   Sodium 137  135 - 145 mEq/L   Potassium 4.1  3.5 - 5.1 mEq/L   Chloride 98  96 - 112 mEq/L   CO2 27  19 - 32 mEq/L   Glucose, Bld 230 (*) 70 - 99 mg/dL   BUN 17  6 - 23 mg/dL   Creatinine, Ser 0.86 (*) 0.50 - 1.10 mg/dL   Calcium 8.7  8.4 - 57.8 mg/dL   GFR calc non Af Amer 14 (*) >90  mL/min   GFR calc Af Amer 16 (*) >90 mL/min  CBC     Status: Abnormal   Collection Time   06/18/12  5:05 AM      Component Value Range   WBC 11.6 (*) 4.0 - 10.5 K/uL   RBC 2.69 (*) 3.87 - 5.11 MIL/uL   Hemoglobin 8.7 (*) 12.0 - 15.0 g/dL   HCT 46.9 (*) 62.9 - 52.8 %   MCV 92.6  78.0 - 100.0 fL   MCH 32.3  26.0 - 34.0 pg   MCHC 34.9  30.0 -  36.0 g/dL   RDW 16.1 (*) 09.6 - 04.5 %   Platelets 195  150 - 400 K/uL  PROTIME-INR     Status: Normal   Collection Time   06/18/12  5:05 AM      Component Value Range   Prothrombin Time 13.5  11.6 - 15.2 seconds   INR 1.04  0.00 - 1.49  GLUCOSE, CAPILLARY     Status: Abnormal   Collection Time   06/18/12  7:38 AM      Component Value Range   Glucose-Capillary 251 (*) 70 - 99 mg/dL   Comment 1 Documented in Chart    LACTATE DEHYDROGENASE     Status: Normal   Collection Time   06/18/12  9:54 AM      Component Value Range   LDH 236  94 - 250 U/L  GLUCOSE, CAPILLARY     Status: Abnormal   Collection Time   06/18/12 11:25 AM      Component Value Range   Glucose-Capillary 259 (*) 70 - 99 mg/dL   Comment 1 Documented in Chart       Dg Chest Port 1 View  06/17/2012  *RADIOLOGY REPORT*  Clinical Data: Shortness of breath.  Productive cough.  PORTABLE CHEST - 1 VIEW  Comparison: 04/25/2012  Findings: Mild cardiomegaly.  Bibasilar atelectasis or infiltrates, unchanged on the left, increased on the right.  No effusions or edema.  No acute bony abnormality.  IMPRESSION: Bibasilar airspace opacities, increasing on the right.  These could represent atelectasis or pneumonia.   Original Report Authenticated By: Cyndie Chime, M.D.     ROS:  As stated above in the HPI otherwise negative.  Blood pressure 145/79, pulse 85, temperature 98.4 F (36.9 C), temperature source Oral, resp. rate 18, height 5\' 3"  (1.6 m), weight 83.9 kg (184 lb 15.5 oz), SpO2 100.00%.    PE: Gen: NAD, Alert and Oriented HEENT:  [place]/AT, EOMI Neck: Supple, no LAD Lungs: CTA  Bilaterally CV: RRR without M/G/R ABM: Soft, NTND, +BS Ext: No C/C/E  Assessment/Plan: 1) Severe anemia. 2) ESRD 3) MRSA. 4) DM. 5) Obesity.   The patient most likely has anemia of chronic disease, but it would be worthwhile to perform an EGD/Colonoscopy for further evaluation.  Her HGB baseline this year appears to be in the 8-10 range.  It steadily dropped from the 12-13 range over the past couple of years.  I brought up the issue of the procedures and she flatly refuses the procedures as she wants to go home.  She states that she has other appointments to attend and is not willing to stay at this time for the procedures.  Plan: 1) Signing off.  Patient refuses procedures.  Avalynne Diver D 06/18/2012, 12:40 PM    The patient changed her mind.  She wants to have the procedures.  I will prep her for tomorrow.

## 2012-06-18 NOTE — Progress Notes (Signed)
I have seen and examined this patient and agree with the plan of care. Improved. Patient reports bleeding form Graft although Heme neg stools rules out  GI blood loss. Retroperitoneal bleeding is possible but clinically no evidence. A CT abdomen may be helpful.  Allison Deshotels W 06/18/2012, 10:44 AM

## 2012-06-18 NOTE — ED Provider Notes (Signed)
History     CSN: [id number]  Arrival date & time 06/17/12  1002   First MD Initiated Contact with Patient 06/17/12 1008      Chief Complaint  Patient presents with  . Anemia    (Consider location/radiation/quality/duration/timing/severity/associated sxs/prior treatment) HPI The patient presents to the ER with stump pain and anemia from Dialysis. The patient denies chest pain, shortness of breath, rectal bleeding, headache, visual changes, weakness, syncope, blurred vision, or vomiting.The patient states that she has not had any graft bleeding. The patient states that she was recently in the ER for her blood sugars. Past Medical History  Diagnosis Date  . GERD (gastroesophageal reflux disease)   . Diabetes mellitus     IDDM  . Hypertension     states has been on med. x "a long time"  . ESRF (end stage renal failure)     dialysis M,W, F; left thigh AV graft  . History of gangrene     left foot  . Hx MRSA infection   . 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 Av fistula placement 10/18/2008    right thigh AV graft  . Finger debridement 06/20/2010    right middle finger  . Finger amputation 05/27/2010    right middle  . Thrombectomy / arteriovenous graft revision 01/14/2010    left superficial femoral artery; left thigh AV graft placement  . 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 Leg amputation below knee 10/11/2009    right  . Central venous catheter insertion 08/06/2009    removal right IJ Diatek cath., placement left IJ Diatek cath.  . 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 Foot amputation 04/04/2009    left transtibial amputation  . Excision / curettage bone cyst talus / calcaneus 03/04/2009    partial calcaneal exc. left; placement wound VAC  . Groin debridement 11/16/2008    right rectus femoris muscle flap to right groin wound; right groing debridement  . Femoral endarterectomy 11/06/2008    right common  femoral; embolectomy right femoral artery; removal right femoral AV graft  . Central venous catheter insertion 07/10/2008; 12/31/2006; 01/19/2006; 06/19/2005; 09/19/2004; 09/08/2004    right IJ Diatek catheter  . Av fistula repair 07/08/2008    removal left upper arm AV graft  . Thrombectomy / arteriovenous graft revision 05/11/2007;12/31/2006; 01/18/2006; 12/16/2005    left upper arm  . Dialysis fistula creation 01/20/2007    left upper arm AV graft  . Dialysis fistula creation 01/28/2006    right upper arm AV graft  . Thrombectomy / arteriovenous graft revision 10/26/2005    left forearm AV graft  . Dialysis fistula creation 10/04/2005    left forearm AV graft  . Av fistula repair 07/14/2005    removal infected right forearm AV graft  . Thrombectomy / arteriovenous graft revision 03/11/2005; 10/20/2004    right forearm AV graft  . Dialysis fistula creation 09/10/2004    right forearm AV graft  . Av fistula placement 07/18/2004    creation left AV fistula, radial to cephalic  . Finger exploration 07/13/2002    and repair left middle finger  . Carpal tunnel release 01/26/2012    Procedure: CARPAL TUNNEL RELEASE;  Surgeon: Nicki Reaper, MD;  Location: [facility];  Service: Orthopedics;  Laterality: Left;    Family History  Problem Relation Age of Onset  . Diabetes Mother   .  Heart disease Mother   . Hypertension Mother   . Heart attack Mother 62  . Diabetes Sister   . Hypertension Brother   . Heart attack Brother     History  Substance Use Topics  . Smoking status: Never Smoker   . Smokeless tobacco: Never Used  . Alcohol Use: No    OB History    Grav Para Term Preterm Abortions TAB SAB Ect Mult Living                  Review of Systems All other systems negative except as documented in the HPI. All pertinent positives and negatives as reviewed in the HPI.  Allergies  Percocet and Soap  Home Medications  No current outpatient prescriptions on file.  BP 154/91   Pulse 92  Temp 97.8 F (36.6 C) (Oral)  Resp 20  Ht 5\' 3"  (1.6 m)  Wt 184 lb 15.5 oz (83.9 kg)  BMI 32.77 kg/m2  SpO2 93%  Physical Exam  Nursing note and vitals reviewed. Constitutional: She is oriented to person, place, and time. She appears well-developed and well-nourished. No distress.  HENT:  Head: Normocephalic and atraumatic.  Mouth/Throat: Oropharynx is clear and moist. No oropharyngeal exudate.  Eyes: Pupils are equal, round, and reactive to light.  Neck: Normal range of motion. Neck supple.  Cardiovascular: Normal rate, regular rhythm and normal heart sounds.  Exam reveals no gallop and no friction rub.   No murmur heard. Pulmonary/Chest: Effort normal and breath sounds normal. No respiratory distress.  Neurological: She is alert and oriented to person, place, and time.  Skin: Skin is warm and dry. No rash noted.    ED Course  Procedures (including critical care time)  Labs Reviewed  CBC - Abnormal; Notable for the following:    WBC 12.6 (*)     RBC 1.61 (*)     Hemoglobin 5.2 (*)     HCT 15.5 (*)     RDW 18.1 (*)     All other components within normal limits  BASIC METABOLIC PANEL - Abnormal; Notable for the following:    Potassium 3.3 (*)     Glucose, Bld 129 (*)     Creatinine, Ser 3.40 (*)     GFR calc non Af Amer 16 (*)     GFR calc Af Amer 18 (*)     All other components within normal limits  IRON AND TIBC - Abnormal; Notable for the following:    TIBC 218 (*)     All other components within normal limits  FERRITIN - Abnormal; Notable for the following:    Ferritin 1262 (*)     All other components within normal limits  RETICULOCYTES - Abnormal; Notable for the following:    Retic Ct Pct 18.0 (*)     RBC. 1.48 (*)     Retic Count, Manual 266.4 (*)     All other components within normal limits  GLUCOSE, CAPILLARY - Abnormal; Notable for the following:    Glucose-Capillary 197 (*)     All other components within normal limits  HEMOGLOBIN AND  HEMATOCRIT, BLOOD - Abnormal; Notable for the following:    Hemoglobin 8.5 (*)  POST TRANSFUSION SPECIMEN   HCT 25.2 (*)     All other components within normal limits  HEMOGLOBIN AND HEMATOCRIT, BLOOD - Abnormal; Notable for the following:    Hemoglobin 8.6 (*)     HCT 25.8 (*)     All other components within  normal limits  GLUCOSE, CAPILLARY - Abnormal; Notable for the following:    Glucose-Capillary 411 (*)     All other components within normal limits  GLUCOSE, CAPILLARY - Abnormal; Notable for the following:    Glucose-Capillary 308 (*)     All other components within normal limits  PREPARE RBC (CROSSMATCH)  TYPE AND SCREEN  VITAMIN B12  FOLATE  OCCULT BLOOD, POC DEVICE  PREPARE RBC (CROSSMATCH)  CULTURE, BLOOD (ROUTINE X 2)  CULTURE, BLOOD (ROUTINE X 2)  BASIC METABOLIC PANEL  CBC  HEMOGLOBIN A1C  PROTIME-INR  CULTURE, EXPECTORATED SPUTUM-ASSESSMENT   Dg Chest Port 1 View  06/17/2012  *RADIOLOGY REPORT*  Clinical Data: Shortness of breath.  Productive cough.  PORTABLE CHEST - 1 VIEW  Comparison: 04/25/2012  Findings: Mild cardiomegaly.  Bibasilar atelectasis or infiltrates, unchanged on the left, increased on the right.  No effusions or edema.  No acute bony abnormality.  IMPRESSION: Bibasilar airspace opacities, increasing on the right.  These could represent atelectasis or pneumonia.   Original Report Authenticated By: Cyndie Chime, M.D.      1. Anemia   2. Arteriovenous graft infection   3. Bacteremia   4. Diabetes mellitus   5. ESRD (end stage renal disease) on dialysis   6. Leucocytosis    I spoke with Dr. Hyman Hopes from Nephrology, and the [facility]. The patient will need admission for her low Hgb. The patient has been stable here in the ER.    MDM  MDM Reviewed: vitals and nursing note Interpretation: labs Consults: admitting MD            Carlyle Dolly, PA-C 06/18/12 [phone]

## 2012-06-18 NOTE — Progress Notes (Signed)
Patient says she can't drink the golytely prep.She was only able to drink 2 cups.Explained to patient why she needs to drink the prep so that her colon will be cleaned.M Lynch,NP notified,order received to place NGT if patient agrees.MD also notified that patient is getting heparin SQ for VTE prophylaxis.Order received to hold tonight's and am dose.Will continue to monitor. Kenneth Cuaresma [name],RN

## 2012-06-19 ENCOUNTER — Encounter (HOSPITAL_COMMUNITY)
Admission: EM | Disposition: A | Discharge status: Home / Self Care | Payer: Self-pay | Source: Home / Self Care | Attending: Internal Medicine

## 2012-06-19 LAB — CBC
Hemoglobin: 8.2 g/dL — ABNORMAL LOW (ref 12.0–15.0)
MCH: 30.8 pg (ref 26.0–34.0)
MCHC: 33.3 g/dL (ref 30.0–36.0)
RDW: 17.7 % — ABNORMAL HIGH (ref 11.5–15.5)

## 2012-06-19 LAB — GLUCOSE, CAPILLARY: Glucose-Capillary: 254 mg/dL — ABNORMAL HIGH (ref 70–99)

## 2012-06-19 SURGERY — COLONOSCOPY WITH ESOPHAGOGASTRODUODENOSCOPY (EGD)
Anesthesia: Moderate Sedation | Laterality: Left

## 2012-06-19 MED ORDER — DARBEPOETIN ALFA-POLYSORBATE 100 MCG/0.5ML IJ SOLN: 100.0000 ug | INTRAMUSCULAR | Status: DC

## 2012-06-19 MED ORDER — HEPARIN SODIUM (PORCINE) 1000 UNIT/ML DIALYSIS
20.0000 [IU]/kg | INTRAMUSCULAR | Status: DC | PRN
  Filled 2012-06-19: qty 2

## 2012-06-19 MED ORDER — MOXIFLOXACIN HCL 400 MG PO TABS: 400.0000 mg | ORAL_TABLET | Freq: Every day | ORAL | Status: DC

## 2012-06-19 NOTE — Progress Notes (Signed)
Pt received copy of discharge instructions, pt verbalizes understanding of instructions and signs and symptoms to follow up with MD for, R EJ PIV d/c'd, vaseline gauze and dry drsg applied, no drainage noted on dressing, telemetry d/c'd, pt awaiting transportation to discharge area

## 2012-06-19 NOTE — Discharge Summary (Signed)
Physician Discharge Summary  [name] ZOX:[id number] DOB: [birth year] DOA: 06/17/2012  PCP: Lieutenant Diego, MD  Admit date: 06/17/2012 Discharge date: 06/19/2012 Recommendations for Outpatient Follow-up:  1. Pt will need to follow up with PCP in 1 weeks post discharge 2. Follow up with Dr. Elnoria Howard Discharge Diagnoses:   Severe anemia  -Unable to perform endoscopy due to the patient's intolerance of contrast -Will need to follow up with outpatient GI appointment for future endoscopy -Patient is asymptomatic, hemodynamically stable  -Suspect may be due to anemia of CKD  -Check LDH 236, haptoglobin pending at the time of discharge but doubt hemolysis given stability of hemoglobin at the time of discharge  -B12 85, folate greater than 20, iron saturation 22%, reticulocyte 18%  -Appreciate Dr. Evelina Dun followup--no active bleed from graft site  -Consulted and discussed with Dr. Jeani Hawking   ESRD  -renal followed for HD  Diabetes mellitus type 2  -Restart home dose of 70/30 insulin, 8 units a.m., 6 units p.m.  -Continue NovoLog sliding scale  Leukocytosis  -Some improvement today  -Chest x-ray with bibasilar opacities/atelectasis  -Continue empiric moxifloxacin-5 more days after discharge - blood cultures negative at the time of discharge    Discharge Condition: Stable  Disposition: Discharge home   Diet:renal Wt Readings from Last 3 Encounters:  06/17/12 83.9 kg (184 lb 15.5 oz)  06/17/12 83.9 kg (184 lb 15.5 oz)  04/29/12 83.9 kg (184 lb 15.5 oz)    History of present illness:   41 y.o. female, with H/O ESRD M,W,F dialysis, bilateral AKA, poor dialysis access with last remaninig site being present L.fem Graft, DM-2, recent graft infection and bleed finished antibiotics under the care of Dr Synthia Innocent for Acinetobacter Baumanii, comes in from Dialysis center where blood work showed Hb <5 . Patient denies any bleed from graft site, no dark stool or blood in stool, no  chest-abd pain, no SOB, mild dry cough, no other subjective complains.  Hospital Course:  The patient was transfused 3 units PRBCs on dialysis. The patient remained hemodynamically stable and asymptomatic. Dr. Leonette Most fields was asked to see the patient regarding her femoral graft due to history of bleeding from the site. He did not feel that graft was the source of the patient's current acute anemia. Dr. Jeani Hawking, gastroenterology, was consulted. Fecal occult blood test was negative. INR was 1.04. The patient initially refused endoscopy. However after a few hours, the patient consented. EGD and colonoscopy were performed on 06/19/2012. Unfortunately, the patient cannot tolerate the prep for endoscopy. An NG tube was attempted, but she was not able to tolerate insertion. As a result, endoscopy was canceled. She will need followup with the gastroenterologist as an outpatient for future endoscopy. I discussed with the nephrology team, and they will follow her serial hemoglobins. The patient's 70/30 insulin was held do to her n.p.o. status for endoscopy. She was instructed to resume her home doses at discharge. The patient was started on moxifloxacin empirically for leukocytosis. Plan for 5 additional days for treatment of bronchitis/community-acquired pneumonia. The leukocytosis did improve. The patient remained afebrile and hemodynamically stable. Blood cultures were drawn and are negative at the date of discharge. The patient was maintained on her home dose of amlodipine. Her blood pressure remained stable throughout the hospitalization.   Consultants: Dr. Jeani Hawking, GI  Dr. Elvis Coil, Renal   Discharge Exam: Filed Vitals:   06/19/12 1005  BP: 136/87  Pulse: 95  Temp: 98.4 F (36.9 C)  Resp: 18  Filed Vitals:   06/18/12 1704 06/18/12 2120 06/19/12 0438 06/19/12 1005  BP: 117/66 121/68 120/69 136/87  Pulse: 96 96 101 95  Temp: 98.7 F (37.1 C) 98.8 F (37.1 C) 98.1 F (36.7 C)  98.4 F (36.9 C)  TempSrc: Oral Oral Oral Oral  Resp: 18 20 18 18   Height:      Weight:      SpO2: 92% 94% 90% 93%   General: A&O x 3, NAD, pleasant, cooperative Cardiovascular: RRR, no rub, no gallop, no S3 Respiratory: CTAB, no wheeze, no rhonchi Abdomen:soft, nontender, nondistended, positive bowel sounds Extremities: Bilateral above-the-knee amputations. Left leg graft site without erythema, bleeding, fluctuance or tenderness.  Discharge Instructions      Discharge Orders    Future Appointments: Provider: Department: Dept Phone: Center:   09/06/2012 2:30 PM Randall Hiss, MD [facility] [phone] [facility]     Future Orders Please Complete By Expires   Diet - low sodium heart healthy      Increase activity slowly      Discharge instructions      Comments:   Take moxifloxacin (antibiotic) one pill daily for 5 more days.  Pick up at pharmacy       Medication List     As of 06/19/2012  1:57 PM    STOP taking these medications         ibuprofen 200 MG tablet   Commonly known as: ADVIL,MOTRIN      TAKE these medications         amitriptyline 50 MG tablet   Commonly known as: ELAVIL   Take 50 mg by mouth at bedtime.      amLODipine 10 MG tablet   Commonly known as: NORVASC   Take 5 mg by mouth daily.      aspirin EC 81 MG tablet   Take 81 mg by mouth daily.      calcium acetate 667 MG capsule   Commonly known as: PHOSLO   Take 1,334 mg by mouth 3 (three) times daily with meals.      gabapentin 100 MG capsule   Commonly known as: NEURONTIN   Take 100 mg by mouth at bedtime.      hydrOXYzine 25 MG tablet   Commonly known as: ATARAX/VISTARIL   Take 25 mg by mouth 3 (three) times daily as needed. For itching      insulin aspart protamine-insulin aspart (70-30) 100 UNIT/ML injection   Commonly known as: NOVOLOG 70/30   Inject 6-8 Units into the skin 2 (two) times daily with a meal. Take 8 units in the morning and 6 units in the evening       metoCLOPramide 10 MG tablet   Commonly known as: REGLAN   Take 10 mg by mouth 3 (three) times daily as needed. For stomach upset      moxifloxacin 400 MG tablet   Commonly known as: AVELOX   Take 1 tablet (400 mg total) by mouth daily.      multivitamin Tabs tablet   Take 1 tablet by mouth at bedtime.      RENVELA 800 MG tablet   Generic drug: sevelamer   Take 800 mg by mouth 3 (three) times daily with meals.          The results of significant diagnostics from this hospitalization (including imaging, microbiology, ancillary and laboratory) are listed below for reference.    Significant Diagnostic Studies: Dg Chest Port 1 View  06/17/2012  *  RADIOLOGY REPORT*  Clinical Data: Shortness of breath.  Productive cough.  PORTABLE CHEST - 1 VIEW  Comparison: 04/25/2012  Findings: Mild cardiomegaly.  Bibasilar atelectasis or infiltrates, unchanged on the left, increased on the right.  No effusions or edema.  No acute bony abnormality.  IMPRESSION: Bibasilar airspace opacities, increasing on the right.  These could represent atelectasis or pneumonia.   Original Report Authenticated By: Cyndie Chime, M.D.      Microbiology: Recent Results (from the past 240 hour(s))  CULTURE, BLOOD (ROUTINE X 2)     Status: Normal (Preliminary result)   Collection Time   06/17/12  6:00 PM      Component Value Range Status Comment   Specimen Description BLOOD HAND RIGHT   Final    Special Requests BOTTLES DRAWN AEROBIC ONLY 5CC   Final    Culture  Setup Time 06/18/2012 01:38   Final    Culture     Final    Value:        BLOOD CULTURE RECEIVED NO GROWTH TO DATE CULTURE WILL BE HELD FOR 5 DAYS BEFORE ISSUING A FINAL NEGATIVE REPORT   Report Status PENDING   Incomplete      Labs: Basic Metabolic Panel:  Lab 06/18/12 1610 06/17/12 1052  NA 137 140  K 4.1 3.3*  CL 98 96  CO2 27 30  GLUCOSE 230* 129*  BUN 17 20  CREATININE 3.70* 3.40*  CALCIUM 8.7 9.1  MG -- --  PHOS -- --   Liver Function  Tests: No results found for this basename: AST:5,ALT:5,ALKPHOS:5,BILITOT:5,PROT:5,ALBUMIN:5 in the last 168 hours No results found for this basename: LIPASE:5,AMYLASE:5 in the last 168 hours No results found for this basename: AMMONIA:5 in the last 168 hours CBC:  Lab 06/19/12 0500 06/18/12 0505 06/17/12 2336 06/17/12 2131 06/17/12 1052  WBC 8.4 11.6* -- -- 12.6*  NEUTROABS -- -- -- -- --  HGB 8.2* 8.7* 8.6* 8.5* 5.2*  HCT 24.6* 24.9* 25.8* 25.2* 15.5*  MCV 92.5 92.6 -- -- 96.3  PLT 260 195 -- -- 369   Cardiac Enzymes: No results found for this basename: CKTOTAL:5,CKMB:5,CKMBINDEX:5,TROPONINI:5 in the last 168 hours BNP: No components found with this basename: POCBNP:5 CBG:  Lab 06/19/12 1219 06/19/12 0808 06/18/12 2134 06/18/12 1702 06/18/12 1125  GLUCAP 254* 296* 276* 278* 259*    Time coordinating discharge:  Greater than 30 minutes  Signed:  Solomiya Pascale, DO [facility] Pager: [phone] 06/19/2012, 1:57 PM

## 2012-06-19 NOTE — Progress Notes (Addendum)
The patient only drank four cups of the prep.  I cancelled the procedures.  Signing off on her care as she does not appear to be serious about undergoing further evaluation.  ADDENDUM:  I just saw the patient to confirm if she only drank four cups.  It appears that she consumed 50% of the prep in total.  She reports trying and an NG tube was attempted, but she was not able to tolerate the tube.  I offered to prep her with Miralax and reschedule her for the procedures, but she is only interested in going home.

## 2012-06-19 NOTE — Progress Notes (Signed)
I have seen and examined this patient and agree with the plan of care . Wants to go home. Stable from renal standpoint  Tequita Marrs W 06/19/2012, 10:27 AM

## 2012-06-19 NOTE — Progress Notes (Signed)
[facility] Progress Note  Subjective: I can't drink any more of that stuff.  (drank 1/2 container).  Can't tell me if stools have changed to clear. Wants to go home today. Objective Filed Vitals:   06/18/12 1428 06/18/12 1704 06/18/12 2120 06/19/12 0438  BP: 137/84 117/66 121/68 120/69  Pulse: 102 96 96 101  Temp: 99 F (37.2 C) 98.7 F (37.1 C) 98.8 F (37.1 C) 98.1 F (36.7 C)  TempSrc: Oral Oral Oral Oral  Resp: 18 18 20 18   Height:      Weight:      SpO2: 100% 92% 94% 90%   Physical Exam General: Sleeping - rouses easily- apparently above sats on room air (not wearing O2) Heart: tach rate ~100-110 regular Lungs: without wheezes or rales Abdomen: obese soft Extremities: bilateral AKA no edema Dialysis Access: left thigh graft patent  Dialysis Orders: Center: [facility] on MWF  EDW 81 HD Bath 2K 2.25 Ca Time 4 Heparin 7000. Access left thigh graft BFR 400 DFR 800  Zemplar 6 mcg IV/HD Epogen 13,400 Units IV/HD Venofer 100 /wed   Assessment/Plan:  1. Profound anemia - etiology unclear; heme neg; last Hgb 9.4 on10/9; hgb 5.1 10/16 and 5.2 10/18- at least stable x 2 days-; Epo dose decreased 10/9 would not have caused such a large drop in Hgb. Heme neg with no back or abdominal pain; She thinks the only possible blood loss is related to prolonged graft bleeding, though that really can't explain a 4 gm drop. Folate and B12 adequate. Fe studies not that bad. Ferritin increased 1200, but would continue IV Fe to help increase Hgb. Hgb now stable in the 8s. Initially refused colonoscopy, but agreed.  Hopefully prep will be adequate.. Will increase Epo back to 18,000 at discharge  Dose Aranesp  Monday at 100 if still here. 2. ESRD - MWF - HD orders written for Monday first round, if not d/c; change to tight heparin at discharge 3. Hypertension/volume - on norvasc. Volume up some 10/18 and has not been weighed since. Should be able to remove at next HD treatment now that Hgb  up. 4. Type 2 DM - per primary  5. Metabolic bone disease - already received zemplar today 6. Nutrition - high protein renal diet + prostat when orders released       7.   Hx acinetobacter bacteremia - with replacement of part of graft ; BC ordered at admission, but not listed as pending so             not clear if done.        8. Leukocytosis with cough- improved, afebrile - empiric avelox; equivocal cxr for pna;    Sheffield Slider, PA-C [facility] Beeper [phone]  06/19/2012,8:16 AM  LOS: 2 days    Additional Objective Labs: Basic Metabolic Panel:  Lab 06/18/12 0981 06/17/12 1052  NA 137 140  K 4.1 3.3*  CL 98 96  CO2 27 30  GLUCOSE 230* 129*  BUN 17 20  CREATININE 3.70* 3.40*  CALCIUM 8.7 9.1  ALB -- --  PHOS -- --  CBC:  Lab 06/19/12 0500 06/18/12 0505 06/17/12 2336 06/17/12 1052  WBC 8.4 11.6* -- 12.6*  NEUTROABS -- -- -- --  HGB 8.2* 8.7* 8.6* --  HCT 24.6* 24.9* 25.8* --  MCV 92.5 92.6 -- 96.3  PLT 260 195 -- 369  CBG:  Lab 06/19/12 0808 06/18/12 2134 06/18/12 1702 06/18/12 1125 06/18/12 0738  GLUCAP 296*  276* 278* 259* 251*   Iron Studies:  Basename 06/17/12 1356  IRON 48  TIBC 218*  TRANSFERRIN --  FERRITIN 1262*   @lablastinr3 @ Studies/Results: Dg Chest Port 1 View  06/17/2012  *RADIOLOGY REPORT*  Clinical Data: Shortness of breath.  Productive cough.  PORTABLE CHEST - 1 VIEW  Comparison: 04/25/2012  Findings: Mild cardiomegaly.  Bibasilar atelectasis or infiltrates, unchanged on the left, increased on the right.  No effusions or edema.  No acute bony abnormality.  IMPRESSION: Bibasilar airspace opacities, increasing on the right.  These could represent atelectasis or pneumonia.   Original Report Authenticated By: Cyndie Chime, M.D.    Medications:    . sodium chloride 1,000 mL (06/18/12 2129)      . amitriptyline  50 mg Oral QHS  . amLODipine  5 mg Oral Daily  . aspirin EC  81 mg Oral Daily  . calcium acetate  1,334 mg  Oral TID WC  . gabapentin  100 mg Oral QHS  . heparin subcutaneous  5,000 Units Subcutaneous Q8H  . insulin aspart  0-9 Units Subcutaneous TID WC  . metoCLOPramide  10 mg Oral TID AC  . moxifloxacin  400 mg Intravenous Q24H  . multivitamin  1 tablet Oral QHS  . pantoprazole (PROTONIX) IV  40 mg Intravenous Q12H  . pneumococcal 23 valent vaccine  0.5 mL Intramuscular Tomorrow-1000  . polyethylene glycol-electrolytes  4,000 mL Oral Once  . sevelamer  800 mg Oral TID WC  . sodium chloride  3 mL Intravenous Q12H

## 2012-06-20 LAB — POCT I-STAT, CHEM 8
BUN: 22 mg/dL (ref 6–23)
Chloride: 96 mEq/L (ref 96–112)
Creatinine, Ser: 4.1 mg/dL — ABNORMAL HIGH (ref 0.50–1.10)
Sodium: 138 mEq/L (ref 135–145)

## 2012-06-21 ENCOUNTER — Ambulatory Visit: Payer: Medicare Other | Admitting: Vascular Surgery

## 2012-06-22 ENCOUNTER — Emergency Department (HOSPITAL_COMMUNITY): Payer: Medicare Other

## 2012-06-22 ENCOUNTER — Emergency Department (HOSPITAL_COMMUNITY)
Admission: EM | Admit: 2012-06-22 | Discharge: 2012-06-22 | Disposition: A | Discharge status: Home / Self Care | Payer: Medicare Other | Source: Home / Self Care | Attending: Emergency Medicine | Admitting: Emergency Medicine

## 2012-06-22 ENCOUNTER — Encounter (HOSPITAL_COMMUNITY): Payer: Self-pay | Admitting: Emergency Medicine

## 2012-06-22 ENCOUNTER — Emergency Department (HOSPITAL_COMMUNITY)
Admission: EM | Admit: 2012-06-22 | Discharge: 2012-06-23 | Discharge status: Against Medical Advice | Payer: Medicare Other | Attending: Emergency Medicine | Admitting: Emergency Medicine

## 2012-06-22 DIAGNOSIS — R000 Tachycardia, unspecified: Secondary | ICD-10-CM | POA: Insufficient documentation

## 2012-06-22 DIAGNOSIS — Z794 Long term (current) use of insulin: Secondary | ICD-10-CM | POA: Insufficient documentation

## 2012-06-22 DIAGNOSIS — Z79899 Other long term (current) drug therapy: Secondary | ICD-10-CM | POA: Insufficient documentation

## 2012-06-22 DIAGNOSIS — I120 Hypertensive chronic kidney disease with stage 5 chronic kidney disease or end stage renal disease: Secondary | ICD-10-CM | POA: Insufficient documentation

## 2012-06-22 DIAGNOSIS — Z7982 Long term (current) use of aspirin: Secondary | ICD-10-CM | POA: Insufficient documentation

## 2012-06-22 DIAGNOSIS — E872 Acidosis, unspecified: Secondary | ICD-10-CM | POA: Insufficient documentation

## 2012-06-22 DIAGNOSIS — Z8614 Personal history of Methicillin resistant Staphylococcus aureus infection: Secondary | ICD-10-CM | POA: Insufficient documentation

## 2012-06-22 DIAGNOSIS — Z8719 Personal history of other diseases of the digestive system: Secondary | ICD-10-CM | POA: Insufficient documentation

## 2012-06-22 DIAGNOSIS — E119 Type 2 diabetes mellitus without complications: Secondary | ICD-10-CM | POA: Insufficient documentation

## 2012-06-22 DIAGNOSIS — N186 End stage renal disease: Secondary | ICD-10-CM | POA: Insufficient documentation

## 2012-06-22 DIAGNOSIS — R112 Nausea with vomiting, unspecified: Secondary | ICD-10-CM | POA: Insufficient documentation

## 2012-06-22 DIAGNOSIS — Z872 Personal history of diseases of the skin and subcutaneous tissue: Secondary | ICD-10-CM | POA: Insufficient documentation

## 2012-06-22 DIAGNOSIS — R1013 Epigastric pain: Secondary | ICD-10-CM | POA: Insufficient documentation

## 2012-06-22 LAB — CBC WITH DIFFERENTIAL/PLATELET
Eosinophils Relative: 0 % (ref 0–5)
HCT: 27.6 % — ABNORMAL LOW (ref 36.0–46.0)
Lymphocytes Relative: 16 % (ref 12–46)
Lymphs Abs: 2.8 10*3/uL (ref 0.7–4.0)
MCV: 93.2 fL (ref 78.0–100.0)
Monocytes Absolute: 2 10*3/uL — ABNORMAL HIGH (ref 0.1–1.0)
Monocytes Relative: 12 % (ref 3–12)
RBC: 2.96 MIL/uL — ABNORMAL LOW (ref 3.87–5.11)
WBC: 16.8 10*3/uL — ABNORMAL HIGH (ref 4.0–10.5)

## 2012-06-22 LAB — COMPREHENSIVE METABOLIC PANEL
ALT: 14 U/L (ref 0–35)
BUN: 14 mg/dL (ref 6–23)
CO2: 29 mEq/L (ref 19–32)
Calcium: 9 mg/dL (ref 8.4–10.5)
Creatinine, Ser: 4.26 mg/dL — ABNORMAL HIGH (ref 0.50–1.10)
GFR calc Af Amer: 14 mL/min — ABNORMAL LOW (ref >90)
GFR calc non Af Amer: 12 mL/min — ABNORMAL LOW (ref >90)
Glucose, Bld: 422 mg/dL — ABNORMAL HIGH (ref 70–99)

## 2012-06-22 LAB — GLUCOSE, CAPILLARY

## 2012-06-22 LAB — LIPASE, BLOOD: Lipase: 42 U/L (ref 11–59)

## 2012-06-22 MED ORDER — HYDROMORPHONE HCL PF 1 MG/ML IJ SOLN
1.0000 mg | Freq: Once | INTRAMUSCULAR | Status: DC
  Filled 2012-06-22: qty 1

## 2012-06-22 MED ORDER — ONDANSETRON 4 MG PO TBDP
ORAL_TABLET | ORAL | Status: AC
  Administered 2012-06-22: 8 mg
  Filled 2012-06-22: qty 2

## 2012-06-22 MED ORDER — HYDROMORPHONE HCL PF 1 MG/ML IJ SOLN
1.0000 mg | Freq: Once | INTRAMUSCULAR | Status: AC
  Administered 2012-06-22: 1 mg via INTRAVENOUS

## 2012-06-22 MED ORDER — METOCLOPRAMIDE HCL 5 MG/ML IJ SOLN
10.0000 mg | Freq: Once | INTRAMUSCULAR | Status: AC
  Administered 2012-06-22: 10 mg via INTRAVENOUS
  Filled 2012-06-22: qty 2

## 2012-06-22 MED ORDER — INSULIN ASPART 100 UNIT/ML ~~LOC~~ SOLN
6.0000 [IU] | Freq: Once | SUBCUTANEOUS | Status: AC
  Administered 2012-06-22: 6 [IU] via INTRAVENOUS
  Filled 2012-06-22: qty 1

## 2012-06-22 NOTE — ED Provider Notes (Signed)
History     CSN: [id number]  Arrival date & time 06/22/12  9562   First MD Initiated Contact with Patient 06/22/12 2101      Chief Complaint  Patient presents with  . Emesis    HPI  41 y.o. female, with H/O ESRD M,W,F dialysis, bilateral AKA, poor dialysis access with last remaninig site being present L.fem Graft, DM-2, presents after developing epigastric pain after multiple bouts of emesis. Patient states approximately 30 minutes to an hour after finishing dialysis while at home developed nausea and vomiting. She subsequently developed epigastric abdominal pain. She denies any chest pain, shortness of breath, palpitations, headache, dysuria, vaginal discharge, diarrhea, constipation. States she's had similar episodes in the past. She has been told in the past abdominal pain was gastroparesis but after chart review I have been unable to identify when this was mentioned. She denies any fevers, cough or congestion. Patient states the emesis is white in color. During interview and exam patient had multiple bouts of "emesis". The patient was spitting up white sputum. No heaves during these episodes.   Past Medical History  Diagnosis Date  . GERD (gastroesophageal reflux disease)   . Diabetes mellitus     IDDM  . Hypertension     states has been on med. x "a long time"  . ESRF (end stage renal failure)     dialysis M,W, F; left thigh AV graft  . History of gangrene     left foot  . Hx MRSA infection   . 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 Av fistula placement 10/18/2008    right thigh AV graft  . Finger debridement 06/20/2010    right middle finger  . Finger amputation 05/27/2010    right middle  . Thrombectomy / arteriovenous graft revision 01/14/2010    left superficial femoral artery; left thigh AV graft placement  . 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 Leg amputation below knee 10/11/2009    right  . Central  venous catheter insertion 08/06/2009    removal right IJ Diatek cath., placement left IJ Diatek cath.  . 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 Foot amputation 04/04/2009    left transtibial amputation  . Excision / curettage bone cyst talus / calcaneus 03/04/2009    partial calcaneal exc. left; placement wound VAC  . Groin debridement 11/16/2008    right rectus femoris muscle flap to right groin wound; right groing debridement  . Femoral endarterectomy 11/06/2008    right common femoral; embolectomy right femoral artery; removal right femoral AV graft  . Central venous catheter insertion 07/10/2008; 12/31/2006; 01/19/2006; 06/19/2005; 09/19/2004; 09/08/2004    right IJ Diatek catheter  . Av fistula repair 07/08/2008    removal left upper arm AV graft  . Thrombectomy / arteriovenous graft revision 05/11/2007;12/31/2006; 01/18/2006; 12/16/2005    left upper arm  . Dialysis fistula creation 01/20/2007    left upper arm AV graft  . Dialysis fistula creation 01/28/2006    right upper arm AV graft  . Thrombectomy / arteriovenous graft revision 10/26/2005    left forearm AV graft  . Dialysis fistula creation 10/04/2005    left forearm AV graft  . Av fistula repair 07/14/2005    removal infected right forearm AV graft  . Thrombectomy / arteriovenous graft revision 03/11/2005; 10/20/2004    right forearm AV graft  . Dialysis fistula creation  09/10/2004    right forearm AV graft  . Av fistula placement 07/18/2004    creation left AV fistula, radial to cephalic  . Finger exploration 07/13/2002    and repair left middle finger  . Carpal tunnel release 01/26/2012    Procedure: CARPAL TUNNEL RELEASE;  Surgeon: Nicki Reaper, MD;  Location: [facility];  Service: Orthopedics;  Laterality: Left;    Family History  Problem Relation Age of Onset  . Diabetes Mother   . Heart disease Mother   . Hypertension Mother   . Heart attack Mother 59  . Diabetes Sister   . Hypertension Brother   . Heart  attack Brother     History  Substance Use Topics  . Smoking status: Never Smoker   . Smokeless tobacco: Never Used  . Alcohol Use: No    OB History    Grav Para Term Preterm Abortions TAB SAB Ect Mult Living                  Review of Systems  Constitutional: Negative for fever, chills, activity change and appetite change.  HENT: Negative for ear pain, congestion, rhinorrhea and neck pain.   Eyes: Negative for pain.  Respiratory: Negative for cough and shortness of breath.   Cardiovascular: Negative for chest pain and palpitations.  Gastrointestinal: Positive for nausea and vomiting. Negative for abdominal pain.  Genitourinary: Negative for dysuria, difficulty urinating and pelvic pain.  Musculoskeletal: Negative for back pain.  Skin: Negative for rash and wound.  Neurological: Negative for weakness and headaches.  Psychiatric/Behavioral: Negative for behavioral problems, confusion and agitation.    Allergies  Percocet and Soap  Home Medications   Current Outpatient Rx  Name Route Sig Dispense Refill  . AMITRIPTYLINE HCL 50 MG PO TABS Oral Take 50 mg by mouth at bedtime.    Marland Kitchen AMLODIPINE BESYLATE 10 MG PO TABS Oral Take 5 mg by mouth daily.    . ASPIRIN EC 81 MG PO TBEC Oral Take 81 mg by mouth daily.    Marland Kitchen CALCIUM ACETATE 667 MG PO CAPS Oral Take 1,334 mg by mouth 3 (three) times daily with meals.     Marland Kitchen GABAPENTIN 100 MG PO CAPS Oral Take 100 mg by mouth at bedtime.    Marland Kitchen HYDROXYZINE HCL 25 MG PO TABS Oral Take 25 mg by mouth 3 (three) times daily as needed. For itching    . INSULIN ASPART PROT & ASPART (70-30) 100 UNIT/ML [place] SUSP Subcutaneous Inject 6-8 Units into the skin 2 (two) times daily with a meal. Take 8 units in the morning and 6 units in the evening    . METOCLOPRAMIDE HCL 10 MG PO TABS Oral Take 10 mg by mouth 3 (three) times daily as needed. For stomach upset    . RENA-VITE PO TABS Oral Take 1 tablet by mouth at bedtime. 90 tablet 3  . RENVELA 800 MG PO TABS  Oral Take 800 mg by mouth 3 (three) times daily with meals.     Marland Kitchen MOXIFLOXACIN HCL 400 MG PO TABS Oral Take 1 tablet (400 mg total) by mouth daily. 5 tablet 0    BP 173/95  Pulse 104  Temp 99.5 F (37.5 C) (Oral)  Resp 16  SpO2 100%  Physical Exam  Constitutional: She is oriented to person, place, and time. She appears well-developed and well-nourished. No distress.  HENT:  Head: Normocephalic and atraumatic.  Nose: Nose normal.  Mouth/Throat: Oropharynx is clear and moist.  Eyes: EOM are normal. Pupils are equal, round, and reactive to light.  Neck: Normal range of motion. Neck supple. No tracheal deviation present.  Cardiovascular: Normal rate, regular rhythm, normal heart sounds and intact distal pulses.   Pulmonary/Chest: Effort normal and breath sounds normal. She has no rales.  Abdominal: Soft. Bowel sounds are normal. She exhibits no distension. There is tenderness ( Epigastric). There is no rebound and no guarding.  Musculoskeletal: Normal range of motion. She exhibits no tenderness.  Neurological: She is alert and oriented to person, place, and time.  Skin: Skin is warm and dry. No rash noted.  Psychiatric: She has a normal mood and affect. Her behavior is normal.    ED Course  Procedures (including critical care time)  Results for orders placed during the hospital encounter of 06/22/12  CBC WITH DIFFERENTIAL      Component Value Range   WBC 16.8 (*) 4.0 - 10.5 K/uL   RBC 2.96 (*) 3.87 - 5.11 MIL/uL   Hemoglobin 9.2 (*) 12.0 - 15.0 g/dL   HCT 11.9 (*) 14.7 - 82.9 %   MCV 93.2  78.0 - 100.0 fL   MCH 31.1  26.0 - 34.0 pg   MCHC 33.3  30.0 - 36.0 g/dL   RDW 56.2 (*) 13.0 - 86.5 %   Platelets 241  150 - 400 K/uL   Neutrophils Relative 71  43 - 77 %   Neutro Abs 11.9 (*) 1.7 - 7.7 K/uL   Lymphocytes Relative 16  12 - 46 %   Lymphs Abs 2.8  0.7 - 4.0 K/uL   Monocytes Relative 12  3 - 12 %   Monocytes Absolute 2.0 (*) 0.1 - 1.0 K/uL   Eosinophils Relative 0  0 - 5 %    Eosinophils Absolute 0.0  0.0 - 0.7 K/uL   Basophils Relative 0  0 - 1 %   Basophils Absolute 0.1  0.0 - 0.1 K/uL  COMPREHENSIVE METABOLIC PANEL      Component Value Range   Sodium 137  135 - 145 mEq/L   Potassium 4.1  3.5 - 5.1 mEq/L   Chloride 96  96 - 112 mEq/L   CO2 29  19 - 32 mEq/L   Glucose, Bld 422 (*) 70 - 99 mg/dL   BUN 14  6 - 23 mg/dL   Creatinine, Ser 7.84 (*) 0.50 - 1.10 mg/dL   Calcium 9.0  8.4 - 69.6 mg/dL   Total Protein 7.2  6.0 - 8.3 g/dL   Albumin 3.0 (*) 3.5 - 5.2 g/dL   AST 44 (*) 0 - 37 U/L   ALT 14  0 - 35 U/L   Alkaline Phosphatase 78  39 - 117 U/L   Total Bilirubin 2.1 (*) 0.3 - 1.2 mg/dL   GFR calc non Af Amer 12 (*) >90 mL/min   GFR calc Af Amer 14 (*) >90 mL/min  LACTIC ACID, PLASMA      Component Value Range   Lactic Acid, Venous 2.4 (*) 0.5 - 2.2 mmol/L  LIPASE, BLOOD      Component Value Range   Lipase 42  11 - 59 U/L  TROPONIN I      Component Value Range   Troponin I <0.30  <0.30 ng/mL  GLUCOSE, CAPILLARY      Component Value Range   Glucose-Capillary 444 (*) 70 - 99 mg/dL   Comment 1 Notify RN    GLUCOSE, CAPILLARY      Component Value Range  Glucose-Capillary 418 (*) 70 - 99 mg/dL   Comment 1 Notify RN        DG Chest 2 View (Final result)   Result time:06/22/12 2237    Final result by Rad Results In Interface (06/22/12 22:37:55)    Narrative:   *RADIOLOGY REPORT*  Clinical Data: Abdominal pain and emesis.  CHEST - 2 VIEW  Comparison: 06/17/2012  Findings: Two views of the chest were obtained. Stable appearance of the heart and mediastinum. Again noted are patchy densities at both lung bases, left side greater than right. The findings have not significantly changed and there may be a chronic component. Upper lungs are clear. Trachea is midline.  IMPRESSION: Persistent bibasilar densities, left side greater than right. Findings could represent acute or chronic changes. Minimal change from the prior  examination.   Original Report Authenticated By: Richarda Overlie, M.D.     Date: 06/23/2012  Rate: 99  Rhythm: normal sinus rhythm  QRS Axis: normal  Intervals: QT prolonged  ST/T Wave abnormalities: normal  Conduction Disutrbances:none  Narrative Interpretation:   Old EKG Reviewed: unchanged and QTC 524 prior EKG with QTC of 502    1. Nausea and vomiting   2. Epigastric abdominal pain       MDM    41 year old chronically ill female with bilateral lower extremity amputations in no acute distress, afebrile, vital signs stable, non toxic appearing who presents with a couple hours history of nausea, emesis and epigastric pain. This x-ray with persistent bibasilar densities with minimal change from her prior examination. The CMP a normal potassium of 4.1. No right upper quadrant pain. No Murphy sign. Doubt cholecystitis. Glucose elevated at 444 patient states she takes nightly insulin and has not taken her dose yet. Administered home dose of insulin in emergency department. Lipase negative doubt pancreatitis. CBC with leukocytosis. Nausea resolved after dose of 10 mg Reglan. Abdominal pain completely resolved with one dose of Dilaudid. Abdominal exam unremarkable with no rebound, guarding, no evidence peritonitis.   12:33 AM patient's glucose remains elevated at 418. Will redose insulin and administered 250 cc of normal saline.  12:35 AM Case discussed with Dr. Manus Gunning. At time of sign out pain and nausea have completely resolved, awaiting CT abdomen pelvis, and venous blood gas results. Plan to obtain better glc control and reassess once imaging available.  Patient states she would like to go home.           Nadara Mustard, MD 06/23/12 [phone]

## 2012-06-22 NOTE — ED Notes (Signed)
Per pt started having abdominal pain about 1pm this afternoon; pt rates pain 8/10; pain is constant and sore;

## 2012-06-22 NOTE — ED Notes (Signed)
Pt c/o nausea, vomiting, and mid abd pain since 1pm.  States she had been home from dialysis 30 min when symptoms started.  Denies diarrhea or constipation.

## 2012-06-22 NOTE — ED Notes (Signed)
PT TRANSPORTED TO XRAY 

## 2012-06-22 NOTE — ED Notes (Signed)
Dr. Ladon Applebaum and Dr. Artis Flock said pt. needs to be seen in ED.  Pt. taken by POV.

## 2012-06-22 NOTE — ED Notes (Addendum)
Pt requesting pain medication; MD informed of pt's request; pt sitting in wheelchair near the door of room moaning and requesting meds.

## 2012-06-23 ENCOUNTER — Observation Stay (HOSPITAL_COMMUNITY)
Admission: EM | Admit: 2012-06-23 | Discharge: 2012-06-26 | Disposition: A | Discharge status: Home / Self Care | Payer: Medicare Other | Attending: Internal Medicine | Admitting: Internal Medicine

## 2012-06-23 ENCOUNTER — Emergency Department (HOSPITAL_COMMUNITY): Payer: Medicare Other

## 2012-06-23 ENCOUNTER — Encounter (HOSPITAL_COMMUNITY): Payer: Self-pay | Admitting: General Practice

## 2012-06-23 DIAGNOSIS — A4902 Methicillin resistant Staphylococcus aureus infection, unspecified site: Secondary | ICD-10-CM

## 2012-06-23 DIAGNOSIS — N039 Chronic nephritic syndrome with unspecified morphologic changes: Secondary | ICD-10-CM | POA: Insufficient documentation

## 2012-06-23 DIAGNOSIS — R7309 Other abnormal glucose: Secondary | ICD-10-CM

## 2012-06-23 DIAGNOSIS — R1013 Epigastric pain: Principal | ICD-10-CM | POA: Insufficient documentation

## 2012-06-23 DIAGNOSIS — T827XXA Infection and inflammatory reaction due to other cardiac and vascular devices, implants and grafts, initial encounter: Secondary | ICD-10-CM

## 2012-06-23 DIAGNOSIS — R197 Diarrhea, unspecified: Secondary | ICD-10-CM | POA: Insufficient documentation

## 2012-06-23 DIAGNOSIS — D72829 Elevated white blood cell count, unspecified: Secondary | ICD-10-CM

## 2012-06-23 DIAGNOSIS — A498 Other bacterial infections of unspecified site: Secondary | ICD-10-CM

## 2012-06-23 DIAGNOSIS — D649 Anemia, unspecified: Secondary | ICD-10-CM

## 2012-06-23 DIAGNOSIS — Z794 Long term (current) use of insulin: Secondary | ICD-10-CM | POA: Insufficient documentation

## 2012-06-23 DIAGNOSIS — I120 Hypertensive chronic kidney disease with stage 5 chronic kidney disease or end stage renal disease: Secondary | ICD-10-CM | POA: Insufficient documentation

## 2012-06-23 DIAGNOSIS — R112 Nausea with vomiting, unspecified: Secondary | ICD-10-CM

## 2012-06-23 DIAGNOSIS — D631 Anemia in chronic kidney disease: Secondary | ICD-10-CM | POA: Insufficient documentation

## 2012-06-23 DIAGNOSIS — R7881 Bacteremia: Secondary | ICD-10-CM

## 2012-06-23 DIAGNOSIS — IMO0001 Reserved for inherently not codable concepts without codable children: Secondary | ICD-10-CM | POA: Diagnosis present

## 2012-06-23 DIAGNOSIS — N186 End stage renal disease: Secondary | ICD-10-CM

## 2012-06-23 DIAGNOSIS — R739 Hyperglycemia, unspecified: Secondary | ICD-10-CM

## 2012-06-23 DIAGNOSIS — Z992 Dependence on renal dialysis: Secondary | ICD-10-CM | POA: Insufficient documentation

## 2012-06-23 DIAGNOSIS — E119 Type 2 diabetes mellitus without complications: Secondary | ICD-10-CM

## 2012-06-23 LAB — POCT I-STAT 3, VENOUS BLOOD GAS (G3P V)
Bicarbonate: 32.6 mEq/L — ABNORMAL HIGH (ref 20.0–24.0)
O2 Saturation: 74 %
TCO2: 34 mmol/L (ref 0–100)
pCO2, Ven: 51.7 mmHg — ABNORMAL HIGH (ref 45.0–50.0)
pO2, Ven: 40 mmHg (ref 30.0–45.0)

## 2012-06-23 LAB — CREATININE, SERUM
GFR calc Af Amer: 10 mL/min — ABNORMAL LOW (ref >90)
GFR calc non Af Amer: 9 mL/min — ABNORMAL LOW (ref >90)

## 2012-06-23 LAB — GLUCOSE, CAPILLARY
Glucose-Capillary: 269 mg/dL — ABNORMAL HIGH (ref 70–99)
Glucose-Capillary: 268 mg/dL — ABNORMAL HIGH (ref 70–99)
Glucose-Capillary: 176 mg/dL — ABNORMAL HIGH (ref 70–99)
Glucose-Capillary: 81 mg/dL (ref 70–99)
Glucose-Capillary: 223 mg/dL — ABNORMAL HIGH (ref 70–99)

## 2012-06-23 LAB — CBC
HCT: 23.6 % — ABNORMAL LOW (ref 36.0–46.0)
MCV: 96.3 fL (ref 78.0–100.0)
RBC: 2.45 MIL/uL — ABNORMAL LOW (ref 3.87–5.11)
WBC: 16.4 10*3/uL — ABNORMAL HIGH (ref 4.0–10.5)

## 2012-06-23 LAB — CBC WITH DIFFERENTIAL/PLATELET
Basophils Relative: 0 % (ref 0–1)
Hemoglobin: 8.8 g/dL — ABNORMAL LOW (ref 12.0–15.0)
Lymphs Abs: 3.7 10*3/uL (ref 0.7–4.0)
Monocytes Relative: 13 % — ABNORMAL HIGH (ref 3–12)
Neutro Abs: 10.8 10*3/uL — ABNORMAL HIGH (ref 1.7–7.7)
Neutrophils Relative %: 65 % (ref 43–77)
RBC: 2.77 MIL/uL — ABNORMAL LOW (ref 3.87–5.11)
WBC: 16.6 10*3/uL — ABNORMAL HIGH (ref 4.0–10.5)

## 2012-06-23 LAB — COMPREHENSIVE METABOLIC PANEL
ALT: 14 U/L (ref 0–35)
Albumin: 3 g/dL — ABNORMAL LOW (ref 3.5–5.2)
Alkaline Phosphatase: 75 U/L (ref 39–117)
BUN: 21 mg/dL (ref 6–23)
Chloride: 92 mEq/L — ABNORMAL LOW (ref 96–112)
Glucose, Bld: 417 mg/dL — ABNORMAL HIGH (ref 70–99)
Potassium: 4.1 mEq/L (ref 3.5–5.1)
Sodium: 133 mEq/L — ABNORMAL LOW (ref 135–145)
Total Bilirubin: 2.3 mg/dL — ABNORMAL HIGH (ref 0.3–1.2)

## 2012-06-23 LAB — TROPONIN I: Troponin I: <0.3 ng/mL (ref <0.30)

## 2012-06-23 LAB — LACTIC ACID, PLASMA: Lactic Acid, Venous: 2.2 mmol/L (ref 0.5–2.2)

## 2012-06-23 MED ORDER — MORPHINE SULFATE 2 MG/ML IJ SOLN
2.0000 mg | INTRAMUSCULAR | Status: DC | PRN
  Administered 2012-06-23 – 2012-06-24 (×4): 2 mg via INTRAVENOUS
  Filled 2012-06-23 (×4): qty 1

## 2012-06-23 MED ORDER — VANCOMYCIN HCL 1000 MG IV SOLR
2000.0000 mg | Freq: Once | INTRAVENOUS | Status: DC
  Filled 2012-06-23: qty 2000

## 2012-06-23 MED ORDER — SODIUM CHLORIDE 0.9 % IV BOLUS (SEPSIS)
500.0000 mL | Freq: Once | INTRAVENOUS | Status: AC
  Administered 2012-06-23: 500 mL via INTRAVENOUS

## 2012-06-23 MED ORDER — VANCOMYCIN HCL IN DEXTROSE 1-5 GM/200ML-% IV SOLN
1000.0000 mg | INTRAVENOUS | Status: DC
  Filled 2012-06-23: qty 200

## 2012-06-23 MED ORDER — INSULIN ASPART 100 UNIT/ML ~~LOC~~ SOLN
0.0000 [IU] | SUBCUTANEOUS | Status: DC
  Administered 2012-06-23: 3 [IU] via SUBCUTANEOUS
  Administered 2012-06-23: 5 [IU] via SUBCUTANEOUS
  Administered 2012-06-24: 2 [IU] via SUBCUTANEOUS
  Administered 2012-06-24 (×2): 5 [IU] via SUBCUTANEOUS
  Administered 2012-06-25: 8 [IU] via SUBCUTANEOUS
  Administered 2012-06-25: 11 [IU] via SUBCUTANEOUS

## 2012-06-23 MED ORDER — METOCLOPRAMIDE HCL 5 MG/ML IJ SOLN
10.0000 mg | Freq: Once | INTRAMUSCULAR | Status: AC
  Administered 2012-06-23: 10 mg via INTRAVENOUS
  Filled 2012-06-23: qty 2

## 2012-06-23 MED ORDER — CHLORHEXIDINE GLUCONATE CLOTH 2 % EX PADS
6.0000 | MEDICATED_PAD | Freq: Every day | CUTANEOUS | Status: DC
  Administered 2012-06-24 – 2012-06-26 (×3): 6 via TOPICAL

## 2012-06-23 MED ORDER — ONDANSETRON HCL 4 MG/2ML IJ SOLN
4.0000 mg | Freq: Once | INTRAMUSCULAR | Status: DC
  Filled 2012-06-23 (×5): qty 2

## 2012-06-23 MED ORDER — INSULIN ASPART 100 UNIT/ML ~~LOC~~ SOLN
6.0000 [IU] | Freq: Once | SUBCUTANEOUS | Status: AC
  Administered 2012-06-23: 6 [IU] via INTRAVENOUS
  Filled 2012-06-23: qty 1

## 2012-06-23 MED ORDER — ONDANSETRON HCL 4 MG PO TABS: 4.0000 mg | ORAL_TABLET | Freq: Four times a day (QID) | ORAL | Status: DC | PRN

## 2012-06-23 MED ORDER — HYDROMORPHONE HCL PF 1 MG/ML IJ SOLN
1.0000 mg | Freq: Once | INTRAMUSCULAR | Status: AC
  Administered 2012-06-23: 1 mg via INTRAVENOUS
  Filled 2012-06-23: qty 1

## 2012-06-23 MED ORDER — SODIUM CHLORIDE 0.9 % IV SOLN: Freq: Once | INTRAVENOUS | Status: DC

## 2012-06-23 MED ORDER — VANCOMYCIN HCL 1000 MG IV SOLR
2000.0000 mg | Freq: Once | INTRAVENOUS | Status: AC
  Administered 2012-06-23: 2000 mg via INTRAVENOUS
  Filled 2012-06-23: qty 2000

## 2012-06-23 MED ORDER — METOCLOPRAMIDE HCL 10 MG PO TABS
10.0000 mg | ORAL_TABLET | Freq: Three times a day (TID) | ORAL | Status: DC | PRN
  Administered 2012-06-24: 10 mg via ORAL
  Filled 2012-06-23 (×2): qty 1

## 2012-06-23 MED ORDER — INSULIN ASPART 100 UNIT/ML ~~LOC~~ SOLN
0.0000 [IU] | SUBCUTANEOUS | Status: DC
  Filled 2012-06-23: qty 1

## 2012-06-23 MED ORDER — SODIUM CHLORIDE 0.9 % IJ SOLN
3.0000 mL | Freq: Two times a day (BID) | INTRAMUSCULAR | Status: DC
  Administered 2012-06-23 – 2012-06-25 (×6): 3 mL via INTRAVENOUS

## 2012-06-23 MED ORDER — HYDROMORPHONE HCL PF 1 MG/ML IJ SOLN
1.0000 mg | INTRAMUSCULAR | Status: DC | PRN
  Administered 2012-06-23 – 2012-06-25 (×7): 1 mg via INTRAVENOUS
  Filled 2012-06-23 (×7): qty 1

## 2012-06-23 MED ORDER — IOHEXOL 300 MG/ML  SOLN
80.0000 mL | Freq: Once | INTRAMUSCULAR | Status: AC | PRN
  Administered 2012-06-23: 80 mL via INTRAVENOUS

## 2012-06-23 MED ORDER — CALCIUM ACETATE 667 MG PO CAPS
1334.0000 mg | ORAL_CAPSULE | Freq: Three times a day (TID) | ORAL | Status: DC
  Administered 2012-06-24 – 2012-06-25 (×4): 1334 mg via ORAL
  Filled 2012-06-23 (×11): qty 2

## 2012-06-23 MED ORDER — ONDANSETRON HCL 4 MG/2ML IJ SOLN
4.0000 mg | Freq: Four times a day (QID) | INTRAMUSCULAR | Status: DC | PRN
  Administered 2012-06-23 – 2012-06-25 (×5): 4 mg via INTRAVENOUS

## 2012-06-23 MED ORDER — GABAPENTIN 100 MG PO CAPS
100.0000 mg | ORAL_CAPSULE | Freq: Every day | ORAL | Status: DC
  Administered 2012-06-23 – 2012-06-25 (×3): 100 mg via ORAL
  Filled 2012-06-23 (×4): qty 1

## 2012-06-23 MED ORDER — ENOXAPARIN SODIUM 30 MG/0.3ML ~~LOC~~ SOLN
30.0000 mg | SUBCUTANEOUS | Status: DC
  Administered 2012-06-23 – 2012-06-25 (×3): 30 mg via SUBCUTANEOUS
  Filled 2012-06-23 (×4): qty 0.3

## 2012-06-23 MED ORDER — MUPIROCIN 2 % EX OINT
1.0000 "application " | TOPICAL_OINTMENT | Freq: Two times a day (BID) | CUTANEOUS | Status: DC
  Administered 2012-06-23 – 2012-06-25 (×4): 1 via NASAL
  Filled 2012-06-23 (×2): qty 22

## 2012-06-23 MED ORDER — SODIUM CHLORIDE 0.9 % IV BOLUS (SEPSIS)
250.0000 mL | Freq: Once | INTRAVENOUS | Status: AC
  Administered 2012-06-23: 250 mL via INTRAVENOUS

## 2012-06-23 MED ORDER — RENA-VITE PO TABS
1.0000 | ORAL_TABLET | Freq: Every day | ORAL | Status: DC
  Administered 2012-06-23 – 2012-06-25 (×3): 1 via ORAL
  Filled 2012-06-23 (×4): qty 1

## 2012-06-23 MED ORDER — DEXTROSE 5 % IV SOLN
2.0000 g | INTRAVENOUS | Status: DC
  Administered 2012-06-24 (×2): 2 g via INTRAVENOUS
  Filled 2012-06-23 (×3): qty 2

## 2012-06-23 MED ORDER — AMITRIPTYLINE HCL 50 MG PO TABS
50.0000 mg | ORAL_TABLET | Freq: Every day | ORAL | Status: DC
  Administered 2012-06-23 – 2012-06-24 (×2): 50 mg via ORAL
  Filled 2012-06-23 (×3): qty 1

## 2012-06-23 MED ORDER — INSULIN ASPART 100 UNIT/ML ~~LOC~~ SOLN
10.0000 [IU] | Freq: Once | SUBCUTANEOUS | Status: AC
  Administered 2012-06-23: 10 [IU] via SUBCUTANEOUS
  Filled 2012-06-23: qty 1

## 2012-06-23 MED ORDER — AMLODIPINE BESYLATE 5 MG PO TABS
5.0000 mg | ORAL_TABLET | Freq: Every day | ORAL | Status: DC
  Administered 2012-06-23 – 2012-06-25 (×3): 5 mg via ORAL
  Filled 2012-06-23 (×4): qty 1

## 2012-06-23 MED ORDER — INSULIN ASPART 100 UNIT/ML ~~LOC~~ SOLN
20.0000 [IU] | Freq: Once | SUBCUTANEOUS | Status: AC
  Administered 2012-06-23: 10:00:00 via SUBCUTANEOUS

## 2012-06-23 MED ORDER — IOHEXOL 300 MG/ML  SOLN: 80.0000 mL | Freq: Once | INTRAMUSCULAR | Status: DC | PRN

## 2012-06-23 MED ORDER — HYDROXYZINE HCL 25 MG PO TABS
25.0000 mg | ORAL_TABLET | Freq: Three times a day (TID) | ORAL | Status: DC | PRN
  Administered 2012-06-23: 25 mg via ORAL
  Filled 2012-06-23: qty 1

## 2012-06-23 MED ORDER — ASPIRIN EC 81 MG PO TBEC
81.0000 mg | DELAYED_RELEASE_TABLET | Freq: Every day | ORAL | Status: DC
  Administered 2012-06-23 – 2012-06-25 (×3): 81 mg via ORAL
  Filled 2012-06-23 (×4): qty 1

## 2012-06-23 MED ORDER — SEVELAMER CARBONATE 800 MG PO TABS
800.0000 mg | ORAL_TABLET | Freq: Three times a day (TID) | ORAL | Status: DC
  Administered 2012-06-24 – 2012-06-25 (×4): 800 mg via ORAL
  Filled 2012-06-23 (×11): qty 1

## 2012-06-23 NOTE — ED Notes (Signed)
Report received, assumed care.  

## 2012-06-23 NOTE — ED Provider Notes (Signed)
Medical screening examination/treatment/procedure(s) were conducted as a shared visit with non-physician practitioner(s) and myself.  I personally evaluated the patient during the encounter.  Angiocath insertion Performed by: Derwood Kaplan  Consent: Verbal consent obtained. Risks and benefits: risks, benefits and alternatives were discussed Time out: Immediately prior to procedure a "time out" was called to verify the correct patient, procedure, equipment, support staff and site/side marked as required.  Preparation: Patient was prepped and draped in the usual sterile fashion.  Vein Location: EJ - right  Ultrasound Guidance used  Gauge: 18 gauge  Normal blood return and flush without difficulty Patient tolerance: Patient tolerated the procedure well with no immediate complications.    Derwood Kaplan, MD 06/23/12 1506

## 2012-06-23 NOTE — ED Provider Notes (Signed)
I saw and evaluated the patient, reviewed the resident's note and I agree with the findings and plan and agree with their ECG interpretation. Patient was abdominal pain nausea and vomiting. Hyperglycemia. Laboratories an elevated white count. Pain has resolved. CT will be done to be followed by Dr. Azell Der R. Rubin Payor, MD 06/23/12 [phone]

## 2012-06-23 NOTE — ED Notes (Signed)
Dr. Rito Ehrlich aware of repeat CBG, additional orders received

## 2012-06-23 NOTE — ED Provider Notes (Addendum)
History     CSN: [id number]  Arrival date & time 06/23/12  4098   First MD Initiated Contact with Patient 06/23/12 [phone]      Chief Complaint  Patient presents with  . Emesis  . Abdominal Pain    (Consider location/radiation/quality/duration/timing/severity/associated sxs/prior treatment) HPI Comments: Left AMA two hours ago. Returns with abdominal pain and vomiting that started 20 minutes after she returned home.  Similar to pain she had yesterday evening after dialysis.  Denies chest pain, SOB, palpitations.  Now having some diarrhea as well.  Has had similar episodes in the past that she was told were gastroparesis.   The history is provided by the patient and the EMS personnel.    Past Medical History  Diagnosis Date  . GERD (gastroesophageal reflux disease)   . Diabetes mellitus     IDDM  . Hypertension     states has been on med. x "a long time"  . ESRF (end stage renal failure)     dialysis M,W, F; left thigh AV graft  . History of gangrene     left foot  . Hx MRSA infection   . 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 Av fistula placement 10/18/2008    right thigh AV graft  . Finger debridement 06/20/2010    right middle finger  . Finger amputation 05/27/2010    right middle  . Thrombectomy / arteriovenous graft revision 01/14/2010    left superficial femoral artery; left thigh AV graft placement  . 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 Leg amputation below knee 10/11/2009    right  . Central venous catheter insertion 08/06/2009    removal right IJ Diatek cath., placement left IJ Diatek cath.  . 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 Foot amputation 04/04/2009    left transtibial amputation  . Excision / curettage bone cyst talus / calcaneus 03/04/2009    partial calcaneal exc. left; placement wound VAC  . Groin debridement 11/16/2008    right rectus femoris muscle flap to right groin wound;  right groing debridement  . Femoral endarterectomy 11/06/2008    right common femoral; embolectomy right femoral artery; removal right femoral AV graft  . Central venous catheter insertion 07/10/2008; 12/31/2006; 01/19/2006; 06/19/2005; 09/19/2004; 09/08/2004    right IJ Diatek catheter  . Av fistula repair 07/08/2008    removal left upper arm AV graft  . Thrombectomy / arteriovenous graft revision 05/11/2007;12/31/2006; 01/18/2006; 12/16/2005    left upper arm  . Dialysis fistula creation 01/20/2007    left upper arm AV graft  . Dialysis fistula creation 01/28/2006    right upper arm AV graft  . Thrombectomy / arteriovenous graft revision 10/26/2005    left forearm AV graft  . Dialysis fistula creation 10/04/2005    left forearm AV graft  . Av fistula repair 07/14/2005    removal infected right forearm AV graft  . Thrombectomy / arteriovenous graft revision 03/11/2005; 10/20/2004    right forearm AV graft  . Dialysis fistula creation 09/10/2004    right forearm AV graft  . Av fistula placement 07/18/2004    creation left AV fistula, radial to cephalic  . Finger exploration 07/13/2002    and repair left middle finger  . Carpal tunnel release 01/26/2012    Procedure: CARPAL TUNNEL RELEASE;  Surgeon: Nicki Reaper, MD;  Location: [facility];  Service: Orthopedics;  Laterality: Left;    Family History  Problem Relation Age of Onset  . Diabetes Mother   . Heart disease Mother   . Hypertension Mother   . Heart attack Mother 17  . Diabetes Sister   . Hypertension Brother   . Heart attack Brother     History  Substance Use Topics  . Smoking status: Never Smoker   . Smokeless tobacco: Never Used  . Alcohol Use: No    OB History    Grav Para Term Preterm Abortions TAB SAB Ect Mult Living                  Review of Systems  Constitutional: Positive for activity change and appetite change. Negative for fever.  HENT: Negative for congestion and rhinorrhea.   Respiratory: Negative  for cough, chest tightness and shortness of breath.   Cardiovascular: Negative for chest pain.  Gastrointestinal: Positive for nausea, vomiting, abdominal pain and diarrhea. Negative for constipation.  Genitourinary: Negative for dysuria, vaginal bleeding and vaginal discharge.  Musculoskeletal: Negative for back pain.  Skin: Negative for rash.  Neurological: Positive for weakness. Negative for dizziness and headaches.    Allergies  Percocet and Soap  Home Medications   Current Outpatient Rx  Name Route Sig Dispense Refill  . AMITRIPTYLINE HCL 50 MG PO TABS Oral Take 50 mg by mouth at bedtime.    Marland Kitchen AMLODIPINE BESYLATE 10 MG PO TABS Oral Take 5 mg by mouth daily.    . ASPIRIN EC 81 MG PO TBEC Oral Take 81 mg by mouth daily.    Marland Kitchen CALCIUM ACETATE 667 MG PO CAPS Oral Take 1,334 mg by mouth 3 (three) times daily with meals.     Marland Kitchen GABAPENTIN 100 MG PO CAPS Oral Take 100 mg by mouth at bedtime.    Marland Kitchen HYDROXYZINE HCL 25 MG PO TABS Oral Take 25 mg by mouth 3 (three) times daily as needed. For itching    . INSULIN ASPART PROT & ASPART (70-30) 100 UNIT/ML [place] SUSP Subcutaneous Inject 6-8 Units into the skin 2 (two) times daily with a meal. Take 8 units in the morning and 6 units in the evening    . METOCLOPRAMIDE HCL 10 MG PO TABS Oral Take 10 mg by mouth 3 (three) times daily as needed. For stomach upset    . MOXIFLOXACIN HCL 400 MG PO TABS Oral Take 1 tablet (400 mg total) by mouth daily. 5 tablet 0  . RENA-VITE PO TABS Oral Take 1 tablet by mouth at bedtime. 90 tablet 3  . RENVELA 800 MG PO TABS Oral Take 800 mg by mouth 3 (three) times daily with meals.       There were no vitals taken for this visit.  Physical Exam  Constitutional: She is oriented to person, place, and time. She appears well-developed and well-nourished. No distress.  HENT:  Head: Normocephalic and atraumatic.  Mouth/Throat: Oropharynx is clear and moist. No oropharyngeal exudate.  Eyes: Conjunctivae normal are normal.  Pupils are equal, round, and reactive to light.  Neck: Normal range of motion. Neck supple.  Cardiovascular: Normal rate and normal heart sounds.        tachycardia  Pulmonary/Chest: Effort normal and breath sounds normal. No respiratory distress.  Abdominal: Soft. There is tenderness. There is no rebound and no guarding.       Mild diffuse tenderness, worse in epigastrum.  No peritoneal signs.  Musculoskeletal: Normal range of motion.  Bilateral AKA  Neurological: She is alert and oriented to person, place, and time. No cranial nerve deficit.       Moving all extremities, no deficits.  Skin: Skin is warm.    ED Course  Procedures (including critical care time)   Labs Reviewed  CBC WITH DIFFERENTIAL  COMPREHENSIVE METABOLIC PANEL  LACTIC ACID, PLASMA  LIPASE, BLOOD   Dg Chest 2 View  06/22/2012  *RADIOLOGY REPORT*  Clinical Data: Abdominal pain and emesis.  CHEST - 2 VIEW  Comparison: 06/17/2012  Findings: Two views of the chest were obtained.  Stable appearance of the heart and mediastinum.  Again noted are patchy densities at both lung bases, left side greater than right.  The findings have not significantly changed and there may be a chronic component. Upper lungs are clear.  Trachea is midline.  IMPRESSION: Persistent bibasilar densities, left side greater than right. Findings could represent acute or chronic changes.  Minimal change from the prior examination.   Original Report Authenticated By: Richarda Overlie, M.D.    Ct Abdomen Pelvis W Contrast  06/23/2012  *RADIOLOGY REPORT*  Clinical Data: Emesis and abdominal pain.  End-stage renal disease and hemodialysis.  CT ABDOMEN AND PELVIS WITH CONTRAST  Technique:  Multidetector CT imaging of the abdomen and pelvis was performed following the standard protocol during bolus administration of intravenous contrast.  Contrast: 80mL OMNIPAQUE IOHEXOL 300 MG/ML  SOLN  Comparison: CT 04/24/2012  Findings: There are patchy parenchymal  densities at the lung bases. This could represent chronic changes and atelectasis.  There is no evidence for free intraperitoneal air.  The patient has a left femoral dialysis AV graft.  No gross abnormality to the liver, portal venous system or gallbladder. There is a normal appearance of the spleen, pancreas and adrenal glands.  Both kidneys are atrophic.  Evidence for bilateral renal cortical cysts and punctate renal calcifications which may be vascular.  There are extensive calcifications involving the visceral arteries.  Normal caliber to the abdominal aorta.  The inferior vena cava and iliac vessels appear to be patent.  A calcified fibroid along the right side of the uterus.  No significant free fluid or lymphadenopathy.  No acute bony abnormality.  IMPRESSION: No acute abnormalities within the abdomen or pelvis.  Patchy densities in the lung bases could represent scarring and/or atelectasis.  Renal atrophy and renal cortical cysts.  Findings are compatible with end-stage renal disease.   Original Report Authenticated By: Richarda Overlie, M.D.      No diagnosis found.    MDM  Dialysis patient with nausea, vomiting, abdominal pain.  Left AMA two hours ago.  Leukocytosis, lactic acidosis, tachycardia.  No fever.  CT without acute pathology.  Episgastric pain, nausea, vomiting, possibly secondary to gastroparesis.  Patient agreeable to admission at this time.  Hyperglycemia without evidence of DKA.  Leukocytosis persists from previous admission. Patient has been on empiric avelox. Improving lactate  Admission d/w Dr. Adela Glimpse who will pass information along to day team.   Date: 06/23/12  Rate: 99  Rhythm: normal sinus rhythm  QRS Axis: normal  Intervals: normal  ST/T Wave abnormalities: normal  Conduction Disutrbances:none  Narrative Interpretation:   Old EKG Reviewed: unchanged   Glynn Octave, MD 06/23/12 2245  Glynn Octave, MD 10/04/12 1549

## 2012-06-23 NOTE — ED Provider Notes (Signed)
I was asked to place temp orders on patient since they have not been completed. Per admitting MD: tele bed, renal floor, Team 7.   Renne Crigler, [place] 06/23/12 [phone]

## 2012-06-23 NOTE — ED Notes (Signed)
Pt d/c fm ED approx 0200 and called [facility] to return to ED 0415 for nausea and abdominal pain.

## 2012-06-23 NOTE — H&P (Signed)
[facility] History and Physical  [name]:[id number] DOB: 23-Jul-1971 DOA: 06/23/2012  Referring physician: Donette Larry, ER Physician PCP: Lieutenant Diego, MD  Specialists: Vinson Moselle, Nephrology  Chief Complaint: Abd pain w/ nausea & vomiting  HPI: [name] is a 41 y.o. female with past medical history of hypertension, end-stage renal disease and diabetes mellitus (which is actually relatively well controlled) who was recently discharged from the hospitalist service 4 days ago for a severe anemia which was felt to be secondary to end-stage renal disease and at that time noted to have a mild leukocytosis of unknown origin and was put on empiric Avelox. She was hospitalized 2 months ago for an Acinetobacter bacteremia. She came to the emergency room today for several days uncontrolled nausea and vomiting and severe abdominal pain. She reported no diarrhea. Her white blood cell count was noted elevated at 16 and her sugars were in the 400s with a minimally elevated anion gap. Patient was hydrated and given subcutaneous insulin. A CT scan of the abdomen and pelvis was unremarkable. As her pain improved, She insisted on leaving AGAINST MEDICAL ADVICE because she had to 10 to her daughter, but then came back several hours later. She was given more subcutaneous insulin and then hospitalists were called for further evaluation and treatment.  When I saw the patient after she was transferred up to the medical floor, she's feeling better. Her abdominal pain had subsided and she was requesting something to drink. She denied any headaches, vision changes, dysphasia, chest pain, palpitations, shortness of breath, wheeze, cough, hematuria, dysuria, constipation, diarrhea, focal extremity numbness weakness or pain. Several hours later, her abdominal pain recurred. At that point she was describing it as a 10 out of 10. She describes it as generalized and nonfocal. Review of systems otherwise  negative.  Review of Systems:   Past Medical History  Diagnosis Date  . GERD (gastroesophageal reflux disease)   . Diabetes mellitus     IDDM  . Hypertension     states has been on med. x "a long time"  . ESRF (end stage renal failure)     dialysis M,W, F; left thigh AV graft  . History of gangrene     left foot  . Hx MRSA infection   . 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 Av fistula placement 10/18/2008    right thigh AV graft  . Finger debridement 06/20/2010    right middle finger  . Finger amputation 05/27/2010    right middle  . Thrombectomy / arteriovenous graft revision 01/14/2010    left superficial femoral artery; left thigh AV graft placement  . 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 Leg amputation below knee 10/11/2009    right  . Central venous catheter insertion 08/06/2009    removal right IJ Diatek cath., placement left IJ Diatek cath.  . 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 Foot amputation 04/04/2009    left transtibial amputation  . Excision / curettage bone cyst talus / calcaneus 03/04/2009    partial calcaneal exc. left; placement wound VAC  . Groin debridement 11/16/2008    right rectus femoris muscle flap to right groin wound; right groing debridement  . Femoral endarterectomy 11/06/2008    right common femoral; embolectomy right femoral artery; removal right femoral AV graft  . Central venous catheter insertion 07/10/2008; 12/31/2006; 01/19/2006; 06/19/2005; 09/19/2004; 09/08/2004  right IJ Diatek catheter  . Av fistula repair 07/08/2008    removal left upper arm AV graft  . Thrombectomy / arteriovenous graft revision 05/11/2007;12/31/2006; 01/18/2006; 12/16/2005    left upper arm  . Dialysis fistula creation 01/20/2007    left upper arm AV graft  . Dialysis fistula creation 01/28/2006    right upper arm AV graft  . Thrombectomy / arteriovenous graft revision 10/26/2005    left forearm AV graft  .  Dialysis fistula creation 10/04/2005    left forearm AV graft  . Av fistula repair 07/14/2005    removal infected right forearm AV graft  . Thrombectomy / arteriovenous graft revision 03/11/2005; 10/20/2004    right forearm AV graft  . Dialysis fistula creation 09/10/2004    right forearm AV graft  . Av fistula placement 07/18/2004    creation left AV fistula, radial to cephalic  . Finger exploration 07/13/2002    and repair left middle finger  . Carpal tunnel release 01/26/2012    Procedure: CARPAL TUNNEL RELEASE;  Surgeon: Nicki Reaper, MD;  Location: [facility];  Service: Orthopedics;  Laterality: Left;   Social History:  reports that she has never smoked. She has never used smokeless tobacco. She reports that she does not drink alcohol or use illicit drugs. Patient was at home with her family. She's had multiple amputations including bilateral above-the-knee and finger.  Allergies  Allergen Reactions  . Percocet (Oxycodone-Acetaminophen) Other (See Comments)    GI UPSET  . Soap Itching    IVORY SOAP    Family History  Problem Relation Age of Onset  . Diabetes Mother   . Heart disease Mother   . Hypertension Mother   . Heart attack Mother 95  . Diabetes Sister   . Hypertension Brother   . Heart attack Brother     Prior to Admission medications   Medication Sig Start Date End Date Taking? Authorizing Provider  amitriptyline (ELAVIL) 50 MG tablet Take 50 mg by mouth at bedtime. 05/04/12  Yes Historical Provider, MD  amLODipine (NORVASC) 10 MG tablet Take 5 mg by mouth daily.   Yes Historical Provider, MD  aspirin EC 81 MG tablet Take 81 mg by mouth daily.   Yes Historical Provider, MD  calcium acetate (PHOSLO) 667 MG capsule Take 1,334 mg by mouth 3 (three) times daily with meals.    Yes Historical Provider, MD  gabapentin (NEURONTIN) 100 MG capsule Take 100 mg by mouth at bedtime. 05/12/12  Yes Historical Provider, MD  hydrOXYzine (ATARAX/VISTARIL) 25 MG tablet  Take 25 mg by mouth 3 (three) times daily as needed. For itching   Yes Historical Provider, MD  insulin aspart protamine-insulin aspart (NOVOLOG 70/30) (70-30) 100 UNIT/ML injection Inject 6-8 Units into the skin 2 (two) times daily with a meal. Take 8 units in the morning and 6 units in the evening   Yes Historical Provider, MD  metoCLOPramide (REGLAN) 10 MG tablet Take 10 mg by mouth 3 (three) times daily as needed. For stomach upset   Yes Historical Provider, MD  moxifloxacin (AVELOX) 400 MG tablet Take 1 tablet (400 mg total) by mouth daily. 06/19/12  Yes Catarina Hartshorn, MD  multivitamin (RENA-VIT) TABS tablet Take 1 tablet by mouth at bedtime. 04/29/12  Yes Renae Fickle, MD  RENVELA 800 MG tablet Take 800 mg by mouth 3 (three) times daily with meals.  05/04/12  Yes Historical Provider, MD   Physical Exam: Filed Vitals:   06/23/12 9604 06/23/12  0900 06/23/12 1000 06/23/12 1210  BP: 165/83 176/81 167/81 145/80  Pulse: 95 94 94 87  Temp: 99.1 F (37.3 C)  98.8 F (37.1 C) 98.2 F (36.8 C)  TempSrc: Oral  Oral Oral  Resp:  14 14 16   SpO2: 100% 100% 100% 100%     General:  Alert and oriented, fatigued, looks much older than stated age, no acute distress when I saw her given her pain was under control  Eyes: Sclera nonicteric, extraocular movements are intact.  ENT: Normocephalic, atraumatic, mucous membranes are slightly dry  Neck: Supple, no JVD  Cardiovascular: Regular rate and rhythm, S1-S2, 2/6 systolic ejection murmur  Respiratory: Decreased breath sounds at the bases  Abdomen: Soft, obese, nontender, hypoactive bowel sounds  Musculoskeletal: Status post bilateral knee amputations, trace edema  Psychiatric: Patient is appropriate, no evidence of psychoses  Neurologic: No focal deficits  Labs on Admission:  Basic Metabolic Panel:  Lab 06/23/12 1610 06/22/12 2018 06/18/12 0505 06/17/12 1445 06/17/12 1052  NA 133* 137 137 138 140  K 4.1 4.1 4.1 3.8 3.3*  CL 92* 96 98 96  96  CO2 27 29 27  -- 30  GLUCOSE 417* 422* 230* 264* 129*  BUN 21 14 17 22 20   CREATININE 5.02* 4.26* 3.70* 4.10* 3.40*  CALCIUM 9.3 9.0 8.7 -- 9.1  MG -- -- -- -- --  PHOS -- -- -- -- --   Liver Function Tests:  Lab 06/23/12 0550 06/22/12 2018  AST 55* 44*  ALT 14 14  ALKPHOS 75 78  BILITOT 2.3* 2.1*  PROT 7.2 7.2  ALBUMIN 3.0* 3.0*    Lab 06/23/12 0550 06/22/12 2018  LIPASE 47 42  AMYLASE -- --   CBC:  Lab 06/23/12 0550 06/22/12 2018 06/19/12 0500 06/18/12 0505 06/17/12 2336 06/17/12 1052  WBC 16.6* 16.8* 8.4 11.6* -- 12.6*  NEUTROABS 10.8* 11.9* -- -- -- --  HGB 8.8* 9.2* 8.2* 8.7* 8.6* --  HCT 25.9* 27.6* 24.6* 24.9* 25.8* --  MCV 93.5 93.2 92.5 92.6 -- 96.3  PLT 248 241 260 195 -- 369   Cardiac Enzymes:  Lab 06/22/12 2326  CKTOTAL --  CKMB --  CKMBINDEX --  TROPONINI <0.30    CBG:  Lab 06/23/12 1129 06/23/12 1011 06/23/12 0915 06/23/12 0210 06/23/12 0119  GLUCAP 176* 268* 309* 269* 313*    Radiological Exams on Admission: Dg Chest 2 View  06/22/2012   IMPRESSION: Persistent bibasilar densities, left side greater than right. Findings could represent acute or chronic changes.  Minimal change from the prior examination.   Original Report Authenticated By: Richarda Overlie, M.D.    Ct Abdomen Pelvis W Contrast  06/23/2012  .  IMPRESSION: No acute abnormalities within the abdomen or pelvis.  Patchy densities in the lung bases could represent scarring and/or atelectasis.  Renal atrophy and renal cortical cysts.  Findings are compatible with end-stage renal disease.   Original Report Authenticated By: Richarda Overlie, M.D.     EKG: Independently reviewed. Normal sinus rhythm with prolonged QTC  Assessment/Plan Principal Problem:  *Hyperglycemia: Her A1c is actually very well controlled. Suspect that the skin daily infection as the cause of her symptoms. For now sliding scale every 4 hours plus IV fluids. Not in DKA Active Problems:  End stage renal disease: Have  counseled of nephrology for routine dialysis.  Leucocytosis: Certainly concerning given that she has been on by mouth Avelox for the last few days. Sources possibly could be abdominal versus stress margination versus from  graft. Patient is a previous history of graft removal. Have ordered blood cultures and started vancomycin plus cefepime. If her white count is worse, or she spikes a fever, will consult infectious disease in the morning..  Diabetes mellitus: See above  Anemia: Recently evaluated, felt to be secondary to anemia of chronic renal disease.  History of MRSA infection: Noted  Code Status: Full code (must indicate code status--if unknown or must be presumed, indicate so) Family Communication: Left message with her sister Disposition Plan: Likely be in the hospital for several days as we get to source of infection and pain.  Time spent: 40 minutes  Hollice Espy [facility] Pager [phone]  If 7PM-7AM, please contact night-coverage [email or web address] Password [email or web address] 06/23/2012, 1:43 PM

## 2012-06-23 NOTE — Progress Notes (Signed)
ANTIBIOTIC CONSULT NOTE - INITIAL  Pharmacy Consult for Cefepime and Vancomycin Indication: Pt with DM, ?wound infection  Allergies  Allergen Reactions  . Percocet (Oxycodone-Acetaminophen) Other (See Comments)    GI UPSET  . Soap Itching    IVORY SOAP    Patient Measurements: Height: 5\' 3"  (160 cm) (from 06/17/12) Weight: 184 lb 15.5 oz (83.901 kg) (from 06/17/12) IBW/kg (Calculated) : 52.4    Vital Signs: Temp: 98.2 F (36.8 C) (10/24 1210) Temp src: Oral (10/24 1210) BP: 145/80 mmHg (10/24 1210) Pulse Rate: 87  (10/24 1210) Intake/Output from previous day:   Intake/Output from this shift: Total I/O In: 500 [I.V.:500] Out: -   Labs:  Basename 06/23/12 1423 06/23/12 0550 06/22/12 2018  WBC 16.4* 16.6* 16.8*  HGB 8.0* 8.8* 9.2*  PLT 210 248 241  LABCREA -- -- --  CREATININE 5.51* 5.02* 4.26*   Estimated Creatinine Clearance: 13.6 ml/min (by C-G formula based on Cr of 5.51). No results found for this basename: VANCOTROUGH:2,VANCOPEAK:2,VANCORANDOM:2,GENTTROUGH:2,GENTPEAK:2,GENTRANDOM:2,TOBRATROUGH:2,TOBRAPEAK:2,TOBRARND:2,AMIKACINPEAK:2,AMIKACINTROU:2,AMIKACIN:2, in the last 72 hours   Microbiology: Recent Results (from the past 720 hour(s))  CULTURE, BLOOD (ROUTINE X 2)     Status: Normal (Preliminary result)   Collection Time   06/17/12  6:00 PM      Component Value Range Status Comment   Specimen Description BLOOD HAND RIGHT   Final    Special Requests BOTTLES DRAWN AEROBIC ONLY 5CC   Final    Culture  Setup Time 06/18/2012 01:38   Final    Culture     Final    Value:        BLOOD CULTURE RECEIVED NO GROWTH TO DATE CULTURE WILL BE HELD FOR 5 DAYS BEFORE ISSUING A FINAL NEGATIVE REPORT   Report Status PENDING   Incomplete     Medical History: Past Medical History  Diagnosis Date  . GERD (gastroesophageal reflux disease)   . Diabetes mellitus     IDDM  . Hypertension     states has been on med. x "a long time"  . ESRF (end stage renal failure)    dialysis M,W, F; left thigh AV graft  . History of gangrene     left foot  . Hx MRSA infection   . 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 amitriptyline  50 mg Oral QHS  . amLODipine  5 mg Oral Daily  . aspirin EC  81 mg Oral Daily  . calcium acetate  1,334 mg Oral TID WC  . enoxaparin (LOVENOX) injection  30 mg Subcutaneous Q24H  . gabapentin  100 mg Oral QHS  .  HYDROmorphone (DILAUDID) injection  1 mg Intravenous Once  . insulin aspart  0-15 Units Subcutaneous Q4H  . insulin aspart  10 Units Subcutaneous Once  . insulin aspart  20 Units Subcutaneous Once  . metoCLOPramide (REGLAN) injection  10 mg Intravenous Once  . multivitamin  1 tablet Oral QHS  . ondansetron (ZOFRAN) IV  4 mg Intravenous Once  . sevelamer  800 mg Oral TID WC  . sodium chloride  3 mL Intravenous Q12H  . DISCONTD: sodium chloride   Intravenous Once  . DISCONTD: insulin aspart  0-9 Units Subcutaneous Q4H    Assessment: 41 yo female with history of DM,  ESRD on hemodialysis qMWF, gangrene left foot, bilateral AKA and hx of MRSA infection admitted on 06/22/12 due to several days of nausea, vomiting and epigastric abdominal pain. Recently discharged from [facility] on  06/20/12 with discharge diagnosis of severe anemia secondary to ESRD, DM type 2 and mild leukocytosis of unknown origin (DC'd on empiric Moxifloxacin).  She was  previously on a 6 week course of IV vancomycin 1gm qHD(MWF),  IV ceftazidime and oral rifampin for MRSA (cultured from drainage from graft) and bacteremia (Acinetobacter calcoaceticus cultured) in Aug/Sept 2013.    Goal of Therapy:  Pre HD vancomycin level 15-25 mcg/ml  Plan:  Cefepime 2 g IV tonight then 2 g IV qHD (MWF). Vancomycin  2g IV tonight then 1 g IV qHD (MWF) Monitor cultures and vancomycin pre HD level per protocol.  Noah Delaine, RPh Clinical Pharmacist Pager: [phone] 06/23/2012,4:47 PM

## 2012-06-23 NOTE — ED Provider Notes (Signed)
Medical screening examination/treatment/procedure(s) were conducted as a shared visit with non-physician practitioner(s) and myself.  I personally evaluated the patient during the encounter   Glynn Octave, MD 06/23/12 2243

## 2012-06-23 NOTE — ED Provider Notes (Addendum)
Assumed care of patient from Dr. Rubin Payor at 1200 am.  Episode of epigastric pain and vomiting.  Feels back to baseline.  Elevated HR, lactate, and sugar.  Blood sugar improved.  HR remains elevated in 100s.  Patient sleeping comfortably.   CT without acute pathology.  Repeat lactate 3.3 from 2.4.  HR remains elevated.  Admission recommended for IVF and observation.  Uncertain etiology of lactic acidosis, no evidence of DKA. Patient refuses to be admitted, stating she needs to go home because her daughter has to go to school. Patient alert and oriented x 3. She understands the risks of worsening symptoms.  She is able to make her own decisions and understands that she is leaving against medical advice.  Glynn Octave, MD 06/23/12 [id number]  Glynn Octave, MD 06/23/12 [id number]

## 2012-06-24 ENCOUNTER — Observation Stay (HOSPITAL_COMMUNITY): Payer: Medicare Other

## 2012-06-24 DIAGNOSIS — D649 Anemia, unspecified: Secondary | ICD-10-CM

## 2012-06-24 LAB — CBC
Hemoglobin: 6.9 g/dL — CL (ref 12.0–15.0)
MCH: 31.9 pg (ref 26.0–34.0)
MCHC: 34 g/dL (ref 30.0–36.0)
Platelets: 258 10*3/uL (ref 150–400)
Platelets: 272 10*3/uL (ref 150–400)
RBC: 2.16 MIL/uL — ABNORMAL LOW (ref 3.87–5.11)
RDW: 17.3 % — ABNORMAL HIGH (ref 11.5–15.5)
WBC: 18.3 10*3/uL — ABNORMAL HIGH (ref 4.0–10.5)
WBC: 14.3 10*3/uL — ABNORMAL HIGH (ref 4.0–10.5)

## 2012-06-24 LAB — RENAL FUNCTION PANEL
CO2: 28 mEq/L (ref 19–32)
Calcium: 9.5 mg/dL (ref 8.4–10.5)
Chloride: 94 mEq/L — ABNORMAL LOW (ref 96–112)
GFR calc Af Amer: 7 mL/min — ABNORMAL LOW (ref >90)
GFR calc non Af Amer: 6 mL/min — ABNORMAL LOW (ref >90)
Potassium: 4.4 mEq/L (ref 3.5–5.1)
Sodium: 134 mEq/L — ABNORMAL LOW (ref 135–145)

## 2012-06-24 LAB — GLUCOSE, CAPILLARY
Glucose-Capillary: 141 mg/dL — ABNORMAL HIGH (ref 70–99)
Glucose-Capillary: 79 mg/dL (ref 70–99)
Glucose-Capillary: 197 mg/dL — ABNORMAL HIGH (ref 70–99)

## 2012-06-24 LAB — BASIC METABOLIC PANEL
BUN: 35 mg/dL — ABNORMAL HIGH (ref 6–23)
Calcium: 9.6 mg/dL (ref 8.4–10.5)
Chloride: 95 mEq/L — ABNORMAL LOW (ref 96–112)
Creatinine, Ser: 6.75 mg/dL — ABNORMAL HIGH (ref 0.50–1.10)
GFR calc Af Amer: 8 mL/min — ABNORMAL LOW (ref >90)
GFR calc non Af Amer: 7 mL/min — ABNORMAL LOW (ref >90)

## 2012-06-24 LAB — MRSA PCR SCREENING: MRSA by PCR: POSITIVE — AB

## 2012-06-24 LAB — CULTURE, BLOOD (ROUTINE X 2): Culture: NO GROWTH

## 2012-06-24 LAB — PREPARE RBC (CROSSMATCH)

## 2012-06-24 MED ORDER — PARICALCITOL 5 MCG/ML IV SOLN
INTRAVENOUS | Status: AC
  Administered 2012-06-24: 6 ug via INTRAVENOUS
  Filled 2012-06-24: qty 2

## 2012-06-24 MED ORDER — VANCOMYCIN HCL 1000 MG IV SOLR
750.0000 mg | INTRAVENOUS | Status: DC
  Administered 2012-06-24: 750 mg via INTRAVENOUS
  Filled 2012-06-24: qty 750

## 2012-06-24 MED ORDER — SODIUM CHLORIDE 0.9 % IV SOLN: 100.0000 mg | INTRAVENOUS | Status: DC

## 2012-06-24 MED ORDER — SODIUM CHLORIDE 0.9 % IV SOLN
100.0000 mg | INTRAVENOUS | Status: DC
  Administered 2012-06-24: 100 mg via INTRAVENOUS
  Filled 2012-06-24: qty 2

## 2012-06-24 MED ORDER — DARBEPOETIN ALFA-POLYSORBATE 200 MCG/0.4ML IJ SOLN
200.0000 ug | INTRAMUSCULAR | Status: DC
  Administered 2012-06-24: 200 ug via INTRAVENOUS

## 2012-06-24 MED ORDER — DARBEPOETIN ALFA-POLYSORBATE 200 MCG/0.4ML IJ SOLN
INTRAMUSCULAR | Status: AC
  Administered 2012-06-24: 200 ug via INTRAVENOUS
  Filled 2012-06-24: qty 0.4

## 2012-06-24 MED ORDER — PARICALCITOL 5 MCG/ML IV SOLN
6.0000 ug | INTRAVENOUS | Status: DC
  Administered 2012-06-24: 6 ug via INTRAVENOUS
  Filled 2012-06-24 (×2): qty 1.2

## 2012-06-24 NOTE — Consult Note (Signed)
[facility] Renal Consultation Note  Indication for Consultation:  Management of ESRD/hemodialysis; anemia, hypertension/volume and secondary hyperparathyroidism  HPI: [name] is a 41 y.o. female admitted with severe abdominal pain / nausea and  vomiting after HD wed 06/22/12.Noted CT scan of abd and pelvis were unremarkable with wbc 16 and bs 400s. She was treated with subcutaneous insulin and hydrated and she left AGAINST MEDICAL ADVICE,"had to pick up her daughter'/however she returned via EMS with similar symptoms several hours later reporting pain "10 out of 10 and non focal" She denies fevers, chills, sweats, chest pain, sob, diarrhea,cough , dysuria , or weakness.  Her hd treatment was uneventful 06/22/12= 3.4 kg goal, except  Somewhat hypertensive with 150/95 bp pre and 184/100 post with 2.70 kg uf leaving at post wt 81.1 ( edw 81.0kg).   Now in room tolerated liquid breakfast, denies pain . For hd treatment today.     Past Medical History  Diagnosis Date  . GERD (gastroesophageal reflux disease)   . Diabetes mellitus     IDDM  . Hypertension     states has been on med. x "a long time"  . ESRF (end stage renal failure)     dialysis M,W, F; left thigh AV graft  . History of gangrene     left foot  . Hx MRSA infection   . 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 Av fistula placement 10/18/2008    right thigh AV graft  . Finger debridement 06/20/2010    right middle finger  . Finger amputation 05/27/2010    right middle  . Thrombectomy / arteriovenous graft revision 01/14/2010    left superficial femoral artery; left thigh AV graft placement  . 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 Leg amputation below knee 10/11/2009    right  . Central venous catheter insertion 08/06/2009    removal right IJ Diatek cath., placement left IJ Diatek cath.  . 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 Foot  amputation 04/04/2009    left transtibial amputation  . Excision / curettage bone cyst talus / calcaneus 03/04/2009    partial calcaneal exc. left; placement wound VAC  . Groin debridement 11/16/2008    right rectus femoris muscle flap to right groin wound; right groing debridement  . Femoral endarterectomy 11/06/2008    right common femoral; embolectomy right femoral artery; removal right femoral AV graft  . Central venous catheter insertion 07/10/2008; 12/31/2006; 01/19/2006; 06/19/2005; 09/19/2004; 09/08/2004    right IJ Diatek catheter  . Av fistula repair 07/08/2008    removal left upper arm AV graft  . Thrombectomy / arteriovenous graft revision 05/11/2007;12/31/2006; 01/18/2006; 12/16/2005    left upper arm  . Dialysis fistula creation 01/20/2007    left upper arm AV graft  . Dialysis fistula creation 01/28/2006    right upper arm AV graft  . Thrombectomy / arteriovenous graft revision 10/26/2005    left forearm AV graft  . Dialysis fistula creation 10/04/2005    left forearm AV graft  . Av fistula repair 07/14/2005    removal infected right forearm AV graft  . Thrombectomy / arteriovenous graft revision 03/11/2005; 10/20/2004    right forearm AV graft  . Dialysis fistula creation 09/10/2004    right forearm AV graft  . Av fistula placement 07/18/2004    creation left AV fistula, radial to cephalic  .  Finger exploration 07/13/2002    and repair left middle finger  . Carpal tunnel release 01/26/2012    Procedure: CARPAL TUNNEL RELEASE;  Surgeon: Nicki Reaper, MD;  Location: [facility];  Service: Orthopedics;  Laterality: Left;      Family History  Problem Relation Age of Onset  . Diabetes Mother   . Heart disease Mother   . Hypertension Mother   . Heart attack Mother 73  . Diabetes Sister   . Hypertension Brother   . Heart attack Brother    social= Lives at home with 24 year old daughter and sister lives down the street who helps her at home.She    reports that she has never  smoked. She has never used smokeless tobacco. She reports that she does not drink alcohol or use illicit drugs.   Allergies  Allergen Reactions  . Percocet (Oxycodone-Acetaminophen) Other (See Comments)    GI UPSET  . Soap Itching    IVORY SOAP    Prior to Admission medications   Medication Sig Start Date End Date Taking? Authorizing Provider  amitriptyline (ELAVIL) 50 MG tablet Take 50 mg by mouth at bedtime. 05/04/12  Yes Historical Provider, MD  amLODipine (NORVASC) 10 MG tablet Take 5 mg by mouth daily.   Yes Historical Provider, MD  aspirin EC 81 MG tablet Take 81 mg by mouth daily.   Yes Historical Provider, MD  calcium acetate (PHOSLO) 667 MG capsule Take 1,334 mg by mouth 3 (three) times daily with meals.    Yes Historical Provider, MD  gabapentin (NEURONTIN) 100 MG capsule Take 100 mg by mouth at bedtime. 05/12/12  Yes Historical Provider, MD  hydrOXYzine (ATARAX/VISTARIL) 25 MG tablet Take 25 mg by mouth 3 (three) times daily as needed. For itching   Yes Historical Provider, MD  insulin aspart protamine-insulin aspart (NOVOLOG 70/30) (70-30) 100 UNIT/ML injection Inject 6-8 Units into the skin 2 (two) times daily with a meal. Take 8 units in the morning and 6 units in the evening   Yes Historical Provider, MD  metoCLOPramide (REGLAN) 10 MG tablet Take 10 mg by mouth 3 (three) times daily as needed. For stomach upset   Yes Historical Provider, MD  moxifloxacin (AVELOX) 400 MG tablet Take 1 tablet (400 mg total) by mouth daily. 06/19/12  Yes Catarina Hartshorn, MD  multivitamin (RENA-VIT) TABS tablet Take 1 tablet by mouth at bedtime. 04/29/12  Yes Renae Fickle, MD  RENVELA 800 MG tablet Take 800 mg by mouth 3 (three) times daily with meals.  05/04/12  Yes Historical Provider, MD     Anti-infectives     Start     Dose/Rate Route Frequency Ordered Stop   06/24/12 1200   vancomycin (VANCOCIN) IVPB 1000 mg/200 mL premix  Status:  Discontinued        1,000 mg 200 mL/hr over 60 Minutes  Intravenous Every M-W-F (Hemodialysis) 06/23/12 1711 06/24/12 1100   06/24/12 1200   vancomycin (VANCOCIN) 750 mg in sodium chloride 0.9 % 150 mL IVPB        750 mg 150 mL/hr over 60 Minutes Intravenous Every M-W-F (Hemodialysis) 06/24/12 1100     06/23/12 1900   vancomycin (VANCOCIN) 2,000 mg in sodium chloride 0.9 % 500 mL IVPB        2,000 mg 250 mL/hr over 120 Minutes Intravenous  Once 06/23/12 1710 06/24/12 0137   06/23/12 1800   vancomycin (VANCOCIN) 2,000 mg in sodium chloride 0.9 % 500 mL IVPB  Status:  Discontinued        2,000 mg 250 mL/hr over 120 Minutes Intravenous  Once 06/23/12 1708 06/23/12 1710   06/23/12 1800   ceFEPIme (MAXIPIME) 2 g in dextrose 5 % 50 mL IVPB        2 g 100 mL/hr over 30 Minutes Intravenous Every M-W-F (Hemodialysis) 06/23/12 1710            Results for orders placed during the hospital encounter of 06/23/12 (from the past 48 hour(s))  CBC WITH DIFFERENTIAL     Status: Abnormal   Collection Time   06/23/12  5:50 AM      Component Value Range Comment   WBC 16.6 (*) 4.0 - 10.5 K/uL    RBC 2.77 (*) 3.87 - 5.11 MIL/uL    Hemoglobin 8.8 (*) 12.0 - 15.0 g/dL    HCT 16.1 (*) 09.6 - 46.0 %    MCV 93.5  78.0 - 100.0 fL    MCH 31.8  26.0 - 34.0 pg    MCHC 34.0  30.0 - 36.0 g/dL    RDW 04.5 (*) 40.9 - 15.5 %    Platelets 248  150 - 400 K/uL    Neutrophils Relative 65  43 - 77 %    Neutro Abs 10.8 (*) 1.7 - 7.7 K/uL    Lymphocytes Relative 22  12 - 46 %    Lymphs Abs 3.7  0.7 - 4.0 K/uL    Monocytes Relative 13 (*) 3 - 12 %    Monocytes Absolute 2.1 (*) 0.1 - 1.0 K/uL    Eosinophils Relative 0  0 - 5 %    Eosinophils Absolute 0.0  0.0 - 0.7 K/uL    Basophils Relative 0  0 - 1 %    Basophils Absolute 0.0  0.0 - 0.1 K/uL   COMPREHENSIVE METABOLIC PANEL     Status: Abnormal   Collection Time   06/23/12  5:50 AM      Component Value Range Comment   Sodium 133 (*) 135 - 145 mEq/L    Potassium 4.1  3.5 - 5.1 mEq/L HEMOLYSIS AT THIS LEVEL MAY  AFFECT RESULT   Chloride 92 (*) 96 - 112 mEq/L    CO2 27  19 - 32 mEq/L    Glucose, Bld 417 (*) 70 - 99 mg/dL    BUN 21  6 - 23 mg/dL    Creatinine, Ser 8.11 (*) 0.50 - 1.10 mg/dL    Calcium 9.3  8.4 - 91.4 mg/dL    Total Protein 7.2  6.0 - 8.3 g/dL    Albumin 3.0 (*) 3.5 - 5.2 g/dL    AST 55 (*) 0 - 37 U/L    ALT 14  0 - 35 U/L    Alkaline Phosphatase 75  39 - 117 U/L    Total Bilirubin 2.3 (*) 0.3 - 1.2 mg/dL    GFR calc non Af Amer 10 (*) >90 mL/min    GFR calc Af Amer 11 (*) >90 mL/min   LACTIC ACID, PLASMA     Status: Normal   Collection Time   06/23/12  5:50 AM      Component Value Range Comment   Lactic Acid, Venous 2.2  0.5 - 2.2 mmol/L   LIPASE, BLOOD     Status: Normal   Collection Time   06/23/12  5:50 AM      Component Value Range Comment   Lipase 47  11 - 59 U/L   GLUCOSE, CAPILLARY  Status: Abnormal   Collection Time   06/23/12  9:15 AM      Component Value Range Comment   Glucose-Capillary 309 (*) 70 - 99 mg/dL   GLUCOSE, CAPILLARY     Status: Abnormal   Collection Time   06/23/12 10:11 AM      Component Value Range Comment   Glucose-Capillary 268 (*) 70 - 99 mg/dL   GLUCOSE, CAPILLARY     Status: Abnormal   Collection Time   06/23/12 11:29 AM      Component Value Range Comment   Glucose-Capillary 176 (*) 70 - 99 mg/dL   CBC     Status: Abnormal   Collection Time   06/23/12  2:23 PM      Component Value Range Comment   WBC 16.4 (*) 4.0 - 10.5 K/uL WHITE COUNT CONFIRMED ON SMEAR   RBC 2.45 (*) 3.87 - 5.11 MIL/uL    Hemoglobin 8.0 (*) 12.0 - 15.0 g/dL    HCT 47.8 (*) 29.5 - 46.0 %    MCV 96.3  78.0 - 100.0 fL    MCH 32.7  26.0 - 34.0 pg    MCHC 33.9  30.0 - 36.0 g/dL    RDW 62.1 (*) 30.8 - 15.5 %    Platelets 210  150 - 400 K/uL   CREATININE, SERUM     Status: Abnormal   Collection Time   06/23/12  2:23 PM      Component Value Range Comment   Creatinine, Ser 5.51 (*) 0.50 - 1.10 mg/dL    GFR calc non Af Amer 9 (*) >90 mL/min    GFR calc Af  Amer 10 (*) >90 mL/min   GLUCOSE, CAPILLARY     Status: Normal   Collection Time   06/23/12  4:30 PM      Component Value Range Comment   Glucose-Capillary 81  70 - 99 mg/dL    Comment 1 Notify RN      Comment 2 Documented in Chart     GLUCOSE, CAPILLARY     Status: Abnormal   Collection Time   06/23/12  8:18 PM      Component Value Range Comment   Glucose-Capillary 223 (*) 70 - 99 mg/dL   GLUCOSE, CAPILLARY     Status: Abnormal   Collection Time   06/24/12 12:24 AM      Component Value Range Comment   Glucose-Capillary 225 (*) 70 - 99 mg/dL   GLUCOSE, CAPILLARY     Status: Abnormal   Collection Time   06/24/12  4:17 AM      Component Value Range Comment   Glucose-Capillary 241 (*) 70 - 99 mg/dL   BASIC METABOLIC PANEL     Status: Abnormal   Collection Time   06/24/12  6:25 AM      Component Value Range Comment   Sodium 134 (*) 135 - 145 mEq/L    Potassium 5.3 (*) 3.5 - 5.1 mEq/L    Chloride 95 (*) 96 - 112 mEq/L    CO2 23  19 - 32 mEq/L    Glucose, Bld 196 (*) 70 - 99 mg/dL    BUN 35 (*) 6 - 23 mg/dL    Creatinine, Ser 6.57 (*) 0.50 - 1.10 mg/dL    Calcium 9.6  8.4 - 84.6 mg/dL    GFR calc non Af Amer 7 (*) >90 mL/min    GFR calc Af Amer 8 (*) >90 mL/min   CBC     Status:  Abnormal   Collection Time   06/24/12  6:25 AM      Component Value Range Comment   WBC 18.3 (*) 4.0 - 10.5 K/uL    RBC 2.31 (*) 3.87 - 5.11 MIL/uL    Hemoglobin 7.3 (*) 12.0 - 15.0 g/dL    HCT 16.1 (*) 09.6 - 46.0 %    MCV 93.1  78.0 - 100.0 fL    MCH 31.6  26.0 - 34.0 pg    MCHC 34.0  30.0 - 36.0 g/dL    RDW 04.5 (*) 40.9 - 15.5 %    Platelets 258  150 - 400 K/uL   MRSA PCR SCREENING     Status: Abnormal   Collection Time   06/24/12  6:31 AM      Component Value Range Comment   MRSA by PCR POSITIVE (*) NEGATIVE   GLUCOSE, CAPILLARY     Status: Abnormal   Collection Time   06/24/12  8:15 AM      Component Value Range Comment   Glucose-Capillary 141 (*) 70 - 99 mg/dL   GLUCOSE, CAPILLARY      Status: Normal   Collection Time   06/24/12 11:29 AM      Component Value Range Comment   Glucose-Capillary 79  70 - 99 mg/dL     ROS: see hpi   Physical Exam: Filed Vitals:   06/24/12 0936  BP: 133/78  Pulse: 96  Temp: 98 F (36.7 C)  Resp: 18     General: Alert, NAD, Appropriate Obese, BF HEENT: [place], mmm Eyes: eomi Neck: no jvd Heart:RRR, no rub,  1/6 sem lsb Lungs: CTA bilaterally Abdomen: obese, bs =+, soft, nontender Extremities: bilat. AKAs Skin: AKA stumps  No rash or wounds Neuro: alert, OX3, moves all extrmities Dialysis Access: pos. Bruit L femoral avgg  Dialysis Orders: Center: [facility]  on MWF . EDW 81.0 kg HD Bath 2.0 k, ca 2.25  Time 4hrs Heparin 7ml. Access Left Fem Avgg BFR 400 DFR 800    Zemplar 6 mcg IV/HD Epogen 18000   Units IV/HD  Infed 100 mg q hd   Until 06/27/12 then weekly 100 mg on hd Other prostat q hd  Assessment/Plan 1. Abd pain/N/V, inc'd BS 400's- improved. Transient hypotensive/ ischemic event post hd. Would be concerned about recurrent bacteremia (see #7 below), since she just came of a long course of abx on 10/8. Blood cultures are pending from 10/24.  2. ESRD -  mwf ( [facility]), hd today fu wts and bp 3. Hypertension/volume  - stable now pre hd 133/78/ Norvasc 5mg  hs/ wt 82.9 kg pre hd today. Attempt 2 liters uf 4. Anemia  - hgb 6.9, aranesp 200 max and infed. Was admitted 10/18 - 10/20 and received 3u prbc's for Hb 5.1. Work up showed guiac neg stool; EGD and colon could not be done as pt didn't tolerated prep and plans were made for outpatient endsocopy. Hb was 8.2 at discharge. Will transfuse 2u prbc with HD today if pt agreeable.  5. .Metabolic bone disease -  Zemplar on hd  And binders 6. IDDM type 2 = Hyperglycemia on admit  Now 196 bs 7. Acinetobacter bacteremia, Aug 2013- on 8/25 patient presented with AVG bleed. The surgeon's removed a piece of her graft that day, not for infection but due to pseudoaneursym and bleeding problems. The  graft was not grossly infected per OP note. She was discharged but returned a day or two later with [facility] 20-30K, GI symptoms  and1/2 blood cx's from the 8/25 viisit coming back + for acinetobacter baumanii.  ID saw her and recommended 6 weeks of IV vanc and fortaz and 6 wks of po rifampin for suspected AVG infection. No more graft surgery was done. She was discharged on 8/30. She finished the antibiotics on 10/8.  WBC at time of admission here is 14-16K. AVG does not show gross signs of infection on exam.   8.   Lenny Pastel, PA-C [facility] [phone] 06/24/2012, 12:41 PM   Patient seen and examined and agree with assessment and plan as above with additions as indicated.  Vinson Moselle  MD [facility] [phone] pgr    [phone] cell 06/24/2012, 2:44 PM

## 2012-06-24 NOTE — Progress Notes (Signed)
[facility] PROGRESS NOTE  [name] ZOX:[id number] DOB: 1971-04-07 DOA: 06/23/2012 PCP: Lieutenant Diego, MD  Assessment/Plan: 1. Sever Abdominal pain on admission - ? Etiology ? transient hypotensive/ ischemic event post hd. CT abdomen and pelvis WNL. Abdominal pain resolved by 10/25  2. ESRD - mwf ( [facility]), nephrology following  3. Chronic Anemia with recent episode of worsening anemia  Was admitted 10/18 - 10/20 and received 3u prbc's for Hb 5.1. Work up showed guiac neg stool; EGD and colon could not be done as pt didn't tolerated prep and plans were made for outpatient endsocopy. Hb was 8.2 at discharge. Patient received 2u prbc with HD 10/25  4. IDDM type 2 = Hyperglycemia on admit. Patient on 70/30 BID at home - on SSI Novolog in house.  5. Recent Acinetobacter bacteremia, Aug 2013- on 8/25 patient presented with AVG bleed. The surgeon's removed a piece of her graft that day, not for infection but due to pseudoaneursym and bleeding problems. The graft was not grossly infected per OP note. She was discharged but returned a day or two later with [facility] 20-30K, GI symptoms and1/2 blood cx's from the 8/25 viisit coming back + for acinetobacter baumanii. ID saw her and recommended 6 weeks of IV vanc and fortaz and 6 wks of po rifampin for suspected AVG infection. No more graft surgery was done. She was discharged on 8/30. She finished the antibiotics on 10/8. WBC at time of admission here is 14-16K. AVG does not show gross signs of infection on exam.   Code Status: full Family Communication: patient  Disposition Plan: home   Consultants:  [facility]  Procedures:  HD  Antibiotics: None  HPI/Subjective: Wants food  Objective: Filed Vitals:   06/23/12 1500 06/23/12 1827 06/23/12 2014 06/24/12 0414  BP:  129/74 181/94 128/75  Pulse:  77 95 112  Temp:  97.3 F (36.3 C) 98.4 F (36.9 C) 98.3 F (36.8 C)  TempSrc:  Oral Oral Oral  Resp:  18 16   Height: 5\' 3"  (1.6 m)       Weight: 83.901 kg (184 lb 15.5 oz)     SpO2:  100% 92% 95%   No intake or output data in the 24 hours ending 06/24/12 0838 Filed Weights   06/23/12 1500  Weight: 83.901 kg (184 lb 15.5 oz)    Exam:   General:  Asleep but wakes up and talks   Cardiovascular: RRR  Respiratory: CTAB  Abdomen: soft and with minimal tenderness   Data Reviewed: Basic Metabolic Panel:  Lab 06/24/12 8119 06/23/12 1423 06/23/12 0550 06/22/12 2018 06/18/12 0505 06/17/12 1445 06/17/12 1052  NA 134* -- 133* 137 137 138 --  K 5.3* -- 4.1 4.1 4.1 3.8 --  CL 95* -- 92* 96 98 96 --  CO2 23 -- 27 29 27  -- 30  GLUCOSE 196* -- 417* 422* 230* 264* --  BUN 35* -- 21 14 17 22  --  CREATININE 6.75* 5.51* 5.02* 4.26* 3.70* -- --  CALCIUM 9.6 -- 9.3 9.0 8.7 -- 9.1  MG -- -- -- -- -- -- --  PHOS -- -- -- -- -- -- --   Liver Function Tests:  Lab 06/23/12 0550 06/22/12 2018  AST 55* 44*  ALT 14 14  ALKPHOS 75 78  BILITOT 2.3* 2.1*  PROT 7.2 7.2  ALBUMIN 3.0* 3.0*    Lab 06/23/12 0550 06/22/12 2018  LIPASE 47 42  AMYLASE -- --   No results found for this basename: AMMONIA:5  in the last 168 hours CBC:  Lab 06/24/12 0625 06/23/12 1423 06/23/12 0550 06/22/12 2018 06/19/12 0500  WBC 18.3* 16.4* 16.6* 16.8* 8.4  NEUTROABS -- -- 10.8* 11.9* --  HGB 7.3* 8.0* 8.8* 9.2* 8.2*  HCT 21.5* 23.6* 25.9* 27.6* 24.6*  MCV 93.1 96.3 93.5 93.2 92.5  PLT 258 210 248 241 260   Cardiac Enzymes:  Lab 06/22/12 2326  CKTOTAL --  CKMB --  CKMBINDEX --  TROPONINI <0.30   BNP (last 3 results) No results found for this basename: PROBNP:3 in the last 8760 hours CBG:  Lab 06/24/12 0815 06/24/12 0417 06/24/12 0024 06/23/12 2018 06/23/12 1630  GLUCAP 141* 241* 225* 223* 81    Recent Results (from the past 240 hour(s))  CULTURE, BLOOD (ROUTINE X 2)     Status: Normal   Collection Time   06/17/12  6:00 PM      Component Value Range Status Comment   Specimen Description BLOOD HAND RIGHT   Final    Special  Requests BOTTLES DRAWN AEROBIC ONLY 5CC   Final    Culture  Setup Time 06/18/2012 01:38   Final    Culture NO GROWTH 5 DAYS   Final    Report Status 06/24/2012 FINAL   Final   MRSA PCR SCREENING     Status: Abnormal   Collection Time   06/24/12  6:31 AM      Component Value Range Status Comment   MRSA by PCR POSITIVE (*) NEGATIVE Final      Studies: Dg Chest 2 View  06/22/2012  *RADIOLOGY REPORT*  Clinical Data: Abdominal pain and emesis.  CHEST - 2 VIEW  Comparison: 06/17/2012  Findings: Two views of the chest were obtained.  Stable appearance of the heart and mediastinum.  Again noted are patchy densities at both lung bases, left side greater than right.  The findings have not significantly changed and there may be a chronic component. Upper lungs are clear.  Trachea is midline.  IMPRESSION: Persistent bibasilar densities, left side greater than right. Findings could represent acute or chronic changes.  Minimal change from the prior examination.   Original Report Authenticated By: Richarda Overlie, M.D.    Ct Abdomen Pelvis W Contrast  06/23/2012  *RADIOLOGY REPORT*  Clinical Data: Emesis and abdominal pain.  End-stage renal disease and hemodialysis.  CT ABDOMEN AND PELVIS WITH CONTRAST  Technique:  Multidetector CT imaging of the abdomen and pelvis was performed following the standard protocol during bolus administration of intravenous contrast.  Contrast: 80mL OMNIPAQUE IOHEXOL 300 MG/ML  SOLN  Comparison: CT 04/24/2012  Findings: There are patchy parenchymal densities at the lung bases. This could represent chronic changes and atelectasis.  There is no evidence for free intraperitoneal air.  The patient has a left femoral dialysis AV graft.  No gross abnormality to the liver, portal venous system or gallbladder. There is a normal appearance of the spleen, pancreas and adrenal glands.  Both kidneys are atrophic.  Evidence for bilateral renal cortical cysts and punctate renal calcifications which may  be vascular.  There are extensive calcifications involving the visceral arteries.  Normal caliber to the abdominal aorta.  The inferior vena cava and iliac vessels appear to be patent.  A calcified fibroid along the right side of the uterus.  No significant free fluid or lymphadenopathy.  No acute bony abnormality.  IMPRESSION: No acute abnormalities within the abdomen or pelvis.  Patchy densities in the lung bases could represent scarring and/or atelectasis.  Renal  atrophy and renal cortical cysts.  Findings are compatible with end-stage renal disease.   Original Report Authenticated By: Richarda Overlie, M.D.     Scheduled Meds:   . amitriptyline  50 mg Oral QHS  . amLODipine  5 mg Oral Daily  . aspirin EC  81 mg Oral Daily  . calcium acetate  1,334 mg Oral TID WC  . ceFEPime (MAXIPIME) IV  2 g Intravenous Q M,W,F-HD  . Chlorhexidine Gluconate Cloth  6 each Topical Q0600  . enoxaparin (LOVENOX) injection  30 mg Subcutaneous Q24H  . gabapentin  100 mg Oral QHS  . insulin aspart  0-15 Units Subcutaneous Q4H  . insulin aspart  20 Units Subcutaneous Once  . multivitamin  1 tablet Oral QHS  . mupirocin ointment  1 application Nasal BID  . ondansetron (ZOFRAN) IV  4 mg Intravenous Once  . sevelamer  800 mg Oral TID WC  . sodium chloride  3 mL Intravenous Q12H  . vancomycin  2,000 mg Intravenous Once  . vancomycin  1,000 mg Intravenous Q M,W,F-HD  . DISCONTD: sodium chloride   Intravenous Once  . DISCONTD: insulin aspart  0-9 Units Subcutaneous Q4H  . DISCONTD: vancomycin  2,000 mg Intravenous Once   Continuous Infusions:   Principal Problem:  *Hyperglycemia Active Problems:  End stage renal disease  Leucocytosis  Diabetes mellitus  Anemia  MRSA infection       Aulton Routt  [facility] Pager [phone]. If 8PM-8AM, please contact night-coverage at [email or web address], password [facility] 06/24/2012, 8:38 AM  LOS: 1 day

## 2012-06-25 DIAGNOSIS — E119 Type 2 diabetes mellitus without complications: Secondary | ICD-10-CM

## 2012-06-25 LAB — CBC
HCT: 25.9 % — ABNORMAL LOW (ref 36.0–46.0)
MCHC: 35.1 g/dL (ref 30.0–36.0)
MCV: 95.6 fL (ref 78.0–100.0)
RDW: 19.2 % — ABNORMAL HIGH (ref 11.5–15.5)

## 2012-06-25 LAB — GLUCOSE, CAPILLARY
Glucose-Capillary: 313 mg/dL — ABNORMAL HIGH (ref 70–99)
Glucose-Capillary: 266 mg/dL — ABNORMAL HIGH (ref 70–99)
Glucose-Capillary: 195 mg/dL — ABNORMAL HIGH (ref 70–99)
Glucose-Capillary: 234 mg/dL — ABNORMAL HIGH (ref 70–99)
Glucose-Capillary: 256 mg/dL — ABNORMAL HIGH (ref 70–99)

## 2012-06-25 LAB — BASIC METABOLIC PANEL
BUN: 29 mg/dL — ABNORMAL HIGH (ref 6–23)
CO2: 26 mEq/L (ref 19–32)
Chloride: 97 mEq/L (ref 96–112)
Creatinine, Ser: 3.91 mg/dL — ABNORMAL HIGH (ref 0.50–1.10)

## 2012-06-25 LAB — TYPE AND SCREEN
Antibody Screen: NEGATIVE
Unit division: 0

## 2012-06-25 MED ORDER — INSULIN ASPART 100 UNIT/ML ~~LOC~~ SOLN
0.0000 [IU] | Freq: Three times a day (TID) | SUBCUTANEOUS | Status: DC
  Administered 2012-06-25: 5 [IU] via SUBCUTANEOUS
  Administered 2012-06-25: 3 [IU] via SUBCUTANEOUS
  Administered 2012-06-26: 15 [IU] via SUBCUTANEOUS

## 2012-06-25 MED ORDER — AMITRIPTYLINE HCL 10 MG PO TABS
10.0000 mg | ORAL_TABLET | Freq: Every day | ORAL | Status: DC
  Administered 2012-06-25: 10 mg via ORAL
  Filled 2012-06-25 (×2): qty 1

## 2012-06-25 MED ORDER — INSULIN ASPART 100 UNIT/ML ~~LOC~~ SOLN
2.0000 [IU] | Freq: Three times a day (TID) | SUBCUTANEOUS | Status: DC
  Administered 2012-06-25 – 2012-06-26 (×3): 2 [IU] via SUBCUTANEOUS

## 2012-06-25 MED ORDER — PANTOPRAZOLE SODIUM 40 MG IV SOLR
40.0000 mg | INTRAVENOUS | Status: DC
  Administered 2012-06-25: 40 mg via INTRAVENOUS
  Filled 2012-06-25 (×2): qty 40

## 2012-06-25 NOTE — Progress Notes (Signed)
Subjective:  Continued abdominal pain, sl lethargic this am, given Dilaudid at 5 AM, HD yesterday tolerated Objective Vital signs in last 24 hours: Filed Vitals:   06/24/12 1901 06/24/12 2148 06/25/12 0500 06/25/12 0935  BP: 159/80 172/87 196/98 137/82  Pulse: 116 108 108 108  Temp: 99.9 F (37.7 C) 98.4 F (36.9 C) 99.1 F (37.3 C) 98 F (36.7 C)  TempSrc: Oral Oral Oral Oral  Resp: 14 18 18 18   Height:  5\' 3"  (1.6 m)    Weight:  81.7 kg (180 lb 1.9 oz)    SpO2: 100% 99% 100% 100%   Weight change: -1.001 kg (-2 lb 3.3 oz)  Intake/Output Summary (Last 24 hours) at 06/25/12 0936 Last data filed at 06/25/12 0900  Gross per 24 hour  Intake    590 ml  Output   2600 ml  Net  -2010 ml   Labs: Basic Metabolic Panel:  Lab 06/25/12 6045 06/24/12 1319 06/24/12 0625  NA 136 134* 134*  K 4.5 4.4 5.3*  CL 97 94* 95*  CO2 26 28 23   GLUCOSE 311* 136* 196*  BUN 29* 37* 35*  CREATININE 3.91* 7.13* 6.75*  CALCIUM 9.3 9.5 9.6  ALB -- -- --  PHOS -- 5.1* --   Liver Function Tests:  Lab 06/24/12 1319 06/23/12 0550 06/22/12 2018  AST -- 55* 44*  ALT -- 14 14  ALKPHOS -- 75 78  BILITOT -- 2.3* 2.1*  PROT -- 7.2 7.2  ALBUMIN 2.6* 3.0* 3.0*    Lab 06/23/12 0550 06/22/12 2018  LIPASE 47 42  AMYLASE -- --   No results found for this basename: AMMONIA:3 in the last 168 hours CBC:  Lab 06/25/12 0612 06/24/12 1319 06/24/12 0625 06/23/12 1423 06/23/12 0550 06/22/12 2018  WBC 16.9* 14.3* 18.3* -- -- --  NEUTROABS -- -- -- -- 10.8* 11.9*  HGB 9.1* 6.9* 7.3* -- -- --  HCT 25.9* 20.6* 21.5* -- -- --  MCV 95.6 95.4 93.1 96.3 93.5 --  PLT 211 272 258 -- -- --   Cardiac Enzymes:  Lab 06/22/12 2326  CKTOTAL --  CKMB --  CKMBINDEX --  TROPONINI <0.30   CBG:  Lab 06/25/12 0507 06/24/12 2138 06/24/12 1900 06/24/12 1129 06/24/12 0815  GLUCAP 313* 249* 197* 79 141*    Iron Studies: No results found for this basename: IRON,TIBC,TRANSFERRIN,FERRITIN in the last 72  hours Studies/Results: No results found. Medications:      . amitriptyline  10 mg Oral QHS  . amLODipine  5 mg Oral Daily  . aspirin EC  81 mg Oral Daily  . calcium acetate  1,334 mg Oral TID WC  . ceFEPime (MAXIPIME) IV  2 g Intravenous Q M,W,F-HD  . Chlorhexidine Gluconate Cloth  6 each Topical Q0600  . darbepoetin (ARANESP) injection - DIALYSIS  200 mcg Intravenous Q Fri-HD  . enoxaparin (LOVENOX) injection  30 mg Subcutaneous Q24H  . gabapentin  100 mg Oral QHS  . insulin aspart  0-15 Units Subcutaneous TID WC  . insulin aspart  2 Units Subcutaneous TID WC  . iron dextran (INFED/DEXFERRUM) infusion  100 mg Intravenous Q M,W,F-HD  . iron dextran (INFED/DEXFERRUM) infusion  100 mg Intravenous Q Mon-HD  . multivitamin  1 tablet Oral QHS  . mupirocin ointment  1 application Nasal BID  . ondansetron (ZOFRAN) IV  4 mg Intravenous Once  . pantoprazole (PROTONIX) IV  40 mg Intravenous Q24H  . paricalcitol  6 mcg Intravenous 3 times weekly  .  sevelamer  800 mg Oral TID WC  . sodium chloride  3 mL Intravenous Q12H  . vancomycin  750 mg Intravenous Q M,W,F-HD  . DISCONTD: amitriptyline  50 mg Oral QHS  . DISCONTD: insulin aspart  0-15 Units Subcutaneous Q4H  . DISCONTD: iron dextran (INFED/DEXFERRUM) infusion  100 mg Intravenous Q Mon-HD  . DISCONTD: vancomycin  1,000 mg Intravenous Q M,W,F-HD   I  have reviewed scheduled and prn medications.  Physical Exam: General:Lethargic  But when awoken  OX3, NAD Heart:RRR, no rub, 1/6 sem lsb  Lungs: poor resp effort but CTA bilaterally  Abdomen: obese, bs =+, soft, slightly tender epigastric area, no rebound Extremities: bilat. AKAs , no edema Dialysis Access: pos. Bruit L femoral avgg  Dialysis Orders: Center: [facility] on MWF .  EDW 81.0 kg HD Bath 2.0 k, ca 2.25 Time 4hrs Heparin 7ml. Access Left Fem Avgg BFR 400 DFR 800  Zemplar 6 mcg IV/HD Epogen 18000 Units IV/HD Infed 100 mg q hd Until 06/27/12 then weekly 100 mg on hd  Other prostat q  hd  Problem/Plan: 1. Abd pain= ?? Transient hypotensive/ ischemic event post hd.?? Dr. Lavera Guise consulting GI  06/23/12 bcx's final pending 2. ESRD - mwf ( [facility]), hd yesterday to post wt 80.3( below edw) 2600uf  And htn  Needs further uf next hd mon   100% o@ sat this am 3. Hypertension/volume - am bp= 137/82/ on Norvasc 5mg  hs/  As above uf further next hd 4. Anemia - hgb 6.9  To  9.1 this am after 2 uprbcs on hd (10/25) yesterday on max aranesp 200  and infed../  as noted in yesterday note  recent Anemia admit 10/ 18/13 with hgb 5.1 with guaiac neg  stool to have GI  eval  This admit as DW  Dr. Lavera Guise.  5. .Metabolic bone disease - Zemplar on hd And binders( Renvela and Phoslo)  Recheck  Am labs may not need both in hosp,yesterday=  Phos 5.1 and Ca 10.7 ~corrected 6. IDDM type 2 = Hyperglycemia on admit Now 311 bs 7. Acinetobacter bacteremia, Aug 2013- on 04/24/12   = Pt now on Maxipime and Vancomycin//With prior admit on 8/25 patient presented with AVG bleed. The surgeon's removed a piece of her graft that day, not for infection but due to pseudoaneursym and bleeding problems. The graft was not grossly infected per OP note. She was discharged but readmitted a day later with [facility] 20-30K and GI symptoms. Acinetobacter grew in 1 of 2 blood cx's from 8/25, the day of surgery . ID saw her and recommended 6 weeks of IV vanc and fortaz and 6 wks of po rifampin for suspected AVG infection. No more graft surgery was done. She was discharged on 8/30. She finished the antibiotics on 10/8, 2 1/2 weeks ago. WBC at time of admission here is 14-16K. AVG does not show gross signs of infection on exam   Lenny Pastel, PA-C [facility] Beeper [phone] 06/25/2012,9:36 AM  LOS: 2 days   Patient seen and examined and agree with assessment and plan as above.  Vinson Moselle  MD [facility] [phone] pgr    [phone] cell 06/25/2012, 11:17 AM

## 2012-06-25 NOTE — Progress Notes (Signed)
[facility] PROGRESS NOTE  COLLEN HOSTLER WUJ:[id number] DOB: [birth year] DOA: 06/23/2012 PCP: Lieutenant Diego, MD  Assessment/Plan: 1. Severe Abdominal pain on admission - ? Etiology ? transient hypotensive/ ischemic event post hd. CT abdomen and pelvis WNL. Severe Abdominal pain resolved by 10/25 . Patient still with some epigastric pain by 10/26. She is regurgitating clear frothy material - from stomach - ? GERD vs PUd vs gastritis. D/w with Dr. Loreta Ave and she will see patient for possible EGD. Started Protonix iv 10/26 .  2. ESRD - mwf ( [facility]), nephrology following  3. Chronic Anemia with recent episode of worsening anemia  Was admitted 10/18 - 10/20 and received 3u prbc's for Hb 5.1. Work up showed guiac neg stool; EGD and colon could not be done as pt didn't tolerated prep and plans were made for outpatient endsocopy. Hb was 8.2 at discharge. Upon return to hospital on 10/24 hemoglobin was down again to 6.9  Patient received 2u prbc with HD 10/25 Hemoglobin at 9.1 on 10/26 . No overt signs of bleeding.  4. IDDM type 2 = Hyperglycemia on admit. Patient on 70/30 BID at home - on SSI Novolog in house.  5. Recent Acinetobacter bacteremia, Aug 2013- on 8/25 patient presented with AVG bleed. The surgeon's removed a piece of her graft that day, not for infection but due to pseudoaneursym and bleeding problems. The graft was not grossly infected per OP note. She was discharged but returned a day or two later with [facility] 20-30K, GI symptoms and1/2 blood cx's from the 8/25 viisit coming back + for acinetobacter baumanii. ID saw her and recommended 6 weeks of IV vanc and fortaz and 6 wks of po rifampin for suspected AVG infection. No more graft surgery was done. She was discharged on 8/30. She finished the antibiotics on 10/8. WBC at time of admission here is 14-16K. AVG does not show gross signs of infection on exam. Due to leukocytosis and pulmonary infiltrates patient was started back on empiric abx this  admission from 10/24 on Vanc and Zosyn  Code Status: full Family Communication: patient  Disposition Plan: home   Consultants:  [facility]  Procedures:  HD  Antibiotics: Vanc 10/24 >> Cefepime 10/24 >> HPI/Subjective: Lethargic from dilaudid. C/o not feeling well   Objective: Filed Vitals:   06/24/12 1805 06/24/12 1901 06/24/12 2148 06/25/12 0500  BP: 179/100 159/80 172/87 196/98  Pulse: 117 116 108 108  Temp: 98.7 F (37.1 C) 99.9 F (37.7 C) 98.4 F (36.9 C) 99.1 F (37.3 C)  TempSrc: Oral Oral Oral Oral  Resp: 18 14 18 18   Height:   5\' 3"  (1.6 m)   Weight: 80.3 kg (177 lb 0.5 oz)  81.7 kg (180 lb 1.9 oz)   SpO2: 97% 100% 99% 100%    Intake/Output Summary (Last 24 hours) at 06/25/12 0925 Last data filed at 06/24/12 1805  Gross per 24 hour  Intake    350 ml  Output   2600 ml  Net  -2250 ml   Filed Weights   06/24/12 1308 06/24/12 1805 06/24/12 2148  Weight: 82.9 kg (182 lb 12.2 oz) 80.3 kg (177 lb 0.5 oz) 81.7 kg (180 lb 1.9 oz)    Exam:   General:  Asleep but wakes up and talks for a few seconds. Able to follow commands. obese  Cardiovascular: RRR  Respiratory: CTAB, very poor air movement  Abdomen: soft and with minimal tenderness in the epigastric area, quiet   Data Reviewed: Basic Metabolic Panel:  Lab 06/25/12 0612 06/24/12 1319 06/24/12 0625 06/23/12 1423 06/23/12 0550 06/22/12 2018  NA 136 134* 134* -- 133* 137  K 4.5 4.4 5.3* -- 4.1 4.1  CL 97 94* 95* -- 92* 96  CO2 26 28 23  -- 27 29  GLUCOSE 311* 136* 196* -- 417* 422*  BUN 29* 37* 35* -- 21 14  CREATININE 3.91* 7.13* 6.75* 5.51* 5.02* --  CALCIUM 9.3 9.5 9.6 -- 9.3 9.0  MG -- -- -- -- -- --  PHOS -- 5.1* -- -- -- --   Liver Function Tests:  Lab 06/24/12 1319 06/23/12 0550 06/22/12 2018  AST -- 55* 44*  ALT -- 14 14  ALKPHOS -- 75 78  BILITOT -- 2.3* 2.1*  PROT -- 7.2 7.2  ALBUMIN 2.6* 3.0* 3.0*    Lab 06/23/12 0550 06/22/12 2018  LIPASE 47 42  AMYLASE -- --   No results  found for this basename: AMMONIA:5 in the last 168 hours CBC:  Lab 06/25/12 0612 06/24/12 1319 06/24/12 0625 06/23/12 1423 06/23/12 0550 06/22/12 2018  WBC 16.9* 14.3* 18.3* 16.4* 16.6* --  NEUTROABS -- -- -- -- 10.8* 11.9*  HGB 9.1* 6.9* 7.3* 8.0* 8.8* --  HCT 25.9* 20.6* 21.5* 23.6* 25.9* --  MCV 95.6 95.4 93.1 96.3 93.5 --  PLT 211 272 258 210 248 --   Cardiac Enzymes:  Lab 06/22/12 2326  CKTOTAL --  CKMB --  CKMBINDEX --  TROPONINI <0.30   BNP (last 3 results) No results found for this basename: PROBNP:3 in the last 8760 hours CBG:  Lab 06/25/12 0507 06/24/12 2138 06/24/12 1900 06/24/12 1129 06/24/12 0815  GLUCAP 313* 249* 197* 79 141*    Recent Results (from the past 240 hour(s))  CULTURE, BLOOD (ROUTINE X 2)     Status: Normal   Collection Time   06/17/12  6:00 PM      Component Value Range Status Comment   Specimen Description BLOOD HAND RIGHT   Final    Special Requests BOTTLES DRAWN AEROBIC ONLY 5CC   Final    Culture  Setup Time 06/18/2012 01:38   Final    Culture NO GROWTH 5 DAYS   Final    Report Status 06/24/2012 FINAL   Final   MRSA PCR SCREENING     Status: Abnormal   Collection Time   06/24/12  6:31 AM      Component Value Range Status Comment   MRSA by PCR POSITIVE (*) NEGATIVE Final      Studies: No results found.  Scheduled Meds:    . amitriptyline  10 mg Oral QHS  . amLODipine  5 mg Oral Daily  . aspirin EC  81 mg Oral Daily  . calcium acetate  1,334 mg Oral TID WC  . ceFEPime (MAXIPIME) IV  2 g Intravenous Q M,W,F-HD  . Chlorhexidine Gluconate Cloth  6 each Topical Q0600  . darbepoetin (ARANESP) injection - DIALYSIS  200 mcg Intravenous Q Fri-HD  . enoxaparin (LOVENOX) injection  30 mg Subcutaneous Q24H  . gabapentin  100 mg Oral QHS  . insulin aspart  0-15 Units Subcutaneous Q4H  . iron dextran (INFED/DEXFERRUM) infusion  100 mg Intravenous Q M,W,F-HD  . iron dextran (INFED/DEXFERRUM) infusion  100 mg Intravenous Q Mon-HD  .  multivitamin  1 tablet Oral QHS  . mupirocin ointment  1 application Nasal BID  . ondansetron (ZOFRAN) IV  4 mg Intravenous Once  . pantoprazole (PROTONIX) IV  40 mg Intravenous Q24H  .  paricalcitol  6 mcg Intravenous 3 times weekly  . sevelamer  800 mg Oral TID WC  . sodium chloride  3 mL Intravenous Q12H  . vancomycin  750 mg Intravenous Q M,W,F-HD  . DISCONTD: amitriptyline  50 mg Oral QHS  . DISCONTD: iron dextran (INFED/DEXFERRUM) infusion  100 mg Intravenous Q Mon-HD  . DISCONTD: vancomycin  1,000 mg Intravenous Q M,W,F-HD   Continuous Infusions:   Principal Problem:  *Hyperglycemia Active Problems:  End stage renal disease  Leucocytosis  Diabetes mellitus  Anemia  MRSA infection       Oline Belk  [facility] Pager [phone]. If 8PM-8AM, please contact night-coverage at [email or web address], password [email or web address] 06/25/2012, 9:25 AM  LOS: 2 days

## 2012-06-25 NOTE — Consult Note (Signed)
Reason for Consult: Epigastric pain and anemia. Referring Physician: [facility]-Dr.Sorin Dmya Hrubes is an 41 y.o. female.  HPI:  Ms. Greenland has been re-admitted for further evaluation of epigastric pain [after HD] and worsening anemia. She was unable to give me any history today. She claims she is feeling better after getting the pain medication for her stomach. She denies any other GI issues.    Past Medical History  Diagnosis Date  . GERD (gastroesophageal reflux disease)   . Diabetes mellitus     IDDM  . Hypertension     states has been on med. x "a long time"  . ESRF (end stage renal failure)     dialysis M,W, F; left thigh AV graft  . History of gangrene     left foot  . Hx MRSA infection   . 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 Av fistula placement 10/18/2008    right thigh AV graft  . Finger debridement 06/20/2010    right middle finger  . Finger amputation 05/27/2010    right middle  . Thrombectomy / arteriovenous graft revision 01/14/2010    left superficial femoral artery; left thigh AV graft placement  . 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 Leg amputation below knee 10/11/2009    right  . Central venous catheter insertion 08/06/2009    removal right IJ Diatek cath., placement left IJ Diatek cath.  . 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 Foot amputation 04/04/2009    left transtibial amputation  . Excision / curettage bone cyst talus / calcaneus 03/04/2009    partial calcaneal exc. left; placement wound VAC  . Groin debridement 11/16/2008    right rectus femoris muscle flap to right groin wound; right groing debridement  . Femoral endarterectomy 11/06/2008    right common femoral; embolectomy right femoral artery; removal right femoral AV graft  . Central venous catheter insertion 07/10/2008; 12/31/2006; 01/19/2006; 06/19/2005; 09/19/2004; 09/08/2004    right IJ Diatek catheter  . Av fistula repair  07/08/2008    removal left upper arm AV graft  . Thrombectomy / arteriovenous graft revision 05/11/2007;12/31/2006; 01/18/2006; 12/16/2005    left upper arm  . Dialysis fistula creation 01/20/2007    left upper arm AV graft  . Dialysis fistula creation 01/28/2006    right upper arm AV graft  . Thrombectomy / arteriovenous graft revision 10/26/2005    left forearm AV graft  . Dialysis fistula creation 10/04/2005    left forearm AV graft  . Av fistula repair 07/14/2005    removal infected right forearm AV graft  . Thrombectomy / arteriovenous graft revision 03/11/2005; 10/20/2004    right forearm AV graft  . Dialysis fistula creation 09/10/2004    right forearm AV graft  . Av fistula placement 07/18/2004    creation left AV fistula, radial to cephalic  . Finger exploration 07/13/2002    and repair left middle finger  . Carpal tunnel release 01/26/2012    Procedure: CARPAL TUNNEL RELEASE;  Surgeon: Nicki Reaper, MD;  Location: [facility];  Service: Orthopedics;  Laterality: Left;    Family History  Problem Relation Age of Onset  . Diabetes Mother   . Heart disease Mother   . Hypertension Mother   . Heart attack Mother 51  . Diabetes Sister   . Hypertension Brother   . Heart attack Brother  Social History:  reports that she has never smoked. She has never used smokeless tobacco. She reports that she does not drink alcohol or use illicit drugs.  Allergies:  Allergies  Allergen Reactions  . Percocet (Oxycodone-Acetaminophen) Other (See Comments)    GI UPSET  . Soap Itching    IVORY SOAP   Medications: I have reviewed the patient's current medications.  Results for orders placed during the hospital encounter of 06/23/12 (from the past 48 hour(s))  GLUCOSE, CAPILLARY     Status: Abnormal   Collection Time   06/23/12  8:18 PM      Component Value Range Comment   Glucose-Capillary 223 (*) 70 - 99 mg/dL   CULTURE, BLOOD (ROUTINE X 2)     Status: Normal (Preliminary  result)   Collection Time   06/23/12 10:49 PM      Component Value Range Comment   Specimen Description BLOOD RIGHT RADIAL ARTERY      Special Requests BOTTLES DRAWN AEROBIC ONLY 2CC      Culture  Setup Time 06/24/2012 09:05      Culture        Value:        BLOOD CULTURE RECEIVED NO GROWTH TO DATE CULTURE WILL BE HELD FOR 5 DAYS BEFORE ISSUING A FINAL NEGATIVE REPORT   Report Status PENDING     GLUCOSE, CAPILLARY     Status: Abnormal   Collection Time   06/24/12 12:24 AM      Component Value Range Comment   Glucose-Capillary 225 (*) 70 - 99 mg/dL   GLUCOSE, CAPILLARY     Status: Abnormal   Collection Time   06/24/12  4:17 AM      Component Value Range Comment   Glucose-Capillary 241 (*) 70 - 99 mg/dL   BASIC METABOLIC PANEL     Status: Abnormal   Collection Time   06/24/12  6:25 AM      Component Value Range Comment   Sodium 134 (*) 135 - 145 mEq/L    Potassium 5.3 (*) 3.5 - 5.1 mEq/L    Chloride 95 (*) 96 - 112 mEq/L    CO2 23  19 - 32 mEq/L    Glucose, Bld 196 (*) 70 - 99 mg/dL    BUN 35 (*) 6 - 23 mg/dL    Creatinine, Ser 3.08 (*) 0.50 - 1.10 mg/dL    Calcium 9.6  8.4 - 65.7 mg/dL    GFR calc non Af Amer 7 (*) >90 mL/min    GFR calc Af Amer 8 (*) >90 mL/min   CBC     Status: Abnormal   Collection Time   06/24/12  6:25 AM      Component Value Range Comment   WBC 18.3 (*) 4.0 - 10.5 K/uL    RBC 2.31 (*) 3.87 - 5.11 MIL/uL    Hemoglobin 7.3 (*) 12.0 - 15.0 g/dL    HCT 84.6 (*) 96.2 - 46.0 %    MCV 93.1  78.0 - 100.0 fL    MCH 31.6  26.0 - 34.0 pg    MCHC 34.0  30.0 - 36.0 g/dL    RDW 95.2 (*) 84.1 - 15.5 %    Platelets 258  150 - 400 K/uL   MRSA PCR SCREENING     Status: Abnormal   Collection Time   06/24/12  6:31 AM      Component Value Range Comment   MRSA by PCR POSITIVE (*) NEGATIVE   GLUCOSE, CAPILLARY  Status: Abnormal   Collection Time   06/24/12  8:15 AM      Component Value Range Comment   Glucose-Capillary 141 (*) 70 - 99 mg/dL   GLUCOSE,  CAPILLARY     Status: Normal   Collection Time   06/24/12 11:29 AM      Component Value Range Comment   Glucose-Capillary 79  70 - 99 mg/dL   CBC     Status: Abnormal   Collection Time   06/24/12  1:19 PM      Component Value Range Comment   WBC 14.3 (*) 4.0 - 10.5 K/uL    RBC 2.16 (*) 3.87 - 5.11 MIL/uL    Hemoglobin 6.9 (*) 12.0 - 15.0 g/dL    HCT 45.4 (*) 09.8 - 46.0 %    MCV 95.4  78.0 - 100.0 fL    MCH 31.9  26.0 - 34.0 pg    MCHC 33.5  30.0 - 36.0 g/dL    RDW 11.9 (*) 14.7 - 15.5 %    Platelets 272  150 - 400 K/uL   RENAL FUNCTION PANEL     Status: Abnormal   Collection Time   06/24/12  1:19 PM      Component Value Range Comment   Sodium 134 (*) 135 - 145 mEq/L    Potassium 4.4  3.5 - 5.1 mEq/L    Chloride 94 (*) 96 - 112 mEq/L    CO2 28  19 - 32 mEq/L    Glucose, Bld 136 (*) 70 - 99 mg/dL    BUN 37 (*) 6 - 23 mg/dL    Creatinine, Ser 8.29 (*) 0.50 - 1.10 mg/dL    Calcium 9.5  8.4 - 56.2 mg/dL    Phosphorus 5.1 (*) 2.3 - 4.6 mg/dL    Albumin 2.6 (*) 3.5 - 5.2 g/dL    GFR calc non Af Amer 6 (*) >90 mL/min    GFR calc Af Amer 7 (*) >90 mL/min   PREPARE RBC (CROSSMATCH)     Status: Normal   Collection Time   06/24/12  2:45 PM      Component Value Range Comment   Order Confirmation ORDER PROCESSED BY BLOOD BANK     TYPE AND SCREEN     Status: Normal   Collection Time   06/24/12  2:45 PM      Component Value Range Comment   ABO/RH(D) A POS      Antibody Screen NEG      Sample Expiration 06/27/2012      Unit Number [id number]      Blood Component Type RED CELLS,LR      Unit division 00      Status of Unit ISSUED,FINAL      Transfusion Status OK TO TRANSFUSE      Crossmatch Result Compatible      Unit Number [id number]      Blood Component Type RED CELLS,LR      Unit division 00      Status of Unit ISSUED,FINAL      Transfusion Status OK TO TRANSFUSE      Crossmatch Result Compatible     CULTURE, BLOOD (ROUTINE X 2)     Status: Normal (Preliminary  result)   Collection Time   06/24/12  3:20 PM      Component Value Range Comment   Specimen Description BLOOD GRAFT LEFT      Special Requests BOTTLES DRAWN AEROBIC AND ANAEROBIC 10CC AVG  Culture  Setup Time 06/24/2012 20:12      Culture        Value:        BLOOD CULTURE RECEIVED NO GROWTH TO DATE CULTURE WILL BE HELD FOR 5 DAYS BEFORE ISSUING A FINAL NEGATIVE REPORT   Report Status PENDING     CULTURE, BLOOD (ROUTINE X 2)     Status: Normal (Preliminary result)   Collection Time   06/24/12  3:25 PM      Component Value Range Comment   Specimen Description BLOOD GRAFT LEFT      Special Requests BOTTLES DRAWN AEROBIC AND ANAEROBIC 10CC AVG      Culture  Setup Time 06/24/2012 20:12      Culture        Value:        BLOOD CULTURE RECEIVED NO GROWTH TO DATE CULTURE WILL BE HELD FOR 5 DAYS BEFORE ISSUING A FINAL NEGATIVE REPORT   Report Status PENDING     GLUCOSE, CAPILLARY     Status: Abnormal   Collection Time   06/24/12  7:00 PM      Component Value Range Comment   Glucose-Capillary 197 (*) 70 - 99 mg/dL   GLUCOSE, CAPILLARY     Status: Abnormal   Collection Time   06/24/12  9:38 PM      Component Value Range Comment   Glucose-Capillary 249 (*) 70 - 99 mg/dL    Comment 1 Documented in Chart      Comment 2 Notify RN     GLUCOSE, CAPILLARY     Status: Abnormal   Collection Time   06/25/12  5:07 AM      Component Value Range Comment   Glucose-Capillary 313 (*) 70 - 99 mg/dL    Comment 1 Documented in Chart      Comment 2 Notify RN     BASIC METABOLIC PANEL     Status: Abnormal   Collection Time   06/25/12  6:12 AM      Component Value Range Comment   Sodium 136  135 - 145 mEq/L    Potassium 4.5  3.5 - 5.1 mEq/L HEMOLYSIS AT THIS LEVEL MAY AFFECT RESULT   Chloride 97  96 - 112 mEq/L    CO2 26  19 - 32 mEq/L    Glucose, Bld 311 (*) 70 - 99 mg/dL    BUN 29 (*) 6 - 23 mg/dL    Creatinine, Ser 1.61 (*) 0.50 - 1.10 mg/dL DELTA CHECK NOTED   Calcium 9.3  8.4 - 10.5 mg/dL      GFR calc non Af Amer 13 (*) >90 mL/min    GFR calc Af Amer 15 (*) >90 mL/min   CBC     Status: Abnormal   Collection Time   06/25/12  6:12 AM      Component Value Range Comment   WBC 16.9 (*) 4.0 - 10.5 K/uL    RBC 2.71 (*) 3.87 - 5.11 MIL/uL    Hemoglobin 9.1 (*) 12.0 - 15.0 g/dL POST TRANSFUSION SPECIMEN   HCT 25.9 (*) 36.0 - 46.0 %    MCV 95.6  78.0 - 100.0 fL    MCH 33.6  26.0 - 34.0 pg    MCHC 35.1  30.0 - 36.0 g/dL    RDW 09.6 (*) 04.5 - 15.5 %    Platelets 211  150 - 400 K/uL   GLUCOSE, CAPILLARY     Status: Abnormal   Collection Time   06/25/12  7:37 AM      Component Value Range Comment   Glucose-Capillary 266 (*) 70 - 99 mg/dL   GLUCOSE, CAPILLARY     Status: Abnormal   Collection Time   06/25/12 11:46 AM      Component Value Range Comment   Glucose-Capillary 195 (*) 70 - 99 mg/dL   GLUCOSE, CAPILLARY     Status: Abnormal   Collection Time   06/25/12  5:00 PM      Component Value Range Comment   Glucose-Capillary 234 (*) 70 - 99 mg/dL     No results found.  Review of Systems  Unable to perform ROS  Blood pressure 155/89, pulse 107, temperature 97.8 F (36.6 C), temperature source Oral, resp. rate 18, height 5\' 3"  (1.6 m), weight 81.7 kg (180 lb 1.9 oz), SpO2 100.00%. Physical Exam  Constitutional: She appears well-developed and well-nourished.  HENT:  Head: Normocephalic and atraumatic.  Eyes: Conjunctivae normal are normal. Pupils are equal, round, and reactive to light.  Neck: Normal range of motion. Neck supple.  Cardiovascular: Normal rate and regular rhythm.   Respiratory: Effort normal and breath sounds normal.  GI: Soft. Bowel sounds are normal.  Neurological:       Very sleepy, difficult to arouse  Skin: Skin is warm.    Assessment/Plan: 1) Epigastric pain/anemia: will do an EGD to rule out PUD. Will need to be NPO tonight. 2) ESRD on HD.  3) Acinotobater B. Bacteremia.  4) Bilateral amputee.  Kristy Catoe 06/25/2012, 5:55 PM

## 2012-06-26 ENCOUNTER — Encounter (HOSPITAL_COMMUNITY)
Admission: EM | Disposition: A | Discharge status: Home / Self Care | Payer: Self-pay | Source: Home / Self Care | Attending: Internal Medicine

## 2012-06-26 ENCOUNTER — Encounter (HOSPITAL_COMMUNITY): Payer: Self-pay

## 2012-06-26 HISTORY — PX: ESOPHAGOGASTRODUODENOSCOPY: SHX5428

## 2012-06-26 LAB — RENAL FUNCTION PANEL
Albumin: 2.6 g/dL — ABNORMAL LOW (ref 3.5–5.2)
GFR calc Af Amer: 8 mL/min — ABNORMAL LOW (ref >90)
Phosphorus: 5.1 mg/dL — ABNORMAL HIGH (ref 2.3–4.6)
Potassium: 4 mEq/L (ref 3.5–5.1)
Sodium: 133 mEq/L — ABNORMAL LOW (ref 135–145)

## 2012-06-26 LAB — CBC
MCHC: 34.5 g/dL (ref 30.0–36.0)
Platelets: 255 10*3/uL (ref 150–400)
RDW: 19.3 % — ABNORMAL HIGH (ref 11.5–15.5)

## 2012-06-26 LAB — GLUCOSE, CAPILLARY
Glucose-Capillary: 241 mg/dL — ABNORMAL HIGH (ref 70–99)
Glucose-Capillary: 215 mg/dL — ABNORMAL HIGH (ref 70–99)

## 2012-06-26 SURGERY — EGD (ESOPHAGOGASTRODUODENOSCOPY)
Anesthesia: Moderate Sedation

## 2012-06-26 MED ORDER — FENTANYL CITRATE 0.05 MG/ML IJ SOLN
INTRAMUSCULAR | Status: AC
  Filled 2012-06-26: qty 4

## 2012-06-26 MED ORDER — SODIUM CHLORIDE 0.9 % IV SOLN: INTRAVENOUS | Status: DC

## 2012-06-26 MED ORDER — OMEPRAZOLE 40 MG PO CPDR: 40.0000 mg | DELAYED_RELEASE_CAPSULE | Freq: Every day | ORAL | Status: DC

## 2012-06-26 MED ORDER — MIDAZOLAM HCL 5 MG/ML IJ SOLN
INTRAMUSCULAR | Status: AC
  Filled 2012-06-26: qty 2

## 2012-06-26 MED ORDER — HEPARIN SODIUM (PORCINE) 1000 UNIT/ML DIALYSIS
20.0000 [IU]/kg | INTRAMUSCULAR | Status: DC | PRN
  Filled 2012-06-26: qty 2

## 2012-06-26 MED ORDER — SODIUM CHLORIDE 0.9 % IV SOLN
INTRAVENOUS | Status: DC
  Administered 2012-06-26: 10:00:00 via INTRAVENOUS

## 2012-06-26 MED ORDER — MIDAZOLAM HCL 10 MG/2ML IJ SOLN
INTRAMUSCULAR | Status: DC | PRN
  Administered 2012-06-26 (×2): 2 mg via INTRAVENOUS

## 2012-06-26 MED ORDER — FENTANYL CITRATE 0.05 MG/ML IJ SOLN
INTRAMUSCULAR | Status: DC | PRN
  Administered 2012-06-26 (×2): 25 ug via INTRAVENOUS

## 2012-06-26 MED ORDER — DIPHENHYDRAMINE HCL 50 MG/ML IJ SOLN
INTRAMUSCULAR | Status: AC
  Filled 2012-06-26: qty 1

## 2012-06-26 MED ORDER — AMITRIPTYLINE HCL 10 MG PO TABS: 10.0000 mg | ORAL_TABLET | Freq: Every day | ORAL | Status: DC

## 2012-06-26 NOTE — Op Note (Signed)
[name] [address] [place], [address]   OPERATIVE PROCEDURE REPORT  PATIENT :[name], [name]  MR#: [id number] BIRTHDATE :11/05/1970 GENDER: Female ENDOSCOPIST: Dr.  Lorenza Burton, MD ASSISTANT:   Dorisann Frames, technician  and Felecia Shelling, RN. PROCEDURE DATE: 2012-07-19  PRE-PROCEDURE PREPERATION: Patient fasted for 8 hours prior to the procedure. PRE-PROCEDURE PHYSICAL: Patient has stable vital signs.  Neck is supple.  There is no JVD, thyromegaly or LAD.  Chest clear to auscultation.  S1 and S2 regular.  Abdomen soft, morbidly obese, non-distended with epigastric tenderness on palpation with NABS. Patient has had amputations bilaterally. PROCEDURE: EGD with multiple  biopsies. ASA CLASS: Class III INDICATIONS: Epigastric pain and iron deficiency anemia. MEDICATIONS: Fentanyl 50 mcg Iand Versed 4 mg IV TOPICAL ANESTHETIC:  none .  DESCRIPTION OF PROCEDURE:   After the risks benefits and alternatives of the procedure were thoroughly explained, informed consent was obtained.  The Pentax Gastroscope [id number]  was introduced through the mouth and advanced to the second portion of the duodenum , without limitations. The instrument was slowly withdrawn as the mucosa was fully examined.    The esophagus and GEJ and the proximal small bowel appeared normal. There was evidence of mild patchy hemorrhagic gastritis with old heme in the stomach; antral biopsies were done to rule out H. pylori by pathology. No ulcers, masses or polyps noted. Retroflexed views revealed no abnormalities. The scope was then withdrawn from the patient and the procedure terminated. The patient tolerated the procedure without immediate complications.  IMPRESSION: Patchy hemorrhagic gastritis with some old heme in the stomach; antral biopsies were done to rule out H. pylori by pathology; otherwise, normal esophagogastroduodenoscopy.  RECOMMENDATIONS: 1.  Avoid NSAIDS  for now except for ECASA 81 mg per day. 2.  Await pathology results. 3.  Continue PPI's.  REPEAT EXAM:No recall planned for now.  DISCHARGE INSTRUCTIONS: standard discharge.  _______________________________ eSigned:  Dr. Lorenza Burton, MD 07-19-12 12:16 PM   CPT CODES: 62130    DIAGNOSIS CODES: 789.06, 280.9  CC: Lonia Blood, M.D.  PATIENT NAME:  [name], [name] MR#: [id number]

## 2012-06-26 NOTE — Progress Notes (Addendum)
ANTIBIOTIC CONSULT NOTE - Follow-Up  Pharmacy Consult for Cefepime and Vancomycin Indication: Pt with DM, ?wound infection  Allergies  Allergen Reactions  . Percocet (Oxycodone-Acetaminophen) Other (See Comments)    GI UPSET  . Soap Itching    IVORY SOAP   Labs:  [facility] 06/25/12 0612 06/24/12 1319 06/24/12 0625  WBC 16.9* 14.3* 18.3*  HGB 9.1* 6.9* 7.3*  PLT 211 272 258  LABCREA -- -- --  CREATININE 3.91* 7.13* 6.75*   Estimated Creatinine Clearance: 19 ml/min (by C-G formula based on Cr of 3.91). No results found for this basename: VANCOTROUGH:2,VANCOPEAK:2,VANCORANDOM:2,GENTTROUGH:2,GENTPEAK:2,GENTRANDOM:2,TOBRATROUGH:2,TOBRAPEAK:2,TOBRARND:2,AMIKACINPEAK:2,AMIKACINTROU:2,AMIKACIN:2, in the last 72 hours    Assessment: 41 yo female with history of DM,  ESRD on hemodialysis qMWF, gangrene left foot, bilateral AKA and hx of MRSA infection admitted on 06/22/12 due to several days of nausea, vomiting and epigastric abdominal pain. Recently discharged from [name] on 06/20/12 with discharge diagnosis of severe anemia secondary to ESRD, DM type 2 and mild leukocytosis of unknown origin (DC'd on empiric Moxifloxacin).  She was  previously on a 6 week course of IV vancomycin 1gm qHD(MWF),  IV ceftazidime and oral rifampin for MRSA (cultured from drainage from graft) and bacteremia (Acinetobacter calcoaceticus cultured) in Aug/Sept 2013.    Afebrile, WBC WNL  Goal of Therapy:  Pre HD vancomycin level 15-25 mcg/ml  Plan:  Continue Cefepime 2 g IV tonight then 2 g IV qHD (MWF). Continue Vancomycin 1 g IV qHD (MWF) Monitor cultures and vancomycin pre HD level per protocol.  Okey Regal, PharmD [phone]  06/26/2012,2:57 PM

## 2012-06-26 NOTE — Discharge Summary (Signed)
Physician Discharge Summary  [name] ZOX:[id number] DOB: 04-08-1971 DOA: 06/23/2012  PCP: Lieutenant Diego, MD  Admit date: 06/23/2012 Discharge date: 06/26/2012  Time spent: 35 minutes  Discharge Diagnoses:  Abdominal pain - probably multifactorial - gastritis and ? Ischemia during HD  End stage renal disease  Leukocytosis - all cultures negative   Diabetes mellitus type 2 uncontrolled   Anemia Hx of Acinetobacter bacteremia   Discharge Condition: fair  Diet recommendation: renal  Filed Weights   06/24/12 1308 06/24/12 1805 06/24/12 2148  Weight: 82.9 kg (182 lb 12.2 oz) 80.3 kg (177 lb 0.5 oz) 81.7 kg (180 lb 1.9 oz)    History of present illness:  [name] is a 41 y.o. female with past medical history of hypertension, end-stage renal disease and diabetes mellitus (which is actually relatively well controlled) who was recently discharged from the hospitalist service 4 days ago for a severe anemia which was felt to be secondary to end-stage renal disease and at that time noted to have a mild leukocytosis of unknown origin and was put on empiric Avelox. She was hospitalized 2 months ago for an Acinetobacter bacteremia. She came to the emergency room today for several days uncontrolled nausea and vomiting and severe abdominal pain. She reported no diarrhea. Her white blood cell count was noted elevated at 16 and her sugars were in the 400s with a minimally elevated anion gap. Patient was hydrated and given subcutaneous insulin. A CT scan of the abdomen and pelvis was unremarkable. As her pain improved, She insisted on leaving AGAINST MEDICAL ADVICE because she had to 10 to her daughter, but then came back several hours later. She was given more subcutaneous insulin and then hospitalists were called for further evaluation and treatment.  When I saw the patient after she was transferred up to the medical floor, she's feeling better. Her abdominal pain had subsided and she was  requesting something to drink. She denied any headaches, vision changes, dysphasia, chest pain, palpitations, shortness of breath, wheeze, cough, hematuria, dysuria, constipation, diarrhea, focal extremity numbness weakness or pain. Several hours later, her abdominal pain recurred. At that point she was describing it as a 10 out of 10. She describes it as generalized and nonfocal. Review of systems otherwise negative.   Hospital Course:  1. Severe Abdominal pain on admission - ? Etiology ? transient hypotensive/ ischemic event post hd. CT abdomen and pelvis WNL. Severe Abdominal pain resolved by 10/25 . Patient still with some epigastric pain by 10/26. She is regurgitating clear frothy material - from stomach - ? GERD vs PUd vs gastritis. Dr. Loreta Ave performed EGD on 06/26/12 with findings of gastritis.Patient was started on Protonix iv 10/26 . She was switched to omeprazole po at DC  2. ESRD - mwf ( [facility]), nephrology following  3. Chronic Anemia with recent episode of worsening anemia Was admitted 10/18 - 10/20 and received 3u prbc's for Hb 5.1. Work up showed guiac neg stool; EGD and colon could not be done as pt didn't tolerated prep and plans were made for outpatient endsocopy. Hb was 8.2 at discharge. Upon return to hospital on 10/24 hemoglobin was down again to 6.9 Patient received 2u prbc with HD 10/25 Hemoglobin at 9.1 on 10/26 . No overt signs of bleeding.  4. IDDM type 2 = Hyperglycemia on admit. Patient on 70/30 BID at home - on SSI Novolog in house.  5. Recent Acinetobacter bacteremia, Aug 2013- on 8/25 patient presented with AVG bleed. The surgeon's removed  a piece of her graft that day, not for infection but due to pseudoaneursym and bleeding problems. The graft was not grossly infected per OP note. She was discharged but returned a day or two later with [facility] 20-30K, GI symptoms and1/2 blood cx's from the 8/25 viisit coming back + for acinetobacter baumanii. ID saw her and recommended 6 weeks of IV  vanc and fortaz and 6 wks of po rifampin for suspected AVG infection. No more graft surgery was done. She was discharged on 8/30. She finished the antibiotics on 10/8. WBC at time of admission here is 14-16K. AVG does not show gross signs of infection on exam. Due to leukocytosis and pulmonary infiltrates patient was started back on empiric abx this admission from 10/24 on Vanc and Zosyn. As cultured remained negative we discontinued abx at DC      Procedures: HD EGD  Consultations:  Renal  GI  Discharge Exam: Filed Vitals:   06/26/12 1155 06/26/12 1205 06/26/12 1230 06/26/12 1245  BP: 137/61 133/78 147/84 152/88  Pulse:  96    Temp:      TempSrc:      Resp: 16 16 14 15   Height:      Weight:      SpO2: 100% 100% 100% 97%    General: axox3 Cardiovascular: rrr Respiratory: ctab  Discharge Instructions  Discharge Orders    Future Appointments: Provider: Department: Dept Phone: Center:   06/28/2012 10:30 AM Larina Earthly, MD [facility] [phone] [facility]   09/06/2012 2:30 PM Randall Hiss, MD [facility] [phone] [facility]     Future Orders Please Complete By Expires   Diet renal [id number]      Increase activity slowly          Medication List     As of 06/26/2012  1:38 PM    STOP taking these medications         aspirin EC 81 MG tablet      moxifloxacin 400 MG tablet   Commonly known as: AVELOX      TAKE these medications         amitriptyline 10 MG tablet   Commonly known as: ELAVIL   Take 1 tablet (10 mg total) by mouth at bedtime.      amLODipine 10 MG tablet   Commonly known as: NORVASC   Take 5 mg by mouth daily.      calcium acetate 667 MG capsule   Commonly known as: PHOSLO   Take 1,334 mg by mouth 3 (three) times daily with meals.      gabapentin 100 MG capsule   Commonly known as: NEURONTIN   Take 100 mg by mouth at bedtime.      hydrOXYzine 25 MG tablet   Commonly known as: ATARAX/VISTARIL   Take 25 mg by mouth 3  (three) times daily as needed. For itching      insulin aspart protamine-insulin aspart (70-30) 100 UNIT/ML injection   Commonly known as: NOVOLOG 70/30   Inject 6-8 Units into the skin 2 (two) times daily with a meal. Take 8 units in the morning and 6 units in the evening      metoCLOPramide 10 MG tablet   Commonly known as: REGLAN   Take 10 mg by mouth 3 (three) times daily as needed. For stomach upset      multivitamin Tabs tablet   Take 1 tablet by mouth at bedtime.      omeprazole 40 MG capsule  Commonly known as: PRILOSEC   Take 1 capsule (40 mg total) by mouth daily.      RENVELA 800 MG tablet   Generic drug: sevelamer   Take 800 mg by mouth 3 (three) times daily with meals.           Follow-up Information    Please follow up. (at Hemodialysis unit )           The results of significant diagnostics from this hospitalization (including imaging, microbiology, ancillary and laboratory) are listed below for reference.    Significant Diagnostic Studies: Dg Chest 2 View  06/22/2012  *RADIOLOGY REPORT*  Clinical Data: Abdominal pain and emesis.  CHEST - 2 VIEW  Comparison: 06/17/2012  Findings: Two views of the chest were obtained.  Stable appearance of the heart and mediastinum.  Again noted are patchy densities at both lung bases, left side greater than right.  The findings have not significantly changed and there may be a chronic component. Upper lungs are clear.  Trachea is midline.  IMPRESSION: Persistent bibasilar densities, left side greater than right. Findings could represent acute or chronic changes.  Minimal change from the prior examination.   Original Report Authenticated By: Richarda Overlie, M.D.    Ct Abdomen Pelvis W Contrast  06/23/2012  *RADIOLOGY REPORT*  Clinical Data: Emesis and abdominal pain.  End-stage renal disease and hemodialysis.  CT ABDOMEN AND PELVIS WITH CONTRAST  Technique:  Multidetector CT imaging of the abdomen and pelvis was performed following  the standard protocol during bolus administration of intravenous contrast.  Contrast: 80mL OMNIPAQUE IOHEXOL 300 MG/ML  SOLN  Comparison: CT 04/24/2012  Findings: There are patchy parenchymal densities at the lung bases. This could represent chronic changes and atelectasis.  There is no evidence for free intraperitoneal air.  The patient has a left femoral dialysis AV graft.  No gross abnormality to the liver, portal venous system or gallbladder. There is a normal appearance of the spleen, pancreas and adrenal glands.  Both kidneys are atrophic.  Evidence for bilateral renal cortical cysts and punctate renal calcifications which may be vascular.  There are extensive calcifications involving the visceral arteries.  Normal caliber to the abdominal aorta.  The inferior vena cava and iliac vessels appear to be patent.  A calcified fibroid along the right side of the uterus.  No significant free fluid or lymphadenopathy.  No acute bony abnormality.  IMPRESSION: No acute abnormalities within the abdomen or pelvis.  Patchy densities in the lung bases could represent scarring and/or atelectasis.  Renal atrophy and renal cortical cysts.  Findings are compatible with end-stage renal disease.   Original Report Authenticated By: Richarda Overlie, M.D.    Dg Chest Port 1 View  06/17/2012  *RADIOLOGY REPORT*  Clinical Data: Shortness of breath.  Productive cough.  PORTABLE CHEST - 1 VIEW  Comparison: 04/25/2012  Findings: Mild cardiomegaly.  Bibasilar atelectasis or infiltrates, unchanged on the left, increased on the right.  No effusions or edema.  No acute bony abnormality.  IMPRESSION: Bibasilar airspace opacities, increasing on the right.  These could represent atelectasis or pneumonia.   Original Report Authenticated By: Cyndie Chime, M.D.     Microbiology: Recent Results (from the past 240 hour(s))  CULTURE, BLOOD (ROUTINE X 2)     Status: Normal   Collection Time   06/17/12  6:00 PM      Component Value Range Status  Comment   Specimen Description BLOOD HAND RIGHT   Final  Special Requests BOTTLES DRAWN AEROBIC ONLY 5CC   Final    Culture  Setup Time 06/18/2012 01:38   Final    Culture NO GROWTH 5 DAYS   Final    Report Status 06/24/2012 FINAL   Final   CULTURE, BLOOD (ROUTINE X 2)     Status: Normal (Preliminary result)   Collection Time   06/23/12 10:49 PM      Component Value Range Status Comment   Specimen Description BLOOD RIGHT RADIAL ARTERY   Final    Special Requests BOTTLES DRAWN AEROBIC ONLY 2CC   Final    Culture  Setup Time 06/24/2012 09:05   Final    Culture     Final    Value:        BLOOD CULTURE RECEIVED NO GROWTH TO DATE CULTURE WILL BE HELD FOR 5 DAYS BEFORE ISSUING A FINAL NEGATIVE REPORT   Report Status PENDING   Incomplete   MRSA PCR SCREENING     Status: Abnormal   Collection Time   06/24/12  6:31 AM      Component Value Range Status Comment   MRSA by PCR POSITIVE (*) NEGATIVE Final   CULTURE, BLOOD (ROUTINE X 2)     Status: Normal (Preliminary result)   Collection Time   06/24/12  3:20 PM      Component Value Range Status Comment   Specimen Description BLOOD GRAFT LEFT   Final    Special Requests BOTTLES DRAWN AEROBIC AND ANAEROBIC 10CC AVG   Final    Culture  Setup Time 06/24/2012 20:12   Final    Culture     Final    Value:        BLOOD CULTURE RECEIVED NO GROWTH TO DATE CULTURE WILL BE HELD FOR 5 DAYS BEFORE ISSUING A FINAL NEGATIVE REPORT   Report Status PENDING   Incomplete   CULTURE, BLOOD (ROUTINE X 2)     Status: Normal (Preliminary result)   Collection Time   06/24/12  3:25 PM      Component Value Range Status Comment   Specimen Description BLOOD GRAFT LEFT   Final    Special Requests BOTTLES DRAWN AEROBIC AND ANAEROBIC 10CC AVG   Final    Culture  Setup Time 06/24/2012 20:12   Final    Culture     Final    Value:        BLOOD CULTURE RECEIVED NO GROWTH TO DATE CULTURE WILL BE HELD FOR 5 DAYS BEFORE ISSUING A FINAL NEGATIVE REPORT   Report Status PENDING    Incomplete      Labs: Basic Metabolic Panel:  Lab 06/25/12 2956 06/24/12 1319 06/24/12 0625 06/23/12 1423 06/23/12 0550 06/22/12 2018  NA 136 134* 134* -- 133* 137  K 4.5 4.4 5.3* -- 4.1 4.1  CL 97 94* 95* -- 92* 96  CO2 26 28 23  -- 27 29  GLUCOSE 311* 136* 196* -- 417* 422*  BUN 29* 37* 35* -- 21 14  CREATININE 3.91* 7.13* 6.75* 5.51* 5.02* --  CALCIUM 9.3 9.5 9.6 -- 9.3 9.0  MG -- -- -- -- -- --  PHOS -- 5.1* -- -- -- --   Liver Function Tests:  Lab 06/24/12 1319 06/23/12 0550 06/22/12 2018  AST -- 55* 44*  ALT -- 14 14  ALKPHOS -- 75 78  BILITOT -- 2.3* 2.1*  PROT -- 7.2 7.2  ALBUMIN 2.6* 3.0* 3.0*    Lab 06/23/12 0550 06/22/12 2018  LIPASE 47 42  AMYLASE -- --   No results found for this basename: AMMONIA:5 in the last 168 hours CBC:  Lab 06/25/12 0612 06/24/12 1319 06/24/12 0625 06/23/12 1423 06/23/12 0550 06/22/12 2018  WBC 16.9* 14.3* 18.3* 16.4* 16.6* --  NEUTROABS -- -- -- -- 10.8* 11.9*  HGB 9.1* 6.9* 7.3* 8.0* 8.8* --  HCT 25.9* 20.6* 21.5* 23.6* 25.9* --  MCV 95.6 95.4 93.1 96.3 93.5 --  PLT 211 272 258 210 248 --   Cardiac Enzymes:  Lab 06/22/12 2326  CKTOTAL --  CKMB --  CKMBINDEX --  TROPONINI <0.30   BNP: BNP (last 3 results) No results found for this basename: PROBNP:3 in the last 8760 hours CBG:  Lab 06/26/12 0741 06/26/12 0632 06/26/12 0145 06/25/12 2132 06/25/12 1700  GLUCAP 198* 215* 241* 256* 234*       Signed:  Stewart Sasaki  [facility] 06/26/2012, 1:38 PM

## 2012-06-26 NOTE — Progress Notes (Signed)
[facility] Progress Note  Subjective:  Denies pain.  No nausea and vomiting. Asking questions about EGD process.  Objective Filed Vitals:   06/25/12 0500 06/25/12 0935 06/25/12 1400 06/26/12 0611  BP: 196/98 137/82 155/89 161/93  Pulse: 108 108 107 105  Temp: 99.1 F (37.3 C) 98 F (36.7 C) 97.8 F (36.6 C) 98.3 F (36.8 C)  TempSrc: Oral Oral Oral Oral  Resp: 18 18 18 19   Height:      Weight:      SpO2: 100% 100% 100% 97%   Physical Exam General: Sleeping soundly, but rousable with some effort Heart: tachy regular Lungs: grossly clear without wheezes or rales Abdomen: obese no tender Extremities: bilateral AKA no edema Dialysis Access: left thigh graft patent without erythema or tenderness  Dialysis Orders: Center: [facility] on MWF .  EDW 81.0 kg HD Bath 2.0 k, ca 2.25 Time 4hrs Heparin 7ml. Access Left Fem Avgg BFR 400 DFR 800  Zemplar 6 mcg IV/HD Epogen 18000 Units IV/HD Infed 100 mg q hd Until 06/27/12 then weekly 100 mg on hd  Other prostat q hd  Assessment/Plan: 1. Abd pain/N/V= EGD today showed prob hemorrhagic gastritis. Avoid nsaid's, asa 81 ok, PPi's per GI. Symptoms better. Blood cx's negative. 2. ESRD - mwf ( [facility]), hd Friday to post wt 80.3( below edw);  HD orders written for 10/28 3. Hypertension/volume - still hypertensive on Norvasc 5mg  hs; Challenge volume with Monday HD; sats ok on room airChallenge volume with Monday HD; sats ok on room air 4. Anemia - hgb 6.9 To 9.1  after 2 uprbcs on hd (10/25)  on max aranesp 200 and infed; also admitted 06/17/12 with hgb 5.1 (also transfusedwith guaiac neg stool; last admission initially refused colonoscopy, then agreed, but procedure aborted due to inadequate prep. Tsat 22% with ferritin 1262 10/18. Hgb ranged 8.2 - 9.2 (10/20 - 10/23) 5. Metabolic bone disease - Zemplar on hd; Renvela and Phoslo  Phos 5.1 and Ca 10.7 ~corrected 6. IDDM type 2 = Hyperglycemia on admit Now 200 - 250s 7. 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 Completed 6 wks abx on 10/8 per ID. F/U cx's have been negative here and thigh graft looks good on exam.   Sheffield Slider, PA-C [facility] Beeper [phone]  06/26/2012,8:54 AM  LOS: 3 days   Patient seen and examined and agree with assessment and plan as above.  Mary Moselle  MD [facility] [phone] pgr    [phone] cell 06/26/2012, 12:28 PM   Additional Objective Labs: Basic Metabolic Panel:  Lab 06/25/12 5638 06/24/12 1319 06/24/12 0625  NA 136 134* 134*  K 4.5 4.4 5.3*  CL 97 94* 95*  CO2 26 28 23   GLUCOSE 311* 136* 196*  BUN 29* 37* 35*  CREATININE 3.91* 7.13* 6.75*  CALCIUM 9.3 9.5 9.6  ALB -- -- --  PHOS -- 5.1* --   Liver Function Tests:  Lab 06/24/12 1319 06/23/12 0550 06/22/12 2018  AST -- 55* 44*  ALT -- 14 14  ALKPHOS -- 75 78  BILITOT -- 2.3* 2.1*  PROT -- 7.2 7.2  ALBUMIN 2.6* 3.0* 3.0*    Lab 06/23/12 0550 06/22/12 2018  LIPASE 47 42  AMYLASE -- --   CBC:  Lab 06/25/12 0612 06/24/12 1319 06/24/12 0625 06/23/12 1423 06/23/12 0550 06/22/12 2018  WBC 16.9* 14.3* 18.3* -- -- --  NEUTROABS -- -- -- --  10.8* 11.9*  HGB 9.1* 6.9* 7.3* -- -- --  HCT 25.9* 20.6* 21.5* -- -- --  MCV 95.6 95.4 93.1 96.3 93.5 --  PLT 211 272 258 -- -- --   Blood Culture    Component Value Date/Time   SDES BLOOD GRAFT LEFT 06/24/2012 1525   SPECREQUEST BOTTLES DRAWN AEROBIC AND ANAEROBIC 10CC AVG 06/24/2012 1525   CULT        BLOOD CULTURE RECEIVED NO GROWTH TO DATE CULTURE WILL BE HELD FOR 5 DAYS BEFORE ISSUING A FINAL NEGATIVE REPORT 06/24/2012 1525   REPTSTATUS PENDING 06/24/2012 1525    Cardiac Enzymes:  Lab 06/22/12 2326  CKTOTAL --  CKMB --  CKMBINDEX --  TROPONINI <0.30   CBG:  Lab 06/26/12 0741 06/26/12 0632 06/26/12 0145 06/25/12 2132 06/25/12 1700  GLUCAP 198* 215* 241* 256* 234*    Medications:    . sodium chloride        . amitriptyline  10 mg Oral QHS  . amLODipine  5 mg Oral Daily  . aspirin EC  81 mg Oral Daily  . calcium acetate  1,334 mg Oral TID WC  . ceFEPime (MAXIPIME) IV  2 g Intravenous Q M,W,F-HD  . Chlorhexidine Gluconate Cloth  6 each Topical Q0600  . darbepoetin (ARANESP) injection - DIALYSIS  200 mcg Intravenous Q Fri-HD  . enoxaparin (LOVENOX) injection  30 mg Subcutaneous Q24H  . gabapentin  100 mg Oral QHS  . insulin aspart  0-15 Units Subcutaneous TID WC  . insulin aspart  2 Units Subcutaneous TID WC  . iron dextran (INFED/DEXFERRUM) infusion  100 mg Intravenous Q M,W,F-HD  . iron dextran (INFED/DEXFERRUM) infusion  100 mg Intravenous Q Mon-HD  . multivitamin  1 tablet Oral QHS  . mupirocin ointment  1 application Nasal BID  . ondansetron (ZOFRAN) IV  4 mg Intravenous Once  . pantoprazole (PROTONIX) IV  40 mg Intravenous Q24H  . paricalcitol  6 mcg Intravenous 3 times weekly  . sevelamer  800 mg Oral TID WC  . sodium chloride  3 mL Intravenous Q12H  . vancomycin  750 mg Intravenous Q M,W,F-HD  . DISCONTD: amitriptyline  50 mg Oral QHS  . DISCONTD: insulin aspart  0-15 Units Subcutaneous Q4H

## 2012-06-26 NOTE — CV Procedure (Addendum)
Risks and benefits have been discussed with the patient. Pre-sedation evaluation option not availbale on Epic.

## 2012-06-26 NOTE — Progress Notes (Signed)
Subjective: Since I last evaluated the patient, she continues to have epigastric pain. She is an ASA III and MP 3   Objective: Vital signs in last 24 hours: Temp:  [97.8 F (36.6 C)-98.9 F (37.2 C)] 98.9 F (37.2 C) (10/27 0930) Pulse Rate:  [98-107] 98  (10/27 0930) Resp:  [12-19] 12  (10/27 1025) BP: (153-161)/(86-93) 153/86 mmHg (10/27 1025) SpO2:  [97 %-100 %] 97 % (10/27 1025) Last BM Date: 06/24/12  Intake/Output from previous day: 10/26 0701 - 10/27 0700 In: 390 [P.O.:390] Out: -  Intake/Output this shift:  General appearance: cooperative, fatigued and moderately obese Resp: clear to auscultation bilaterally Cardio: regular rate and rhythm, S1, S2 normal, no murmur, click, rub or gallop GI: soft, morbidly obese with epigastric tenderness on palaption, bowel sounds normal; no masses,  no organomegaly  Lab Results:  [facility] 06/25/12 0612 06/24/12 1319 06/24/12 0625  WBC 16.9* 14.3* 18.3*  HGB 9.1* 6.9* 7.3*  HCT 25.9* 20.6* 21.5*  PLT 211 272 258   BMET  Basename 06/25/12 0612 06/24/12 1319 06/24/12 0625  NA 136 134* 134*  K 4.5 4.4 5.3*  CL 97 94* 95*  CO2 26 28 23   GLUCOSE 311* 136* 196*  BUN 29* 37* 35*  CREATININE 3.91* 7.13* 6.75*  CALCIUM 9.3 9.5 9.6   LFT  Basename 06/24/12 1319  PROT --  ALBUMIN 2.6*  AST --  ALT --  ALKPHOS --  BILITOT --  BILIDIR --  IBILI --  Medications: I have reviewed the patient's current medications.  Assessment/Plan: Epigastric pian and anemia: proceed with an EGD at this time.  LOS: 3 days   Jaeden Westbay 06/26/2012, 11:29 AM

## 2012-06-27 ENCOUNTER — Emergency Department (HOSPITAL_COMMUNITY)
Admission: EM | Admit: 2012-06-27 | Discharge: 2012-06-28 | Disposition: A | Discharge status: Home / Self Care | Payer: Medicare Other | Attending: Emergency Medicine | Admitting: Emergency Medicine

## 2012-06-27 ENCOUNTER — Encounter (HOSPITAL_COMMUNITY): Payer: Self-pay | Admitting: Gastroenterology

## 2012-06-27 ENCOUNTER — Emergency Department (HOSPITAL_COMMUNITY): Payer: Medicare Other

## 2012-06-27 ENCOUNTER — Encounter: Payer: Self-pay | Admitting: Vascular Surgery

## 2012-06-27 DIAGNOSIS — Z792 Long term (current) use of antibiotics: Secondary | ICD-10-CM | POA: Insufficient documentation

## 2012-06-27 DIAGNOSIS — I120 Hypertensive chronic kidney disease with stage 5 chronic kidney disease or end stage renal disease: Secondary | ICD-10-CM | POA: Insufficient documentation

## 2012-06-27 DIAGNOSIS — Z992 Dependence on renal dialysis: Secondary | ICD-10-CM | POA: Insufficient documentation

## 2012-06-27 DIAGNOSIS — Z8669 Personal history of other diseases of the nervous system and sense organs: Secondary | ICD-10-CM | POA: Insufficient documentation

## 2012-06-27 DIAGNOSIS — N186 End stage renal disease: Secondary | ICD-10-CM | POA: Insufficient documentation

## 2012-06-27 DIAGNOSIS — Z8614 Personal history of Methicillin resistant Staphylococcus aureus infection: Secondary | ICD-10-CM | POA: Insufficient documentation

## 2012-06-27 DIAGNOSIS — Z79899 Other long term (current) drug therapy: Secondary | ICD-10-CM | POA: Insufficient documentation

## 2012-06-27 DIAGNOSIS — R531 Weakness: Secondary | ICD-10-CM

## 2012-06-27 DIAGNOSIS — E119 Type 2 diabetes mellitus without complications: Secondary | ICD-10-CM | POA: Insufficient documentation

## 2012-06-27 DIAGNOSIS — Z7982 Long term (current) use of aspirin: Secondary | ICD-10-CM | POA: Insufficient documentation

## 2012-06-27 DIAGNOSIS — R5383 Other fatigue: Secondary | ICD-10-CM | POA: Insufficient documentation

## 2012-06-27 DIAGNOSIS — Z794 Long term (current) use of insulin: Secondary | ICD-10-CM | POA: Insufficient documentation

## 2012-06-27 DIAGNOSIS — K219 Gastro-esophageal reflux disease without esophagitis: Secondary | ICD-10-CM | POA: Insufficient documentation

## 2012-06-27 DIAGNOSIS — Z8739 Personal history of other diseases of the musculoskeletal system and connective tissue: Secondary | ICD-10-CM | POA: Insufficient documentation

## 2012-06-27 DIAGNOSIS — R5381 Other malaise: Secondary | ICD-10-CM | POA: Insufficient documentation

## 2012-06-27 LAB — POCT I-STAT 3, ART BLOOD GAS (G3+)
Acid-Base Excess: 1 mmol/L (ref 0.0–2.0)
Bicarbonate: 27 mEq/L — ABNORMAL HIGH (ref 20.0–24.0)
Patient temperature: 98.7

## 2012-06-27 LAB — CBC WITH DIFFERENTIAL/PLATELET
Basophils Absolute: 0 10*3/uL (ref 0.0–0.1)
Eosinophils Absolute: 0.2 10*3/uL (ref 0.0–0.7)
Eosinophils Relative: 2 % (ref 0–5)
Lymphocytes Relative: 29 % (ref 12–46)
MCH: 32.7 pg (ref 26.0–34.0)
MCV: 98.7 fL (ref 78.0–100.0)
Neutrophils Relative %: 59 % (ref 43–77)
Platelets: 226 10*3/uL (ref 150–400)
RDW: 20.7 % — ABNORMAL HIGH (ref 11.5–15.5)
WBC: 9.1 10*3/uL (ref 4.0–10.5)

## 2012-06-27 LAB — BASIC METABOLIC PANEL
Calcium: 9.4 mg/dL (ref 8.4–10.5)
GFR calc non Af Amer: 5 mL/min — ABNORMAL LOW (ref >90)
Potassium: 4.6 mEq/L (ref 3.5–5.1)
Sodium: 135 mEq/L (ref 135–145)

## 2012-06-27 LAB — TROPONIN I: Troponin I: <0.3 ng/mL (ref <0.30)

## 2012-06-27 LAB — ETHANOL: Alcohol, Ethyl (B): <11 mg/dL (ref 0–11)

## 2012-06-27 NOTE — ED Provider Notes (Signed)
History     CSN: [id number]  Arrival date & time 06/27/12  1620   First MD Initiated Contact with Patient 06/27/12 1623      Chief Complaint  Patient presents with  . Weakness     The history is provided by the patient and medical records.   Patient reports generalized weakness of her upper extremities.  She has bilateral AKA's.  She is on dialysis Monday Wednesday Friday.  Last dialysis was Wednesday.  She reports missing dialysis today because of generalized weakness.  She also reports she was supposed to be in court today but was unable to make it because of generalized weakness.  She was discharged from the hospital yesterday for what sounds like nausea vomiting and abdominal pain.  He was thought that she had gastritis.  She's feeling much better at time of discharge.  She reports during her hospitalization she had no weakness in the right after she was released she became generally weak.  She denies abdominal pain nausea or vomiting at this time.  His no chest pain or shortness of breath.  She's had no fever.  Her symptoms are mild in severity.  She reports she is also generally tired.  She denies back pain   Past Medical History  Diagnosis Date  . GERD (gastroesophageal reflux disease)   . Diabetes mellitus     IDDM  . Hypertension     states has been on med. x "a long time"  . ESRF (end stage renal failure)     dialysis M,W, F; left thigh AV graft  . History of gangrene     left foot  . Hx MRSA infection   . 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 Av fistula placement 10/18/2008    right thigh AV graft  . Finger debridement 06/20/2010    right middle finger  . Finger amputation 05/27/2010    right middle  . Thrombectomy / arteriovenous graft revision 01/14/2010    left superficial femoral artery; left thigh AV graft placement  . 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 Leg amputation below knee 10/11/2009   right  . Central venous catheter insertion 08/06/2009    removal right IJ Diatek cath., placement left IJ Diatek cath.  . 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 Foot amputation 04/04/2009    left transtibial amputation  . Excision / curettage bone cyst talus / calcaneus 03/04/2009    partial calcaneal exc. left; placement wound VAC  . Groin debridement 11/16/2008    right rectus femoris muscle flap to right groin wound; right groing debridement  . Femoral endarterectomy 11/06/2008    right common femoral; embolectomy right femoral artery; removal right femoral AV graft  . Central venous catheter insertion 07/10/2008; 12/31/2006; 01/19/2006; 06/19/2005; 09/19/2004; 09/08/2004    right IJ Diatek catheter  . Av fistula repair 07/08/2008    removal left upper arm AV graft  . Thrombectomy / arteriovenous graft revision 05/11/2007;12/31/2006; 01/18/2006; 12/16/2005    left upper arm  . Dialysis fistula creation 01/20/2007    left upper arm AV graft  . Dialysis fistula creation 01/28/2006    right upper arm AV graft  . Thrombectomy / arteriovenous graft revision 10/26/2005    left forearm AV graft  . Dialysis fistula creation 10/04/2005    left forearm AV graft  . Av fistula repair 07/14/2005    removal  infected right forearm AV graft  . Thrombectomy / arteriovenous graft revision 03/11/2005; 10/20/2004    right forearm AV graft  . Dialysis fistula creation 09/10/2004    right forearm AV graft  . Av fistula placement 07/18/2004    creation left AV fistula, radial to cephalic  . Finger exploration 07/13/2002    and repair left middle finger  . Carpal tunnel release 01/26/2012    Procedure: CARPAL TUNNEL RELEASE;  Surgeon: Nicki Reaper, MD;  Location: [facility];  Service: Orthopedics;  Laterality: Left;  . Esophagogastroduodenoscopy 06/26/2012    Procedure: ESOPHAGOGASTRODUODENOSCOPY (EGD);  Surgeon: Charna Elizabeth, MD;  Location: [facility] END[facility]COPY;  Service: Endoscopy;  Laterality: N/A;     Family History  Problem Relation Age of Onset  . Diabetes Mother   . Heart disease Mother   . Hypertension Mother   . Heart attack Mother 18  . Diabetes Sister   . Hypertension Brother   . Heart attack Brother     History  Substance Use Topics  . Smoking status: Never Smoker   . Smokeless tobacco: Never Used  . Alcohol Use: No    OB History    Grav Para Term Preterm Abortions TAB SAB Ect Mult Living                  Review of Systems  All other systems reviewed and are negative.    Allergies  Percocet and Soap  Home Medications   Current Outpatient Rx  Name Route Sig Dispense Refill  . AMLODIPINE BESYLATE 10 MG PO TABS Oral Take 10 mg by mouth at bedtime.    . ASPIRIN 81 MG PO CHEW Oral Chew 81 mg by mouth daily.    Marland Kitchen CALCIUM ACETATE 667 MG PO CAPS Oral Take 2,001 mg by mouth 3 (three) times daily with meals.    Marland Kitchen GABAPENTIN 100 MG PO CAPS Oral Take 100 mg by mouth at bedtime.    Marland Kitchen GABAPENTIN 100 MG PO CAPS Oral Take 100 mg by mouth at bedtime.    Marland Kitchen HYDROXYZINE HCL 25 MG PO TABS Oral Take 25 mg by mouth daily as needed. For itching    . INSULIN ASPART 100 UNIT/ML [place] SOLN Subcutaneous Inject 4 Units into the skin daily as needed. Per sliding scale.    . INSULIN ASPART PROT & ASPART (70-30) 100 UNIT/ML [place] SUSP Subcutaneous Inject 6-8 Units into the skin 2 (two) times daily with a meal. Take 8 units in the morning and 6 units in the evening    . METOCLOPRAMIDE HCL 5 MG PO TABS Oral Take 5 mg by mouth 3 (three) times daily as needed. For nausea    . MOXIFLOXACIN HCL 400 MG PO TABS Oral Take 400 mg by mouth daily.    Marland Kitchen RENA-VITE PO TABS Oral Take 1 tablet by mouth daily.    Marland Kitchen OMEPRAZOLE 20 MG PO CPDR Oral Take 20 mg by mouth 2 (two) times daily.    Marland Kitchen SEVELAMER CARBONATE 800 MG PO TABS Oral Take 800 mg by mouth 3 (three) times daily with meals.    . TRAMADOL HCL 50 MG PO TABS Oral Take 50 mg by mouth every 6 (six) hours as needed. For pain    . TRIAMCINOLONE  ACETONIDE 0.1 % EX CREA Topical Apply 1 application topically 2 (two) times daily. For rash on arms      BP 150/72  Temp 98.9 F (37.2 C)  Resp 18  SpO2 88%  Physical  Exam  Nursing note and vitals reviewed. Constitutional: She is oriented to person, place, and time. She appears well-developed and well-nourished. No distress.  HENT:  Head: Normocephalic and atraumatic.  Eyes: EOM are normal.  Neck: Normal range of motion. Neck supple.       No C-spine tenderness  Cardiovascular: Normal rate, regular rhythm and normal heart sounds.   Pulmonary/Chest: Effort normal and breath sounds normal.  Abdominal: Soft. She exhibits no distension. There is no tenderness.  Musculoskeletal: Normal range of motion.       5 out of 5 strength in bilateral upper extremity major muscle groups.  5 out of 5 grip strength.  The patient has bilateral AKA's without secondary signs of infection.  No thoracic or lumbar spinal tenderness.  Neurological: She is alert and oriented to person, place, and time.  Skin: Skin is warm and dry.  Psychiatric: She has a normal mood and affect. Judgment normal.    ED Course  Procedures (including critical care time)  Labs Reviewed  CBC WITH DIFFERENTIAL - Abnormal; Notable for the following:    RBC 2.97 (*)     Hemoglobin 9.7 (*)     HCT 29.3 (*)     RDW 20.7 (*)     All other components within normal limits  BASIC METABOLIC PANEL - Abnormal; Notable for the following:    Chloride 95 (*)     Glucose, Bld 264 (*)     BUN 56 (*)     Creatinine, Ser 8.50 (*)     GFR calc non Af Amer 5 (*)     GFR calc Af Amer 6 (*)     All other components within normal limits  TROPONIN I   Dg Chest 2 View  06/27/2012  *RADIOLOGY REPORT*  Clinical Data: Weakness  CHEST - 2 VIEW  Comparison: 06/22/2012  Findings: Decreased lung volume with increase in bibasilar atelectasis.  No definite heart failure or pneumonia.  No pleural effusion.  IMPRESSION: Hypoventilation with mild  bibasilar atelectasis.   Original Report Authenticated By: Camelia Phenes, M.D.     Date: 06/27/2012  Rate: 93  Rhythm: normal sinus rhythm  QRS Axis: normal  Intervals: normal  ST/T Wave abnormalities: normal  Conduction Disutrbances: none  Narrative Interpretation:   Old EKG Reviewed: No significant changes noted     I personally reviewed the imaging tests through PACS system  I reviewed available ER/hospitalization records thought the EMR   1. Weakness       MDM  11:23 PM The patient continues to feel generally weak.  She has normal upper extremities neurologic exam.  CT head is normal.  Given her persistent complaints and the fact that she did seem somewhat sleepy in emergency Department I obtained a blood gas which demonstrates no significant hypercarbia.  The patient be discharged home.  She does need dialysis initiated be old to be arranged to be completed tomorrow.  There is no need for urgent/emergent dialysis tonight.  Her potassium is okay.        Lyanne Co, MD 06/27/12 2325

## 2012-06-27 NOTE — ED Notes (Signed)
Back from xray

## 2012-06-27 NOTE — ED Notes (Signed)
Patient transported to X-ray 

## 2012-06-27 NOTE — ED Notes (Signed)
Patient released from hospital yesterday and after she got home started developing weakness in arms and hands, she states she is unable to hold on to objects, patient's last dialysis treatment on last Friday

## 2012-06-27 NOTE — ED Notes (Signed)
RT came to obtain blood gas, pt in CT, RT will return

## 2012-06-28 ENCOUNTER — Ambulatory Visit (INDEPENDENT_AMBULATORY_CARE_PROVIDER_SITE_OTHER): Payer: Medicare Other | Admitting: Vascular Surgery

## 2012-06-28 ENCOUNTER — Encounter: Payer: Self-pay | Admitting: Vascular Surgery

## 2012-06-28 VITALS — BP 165/79 | HR 108 | Resp 18 | Ht 63.0 in | Wt 180.0 lb

## 2012-06-28 DIAGNOSIS — N186 End stage renal disease: Secondary | ICD-10-CM

## 2012-06-28 NOTE — Progress Notes (Signed)
Patient presents today for evaluation of her left femoral loop graft. 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 She had replacement of the medial limb of this by myself on 04/24/2012. She has continued to do well with healing of this. Since that time the [facility] has been utilizing the lateral arterial limb of the graft and getting successful dialysis with this.  Physical exam the the wounds are all well healed and she has excellent flow through the venous limb as well with no evidence of fluid collection and superficial location of the graft.  I discussed this by telephone with the dialysis center and nurse. I feel it is acceptable to use this venous limb as well starting immediately. I did discuss this with the patient explained the near certainty eventual difficulty with access and that this will be addressed as it arises

## 2012-06-30 LAB — CULTURE, BLOOD (ROUTINE X 2)
Culture: NO GROWTH
Culture: NO GROWTH

## 2012-08-02 ENCOUNTER — Encounter (HOSPITAL_COMMUNITY): Payer: Self-pay | Admitting: Emergency Medicine

## 2012-08-02 DIAGNOSIS — K219 Gastro-esophageal reflux disease without esophagitis: Secondary | ICD-10-CM | POA: Insufficient documentation

## 2012-08-02 DIAGNOSIS — Z7982 Long term (current) use of aspirin: Secondary | ICD-10-CM | POA: Insufficient documentation

## 2012-08-02 DIAGNOSIS — Z8669 Personal history of other diseases of the nervous system and sense organs: Secondary | ICD-10-CM | POA: Insufficient documentation

## 2012-08-02 DIAGNOSIS — Z794 Long term (current) use of insulin: Secondary | ICD-10-CM | POA: Insufficient documentation

## 2012-08-02 DIAGNOSIS — L738 Other specified follicular disorders: Secondary | ICD-10-CM | POA: Insufficient documentation

## 2012-08-02 DIAGNOSIS — Z87898 Personal history of other specified conditions: Secondary | ICD-10-CM | POA: Insufficient documentation

## 2012-08-02 DIAGNOSIS — Z79899 Other long term (current) drug therapy: Secondary | ICD-10-CM | POA: Insufficient documentation

## 2012-08-02 DIAGNOSIS — Z992 Dependence on renal dialysis: Secondary | ICD-10-CM | POA: Insufficient documentation

## 2012-08-02 DIAGNOSIS — N186 End stage renal disease: Secondary | ICD-10-CM | POA: Insufficient documentation

## 2012-08-02 DIAGNOSIS — E119 Type 2 diabetes mellitus without complications: Secondary | ICD-10-CM | POA: Insufficient documentation

## 2012-08-02 DIAGNOSIS — I120 Hypertensive chronic kidney disease with stage 5 chronic kidney disease or end stage renal disease: Secondary | ICD-10-CM | POA: Insufficient documentation

## 2012-08-02 DIAGNOSIS — Z8614 Personal history of Methicillin resistant Staphylococcus aureus infection: Secondary | ICD-10-CM | POA: Insufficient documentation

## 2012-08-02 NOTE — ED Notes (Signed)
Patient got her braids taken out Monday, now she is feeling knots on the back of her head and on her neck.  She is wondering if she has been bit by something.

## 2012-08-03 ENCOUNTER — Emergency Department (HOSPITAL_COMMUNITY)
Admission: EM | Admit: 2012-08-03 | Discharge: 2012-08-03 | Disposition: A | Discharge status: Home / Self Care | Payer: Medicare Other | Attending: Emergency Medicine | Admitting: Emergency Medicine

## 2012-08-03 DIAGNOSIS — L739 Follicular disorder, unspecified: Secondary | ICD-10-CM

## 2012-08-03 MED ORDER — KETOCONAZOLE 1 % EX SHAM: MEDICATED_SHAMPOO | CUTANEOUS | Status: DC

## 2012-08-03 MED ORDER — SULFAMETHOXAZOLE-TRIMETHOPRIM 800-160 MG PO TABS: 1.0000 | ORAL_TABLET | Freq: Two times a day (BID) | ORAL | Status: DC

## 2012-08-03 NOTE — ED Notes (Signed)
Pt states that she walked through some spider webs prior to braids being removed; pt does not know if any spiders were visible; pt states lesions are tender to touch, are itchy and very sore; pain 7/10

## 2012-08-03 NOTE — ED Provider Notes (Signed)
History     CSN: [id number]  Arrival date & time 08/02/12  2328   First MD Initiated Contact with Patient 08/03/12 0053      Chief Complaint  Patient presents with  . Insect Bite   HPI   history provided by the patient. Patient is a 41 year old female with history of hypertension, diabetes and end-stage renal disease on dialysis who presents with complaints of scalp irritation and rash. Patient reports having grades put into her hair last week. She began having some itching and irritation with slight discomfort and burning her scalp during that time. She took her break out on Monday and her sister told her she had several bumps and rash to her scalp. Symptoms have been persistent since that time. She denies any other associated symptoms. Denies any fever, chills or sweats. Denies having similar symptoms previously. sHe has not used any treatments for her symptoms.    Past Medical History  Diagnosis Date  . GERD (gastroesophageal reflux disease)   . Diabetes mellitus     IDDM  . Hypertension     states has been on med. x "a long time"  . ESRF (end stage renal failure)     dialysis M,W, F; left thigh AV graft  . History of gangrene     left foot  . Hx MRSA infection   . 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 Av fistula placement 10/18/2008    right thigh AV graft  . Finger debridement 06/20/2010    right middle finger  . Finger amputation 05/27/2010    right middle  . Thrombectomy / arteriovenous graft revision 01/14/2010    left superficial femoral artery; left thigh AV graft placement  . 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 Leg amputation below knee 10/11/2009    right  . Central venous catheter insertion 08/06/2009    removal right IJ Diatek cath., placement left IJ Diatek cath.  . 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 Foot amputation 04/04/2009    left transtibial amputation  . Excision / curettage  bone cyst talus / calcaneus 03/04/2009    partial calcaneal exc. left; placement wound VAC  . Groin debridement 11/16/2008    right rectus femoris muscle flap to right groin wound; right groing debridement  . Femoral endarterectomy 11/06/2008    right common femoral; embolectomy right femoral artery; removal right femoral AV graft  . Central venous catheter insertion 07/10/2008; 12/31/2006; 01/19/2006; 06/19/2005; 09/19/2004; 09/08/2004    right IJ Diatek catheter  . Av fistula repair 07/08/2008    removal left upper arm AV graft  . Thrombectomy / arteriovenous graft revision 05/11/2007;12/31/2006; 01/18/2006; 12/16/2005    left upper arm  . Dialysis fistula creation 01/20/2007    left upper arm AV graft  . Dialysis fistula creation 01/28/2006    right upper arm AV graft  . Thrombectomy / arteriovenous graft revision 10/26/2005    left forearm AV graft  . Dialysis fistula creation 10/04/2005    left forearm AV graft  . Av fistula repair 07/14/2005    removal infected right forearm AV graft  . Thrombectomy / arteriovenous graft revision 03/11/2005; 10/20/2004    right forearm AV graft  . Dialysis fistula creation 09/10/2004    right forearm AV graft  . Av fistula placement 07/18/2004    creation left AV fistula, radial to cephalic  . Finger exploration  07/13/2002    and repair left middle finger  . Carpal tunnel release 01/26/2012    Procedure: CARPAL TUNNEL RELEASE;  Surgeon: Nicki Reaper, MD;  Location: [facility];  Service: Orthopedics;  Laterality: Left;  . Esophagogastroduodenoscopy 06/26/2012    Procedure: ESOPHAGOGASTRODUODENOSCOPY (EGD);  Surgeon: Charna Elizabeth, MD;  Location: [facility] ENDOSCOPY;  Service: Endoscopy;  Laterality: N/A;    Family History  Problem Relation Age of Onset  . Diabetes Mother   . Heart disease Mother   . Hypertension Mother   . Heart attack Mother 41  . Diabetes Sister   . Hypertension Brother   . Heart attack Brother     History  Substance Use Topics  .  Smoking status: Never Smoker   . Smokeless tobacco: Never Used  . Alcohol Use: No    OB History    Grav Para Term Preterm Abortions TAB SAB Ect Mult Living                  Review of Systems  Constitutional: Negative for fever, chills and diaphoresis.  Respiratory: Negative for shortness of breath.   Cardiovascular: Negative for chest pain.  Gastrointestinal: Negative for abdominal pain.  Skin: Positive for rash.  All other systems reviewed and are negative.    Allergies  Tramadol; Percocet; and Soap  Home Medications   Current Outpatient Rx  Name  Route  Sig  Dispense  Refill  . AMITRIPTYLINE HCL 10 MG PO TABS   Oral   Take 10 mg by mouth at bedtime as needed. sleep         . AMLODIPINE BESYLATE 10 MG PO TABS   Oral   Take 10 mg by mouth at bedtime.         . ASPIRIN 81 MG PO CHEW   Oral   Chew 81 mg by mouth daily.         Marland Kitchen CALCIUM ACETATE 667 MG PO CAPS   Oral   Take 2,001 mg by mouth 3 (three) times daily with meals.         Marland Kitchen GABAPENTIN 100 MG PO CAPS   Oral   Take 100 mg by mouth at bedtime.         Marland Kitchen HYDROXYZINE HCL 25 MG PO TABS   Oral   Take 25 mg by mouth every 8 (eight) hours as needed. For itching         . INSULIN ASPART 100 UNIT/ML [place] SOLN   Subcutaneous   Inject 1-6 Units into the skin 3 (three) times daily after meals. Per sliding scale.         . INSULIN ASPART PROT & ASPART (70-30) 100 UNIT/ML [place] SUSP   Subcutaneous   Inject 6-8 Units into the skin 2 (two) times daily with a meal. Take 8 units in the morning and 6 units in the evening         . METOCLOPRAMIDE HCL 5 MG PO TABS   Oral   Take 5 mg by mouth 3 (three) times daily as needed. For nausea         . RENA-VITE PO TABS   Oral   Take 1 tablet by mouth daily.         Marland Kitchen OMEPRAZOLE 20 MG PO CPDR   Oral   Take 20 mg by mouth 2 (two) times daily.         Marland Kitchen SEVELAMER CARBONATE 800 MG PO TABS   Oral  Take 800 mg by mouth 3 (three) times daily with  meals.         . TRIAMCINOLONE ACETONIDE 0.1 % EX CREA   Topical   Apply 1 application topically 2 (two) times daily. For rash on arms         . BD INSULIN SYRINGE ULTRAFINE 30G X 1/2" 0.3 ML MISC                 BP 187/91  Pulse 97  Temp 98.6 F (37 C) (Oral)  Resp 14  SpO2 96%  Physical Exam  Nursing note and vitals reviewed. Constitutional: She is oriented to person, place, and time. She appears well-developed and well-nourished. No distress.  HENT:  Head: Normocephalic.  Cardiovascular: Normal rate and regular rhythm.   Pulmonary/Chest: Effort normal and breath sounds normal. No respiratory distress. She has no wheezes.  Neurological: She is alert and oriented to person, place, and time.  Skin: Skin is warm and dry. Rash noted.       Multiple pustules over the hair follicles on scalp. There is a very mild generalized swelling.  Psychiatric: She has a normal mood and affect. Her behavior is normal.    ED Course  Procedures      1. Folliculitis       MDM  1:30AM patient seen and evaluated. Patient is well-appearing in no acute distress. She has multiple pustules to the scalp over the follicles. She reports slight tenderness. Differential diagnosis includes possible folliculitis versus allergic irritation to the braids versus fungal issue. At this time will give prescription for Bactrim and recommend a antifungal shampoo. she has taken out braids in her hair.        Angus Seller, [place] 08/04/12 [phone]

## 2012-08-04 NOTE — ED Provider Notes (Signed)
  Medical screening examination/treatment/procedure(s) were performed by non-physician practitioner and as supervising physician I was immediately available for consultation/collaboration.    Braysen Cloward D Chiamaka Latka, MD 08/04/12 0152 

## 2012-09-06 ENCOUNTER — Ambulatory Visit: Payer: Medicare Other | Admitting: Infectious Disease

## 2012-09-15 ENCOUNTER — Other Ambulatory Visit (HOSPITAL_COMMUNITY): Payer: Self-pay | Admitting: Nephrology

## 2012-09-15 DIAGNOSIS — N186 End stage renal disease: Secondary | ICD-10-CM

## 2012-09-22 ENCOUNTER — Other Ambulatory Visit (HOSPITAL_COMMUNITY): Payer: Self-pay | Admitting: Nephrology

## 2012-09-22 ENCOUNTER — Ambulatory Visit (HOSPITAL_COMMUNITY)
Admission: RE | Admit: 2012-09-22 | Discharge: 2012-09-22 | Disposition: A | Discharge status: Home / Self Care | Payer: Medicare Other | Source: Ambulatory Visit | Attending: Nephrology | Admitting: Nephrology

## 2012-09-22 DIAGNOSIS — Z992 Dependence on renal dialysis: Secondary | ICD-10-CM | POA: Insufficient documentation

## 2012-09-22 DIAGNOSIS — N186 End stage renal disease: Secondary | ICD-10-CM

## 2012-09-22 DIAGNOSIS — I871 Compression of vein: Secondary | ICD-10-CM | POA: Insufficient documentation

## 2012-09-22 DIAGNOSIS — T82898A Other specified complication of vascular prosthetic devices, implants and grafts, initial encounter: Secondary | ICD-10-CM | POA: Insufficient documentation

## 2012-09-22 DIAGNOSIS — I120 Hypertensive chronic kidney disease with stage 5 chronic kidney disease or end stage renal disease: Secondary | ICD-10-CM | POA: Insufficient documentation

## 2012-09-22 DIAGNOSIS — E119 Type 2 diabetes mellitus without complications: Secondary | ICD-10-CM | POA: Insufficient documentation

## 2012-09-22 MED ORDER — IOHEXOL 300 MG/ML  SOLN
100.0000 mL | Freq: Once | INTRAMUSCULAR | Status: AC | PRN
  Administered 2012-09-22: 60 mL via INTRAVENOUS

## 2012-09-22 NOTE — H&P (Signed)
HPI: [name] is an 42 y.o. female with ESRD and a left thigh AVG and has been having poor access flows with HD. She is referred for shuntogram which finds a venous stenosis that is amenable to PTA. She has had this procedure in the past, 1/13, and is familiar. She denies changes to PMHx and meds, reviewed as below.  Past Medical History:  Past Medical History  Diagnosis Date  . GERD (gastroesophageal reflux disease)   . Diabetes mellitus     IDDM  . Hypertension     states has been on med. x "a long time"  . ESRF (end stage renal failure)     dialysis M,W, F; left thigh AV graft  . History of gangrene     left foot  . Hx MRSA infection   . Carpal tunnel syndrome of left wrist 12/2011    Past Surgical History:  Past Surgical History  Procedure Date  . Av fistula placement 10/18/2008    right thigh AV graft  . Finger debridement 06/20/2010    right middle finger  . Finger amputation 05/27/2010    right middle  . Thrombectomy / arteriovenous graft revision 01/14/2010    left superficial femoral artery; left thigh AV graft placement  . 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 Leg amputation below knee 10/11/2009    right  . Central venous catheter insertion 08/06/2009    removal right IJ Diatek cath., placement left IJ Diatek cath.  . 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 Foot amputation 04/04/2009    left transtibial amputation  . Excision / curettage bone cyst talus / calcaneus 03/04/2009    partial calcaneal exc. left; placement wound VAC  . Groin debridement 11/16/2008    right rectus femoris muscle flap to right groin wound; right groing debridement  . Femoral endarterectomy 11/06/2008    right common femoral; embolectomy right femoral artery; removal right femoral AV graft  . Central venous catheter insertion 07/10/2008; 12/31/2006; 01/19/2006; 06/19/2005; 09/19/2004; 09/08/2004    right IJ Diatek catheter  . Av fistula repair 07/08/2008    removal left  upper arm AV graft  . Thrombectomy / arteriovenous graft revision 05/11/2007;12/31/2006; 01/18/2006; 12/16/2005    left upper arm  . Dialysis fistula creation 01/20/2007    left upper arm AV graft  . Dialysis fistula creation 01/28/2006    right upper arm AV graft  . Thrombectomy / arteriovenous graft revision 10/26/2005    left forearm AV graft  . Dialysis fistula creation 10/04/2005    left forearm AV graft  . Av fistula repair 07/14/2005    removal infected right forearm AV graft  . Thrombectomy / arteriovenous graft revision 03/11/2005; 10/20/2004    right forearm AV graft  . Dialysis fistula creation 09/10/2004    right forearm AV graft  . Av fistula placement 07/18/2004    creation left AV fistula, radial to cephalic  . Finger exploration 07/13/2002    and repair left middle finger  . Carpal tunnel release 01/26/2012    Procedure: CARPAL TUNNEL RELEASE;  Surgeon: Nicki Reaper, MD;  Location: [facility];  Service: Orthopedics;  Laterality: Left;  . Esophagogastroduodenoscopy 06/26/2012    Procedure: ESOPHAGOGASTRODUODENOSCOPY (EGD);  Surgeon: Charna Elizabeth, MD;  Location: [facility] ENDOSCOPY;  Service: Endoscopy;  Laterality: N/A;    Family History:  Family History  Problem Relation Age of Onset  . Diabetes Mother   . Heart disease  Mother   . Hypertension Mother   . Heart attack Mother 28  . Diabetes Sister   . Hypertension Brother   . Heart attack Brother     Social History:  reports that she has never smoked. She has never used smokeless tobacco. She reports that she does not drink alcohol or use illicit drugs.  Allergies:  Allergies  Allergen Reactions  . Tramadol Itching  . Percocet (Oxycodone-Acetaminophen) Other (See Comments)    GI UPSET  . Soap Itching    IVORY SOAP    Medications: amLODipine (NORVASC) 10 MG tablet (Taking) Sig - Route: Take 10 mg by mouth at bedtime. - Oral Class: Historical Med Number of times this order has been changed since signing: 2  Order [address] aspirin 81 MG chewable tablet (Taking) Sig - Route: Chew 81 mg by mouth daily. - Oral Class: Historical Med Number of times this order has been changed since signing: 2 Order [address] calcium acetate (PHOSLO) 667 MG capsule (Taking) Sig - Route: Take 2,001 mg by mouth 3 (three) times daily with meals. - Oral Class: Historical Med Number of times this order has been changed since signing: 2 Order [address] gabapentin (NEURONTIN) 100 MG capsule (Taking) Sig - Route: Take 100 mg by mouth at bedtime. - Oral Class: Historical Med Number of times this order has been changed since signing: 2 Order [address] hydrOXYzine (ATARAX/VISTARIL) 25 MG tablet (Taking) Sig - Route: Take 25 mg by mouth every 8 (eight) hours as needed. For itching - Oral Class: Historical Med Number of times this order has been changed since signing: 4 Order [address] insulin aspart (NOVOLOG) 100 UNIT/ML injection (Taking) Sig - Route: Inject 1-6 Units into the skin 3 (three) times daily after meals. Per sliding scale. - Subcutaneous Class: Historical Med Number of times this order has been changed since signing: 4 Order [address] insulin aspart protamine-insulin aspart (NOVOLOG 70/30) (70-30) 100 UNIT/ML injection (Taking) Sig - Route: Inject 6-8 Units into the skin 2 (two) times daily with a meal. Take 8 units in the morning and 6 units in the evening - Subcutaneous Class: Historical Med Number of times this order has been changed since signing: 4 Order [address] metoCLOPramide (REGLAN) 5 MG tablet (Taking) Sig - Route: Take 5 mg by mouth 3 (three) times daily as needed. For nausea - Oral Class: Historical Med Number of times this order has been changed since signing: 2 Order [address] multivitamin (RENA-VIT) TABS tablet (Taking) Sig - Route: Take 1 tablet by mouth daily. - Oral Class: Historical Med Number of times this order has been changed since signing: 2 Order [address] omeprazole (PRILOSEC) 20 MG capsule  (Taking) Sig - Route: Take 20 mg by mouth 2 (two) times daily. - Oral Class: Historical Med Number of times this order has been changed since signing: 2 Order [address] sevelamer (RENVELA) 800 MG tablet (Taking) Sig - Route: Take 800 mg by mouth 3 (three) times daily with meals. - Oral Class: Historical Med Number of times this order has been changed since signing: 2 Order [address] triamcinolone cream (KENALOG) 0.1 % (Taking) Sig - Route: Apply 1 application topically 2 (two) times daily. For rash on arms - Topical Class: Historical Med   Please HPI for pertinent positives, otherwise complete 10 system ROS negative.  Physical Exam: There were no vitals taken for this visit. There is no height or weight on file to calculate BMI.   General Appearance:  Alert, cooperative, no distress, appears stated age  Head:  Normocephalic, without obvious abnormality, atraumatic  ENT: Unremarkable  Neck: Supple, symmetrical, trachea midline, no adenopathy, thyroid: not enlarged, symmetric, no tenderness/mass/nodules  Lungs:   Clear to auscultation bilaterally, no w/r/r, respirations unlabored without use of accessory muscles.  Heart:  Regular rate and rhythm, S1, S2 normal, no murmur, rub or gallop. Carotids 2+ without bruit.  Extremities: (L)AKA, femoral graft palpable.  Pulses: 2+ and symmetric   No results found for this or any previous visit (from the past 48 hour(s)). No results found.  Assessment/Plan ESRD (+)shuntogram for venous stenosis. Discussed completion shuntogram and PTA of venous stenosis. Consent signed in chart  Brayton El PA-C 09/22/2012, 2:18 PM

## 2012-09-22 NOTE — H&P (Signed)
Agree with PA note.    Signed,  Cadin Luka K. Afiya Ferrebee, MD Vascular & Interventional Radiologist [facility]  

## 2012-09-22 NOTE — Procedures (Signed)
Interventional Radiology Procedure Note  Procedure: Successful angioplasty of recurrent venous anastomotic stenosis to 7 mm. Complications: None Recommendations: - Resume dialysis - Return to IR for decreased access flows  Signed,  Sterling Big, MD Vascular & Interventional Radiologist [facility]

## 2012-10-12 ENCOUNTER — Encounter (HOSPITAL_COMMUNITY): Payer: Self-pay | Admitting: *Deleted

## 2012-10-12 ENCOUNTER — Emergency Department (HOSPITAL_COMMUNITY): Payer: Medicare Other

## 2012-10-12 ENCOUNTER — Emergency Department (HOSPITAL_COMMUNITY)
Admission: EM | Admit: 2012-10-12 | Discharge: 2012-10-12 | Disposition: A | Discharge status: Home / Self Care | Payer: Medicare Other | Attending: Emergency Medicine | Admitting: Emergency Medicine

## 2012-10-12 DIAGNOSIS — S0093XA Contusion of unspecified part of head, initial encounter: Secondary | ICD-10-CM

## 2012-10-12 DIAGNOSIS — Z8614 Personal history of Methicillin resistant Staphylococcus aureus infection: Secondary | ICD-10-CM | POA: Insufficient documentation

## 2012-10-12 DIAGNOSIS — Z8739 Personal history of other diseases of the musculoskeletal system and connective tissue: Secondary | ICD-10-CM | POA: Insufficient documentation

## 2012-10-12 DIAGNOSIS — K219 Gastro-esophageal reflux disease without esophagitis: Secondary | ICD-10-CM | POA: Insufficient documentation

## 2012-10-12 DIAGNOSIS — S0003XA Contusion of scalp, initial encounter: Secondary | ICD-10-CM | POA: Insufficient documentation

## 2012-10-12 DIAGNOSIS — Z7982 Long term (current) use of aspirin: Secondary | ICD-10-CM | POA: Insufficient documentation

## 2012-10-12 DIAGNOSIS — Y9289 Other specified places as the place of occurrence of the external cause: Secondary | ICD-10-CM | POA: Insufficient documentation

## 2012-10-12 DIAGNOSIS — N186 End stage renal disease: Secondary | ICD-10-CM | POA: Insufficient documentation

## 2012-10-12 DIAGNOSIS — E109 Type 1 diabetes mellitus without complications: Secondary | ICD-10-CM | POA: Insufficient documentation

## 2012-10-12 DIAGNOSIS — Z992 Dependence on renal dialysis: Secondary | ICD-10-CM | POA: Insufficient documentation

## 2012-10-12 DIAGNOSIS — W19XXXA Unspecified fall, initial encounter: Secondary | ICD-10-CM

## 2012-10-12 DIAGNOSIS — Z79899 Other long term (current) drug therapy: Secondary | ICD-10-CM | POA: Insufficient documentation

## 2012-10-12 DIAGNOSIS — S239XXA Sprain of unspecified parts of thorax, initial encounter: Secondary | ICD-10-CM | POA: Insufficient documentation

## 2012-10-12 DIAGNOSIS — W050XXA Fall from non-moving wheelchair, initial encounter: Secondary | ICD-10-CM | POA: Insufficient documentation

## 2012-10-12 DIAGNOSIS — I120 Hypertensive chronic kidney disease with stage 5 chronic kidney disease or end stage renal disease: Secondary | ICD-10-CM | POA: Insufficient documentation

## 2012-10-12 DIAGNOSIS — Z794 Long term (current) use of insulin: Secondary | ICD-10-CM | POA: Insufficient documentation

## 2012-10-12 DIAGNOSIS — Y9389 Activity, other specified: Secondary | ICD-10-CM | POA: Insufficient documentation

## 2012-10-12 MED ORDER — HYDROCODONE-ACETAMINOPHEN 5-325 MG PO TABS
2.0000 | ORAL_TABLET | Freq: Once | ORAL | Status: AC
  Administered 2012-10-12: 2 via ORAL
  Filled 2012-10-12: qty 2

## 2012-10-12 MED ORDER — HYDROCODONE-ACETAMINOPHEN 5-325 MG PO TABS: 1.0000 | ORAL_TABLET | ORAL | Status: DC | PRN

## 2012-10-12 NOTE — ED Provider Notes (Signed)
History     CSN: [id number]  Arrival date & time 10/12/12  1203   First MD Initiated Contact with Patient 10/12/12 1229      Chief Complaint  Patient presents with  . Fall    (Consider location/radiation/quality/duration/timing/severity/associated sxs/prior treatment) HPI Comments: 42 y/o female presents to the ER with c/o of head pain and back pain following a fall when her wheelchair flipped backwards earlier today. She states that she was getting off her transport vehicle for dialysis when the wheelchair flipped, landing her head and upper back against a hard metal surface.  She immediately had a headache, for which she was given Tylenol at her dialysis appointment. The headache subsided, but she now has point tenderness over the area of impact on her skull and on her upper thoracic spine.  She says the pain has worsened since the incident happened this morning. Nothing makes the pain worse.  She ranks the pain at a 10/10.  The patient denies any vision changes, nausea, or vomiting.  She has multiple comorbidities including amputation of both legs, kidney disease, diabetes, and hypertension.  She is allergic to tramadol, percocet, and soap.   The history is provided by the patient.    Past Medical History  Diagnosis Date  . GERD (gastroesophageal reflux disease)   . Diabetes mellitus     IDDM  . Hypertension     states has been on med. x "a long time"  . ESRF (end stage renal failure)     dialysis M,W, F; left thigh AV graft  . History of gangrene     left foot  . Hx MRSA infection   . Carpal tunnel syndrome of left wrist 12/2011    Past Surgical History  Procedure Laterality Date  . Av fistula placement  10/18/2008    right thigh AV graft  . Finger debridement  06/20/2010    right middle finger  . Finger amputation  05/27/2010    right middle  . Thrombectomy / arteriovenous graft revision  01/14/2010    left superficial femoral artery; left thigh AV graft placement  .  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 Leg amputation below knee  10/11/2009    right  . Central venous catheter insertion  08/06/2009    removal right IJ Diatek cath., placement left IJ Diatek cath.  . Above knee leg amputation  05/21/2009    left  . Foot amputation  04/04/2009    left transtibial amputation  . Excision / curettage bone cyst talus / calcaneus  03/04/2009    partial calcaneal exc. left; placement wound VAC  . Groin debridement  11/16/2008    right rectus femoris muscle flap to right groin wound; right groing debridement  . Femoral endarterectomy  11/06/2008    right common femoral; embolectomy right femoral artery; removal right femoral AV graft  . Central venous catheter insertion  07/10/2008; 12/31/2006; 01/19/2006; 06/19/2005; 09/19/2004; 09/08/2004    right IJ Diatek catheter  . Av fistula repair  07/08/2008    removal left upper arm AV graft  . Thrombectomy / arteriovenous graft revision  05/11/2007;12/31/2006; 01/18/2006; 12/16/2005    left upper arm  . Dialysis fistula creation  01/20/2007    left upper arm AV graft  . Dialysis fistula creation  01/28/2006    right upper arm AV graft  . Thrombectomy / arteriovenous graft revision  10/26/2005    left forearm AV graft  . Dialysis fistula creation  10/04/2005    left forearm AV graft  . Av fistula repair  07/14/2005    removal infected right forearm AV graft  . Thrombectomy / arteriovenous graft revision  03/11/2005; 10/20/2004    right forearm AV graft  . Dialysis fistula creation  09/10/2004    right forearm AV graft  . Av fistula placement  07/18/2004    creation left AV fistula, radial to cephalic  . Finger exploration  07/13/2002    and repair left middle finger  . Carpal tunnel release  01/26/2012    Procedure: CARPAL TUNNEL RELEASE;  Surgeon: Nicki Reaper, MD;  Location: [facility];  Service: Orthopedics;  Laterality: Left;  . Esophagogastroduodenoscopy  06/26/2012    Procedure:  ESOPHAGOGASTRODUODENOSCOPY (EGD);  Surgeon: Charna Elizabeth, MD;  Location: [facility] ENDOSCOPY;  Service: Endoscopy;  Laterality: N/A;    Family History  Problem Relation Age of Onset  . Diabetes Mother   . Heart disease Mother   . Hypertension Mother   . Heart attack Mother 38  . Diabetes Sister   . Hypertension Brother   . Heart attack Brother     History  Substance Use Topics  . Smoking status: Never Smoker   . Smokeless tobacco: Never Used  . Alcohol Use: No    OB History   Grav Para Term Preterm Abortions TAB SAB Ect Mult Living                  Review of Systems  Constitutional: Negative.  Negative for fever and activity change.  HENT: Negative for neck pain and neck stiffness.   Eyes: Negative for visual disturbance.  Gastrointestinal: Negative for nausea, vomiting, abdominal pain and bowel incontinence.  Genitourinary: Negative for hematuria.  Musculoskeletal: Positive for back pain.  Skin: Negative for wound.  Neurological: Positive for headaches. Negative for dizziness, tingling, seizures, loss of consciousness, syncope, light-headedness and numbness.  Psychiatric/Behavioral: Negative for confusion.  All other systems reviewed and are negative.    Allergies  Tramadol; Percocet; and Soap  Home Medications   Current Outpatient Rx  Name  Route  Sig  Dispense  Refill  . acetaminophen (TYLENOL) 500 MG tablet   Oral   Take 1,000 mg by mouth daily as needed for pain.         Marland Kitchen amLODipine (NORVASC) 10 MG tablet   Oral   Take 10 mg by mouth at bedtime.         Marland Kitchen aspirin 81 MG chewable tablet   Oral   Chew 81 mg by mouth daily.         . calcium acetate (PHOSLO) 667 MG capsule   Oral   Take 2,001 mg by mouth 3 (three) times daily with meals.         . gabapentin (NEURONTIN) 100 MG capsule   Oral   Take 100 mg by mouth at bedtime.         . hydrOXYzine (ATARAX/VISTARIL) 25 MG tablet   Oral   Take 25 mg by mouth every 8 (eight) hours as needed. For  itching         . insulin aspart (NOVOLOG) 100 UNIT/ML injection   Subcutaneous   Inject 1-6 Units into the skin 3 (three) times daily after meals. Per sliding scale.         . insulin aspart protamine-insulin aspart (NOVOLOG 70/30) (70-30) 100 UNIT/ML injection   Subcutaneous   Inject 6-8 Units into the skin 2 (two) times daily with  a meal. Take 8 units in the morning and 6 units in the evening         . metoCLOPramide (REGLAN) 5 MG tablet   Oral   Take 5 mg by mouth 3 (three) times daily as needed. For nausea         . multivitamin (RENA-VIT) TABS tablet   Oral   Take 1 tablet by mouth daily.         Marland Kitchen omeprazole (PRILOSEC) 20 MG capsule   Oral   Take 20 mg by mouth 2 (two) times daily.         . sevelamer (RENVELA) 800 MG tablet   Oral   Take 800 mg by mouth 3 (three) times daily with meals.         . triamcinolone cream (KENALOG) 0.1 %   Topical   Apply 1 application topically 2 (two) times daily as needed. For rash on arms         . B-D INS SYR ULTRAFINE .3CC/30G 30G X 1/2" 0.3 ML MISC                 BP 123/72  Pulse 98  Temp(Src) 98 F (36.7 C) (Oral)  Resp 18  SpO2 97%  Physical Exam  Nursing note and vitals reviewed. Constitutional: She is oriented to person, place, and time. She appears well-developed and well-nourished. No distress.  Pt is bilateral amputee of lower limbs  HENT:  Head: Normocephalic.    Nose: Nose normal.  Eyes: Conjunctivae and EOM are normal. Pupils are equal, round, and reactive to light. Right eye exhibits no discharge. Left eye exhibits no discharge.  Neck: Normal range of motion. Neck supple.  Cardiovascular: Normal rate, regular rhythm and normal heart sounds.   Pulmonary/Chest: Effort normal and breath sounds normal. No stridor.  Abdominal: Soft. Bowel sounds are normal.  Musculoskeletal: Normal range of motion. She exhibits tenderness. She exhibits no edema.       Arms: Neurological: She is alert and  oriented to person, place, and time. She has normal strength. No cranial nerve deficit or sensory deficit. Coordination normal.  CN 2-12 intact  Skin: Skin is warm and dry. She is not diaphoretic.  Psychiatric: She has a normal mood and affect. Her speech is normal and behavior is normal. Thought content normal. Cognition and memory are normal.    ED Course  Procedures (including critical care time)  Labs Reviewed - No data to display Dg Thoracic Spine 2 View  10/12/2012  *RADIOLOGY REPORT*  Clinical Data: Fall, mid thoracic pain  THORACIC SPINE - 2 VIEW  Comparison: 06/22/2012  Findings: Normal alignment.  No compression fracture, wedge shaped deformity or focal kyphosis.  Normal paraspinous soft tissues. Mild cardiac enlargement but normal vascularity.  Visualized lungs clear.  No pneumothorax.  IMPRESSION: No acute finding by plain radiography.   Original Report Authenticated By: Judie Petit. Miles Costain, M.D.      1. Fall   2. Head contusion   3. Thoracic sprain       MDM  42 year old female with head contusion and thoracic sprain. No red flags concerning patient's head injury. She is neurologically intact. No vomiting or change in activity. Thoracic x-ray normal. She's feeling better with pain medication. I will discharge her with 15 Vicodin and she'll followup with her PCP. Return precautions discussed. Patient states understanding of plan and is agreeable.        Trevor Mace, PA-C 10/12/12 1358

## 2012-10-12 NOTE — ED Notes (Signed)
Around 6 am pt was on the bus on her way to dialysis.  She was on the lift and her wheelchair flipped back and she hit her head on a metal box.  Denies loc.  Pt continued on and completed dialysis, but the MD at the center told her she needed to come to ED.  Pt states pain has increased and is now in her neck and upper back, though no loc.  No lacs noted.

## 2012-10-13 NOTE — ED Provider Notes (Signed)
Medical screening examination/treatment/procedure(s) were performed by non-physician practitioner and as supervising physician I was immediately available for consultation/collaboration.   Joya Gaskins, MD 10/13/12 [phone]

## 2012-10-14 ENCOUNTER — Telehealth (HOSPITAL_COMMUNITY): Payer: Self-pay | Admitting: *Deleted

## 2012-12-19 ENCOUNTER — Other Ambulatory Visit (HOSPITAL_COMMUNITY): Payer: Self-pay | Admitting: Nephrology

## 2012-12-19 DIAGNOSIS — N186 End stage renal disease: Secondary | ICD-10-CM

## 2012-12-22 ENCOUNTER — Other Ambulatory Visit (HOSPITAL_COMMUNITY): Payer: Self-pay | Admitting: Nephrology

## 2012-12-22 ENCOUNTER — Encounter (HOSPITAL_COMMUNITY): Payer: Self-pay

## 2012-12-22 ENCOUNTER — Ambulatory Visit (HOSPITAL_COMMUNITY)
Admission: RE | Admit: 2012-12-22 | Discharge: 2012-12-22 | Disposition: A | Discharge status: Home / Self Care | Payer: Medicare Other | Source: Ambulatory Visit | Attending: Nephrology | Admitting: Nephrology

## 2012-12-22 DIAGNOSIS — Z992 Dependence on renal dialysis: Secondary | ICD-10-CM | POA: Insufficient documentation

## 2012-12-22 DIAGNOSIS — I120 Hypertensive chronic kidney disease with stage 5 chronic kidney disease or end stage renal disease: Secondary | ICD-10-CM | POA: Insufficient documentation

## 2012-12-22 DIAGNOSIS — S78119A Complete traumatic amputation at level between unspecified hip and knee, initial encounter: Secondary | ICD-10-CM | POA: Insufficient documentation

## 2012-12-22 DIAGNOSIS — E119 Type 2 diabetes mellitus without complications: Secondary | ICD-10-CM | POA: Insufficient documentation

## 2012-12-22 DIAGNOSIS — T82898A Other specified complication of vascular prosthetic devices, implants and grafts, initial encounter: Secondary | ICD-10-CM | POA: Insufficient documentation

## 2012-12-22 DIAGNOSIS — Y832 Surgical operation with anastomosis, bypass or graft as the cause of abnormal reaction of the patient, or of later complication, without mention of misadventure at the time of the procedure: Secondary | ICD-10-CM | POA: Insufficient documentation

## 2012-12-22 DIAGNOSIS — N186 End stage renal disease: Secondary | ICD-10-CM

## 2012-12-22 DIAGNOSIS — K219 Gastro-esophageal reflux disease without esophagitis: Secondary | ICD-10-CM | POA: Insufficient documentation

## 2012-12-22 DIAGNOSIS — I871 Compression of vein: Secondary | ICD-10-CM | POA: Insufficient documentation

## 2012-12-22 MED ORDER — IOHEXOL 300 MG/ML  SOLN
100.0000 mL | Freq: Once | INTRAMUSCULAR | Status: AC | PRN
  Administered 2012-12-22: 50 mL via INTRAVENOUS

## 2012-12-22 NOTE — H&P (Signed)
Agree with PA note.    Signed,  Eliana Lueth K. Rinaldo Macqueen, MD Vascular & Interventional Radiologist [facility]  

## 2012-12-22 NOTE — H&P (Signed)
[name] is an 42 y.o. female.   Chief Complaint: ESRD Left thigh graft shuntogram reveals stenosis Scheduled now for angioplasty/stent placement Last pta 09/22/12; successful HPI: DM; HTN; B AKA  Past Medical History  Diagnosis Date  . GERD (gastroesophageal reflux disease)   . Diabetes mellitus     IDDM  . Hypertension     states has been on med. x "a long time"  . ESRF (end stage renal failure)     dialysis M,W, F; left thigh AV graft  . History of gangrene     left foot  . Hx MRSA infection   . Carpal tunnel syndrome of left wrist 12/2011    Past Surgical History  Procedure Laterality Date  . Av fistula placement  10/18/2008    right thigh AV graft  . Finger debridement  06/20/2010    right middle finger  . Finger amputation  05/27/2010    right middle  . Thrombectomy / arteriovenous graft revision  01/14/2010    left superficial femoral artery; left thigh AV graft placement  . 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 Leg amputation below knee  10/11/2009    right  . Central venous catheter insertion  08/06/2009    removal right IJ Diatek cath., placement left IJ Diatek cath.  . Above knee leg amputation  05/21/2009    left  . Foot amputation  04/04/2009    left transtibial amputation  . Excision / curettage bone cyst talus / calcaneus  03/04/2009    partial calcaneal exc. left; placement wound VAC  . Groin debridement  11/16/2008    right rectus femoris muscle flap to right groin wound; right groing debridement  . Femoral endarterectomy  11/06/2008    right common femoral; embolectomy right femoral artery; removal right femoral AV graft  . Central venous catheter insertion  07/10/2008; 12/31/2006; 01/19/2006; 06/19/2005; 09/19/2004; 09/08/2004    right IJ Diatek catheter  . Av fistula repair  07/08/2008    removal left upper arm AV graft  . Thrombectomy / arteriovenous graft revision  05/11/2007;12/31/2006; 01/18/2006; 12/16/2005    left upper arm  . Dialysis fistula  creation  01/20/2007    left upper arm AV graft  . Dialysis fistula creation  01/28/2006    right upper arm AV graft  . Thrombectomy / arteriovenous graft revision  10/26/2005    left forearm AV graft  . Dialysis fistula creation  10/04/2005    left forearm AV graft  . Av fistula repair  07/14/2005    removal infected right forearm AV graft  . Thrombectomy / arteriovenous graft revision  03/11/2005; 10/20/2004    right forearm AV graft  . Dialysis fistula creation  09/10/2004    right forearm AV graft  . Av fistula placement  07/18/2004    creation left AV fistula, radial to cephalic  . Finger exploration  07/13/2002    and repair left middle finger  . Carpal tunnel release  01/26/2012    Procedure: CARPAL TUNNEL RELEASE;  Surgeon: Nicki Reaper, MD;  Location: [facility];  Service: Orthopedics;  Laterality: Left;  . Esophagogastroduodenoscopy  06/26/2012    Procedure: ESOPHAGOGASTRODUODENOSCOPY (EGD);  Surgeon: Charna Elizabeth, MD;  Location: [facility] ENDOSCOPY;  Service: Endoscopy;  Laterality: N/A;    Family History  Problem Relation Age of Onset  . Diabetes Mother   . Heart disease Mother   . Hypertension Mother   . Heart attack Mother 52  .  Diabetes Sister   . Hypertension Brother   . Heart attack Brother    Social History:  reports that she has never smoked. She has never used smokeless tobacco. She reports that she does not drink alcohol or use illicit drugs.  Allergies:  Allergies  Allergen Reactions  . Tramadol Itching  . Percocet (Oxycodone-Acetaminophen) Other (See Comments)    GI UPSET  . Soap Itching    IVORY SOAP     (Not in a hospital admission)  No results found for this or any previous visit (from the past 48 hour(s)). No results found.  Review of Systems  Respiratory: Negative for shortness of breath.   Cardiovascular: Negative for chest pain.  Gastrointestinal: Negative for nausea, vomiting and abdominal pain.    There were no vitals taken for  this visit. Physical Exam  Constitutional: She is oriented to person, place, and time.  Cardiovascular: Normal rate, regular rhythm and normal heart sounds.   No murmur heard. Respiratory: Effort normal and breath sounds normal. No respiratory distress.  GI: Soft. Bowel sounds are normal. There is tenderness.  Musculoskeletal:  B aka  Neurological: She is alert and oriented to person, place, and time.  Psychiatric: She has a normal mood and affect. Her behavior is normal. Judgment and thought content normal.     Assessment/Plan ESRD Left thigh graft stenosis Scheduled for pta/stent Pt aware of procedure benefits and risks and agreeable to proceed Consent signed and in chart  Charlie Char A 12/22/2012, 2:32 PM

## 2013-04-20 ENCOUNTER — Encounter (HOSPITAL_COMMUNITY): Payer: Self-pay | Admitting: *Deleted

## 2013-04-20 ENCOUNTER — Inpatient Hospital Stay (HOSPITAL_COMMUNITY)
Admission: EM | Admit: 2013-04-20 | Discharge: 2013-04-21 | DRG: 193 | Discharge status: Against Medical Advice | Payer: Medicare Other | Attending: Internal Medicine | Admitting: Internal Medicine

## 2013-04-20 ENCOUNTER — Inpatient Hospital Stay (HOSPITAL_COMMUNITY): Payer: Medicare Other

## 2013-04-20 ENCOUNTER — Emergency Department (HOSPITAL_COMMUNITY): Payer: Medicare Other

## 2013-04-20 DIAGNOSIS — E119 Type 2 diabetes mellitus without complications: Secondary | ICD-10-CM

## 2013-04-20 DIAGNOSIS — Z794 Long term (current) use of insulin: Secondary | ICD-10-CM

## 2013-04-20 DIAGNOSIS — K219 Gastro-esophageal reflux disease without esophagitis: Secondary | ICD-10-CM | POA: Diagnosis present

## 2013-04-20 DIAGNOSIS — N2581 Secondary hyperparathyroidism of renal origin: Secondary | ICD-10-CM | POA: Diagnosis present

## 2013-04-20 DIAGNOSIS — M899 Disorder of bone, unspecified: Secondary | ICD-10-CM | POA: Diagnosis present

## 2013-04-20 DIAGNOSIS — Z7982 Long term (current) use of aspirin: Secondary | ICD-10-CM

## 2013-04-20 DIAGNOSIS — Z79899 Other long term (current) drug therapy: Secondary | ICD-10-CM

## 2013-04-20 DIAGNOSIS — I739 Peripheral vascular disease, unspecified: Secondary | ICD-10-CM | POA: Diagnosis present

## 2013-04-20 DIAGNOSIS — IMO0001 Reserved for inherently not codable concepts without codable children: Secondary | ICD-10-CM | POA: Diagnosis present

## 2013-04-20 DIAGNOSIS — S78119A Complete traumatic amputation at level between unspecified hip and knee, initial encounter: Secondary | ICD-10-CM

## 2013-04-20 DIAGNOSIS — A4902 Methicillin resistant Staphylococcus aureus infection, unspecified site: Secondary | ICD-10-CM

## 2013-04-20 DIAGNOSIS — N186 End stage renal disease: Secondary | ICD-10-CM | POA: Diagnosis present

## 2013-04-20 DIAGNOSIS — D649 Anemia, unspecified: Secondary | ICD-10-CM | POA: Diagnosis present

## 2013-04-20 DIAGNOSIS — Z8614 Personal history of Methicillin resistant Staphylococcus aureus infection: Secondary | ICD-10-CM

## 2013-04-20 DIAGNOSIS — I120 Hypertensive chronic kidney disease with stage 5 chronic kidney disease or end stage renal disease: Secondary | ICD-10-CM | POA: Diagnosis present

## 2013-04-20 DIAGNOSIS — J189 Pneumonia, unspecified organism: Principal | ICD-10-CM | POA: Diagnosis present

## 2013-04-20 DIAGNOSIS — Z992 Dependence on renal dialysis: Secondary | ICD-10-CM

## 2013-04-20 LAB — CBC WITH DIFFERENTIAL/PLATELET
Basophils Absolute: 0 10*3/uL (ref 0.0–0.1)
Basophils Relative: 0 % (ref 0–1)
Eosinophils Absolute: 0.1 10*3/uL (ref 0.0–0.7)
Eosinophils Relative: 1 % (ref 0–5)
HCT: 30.5 % — ABNORMAL LOW (ref 36.0–46.0)
MCHC: 34.1 g/dL (ref 30.0–36.0)
Monocytes Absolute: 0.8 10*3/uL (ref 0.1–1.0)
Neutro Abs: 6 10*3/uL (ref 1.7–7.7)
RDW: 15.2 % (ref 11.5–15.5)

## 2013-04-20 LAB — CREATININE, SERUM
Creatinine, Ser: 3.86 mg/dL — ABNORMAL HIGH (ref 0.50–1.10)
GFR calc Af Amer: 15 mL/min — ABNORMAL LOW
GFR calc non Af Amer: 13 mL/min — ABNORMAL LOW

## 2013-04-20 LAB — POCT I-STAT, CHEM 8
Calcium, Ion: 1.16 mmol/L (ref 1.12–1.23)
Chloride: 95 mEq/L — ABNORMAL LOW (ref 96–112)
HCT: 34 % — ABNORMAL LOW (ref 36.0–46.0)
Hemoglobin: 11.6 g/dL — ABNORMAL LOW (ref 12.0–15.0)
TCO2: 30 mmol/L (ref 0–100)

## 2013-04-20 LAB — CBC
Platelets: 232 10*3/uL (ref 150–400)
RDW: 15 % (ref 11.5–15.5)
WBC: 10.1 10*3/uL (ref 4.0–10.5)

## 2013-04-20 LAB — MRSA PCR SCREENING: MRSA by PCR: NEGATIVE

## 2013-04-20 LAB — GLUCOSE, CAPILLARY
Glucose-Capillary: 204 mg/dL — ABNORMAL HIGH (ref 70–99)
Glucose-Capillary: 226 mg/dL — ABNORMAL HIGH (ref 70–99)
Glucose-Capillary: 175 mg/dL — ABNORMAL HIGH (ref 70–99)

## 2013-04-20 MED ORDER — CEFEPIME HCL 2 G IJ SOLR
2.0000 g | Freq: Once | INTRAMUSCULAR | Status: AC
  Administered 2013-04-20: 2 g via INTRAVENOUS
  Filled 2013-04-20: qty 2

## 2013-04-20 MED ORDER — ASPIRIN 81 MG PO CHEW
81.0000 mg | CHEWABLE_TABLET | Freq: Every day | ORAL | Status: DC
  Administered 2013-04-20 – 2013-04-21 (×2): 81 mg via ORAL
  Filled 2013-04-20 (×2): qty 1

## 2013-04-20 MED ORDER — RENA-VITE PO TABS
1.0000 | ORAL_TABLET | Freq: Every day | ORAL | Status: DC
  Administered 2013-04-20 – 2013-04-21 (×2): 1 via ORAL
  Filled 2013-04-20 (×2): qty 1

## 2013-04-20 MED ORDER — SEVELAMER CARBONATE 800 MG PO TABS
2400.0000 mg | ORAL_TABLET | Freq: Three times a day (TID) | ORAL | Status: DC
  Filled 2013-04-20 (×6): qty 3

## 2013-04-20 MED ORDER — SODIUM CHLORIDE 0.9 % IJ SOLN: 10.0000 mL | Freq: Two times a day (BID) | INTRAMUSCULAR | Status: DC

## 2013-04-20 MED ORDER — ALBUTEROL SULFATE HFA 108 (90 BASE) MCG/ACT IN AERS: 2.0000 | INHALATION_SPRAY | RESPIRATORY_TRACT | Status: DC | PRN

## 2013-04-20 MED ORDER — DEXTROSE 5 % IV SOLN
1.0000 g | Freq: Three times a day (TID) | INTRAVENOUS | Status: DC
  Filled 2013-04-20 (×2): qty 1

## 2013-04-20 MED ORDER — HEPARIN SODIUM (PORCINE) 5000 UNIT/ML IJ SOLN
5000.0000 [IU] | Freq: Three times a day (TID) | INTRAMUSCULAR | Status: DC
  Administered 2013-04-20 – 2013-04-21 (×4): 5000 [IU] via SUBCUTANEOUS
  Filled 2013-04-20 (×7): qty 1

## 2013-04-20 MED ORDER — CINACALCET HCL 30 MG PO TABS
60.0000 mg | ORAL_TABLET | Freq: Every day | ORAL | Status: DC
  Administered 2013-04-21: 60 mg via ORAL
  Filled 2013-04-20 (×3): qty 2

## 2013-04-20 MED ORDER — INSULIN ASPART 100 UNIT/ML ~~LOC~~ SOLN
0.0000 [IU] | Freq: Three times a day (TID) | SUBCUTANEOUS | Status: DC
  Administered 2013-04-21: 5 [IU] via SUBCUTANEOUS

## 2013-04-20 MED ORDER — CALCIUM ACETATE 667 MG PO CAPS
2001.0000 mg | ORAL_CAPSULE | Freq: Three times a day (TID) | ORAL | Status: DC
  Filled 2013-04-20 (×6): qty 3

## 2013-04-20 MED ORDER — CEFTRIAXONE SODIUM 1 G IJ SOLR: 1.0000 g | Freq: Once | INTRAMUSCULAR | Status: DC

## 2013-04-20 MED ORDER — AMLODIPINE BESYLATE 10 MG PO TABS
10.0000 mg | ORAL_TABLET | Freq: Every day | ORAL | Status: DC
  Administered 2013-04-20: 10 mg via ORAL
  Filled 2013-04-20 (×2): qty 1

## 2013-04-20 MED ORDER — PANTOPRAZOLE SODIUM 40 MG PO TBEC
40.0000 mg | DELAYED_RELEASE_TABLET | Freq: Every day | ORAL | Status: DC
  Administered 2013-04-20 – 2013-04-21 (×2): 40 mg via ORAL
  Filled 2013-04-20 (×2): qty 1

## 2013-04-20 MED ORDER — PIPERACILLIN-TAZOBACTAM IN DEX 2-0.25 GM/50ML IV SOLN
2.2500 g | Freq: Once | INTRAVENOUS | Status: DC
  Filled 2013-04-20: qty 50

## 2013-04-20 MED ORDER — VANCOMYCIN HCL 10 G IV SOLR
2500.0000 mg | Freq: Once | INTRAVENOUS | Status: AC
  Administered 2013-04-20: 2500 mg via INTRAVENOUS
  Filled 2013-04-20: qty 2500

## 2013-04-20 MED ORDER — GABAPENTIN 100 MG PO CAPS
200.0000 mg | ORAL_CAPSULE | Freq: Every day | ORAL | Status: DC
  Administered 2013-04-20: 200 mg via ORAL
  Filled 2013-04-20 (×2): qty 2

## 2013-04-20 MED ORDER — ONDANSETRON HCL 4 MG/2ML IJ SOLN: 4.0000 mg | Freq: Four times a day (QID) | INTRAMUSCULAR | Status: DC | PRN

## 2013-04-20 MED ORDER — VANCOMYCIN HCL IN DEXTROSE 1-5 GM/200ML-% IV SOLN
1000.0000 mg | INTRAVENOUS | Status: DC
  Filled 2013-04-20: qty 200

## 2013-04-20 MED ORDER — METOCLOPRAMIDE HCL 5 MG PO TABS
5.0000 mg | ORAL_TABLET | Freq: Three times a day (TID) | ORAL | Status: DC
  Administered 2013-04-20 – 2013-04-21 (×3): 5 mg via ORAL
  Filled 2013-04-20 (×6): qty 1

## 2013-04-20 MED ORDER — CINACALCET HCL 30 MG PO TABS: 60.0000 mg | ORAL_TABLET | Freq: Every day | ORAL | Status: DC

## 2013-04-20 MED ORDER — SODIUM CHLORIDE 0.9 % IJ SOLN
10.0000 mL | INTRAMUSCULAR | Status: DC | PRN
  Administered 2013-04-20 – 2013-04-21 (×2): 20 mL

## 2013-04-20 MED ORDER — DARBEPOETIN ALFA-POLYSORBATE 40 MCG/0.4ML IJ SOLN
40.0000 ug | INTRAMUSCULAR | Status: DC
  Filled 2013-04-20: qty 0.4

## 2013-04-20 MED ORDER — ALBUTEROL SULFATE (5 MG/ML) 0.5% IN NEBU: 2.5000 mg | INHALATION_SOLUTION | Freq: Four times a day (QID) | RESPIRATORY_TRACT | Status: AC | PRN

## 2013-04-20 MED ORDER — INSULIN ASPART PROT & ASPART (70-30 MIX) 100 UNIT/ML ~~LOC~~ SUSP
8.0000 [IU] | Freq: Every day | SUBCUTANEOUS | Status: DC
  Administered 2013-04-21: 8 [IU] via SUBCUTANEOUS
  Filled 2013-04-20: qty 10

## 2013-04-20 MED ORDER — DEXTROSE 5 % IV SOLN
2.0000 g | INTRAVENOUS | Status: DC
  Filled 2013-04-20: qty 2

## 2013-04-20 MED ORDER — ACETAMINOPHEN 325 MG PO TABS
650.0000 mg | ORAL_TABLET | Freq: Four times a day (QID) | ORAL | Status: DC | PRN
  Administered 2013-04-20: 650 mg via ORAL
  Filled 2013-04-20: qty 2

## 2013-04-20 MED ORDER — DOXERCALCIFEROL 4 MCG/2ML IV SOLN
2.0000 ug | INTRAVENOUS | Status: DC
  Filled 2013-04-20: qty 2

## 2013-04-20 MED ORDER — SODIUM CHLORIDE 0.9 % IV SOLN
125.0000 mg | INTRAVENOUS | Status: DC
  Administered 2013-04-21: 125 mg via INTRAVENOUS
  Filled 2013-04-20 (×2): qty 10

## 2013-04-20 MED ORDER — ONDANSETRON HCL 4 MG PO TABS
4.0000 mg | ORAL_TABLET | Freq: Four times a day (QID) | ORAL | Status: DC | PRN
  Administered 2013-04-20: 4 mg via ORAL

## 2013-04-20 MED ORDER — INSULIN ASPART PROT & ASPART (70-30 MIX) 100 UNIT/ML ~~LOC~~ SUSP
6.0000 [IU] | Freq: Every day | SUBCUTANEOUS | Status: DC
  Administered 2013-04-20: 6 [IU] via SUBCUTANEOUS

## 2013-04-20 NOTE — ED Notes (Signed)
Patient update given to Hessie Diener, RN on 4E.  Patient IV access not obtained by IV team.  Patient admitting MD aware and will put in for central line once on floor.

## 2013-04-20 NOTE — ED Provider Notes (Signed)
Medical screening examination/treatment/procedure(s) were conducted as a shared visit with non-physician practitioner(s) and myself.  I personally evaluated the patient during the encounter  The patient is in no acute distress. She is status post bilateral AKA. There are rales in the bases greater on the right. There is also some faint expiratory wheezing in the upper fields.  Hanley Seamen, MD 04/20/13 [phone]

## 2013-04-20 NOTE — Progress Notes (Signed)
Pt O4x complaints of nausea, Zofran given. Pt sent to IR for IJ.  Will continue to monitor Pt

## 2013-04-20 NOTE — ED Notes (Signed)
Patient states productive cough with yellow, thick, sputum x 1 day.  Patient states she was given antibiotic prescription yesterday at dialysis but has not filled it.

## 2013-04-20 NOTE — Procedures (Signed)
Interventional Radiology Procedure Note  Procedure: Right IJ dul lumen PowerPICC placed with tip in the cavoatrial jxn/upper RA. Complications: None Recommendations: - Routine line care  Signed,  Sterling Big, MD Vascular & Interventional Radiologist [facility]

## 2013-04-20 NOTE — ED Notes (Signed)
IV team arriving at this time

## 2013-04-20 NOTE — ED Notes (Signed)
IV team paged at this time

## 2013-04-20 NOTE — Progress Notes (Signed)
ANTIBIOTIC CONSULT NOTE - INITIAL  Pharmacy Consult for vancomycin and renal adjustment of other antibiotics Indication: pneumonia  Allergies  Allergen Reactions  . Tramadol Itching  . Percocet [Oxycodone-Acetaminophen] Other (See Comments)    GI UPSET  . Soap Itching    IVORY SOAP    Patient Measurements: Weight: 258 lb 2.5 oz (117.1 kg)   Vital Signs: Temp: 97.9 F (36.6 C) (08/21 0502) Temp src: Oral (08/21 0502) BP: 180/89 mmHg (08/21 0730) Pulse Rate: 93 (08/21 0730) Intake/Output from previous day:   Intake/Output from this shift:    Labs:  Recent Labs  04/20/13 0530 04/20/13 0533  WBC 8.7  --   HGB 10.4* 11.6*  PLT 313  --   CREATININE  --  3.30*   The CrCl is unknown because both a height and weight (above a minimum accepted value) are required for this calculation. No results found for this basename: VANCOTROUGH, VANCOPEAK, VANCORANDOM, GENTTROUGH, GENTPEAK, GENTRANDOM, TOBRATROUGH, TOBRAPEAK, TOBRARND, AMIKACINPEAK, AMIKACINTROU, AMIKACIN,  in the last 72 hours   Microbiology: No results found for this or any previous visit (from the past 720 hour(s)).  Medical History: Past Medical History  Diagnosis Date  . GERD (gastroesophageal reflux disease)   . Diabetes mellitus     IDDM  . Hypertension     states has been on med. x "a long time"  . ESRF (end stage renal failure)     dialysis M,W, F; left thigh AV graft  . History of gangrene     left foot  . Hx MRSA infection   . Carpal tunnel syndrome of left wrist 12/2011    Assessment: 42 YOF ESRD patient on HD MWF. To go for HD tomorrow. Patient admitted with increased cough and SOB- had mentioned symptoms to dialysis staff, but did not fill prescription she was given. Pharmacy asked to dose vancomycin for an 8 day course and also renally adjust antibiotics.  Goal of Therapy:  pre-HD level 15-42mcg/mL  Plan:  1. Change cefepime to 2g IV x1 now, then 2g IV qMWF after HD with last dose being  given 8/27 2. Vancomycin loading dose 2500mg  IV x1 now 3. Vancomycin 1000mg  IV qMWF after HD 4. Follow up c/s, clinical progression, HD tolerance and any changes to HD schedule  Nature Vogelsang D. Deundre Thong, PharmD Clinical Pharmacist Pager: [phone] 04/20/2013 9:10 AM

## 2013-04-20 NOTE — ED Provider Notes (Signed)
CSN: [id number]     Arrival date & time 04/20/13  0454 History     First MD Initiated Contact with Patient 04/20/13 [phone]     Chief Complaint  Patient presents with  . Shortness of Breath   (Consider location/radiation/quality/duration/timing/severity/associated sxs/prior Treatment) HPI  42 year old female with history of end-stage renal disease, currently a Monday Wednesday Friday dialysis patient presents complaining of increased shortness of breath. The patient states symptoms started since yesterday. Complaining of cough productive with yellow sputum, having increased shortness of breath due to cough. Symptom seems to persist after receiving dialysis. Patient does not think her symptoms related to fluid overload. Otherwise patient denies fever, headache, chest pain, nausea, diaphoresis, vomiting, diarrhea, abdominal pain, back pain, or rash. Denies any hemoptysis. Her last dialysis was yesterday. Patient did mention that her Dr. has recently prescribed antibiotic however she was unable to fill it. No other specific complaint. Patient is a smoker. Patient is also a insulin-dependent diabetic. No history of asthma or COPD.  Past Medical History  Diagnosis Date  . GERD (gastroesophageal reflux disease)   . Diabetes mellitus     IDDM  . Hypertension     states has been on med. x "a long time"  . ESRF (end stage renal failure)     dialysis M,W, F; left thigh AV graft  . History of gangrene     left foot  . Hx MRSA infection   . Carpal tunnel syndrome of left wrist 12/2011   Past Surgical History  Procedure Laterality Date  . Av fistula placement  10/18/2008    right thigh AV graft  . Finger debridement  06/20/2010    right middle finger  . Finger amputation  05/27/2010    right middle  . Thrombectomy / arteriovenous graft revision  01/14/2010    left superficial femoral artery; left thigh AV graft placement  . 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 Leg amputation below  knee  10/11/2009    right  . Central venous catheter insertion  08/06/2009    removal right IJ Diatek cath., placement left IJ Diatek cath.  . Above knee leg amputation  05/21/2009    left  . Foot amputation  04/04/2009    left transtibial amputation  . Excision / curettage bone cyst talus / calcaneus  03/04/2009    partial calcaneal exc. left; placement wound VAC  . Groin debridement  11/16/2008    right rectus femoris muscle flap to right groin wound; right groing debridement  . Femoral endarterectomy  11/06/2008    right common femoral; embolectomy right femoral artery; removal right femoral AV graft  . Central venous catheter insertion  07/10/2008; 12/31/2006; 01/19/2006; 06/19/2005; 09/19/2004; 09/08/2004    right IJ Diatek catheter  . Av fistula repair  07/08/2008    removal left upper arm AV graft  . Thrombectomy / arteriovenous graft revision  05/11/2007;12/31/2006; 01/18/2006; 12/16/2005    left upper arm  . Dialysis fistula creation  01/20/2007    left upper arm AV graft  . Dialysis fistula creation  01/28/2006    right upper arm AV graft  . Thrombectomy / arteriovenous graft revision  10/26/2005    left forearm AV graft  . Dialysis fistula creation  10/04/2005    left forearm AV graft  . Av fistula repair  07/14/2005    removal infected right forearm AV graft  . Thrombectomy / arteriovenous graft revision  03/11/2005; 10/20/2004    right forearm  AV graft  . Dialysis fistula creation  09/10/2004    right forearm AV graft  . Av fistula placement  07/18/2004    creation left AV fistula, radial to cephalic  . Finger exploration  07/13/2002    and repair left middle finger  . Carpal tunnel release  01/26/2012    Procedure: CARPAL TUNNEL RELEASE;  Surgeon: Nicki Reaper, MD;  Location: [facility];  Service: Orthopedics;  Laterality: Left;  . Esophagogastroduodenoscopy  06/26/2012    Procedure: ESOPHAGOGASTRODUODENOSCOPY (EGD);  Surgeon: Charna Elizabeth, MD;  Location: [facility] ENDOSCOPY;  Service:  Endoscopy;  Laterality: N/A;   Family History  Problem Relation Age of Onset  . Diabetes Mother   . Heart disease Mother   . Hypertension Mother   . Heart attack Mother 55  . Diabetes Sister   . Hypertension Brother   . Heart attack Brother    History  Substance Use Topics  . Smoking status: Never Smoker   . Smokeless tobacco: Never Used  . Alcohol Use: No   OB History   Grav Para Term Preterm Abortions TAB SAB Ect Mult Living                 Review of Systems  All other systems reviewed and are negative.    Allergies  Tramadol; Percocet; and Soap  Home Medications   Current Outpatient Rx  Name  Route  Sig  Dispense  Refill  . acetaminophen (TYLENOL) 500 MG tablet   Oral   Take 1,000 mg by mouth daily as needed for pain.         Marland Kitchen amLODipine (NORVASC) 10 MG tablet   Oral   Take 10 mg by mouth at bedtime.         Marland Kitchen aspirin 81 MG chewable tablet   Oral   Chew 81 mg by mouth daily.         . calcium acetate (PHOSLO) 667 MG capsule   Oral   Take 2,001 mg by mouth 3 (three) times daily with meals.         . gabapentin (NEURONTIN) 100 MG capsule   Oral   Take 100 mg by mouth at bedtime.         Marland Kitchen HYDROcodone-acetaminophen (NORCO/VICODIN) 5-325 MG per tablet   Oral   Take 1-2 tablets by mouth every 4 (four) hours as needed for pain.   10 tablet   0   . hydrOXYzine (ATARAX/VISTARIL) 25 MG tablet   Oral   Take 25 mg by mouth every 8 (eight) hours as needed. For itching         . insulin aspart (NOVOLOG) 100 UNIT/ML injection   Subcutaneous   Inject 1-6 Units into the skin 3 (three) times daily after meals. Per sliding scale.         . insulin aspart protamine-insulin aspart (NOVOLOG 70/30) (70-30) 100 UNIT/ML injection   Subcutaneous   Inject 6-8 Units into the skin 2 (two) times daily with a meal. Take 8 units in the morning and 6 units in the evening         . metoCLOPramide (REGLAN) 5 MG tablet   Oral   Take 5 mg by mouth 3 (three)  times daily as needed. For nausea         . multivitamin (RENA-VIT) TABS tablet   Oral   Take 1 tablet by mouth daily.         Marland Kitchen omeprazole (PRILOSEC) 20 MG capsule  Oral   Take 20 mg by mouth 2 (two) times daily.         . sevelamer (RENVELA) 800 MG tablet   Oral   Take 800 mg by mouth 3 (three) times daily with meals.         . triamcinolone cream (KENALOG) 0.1 %   Topical   Apply 1 application topically 2 (two) times daily as needed. For rash on arms          BP 178/87  Temp(Src) 97.9 F (36.6 C) (Oral)  Resp 18  SpO2 95% Physical Exam  Nursing note and vitals reviewed. Constitutional: She is oriented to person, place, and time. She appears well-developed and well-nourished. No distress.  Awake, alert, nontoxic appearance  HENT:  Head: Atraumatic.  Mouth/Throat: Oropharynx is clear and moist.  Eyes: Conjunctivae are normal. Right eye exhibits no discharge. Left eye exhibits no discharge.  Neck: Neck supple. No JVD present.  No JVD.  Cardiovascular: Normal rate and regular rhythm.   Pulmonary/Chest: Effort normal. No respiratory distress. She has rales (rales heard at base of lung.). She exhibits no tenderness.  Abdominal: Soft. There is no tenderness. There is no rebound.  Musculoskeletal: She exhibits no tenderness.  Bilateral AKA.  Neurological: She is alert and oriented to person, place, and time.  Mental status and motor strength appears intact  Skin: No rash noted.  Psychiatric: She has a normal mood and affect.    ED Course   Procedures (including critical care time)   Date: 04/20/2013  Rate: 84  Rhythm: normal sinus rhythm  QRS Axis: normal  Intervals: QT prolonged  ST/T Wave abnormalities: nonspecific ST/T changes  Conduction Disutrbances:none  Narrative Interpretation:   Old EKG Reviewed: unchanged  6:20 AM Patient presents with productive cough, concerning for healthcare associated pneumonia. She is currently in no acute respiratory  distress. White count is normal. Chest x-ray shows bilateral lung infiltrate. Plan to treat for pneumonia with Rocephin and Zosyn.  Care discussed with attending.  7:24 AM I have consulted with [facility], Dr. Rhona Leavens who agrees to admit pt to obs, tele, team 10, under his care.  Pt is aware of plan.    Labs Reviewed  CBC WITH DIFFERENTIAL - Abnormal; Notable for the following:    RBC 3.70 (*)    Hemoglobin 10.4 (*)    HCT 30.5 (*)    All other components within normal limits  POCT I-STAT, CHEM 8 - Abnormal; Notable for the following:    Potassium 3.2 (*)    Chloride 95 (*)    Creatinine, Ser 3.30 (*)    Glucose, Bld 272 (*)    Hemoglobin 11.6 (*)    HCT 34.0 (*)    All other components within normal limits   Dg Chest 2 View  04/20/2013   *RADIOLOGY REPORT*  Clinical Data: Shortness of breath.  CHEST - 2 VIEW  Comparison: 06/27/2012  Findings: Cardiac enlargement.  Pulmonary vascularity appears normal.  Since the previous study, there is interval development of airspace disease in the left mid lung and both lower lungs.  This could be due to edema or pneumonia.  Probable small pleural effusions.  No pneumothorax.  IMPRESSION: Bilateral parenchymal infiltrates suggesting pneumonia or edema. Small bilateral pleural effusions.  Cardiac enlargement without apparent vascular congestion.   Original Report Authenticated By: Burman Nieves, M.D.   1. HAP (hospital-acquired pneumonia)   2. Dialysis patient     MDM  BP 178/87  Temp(Src) 97.9 F (36.6  C) (Oral)  Resp 18  SpO2 95%  I have reviewed nursing notes and vital signs. I personally reviewed the imaging tests through PACS system  I reviewed available ER/hospitalization records thought the EMR     Fayrene Helper, [place] 04/20/13 0725

## 2013-04-20 NOTE — H&P (Signed)
[facility] History and Physical  [name] VZD:[id number] DOB: Aug 06, 1971 DOA: 04/20/2013  Referring physician: Emergency Department PCP: Billee Cashing, MD  Specialists: Dr. Detterding  Chief Complaint: SOB  HPI: [name] is a 42 y.o. female  With a hx of diabetes, ESRD on MWF HD, and s/p B AKA who presents to the ED with complaints of increased coughing and SOB. The patient had mentioned these sx to the dialysis staff yesterday, who had given her a rx for an unknown abx. The patient did not fill this secondary to "insurance issues." The patient subsequently presented where a CXR was notable for bilateral infiltrates, L>R. No leukocytosis. No fevers. The hospitalist service was consulted for admission.  Review of Systems:  Coughing, sob, no syncope or near syncope, no fevers, remainder of 10pt ros reviewed and are neg  Past Medical History  Diagnosis Date  . GERD (gastroesophageal reflux disease)   . Diabetes mellitus     IDDM  . Hypertension     states has been on med. x "a long time"  . ESRF (end stage renal failure)     dialysis M,W, F; left thigh AV graft  . History of gangrene     left foot  . Hx MRSA infection   . Carpal tunnel syndrome of left wrist 12/2011   Past Surgical History  Procedure Laterality Date  . Av fistula placement  10/18/2008    right thigh AV graft  . Finger debridement  06/20/2010    right middle finger  . Finger amputation  05/27/2010    right middle  . Thrombectomy / arteriovenous graft revision  01/14/2010    left superficial femoral artery; left thigh AV graft placement  . 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 Leg amputation below knee  10/11/2009    right  . Central venous catheter insertion  08/06/2009    removal right IJ Diatek cath., placement left IJ Diatek cath.  . Above knee leg amputation  05/21/2009    left  . Foot amputation  04/04/2009    left transtibial amputation  . Excision / curettage bone cyst  talus / calcaneus  03/04/2009    partial calcaneal exc. left; placement wound VAC  . Groin debridement  11/16/2008    right rectus femoris muscle flap to right groin wound; right groing debridement  . Femoral endarterectomy  11/06/2008    right common femoral; embolectomy right femoral artery; removal right femoral AV graft  . Central venous catheter insertion  07/10/2008; 12/31/2006; 01/19/2006; 06/19/2005; 09/19/2004; 09/08/2004    right IJ Diatek catheter  . Av fistula repair  07/08/2008    removal left upper arm AV graft  . Thrombectomy / arteriovenous graft revision  05/11/2007;12/31/2006; 01/18/2006; 12/16/2005    left upper arm  . Dialysis fistula creation  01/20/2007    left upper arm AV graft  . Dialysis fistula creation  01/28/2006    right upper arm AV graft  . Thrombectomy / arteriovenous graft revision  10/26/2005    left forearm AV graft  . Dialysis fistula creation  10/04/2005    left forearm AV graft  . Av fistula repair  07/14/2005    removal infected right forearm AV graft  . Thrombectomy / arteriovenous graft revision  03/11/2005; 10/20/2004    right forearm AV graft  . Dialysis fistula creation  09/10/2004    right forearm AV graft  . Av fistula placement  07/18/2004    creation left  AV fistula, radial to cephalic  . Finger exploration  07/13/2002    and repair left middle finger  . Carpal tunnel release  01/26/2012    Procedure: CARPAL TUNNEL RELEASE;  Surgeon: Nicki Reaper, MD;  Location: [facility];  Service: Orthopedics;  Laterality: Left;  . Esophagogastroduodenoscopy  06/26/2012    Procedure: ESOPHAGOGASTRODUODENOSCOPY (EGD);  Surgeon: Charna Elizabeth, MD;  Location: [facility] ENDOSCOPY;  Service: Endoscopy;  Laterality: N/A;   Social History:  reports that she has never smoked. She has never used smokeless tobacco. She reports that she does not drink alcohol or use illicit drugs.  where does patient live--home, ALF, SNF? and with whom if at home?  Can patient participate in  ADLs?  Allergies  Allergen Reactions  . Tramadol Itching  . Percocet [Oxycodone-Acetaminophen] Other (See Comments)    GI UPSET  . Soap Itching    IVORY SOAP    Family History  Problem Relation Age of Onset  . Diabetes Mother   . Heart disease Mother   . Hypertension Mother   . Heart attack Mother 82  . Diabetes Sister   . Hypertension Brother   . Heart attack Brother     (be sure to complete)  Prior to Admission medications   Medication Sig Start Date End Date Taking? Authorizing Provider  amLODipine (NORVASC) 10 MG tablet Take 10 mg by mouth at bedtime.   Yes Historical Provider, MD  aspirin 81 MG chewable tablet Chew 81 mg by mouth daily.   Yes Historical Provider, MD  calcium acetate (PHOSLO) 667 MG capsule Take 2,001 mg by mouth 3 (three) times daily with meals.   Yes Historical Provider, MD  cinacalcet (SENSIPAR) 60 MG tablet Take 60 mg by mouth daily.   Yes Historical Provider, MD  gabapentin (NEURONTIN) 100 MG capsule Take 200 mg by mouth at bedtime.    Yes Historical Provider, MD  insulin aspart protamine-insulin aspart (NOVOLOG 70/30) (70-30) 100 UNIT/ML injection Inject 6-8 Units into the skin 2 (two) times daily with a meal. Take 8 units in the morning and 6 units in the evening   Yes Historical Provider, MD  metoCLOPramide (REGLAN) 5 MG tablet Take 5 mg by mouth 3 (three) times daily as needed. For nausea   Yes Historical Provider, MD  omeprazole (PRILOSEC) 20 MG capsule Take 20 mg by mouth 2 (two) times daily.   Yes Historical Provider, MD  sevelamer (RENVELA) 800 MG tablet Take 2,400 mg by mouth 3 (three) times daily with meals.    Yes Historical Provider, MD   Physical Exam: Filed Vitals:   04/20/13 0502 04/20/13 0730  BP: 178/87 180/89  Pulse:  93  Temp: 97.9 F (36.6 C)   TempSrc: Oral   Resp: 18 26  SpO2: 95% 97%     General:  Awake, in nad  Eyes: PERRL B  ENT: membranes moist, dentition fair  Neck: trachea midline, neck  supple  Cardiovascular: regular, s1, s2  Respiratory: normal resp effort, coarse breath sounds bilaterally L>R  Abdomen: soft, obese, nondistended  Skin: no abnormal skin lesions seen  Musculoskeletal: s/p B LE AKA, no clubbing or cyanosis  Psychiatric: mood/affect normal // no auditory/visual hallucinations  Neurologic: cn2-12 grossly intact, strength/sensation intact  Labs on Admission:  Basic Metabolic Panel:  Recent Labs Lab 04/20/13 0533  NA 137  K 3.2*  CL 95*  GLUCOSE 272*  BUN 6  CREATININE 3.30*   Liver Function Tests: No results found for this basename: AST,  ALT, ALKPHOS, BILITOT, PROT, ALBUMIN,  in the last 168 hours No results found for this basename: LIPASE, AMYLASE,  in the last 168 hours No results found for this basename: AMMONIA,  in the last 168 hours CBC:  Recent Labs Lab 04/20/13 0530 04/20/13 0533  WBC 8.7  --   NEUTROABS 6.0  --   HGB 10.4* 11.6*  HCT 30.5* 34.0*  MCV 82.4  --   PLT 313  --    Cardiac Enzymes: No results found for this basename: CKTOTAL, CKMB, CKMBINDEX, TROPONINI,  in the last 168 hours  BNP (last 3 results) No results found for this basename: PROBNP,  in the last 8760 hours CBG: No results found for this basename: GLUCAP,  in the last 168 hours  Radiological Exams on Admission: Dg Chest 2 View  04/20/2013   *RADIOLOGY REPORT*  Clinical Data: Shortness of breath.  CHEST - 2 VIEW  Comparison: 06/27/2012  Findings: Cardiac enlargement.  Pulmonary vascularity appears normal.  Since the previous study, there is interval development of airspace disease in the left mid lung and both lower lungs.  This could be due to edema or pneumonia.  Probable small pleural effusions.  No pneumothorax.  IMPRESSION: Bilateral parenchymal infiltrates suggesting pneumonia or edema. Small bilateral pleural effusions.  Cardiac enlargement without apparent vascular congestion.   Original Report Authenticated By: Burman Nieves, M.D.     Assessment/Plan Principal Problem:   HCAP (healthcare-associated pneumonia) Active Problems:   ESRD (end stage renal disease) on dialysis   Diabetes mellitus   HCAP: - Bilateral infiltrates on cxr - no leukocytosis or fevers - Will treat empirically with cefepime and vanc - No IV access so place IJ central line - Will repeat CXR in AM for interval change - Admit to floor  ESRD: - Due for HD tomorrow - Will consult nephrology - Tolerated usual HD yesterday  DM: - Will continue pt with SSI  DVT prophylaxis: - Cont w/ heparin subQ  Code Status: Full (must indicate code status--if unknown or must be presumed, indicate so) Family Communication: Pt in room (indicate person spoken with, if applicable, with phone number if by telephone) Disposition Plan: Pending (indicate anticipated LOS)  Time spent:  Susana Duell K [facility] Pager [phone]  If 7PM-7AM, please contact night-coverage [email or web address] Password [facility] 04/20/2013, 8:34 AM

## 2013-04-20 NOTE — Consult Note (Signed)
[facility] Renal Consultation Note    Indication for Consultation:  Management of ESRD/hemodialysis; anemia, hypertension/volume and secondary hyperparathyroidism  HPI: [name] is a 42 y.o. female ESRD patient (MWF HD) with past medical history significant for hypertension, diabetes mellitus and bilateral above-knee amputations who presented to the emergency department complaining shortness of breath and cough productive of thick,yellow sputum. She was given a prescription for azithromycin at her dialysis center yesterday but was unable to get it filled.  Chest films show bilateral parenchymal infiltrates and small bilateral pleural effusions. She has been admitted for management of HCAP and we are consulted to coordinate her dialysis needs.  At the time of this encounter, she says she feels fine. She denies cough, SOB or pain.  Given Azithro yest and not filled due to confusion at pharmacy.  (Last HD session was 8/20 and she had been making her dry weight for the most part.)  Past Medical History  Diagnosis Date  . GERD (gastroesophageal reflux disease)   . Diabetes mellitus     IDDM  . Hypertension     states has been on med. x "a long time"  . ESRF (end stage renal failure)     dialysis M,W, F; left thigh AV graft  . History of gangrene     left foot  . Hx MRSA infection   . 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 Pneumonia 04/20/2013   Past Surgical History  Procedure Laterality Date  . Av fistula placement  10/18/2008    right thigh AV graft  . Finger debridement  06/20/2010    right middle finger  . Finger amputation  05/27/2010    right middle  . Thrombectomy / arteriovenous graft revision  01/14/2010    left superficial femoral artery; left thigh AV graft placement  . 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 Leg amputation below knee  10/11/2009    right  . Central venous catheter insertion  08/06/2009    removal right IJ Diatek  cath., placement left IJ Diatek cath.  . Above knee leg amputation  05/21/2009    left  . Foot amputation  04/04/2009    left transtibial amputation  . Excision / curettage bone cyst talus / calcaneus  03/04/2009    partial calcaneal exc. left; placement wound VAC  . Groin debridement  11/16/2008    right rectus femoris muscle flap to right groin wound; right groing debridement  . Femoral endarterectomy  11/06/2008    right common femoral; embolectomy right femoral artery; removal right femoral AV graft  . Central venous catheter insertion  07/10/2008; 12/31/2006; 01/19/2006; 06/19/2005; 09/19/2004; 09/08/2004    right IJ Diatek catheter  . Av fistula repair  07/08/2008    removal left upper arm AV graft  . Thrombectomy / arteriovenous graft revision  05/11/2007;12/31/2006; 01/18/2006; 12/16/2005    left upper arm  . Dialysis fistula creation  01/20/2007    left upper arm AV graft  . Dialysis fistula creation  01/28/2006    right upper arm AV graft  . Thrombectomy / arteriovenous graft revision  10/26/2005    left forearm AV graft  . Dialysis fistula creation  10/04/2005    left forearm AV graft  . Av fistula repair  07/14/2005    removal infected right forearm AV graft  . Thrombectomy / arteriovenous graft revision  03/11/2005; 10/20/2004    right forearm AV graft  . Dialysis fistula creation  09/10/2004    right forearm AV graft  . Av fistula placement  07/18/2004    creation left AV fistula, radial to cephalic  . Finger exploration  07/13/2002    and repair left middle finger  . Carpal tunnel release  01/26/2012    Procedure: CARPAL TUNNEL RELEASE;  Surgeon: Nicki Reaper, MD;  Location: [facility];  Service: Orthopedics;  Laterality: Left;  . Esophagogastroduodenoscopy  06/26/2012    Procedure: ESOPHAGOGASTRODUODENOSCOPY (EGD);  Surgeon: Charna Elizabeth, MD;  Location: [facility] ENDOSCOPY;  Service: Endoscopy;  Laterality: N/A;   Family History  Problem Relation Age of Onset  . Diabetes Mother    . Heart disease Mother   . Hypertension Mother   . Heart attack Mother 74  . Diabetes Sister   . Hypertension Brother   . Heart attack Brother    Social History:  reports that she has never smoked. She has never used smokeless tobacco. She reports that she does not drink alcohol or use illicit drugs. Allergies  Allergen Reactions  . Tramadol Itching  . Percocet [Oxycodone-Acetaminophen] Other (See Comments)    GI UPSET  . Soap Itching    IVORY SOAP   Prior to Admission medications   Medication Sig Start Date End Date Taking? Authorizing Provider  amLODipine (NORVASC) 10 MG tablet Take 10 mg by mouth at bedtime.   Yes Historical Provider, MD  aspirin 81 MG chewable tablet Chew 81 mg by mouth daily.   Yes Historical Provider, MD  calcium acetate (PHOSLO) 667 MG capsule Take 2,001 mg by mouth 3 (three) times daily with meals.   Yes Historical Provider, MD  cinacalcet (SENSIPAR) 60 MG tablet Take 60 mg by mouth daily.   Yes Historical Provider, MD  gabapentin (NEURONTIN) 100 MG capsule Take 200 mg by mouth at bedtime.    Yes Historical Provider, MD  insulin aspart protamine-insulin aspart (NOVOLOG 70/30) (70-30) 100 UNIT/ML injection Inject 6-8 Units into the skin 2 (two) times daily with a meal. Take 8 units in the morning and 6 units in the evening   Yes Historical Provider, MD  metoCLOPramide (REGLAN) 5 MG tablet Take 5 mg by mouth 3 (three) times daily as needed. For nausea   Yes Historical Provider, MD  omeprazole (PRILOSEC) 20 MG capsule Take 20 mg by mouth 2 (two) times daily.   Yes Historical Provider, MD  sevelamer (RENVELA) 800 MG tablet Take 2,400 mg by mouth 3 (three) times daily with meals.    Yes Historical Provider, MD   Current Facility-Administered Medications  Medication Dose Route Frequency Provider Last Rate Last Dose  . albuterol (PROVENTIL HFA;VENTOLIN HFA) 108 (90 BASE) MCG/ACT inhaler 2 puff  2 puff Inhalation Q4H PRN Fayrene Helper, PA-C      . albuterol (PROVENTIL)  (5 MG/ML) 0.5% nebulizer solution 2.5 mg  2.5 mg Nebulization Q6H PRN Fayrene Helper, PA-C      . amLODipine (NORVASC) tablet 10 mg  10 mg Oral QHS Jerald Kief, MD      . aspirin chewable tablet 81 mg  81 mg Oral Daily Jerald Kief, MD      . calcium acetate (PHOSLO) capsule 2,001 mg  2,001 mg Oral TID WC Jerald Kief, MD      . ceFEPIme (MAXIPIME) 2 g in dextrose 5 % 50 mL IVPB  2 g Intravenous Once Lauren Bajbus, RPH      . [START ON 04/21/2013] ceFEPIme (MAXIPIME) 2 g in dextrose 5 % 50 mL  IVPB  2 g Intravenous Q M,W,F-1800 Lauren Bajbus, RPH      . cinacalcet (SENSIPAR) tablet 60 mg  60 mg Oral Q breakfast Jerald Kief, MD      . gabapentin (NEURONTIN) capsule 200 mg  200 mg Oral QHS Jerald Kief, MD      . heparin injection 5,000 Units  5,000 Units Subcutaneous Q8H Jerald Kief, MD      . insulin aspart (novoLOG) injection 0-15 Units  0-15 Units Subcutaneous TID WC Jerald Kief, MD      . insulin aspart protamine- aspart (NOVOLOG MIX 70/30) injection 6 Units  6 Units Subcutaneous Q supper Jerald Kief, MD      . Melene Muller ON 04/21/2013] insulin aspart protamine- aspart (NOVOLOG MIX 70/30) injection 8 Units  8 Units Subcutaneous QAC breakfast Jerald Kief, MD      . metoCLOPramide (REGLAN) tablet 5 mg  5 mg Oral TID AC Jerald Kief, MD      . ondansetron [facility]) injection 4 mg  4 mg Intravenous Q6H PRN Jerald Kief, MD      . ondansetron [facility]) tablet 4 mg  4 mg Oral Q6H PRN Jerald Kief, MD   4 mg at 04/20/13 0932  . pantoprazole (PROTONIX) EC tablet 40 mg  40 mg Oral Daily Jerald Kief, MD      . sevelamer carbonate (RENVELA) tablet 2,400 mg  2,400 mg Oral TID WC Jerald Kief, MD      . vancomycin (VANCOCIN) 2,500 mg in sodium chloride 0.9 % 500 mL IVPB  2,500 mg Intravenous Once Lauren Bajbus, RPH      . [START ON 04/21/2013] vancomycin (VANCOCIN) IVPB 1000 mg/200 mL premix  1,000 mg Intravenous Q M,W,F-2000 Lauren Bajbus, RPH       Labs: Basic Metabolic  Panel:  Recent Labs Lab 04/20/13 0533  NA 137  K 3.2*  CL 95*  GLUCOSE 272*  BUN 6  CREATININE 3.30*   CBC:  Recent Labs Lab 04/20/13 0530 04/20/13 0533 04/20/13 0915  WBC 8.7  --  10.1  NEUTROABS 6.0  --   --   HGB 10.4* 11.6* 10.6*  HCT 30.5* 34.0* 31.0*  MCV 82.4  --  81.6  PLT 313  --  232   Studies/Results: Dg Chest 2 View  04/20/2013   *RADIOLOGY REPORT*  Clinical Data: Shortness of breath.  CHEST - 2 VIEW  Comparison: 06/27/2012  Findings: Cardiac enlargement.  Pulmonary vascularity appears normal.  Since the previous study, there is interval development of airspace disease in the left mid lung and both lower lungs.  This could be due to edema or pneumonia.  Probable small pleural effusions.  No pneumothorax.  IMPRESSION: Bilateral parenchymal infiltrates suggesting pneumonia or edema. Small bilateral pleural effusions.  Cardiac enlargement without apparent vascular congestion.   Original Report Authenticated By: Burman Nieves, M.D.    ROS: 12 pt ROS asked and answered. All systems negative except at described above   Physical Exam: Filed Vitals:   04/20/13 0502 04/20/13 0730 04/20/13 0854 04/20/13 0908  BP: 178/87 180/89  163/90  Pulse:  93  89  Temp: 97.9 F (36.6 C)   97.7 F (36.5 C)  TempSrc: Oral   Oral  Resp: 18 26  22   Weight:   117.1 kg (258 lb 2.5 oz) 81.5 kg (179 lb 10.8 oz)  SpO2: 95% 97%  96%     General: Chronically-ill appearing AAF, alert, cooperative, NAD  Head: Normocephalic, atraumatic, sclera non-icteric, mucus membranes are moist.Severe DM retinopathy ,edentulous Neck: Supple. JVD not elevated. Lungs: Bilateral wheezes to upper/middle lobes. No rales or rhonchi appreciated. Breathing is unlabored.Diminished bs Heart: RRR with S1 S2. No murmurs, rubs, or gallops appreciated.Gr 2/6 M Abdomen: Soft, obese. non-tender, non-distended with normoactive bowel sounds. No rebound/guarding. No obvious abdominal masses. M-S:  Strength and tone  appear normal for age. Lower extremities: Bilateral AKAs. No edema or ischemic changes, no open wounds  Neuro: Alert and oriented X 3. Moves all extremities spontaneously. Psych:  Responds to questions appropriately with a normal affect. Dialysis Access: L thigh AVG + bruit  Dialysis Orders: Center: [facility] on MWF. EDW 78.5 kg HD Bath 2K/2Ca Time 4:00 Heparin 7000u. Access L thigh AVG BFR 400 DFR 800   Hectorol 2 mcg IV/HD Epogen 5600   Units IV/HD  Venofer load to start 8/22 x 8 doses Recent Labs: Hgb 10.3, Phos 4.4, PTH 509, Tsat 17% with Ferritin > 1200 on 7/9  Assessment/Plan: 1. HCAP - Per admit. On empiric vanco and cefepime through 8/27. Clinically well, will lower vol and ? Switch to po ab if afebrile over night 2. ESRD -  MWF, Last tx was 8/20. K+3.2. Next treatment tomorrow to stay on schedule. Lowe vol and resolve effusions. 3. Hypertension/volume  - SBPs 160s-180s on home amlodipine 10 mg. ? Some mild vol excess superimposed on HCAP. UF 4-5L tomorrow as tolerated. 4. Anemia  - Hgb 10.6 on op Epo 5600 u. Aranesp 40 q week here to maintain levels. Last Tsat 17%. Venofer load of 100 mg x 8 doses scheduled to begin op on 8/22. Will start here and continue course as needed upon d/c. Follow CBC 5. Metabolic bone disease -  Ca and Phos controlled outpatient on Phoslo 3 and Renvela 1 ac. Last PTH 509 - On hectorol 2. Sensipar 60 recently added. Continue present regimen. Renal panel 6. Nutrition - Renal diet, multivitamin 7. IDDM - On insulin per primary 8. PVD 9. HPTH on sensipar and Vit D  P AB, HD  Claud Kelp, PA-C [facility] Pager [phone] 04/20/2013, 10:45 AM  I have seen and examined this patient and agree with the plan of care seen, eval,examined,patient counseled. HD inam. Follow temps, resp status. .  Carri Spillers L 04/20/2013, 2:41 PM

## 2013-04-20 NOTE — Progress Notes (Signed)
Utilization Review Completed.   Abiha Lukehart, RN, BSN Nurse Case Manager  [phone]  

## 2013-04-21 ENCOUNTER — Inpatient Hospital Stay (HOSPITAL_COMMUNITY): Payer: Medicare Other

## 2013-04-21 DIAGNOSIS — A4902 Methicillin resistant Staphylococcus aureus infection, unspecified site: Secondary | ICD-10-CM

## 2013-04-21 LAB — GLUCOSE, CAPILLARY: Glucose-Capillary: 234 mg/dL — ABNORMAL HIGH (ref 70–99)

## 2013-04-21 LAB — COMPREHENSIVE METABOLIC PANEL
ALT: 16 U/L (ref 0–35)
AST: 36 U/L (ref 0–37)
Albumin: 3.2 g/dL — ABNORMAL LOW (ref 3.5–5.2)
CO2: 29 mEq/L (ref 19–32)
Calcium: 9.5 mg/dL (ref 8.4–10.5)
Creatinine, Ser: 5.19 mg/dL — ABNORMAL HIGH (ref 0.50–1.10)
GFR calc non Af Amer: 9 mL/min — ABNORMAL LOW (ref >90)
Sodium: 135 mEq/L (ref 135–145)

## 2013-04-21 LAB — CBC
HCT: 29.6 % — ABNORMAL LOW (ref 36.0–46.0)
Hemoglobin: 9.8 g/dL — ABNORMAL LOW (ref 12.0–15.0)
Platelets: 347 10*3/uL (ref 150–400)
RBC: 3.51 MIL/uL — ABNORMAL LOW (ref 3.87–5.11)
RDW: 15.6 % — ABNORMAL HIGH (ref 11.5–15.5)

## 2013-04-21 MED ORDER — LIDOCAINE HCL (PF) 1 % IJ SOLN
2.0000 mL | Freq: Once | INTRAMUSCULAR | Status: AC
  Administered 2013-04-21: 2 mL via INTRADERMAL

## 2013-04-21 MED ORDER — DARBEPOETIN ALFA-POLYSORBATE 40 MCG/0.4ML IJ SOLN
INTRAMUSCULAR | Status: AC
  Administered 2013-04-21: 40 ug via INTRAVENOUS
  Filled 2013-04-21: qty 0.4

## 2013-04-21 MED ORDER — DOXERCALCIFEROL 4 MCG/2ML IV SOLN
INTRAVENOUS | Status: AC
  Administered 2013-04-21: 2 ug via INTRAVENOUS
  Filled 2013-04-21: qty 2

## 2013-04-21 MED ORDER — HEPARIN SODIUM (PORCINE) 1000 UNIT/ML IJ SOLN
7000.0000 [IU] | Freq: Once | INTRAMUSCULAR | Status: AC
  Administered 2013-04-21: 7000 [IU] via INTRAVENOUS

## 2013-04-21 NOTE — Progress Notes (Signed)
Pt told MD that she wants to go home. MD explained to pt about discharge and she wants to go AMA. D/C'd IJ line per MD.

## 2013-04-21 NOTE — Progress Notes (Signed)
Pt refused all discharge teaching says, " I know all about my medicines " Pt told she needed to make a follow up appt to see PCP within 5 days of AMA discharge. Daughter put mother in wheelchair and wheeled her out. Pt has all her belongings.

## 2013-04-21 NOTE — Discharge Summary (Signed)
Physician Discharge Summary  [name] WUJ:[id number] DOB: 25-Nov-1970 DOA: 04/20/2013  PCP: Billee Cashing, MD  Admit date: 04/20/2013 Discharge date: 04/21/2013  Time spent:  Recommendations for Outpatient Follow-up:  1. ESRD HD M/W/F; following dialysis patient began to become belligerent with staff concerning leaving immediately. I counseled patient and her daughter concerning the importance of staying and allowing [place] to ensure she was medically stable for discharge. PATIENT INSISTED SHE LEAVE AMA  2. DM type II; uncontrolled PATIENT INSISTED SHE LEAVE AMA  3. HCPAP; treatment not completed, PATIENT INSISTED SHE LEAVE AMA    Discharge Diagnoses:  Principal Problem:   HCAP (healthcare-associated pneumonia) Active Problems:   ESRD (end stage renal disease) on dialysis   Diabetes mellitus   Discharge Condition: Medical treatment not completed  Diet recommendation: Diabetic  Filed Weights   04/21/13 0421 04/21/13 0735 04/21/13 1200  Weight: 83.6 kg (184 lb 4.9 oz) 83.8 kg (184 lb 11.9 oz) 77.5 kg (170 lb 13.7 oz)    History of present illness:  42 y.o.BF PMHx DM type II, ESRD on HD M/W/F  s/p B AKA, Hx MRSA who presents to the ED with complaints of increased coughing and SOB. The patient had mentioned these sx to the dialysis staff yesterday, who had given her a rx for an unknown abx. The patient did not fill this secondary to "insurance issues." The patient subsequently presented where a CXR was notable for bilateral infiltrates, L>R. No leukocytosis. No fevers. The hospitalist service was consulted for admission. TODAY patient states she wants to leave and should never have been here. Patient did receive HD today.     Discharge Exam: Filed Vitals:   04/21/13 1130 04/21/13 1200 04/21/13 1243 04/21/13 1447  BP: 124/79 125/79 129/73 104/55  Pulse: 93 93 90 86  Temp:  97.5 F (36.4 C) 99 F (37.2 C) 99.1 F (37.3 C)  TempSrc:  Oral Oral Oral  Resp:  18  18 19   Height:      Weight:  77.5 kg (170 lb 13.7 oz)    SpO2:  95% 92% 91%     Discharge Instructions     Medication List    ASK your doctor about these medications       amLODipine 10 MG tablet  Commonly known as:  NORVASC  Take 10 mg by mouth at bedtime.     aspirin 81 MG chewable tablet  Chew 81 mg by mouth daily.     calcium acetate 667 MG capsule  Commonly known as:  PHOSLO  Take 2,001 mg by mouth 3 (three) times daily with meals.     cinacalcet 60 MG tablet  Commonly known as:  SENSIPAR  Take 60 mg by mouth daily.     gabapentin 100 MG capsule  Commonly known as:  NEURONTIN  Take 200 mg by mouth at bedtime.     insulin aspart protamine- aspart (70-30) 100 UNIT/ML injection  Commonly known as:  NOVOLOG MIX 70/30  Inject 6-8 Units into the skin 2 (two) times daily with a meal. Take 8 units in the morning and 6 units in the evening     metoCLOPramide 5 MG tablet  Commonly known as:  REGLAN  Take 5 mg by mouth 3 (three) times daily as needed. For nausea     omeprazole 20 MG capsule  Commonly known as:  PRILOSEC  Take 20 mg by mouth 2 (two) times daily.     sevelamer carbonate 800 MG tablet  Commonly  known as:  RENVELA  Take 2,400 mg by mouth 3 (three) times daily with meals.       Allergies  Allergen Reactions  . Tramadol Itching  . Percocet [Oxycodone-Acetaminophen] Other (See Comments)    GI UPSET  . Soap Itching    IVORY SOAP      The results of significant diagnostics from this hospitalization (including imaging, microbiology, ancillary and laboratory) are listed below for reference.    Significant Diagnostic Studies: Dg Chest 2 View  04/20/2013   *RADIOLOGY REPORT*  Clinical Data: Shortness of breath.  CHEST - 2 VIEW  Comparison: 06/27/2012  Findings: Cardiac enlargement.  Pulmonary vascularity appears normal.  Since the previous study, there is interval development of airspace disease in the left mid lung and both lower lungs.  This could be  due to edema or pneumonia.  Probable small pleural effusions.  No pneumothorax.  IMPRESSION: Bilateral parenchymal infiltrates suggesting pneumonia or edema. Small bilateral pleural effusions.  Cardiac enlargement without apparent vascular congestion.   Original Report Authenticated By: Burman Nieves, M.D.   Ir Fluoro Guide Cv Line Right  04/20/2013   *RADIOLOGY REPORT*  PICC PLACEMENT WITH ULTRASOUND AND FLUOROSCOPIC GUIDANCE  Clinical History: 42 year old female with end-stage renal disease on hemodialysis and can, and infection requiring IV antibiotic use. Right IJ central venous catheter requested.  Fluoroscopy Time: 12 seconds  Procedure:  The right neck and chest was prepped with chlorhexidine, draped in the usual sterile fashion using maximum barrier technique (cap and mask, sterile gown, sterile gloves, large sterile sheet, hand hygiene and cutaneous antiseptic).  Local anesthesia was attained by infiltration with 1% lidocaine.  Ultrasound demonstrated patency of the right internal jugular vein, and this was documented with an image.  Under real-time ultrasound guidance, this vein was accessed with a 21 gauge micropuncture needle and image documentation was performed.  The needle was exchanged over a guidewire for a peel-away sheath through which a 17 cm 5 [place] dual lumen power injectable PICC was advanced, and positioned with its tip at the lower SVC/right atrial junction. Fluoroscopy during the procedure and fluoro spot radiograph confirms appropriate catheter position.  The catheter was flushed, secured to the skin with Prolene sutures, and covered with a sterile dressing.  Complications:  None.  The patient tolerated the procedure well.  IMPRESSION:  Successful placement of a right IJ approach dual lumen PowerPICC with sonographic and fluoroscopic guidance.  The catheter is ready for use.  Signed,  Sterling Big, MD Vascular & Interventional Radiologist [facility]   Original Report  Authenticated By: Malachy Moan, M.D.   Ir US Guide Vasc Access Right  04/20/2013   *RADIOLOGY REPORT*  PICC PLACEMENT WITH ULTRASOUND AND FLUOROSCOPIC GUIDANCE  Clinical History: 42 year old female with end-stage renal disease on hemodialysis and can, and infection requiring IV antibiotic use. Right IJ central venous catheter requested.  Fluoroscopy Time: 12 seconds  Procedure:  The right neck and chest was prepped with chlorhexidine, draped in the usual sterile fashion using maximum barrier technique (cap and mask, sterile gown, sterile gloves, large sterile sheet, hand hygiene and cutaneous antiseptic).  Local anesthesia was attained by infiltration with 1% lidocaine.  Ultrasound demonstrated patency of the right internal jugular vein, and this was documented with an image.  Under real-time ultrasound guidance, this vein was accessed with a 21 gauge micropuncture needle and image documentation was performed.  The needle was exchanged over a guidewire for a peel-away sheath through which a 17 cm 5 [place] dual  lumen power injectable PICC was advanced, and positioned with its tip at the lower SVC/right atrial junction. Fluoroscopy during the procedure and fluoro spot radiograph confirms appropriate catheter position.  The catheter was flushed, secured to the skin with Prolene sutures, and covered with a sterile dressing.  Complications:  None.  The patient tolerated the procedure well.  IMPRESSION:  Successful placement of a right IJ approach dual lumen PowerPICC with sonographic and fluoroscopic guidance.  The catheter is ready for use.  Signed,  Sterling Big, MD Vascular & Interventional Radiologist [facility]   Original Report Authenticated By: Malachy Moan, M.D.   [facility] 1 View  04/21/2013   *RADIOLOGY REPORT*  Clinical Data: End-stage renal disease  PORTABLE CHEST - 1 VIEW  Comparison: 04/20/2013  Findings: Cardiac shadow is stable but mildly enlarged.  Patchy infiltrative  changes are again seen in the bases bilaterally.  A right-sided jugular central line is now noted with catheter tip in the superior right atrium.  No pneumothorax is noted.  IMPRESSION: Stable infiltrative changes bilaterally.  Status post central line placement without complication.   Original Report Authenticated By: Alcide Clever, M.D.    Microbiology: Recent Results (from the past 240 hour(s))  CULTURE, BLOOD (ROUTINE X 2)     Status: None   Collection Time    04/20/13  9:15 AM      Result Value Range Status   Specimen Description BLOOD LEFT HAND   Final   Special Requests     Final   Value: BOTTLES DRAWN AEROBIC AND ANAEROBIC 10CC BLUE, 4CC RED   Culture  Setup Time     Final   Value: 04/20/2013 14:49     Performed at [facility]   Culture     Final   Value:        BLOOD CULTURE RECEIVED NO GROWTH TO DATE CULTURE WILL BE HELD FOR 5 DAYS BEFORE ISSUING A FINAL NEGATIVE REPORT     Performed at [facility]   Report Status PENDING   Incomplete  CULTURE, BLOOD (ROUTINE X 2)     Status: None   Collection Time    04/20/13  9:25 AM      Result Value Range Status   Specimen Description BLOOD RIGHT HAND   Final   Special Requests BOTTLES DRAWN AEROBIC AND ANAEROBIC 10CC   Final   Culture  Setup Time     Final   Value: 04/20/2013 14:48     Performed at [facility]   Culture     Final   Value:        BLOOD CULTURE RECEIVED NO GROWTH TO DATE CULTURE WILL BE HELD FOR 5 DAYS BEFORE ISSUING A FINAL NEGATIVE REPORT     Performed at [facility]   Report Status PENDING   Incomplete  MRSA PCR SCREENING     Status: None   Collection Time    04/20/13  9:36 AM      Result Value Range Status   MRSA by PCR NEGATIVE  NEGATIVE Final   Comment:            The GeneXpert MRSA Assay (FDA     approved for NASAL specimens     only), is one component of a     comprehensive MRSA colonization     surveillance program. It is not     intended to diagnose MRSA      infection nor to guide or  monitor treatment for     MRSA infections.     Labs: Basic Metabolic Panel:  Recent Labs Lab 04/20/13 0533 04/20/13 0915 04/21/13 0530  NA 137  --  135  K 3.2*  --  3.5  CL 95*  --  96  CO2  --   --  29  GLUCOSE 272*  --  242*  BUN 6  --  16  CREATININE 3.30* 3.86* 5.19*  CALCIUM  --   --  9.5   Liver Function Tests:  Recent Labs Lab 04/21/13 0530  AST 36  ALT 16  ALKPHOS 150*  BILITOT 0.4  PROT 7.5  ALBUMIN 3.2*   No results found for this basename: LIPASE, AMYLASE,  in the last 168 hours No results found for this basename: AMMONIA,  in the last 168 hours CBC:  Recent Labs Lab 04/20/13 0530 04/20/13 0533 04/20/13 0915 04/21/13 0530  WBC 8.7  --  10.1 8.4  NEUTROABS 6.0  --   --   --   HGB 10.4* 11.6* 10.6* 9.8*  HCT 30.5* 34.0* 31.0* 29.6*  MCV 82.4  --  81.6 84.3  PLT 313  --  232 347   Cardiac Enzymes: No results found for this basename: CKTOTAL, CKMB, CKMBINDEX, TROPONINI,  in the last 168 hours BNP: BNP (last 3 results) No results found for this basename: PROBNP,  in the last 8760 hours CBG:  Recent Labs Lab 04/20/13 1110 04/20/13 1620 04/20/13 2119 04/21/13 0544  GLUCAP 204* 226* 175* 234*       Signed:  Courteny Egler, J  [facility] 04/21/2013, 3:31 PM

## 2013-04-21 NOTE — Progress Notes (Signed)
Pt wants to go home. DR Joseph Art made aware. Had late lunch refusing some meds. Daughter at bedside.

## 2013-04-21 NOTE — Progress Notes (Signed)
Subjective:  Seen on HD, says is ready to go home feels better.  Has big goal dialed in today for HD.  T max 100.2 Objective Vital signs in last 24 hours: Filed Vitals:   04/21/13 0047 04/21/13 0421 04/21/13 0735 04/21/13 0800  BP: 109/57 140/78 165/86 151/71  Pulse: 90 102 92 91  Temp:  98 F (36.7 C) 99.1 F (37.3 C)   TempSrc:  Oral Oral   Resp: 18 18 13    Height:      Weight:  83.6 kg (184 lb 4.9 oz) 83.8 kg (184 lb 11.9 oz)   SpO2: 96% 92% 96%    Weight change:   Intake/Output Summary (Last 24 hours) at 04/21/13 1610 Last data filed at 04/21/13 0600  Gross per 24 hour  Intake    970 ml  Output      0 ml  Net    970 ml   Assessment/Plan:  1. HCAP - Per admit. On vanc and cefepime. Low grade temp overnight, not hypoxic- pt would like to go home- consider change to PO abx today with discharge 2. ESRD - MWF, Last tx was 8/20. K+3.2. Next treatment tomorrow to stay on schedule. Lower vol and resolve effusions. 3. Hypertension/volume - SBPs 160s-180s on home amlodipine 10 mg. ? Some mild vol excess superimposed on HCAP. UF 4-5L tomorrow as tolerated. 4. Anemia - Hgb now 9.8 on op Epo 5600 u. Aranesp 40 q week here to maintain levels. Last Tsat 17%. Venofer load of 100 mg x 8 doses scheduled to begin op on 8/22. Will start here and continue course as needed upon d/c. Follow CBC 5. Metabolic bone disease - Ca and Phos controlled outpatient on Phoslo 3 and Renvela 1 ac. Last PTH 509 - On hectorol 2. Sensipar 60 recently added. Continue present regimen. Renal panel 6. Nutrition - Renal diet, multivitamin 7. IDDM - On insulin per primary 8. PVD 9. HPTH on sensipar and Vit D 10. Dispo- will follow while here but pt has strong desire to go home- no problem with discharge from my standpoint.  Acheron Sugg A    Labs: Basic Metabolic Panel:  Recent Labs Lab 04/20/13 0533 04/20/13 0915 04/21/13 0530  NA 137  --  135  K 3.2*  --  3.5  CL 95*  --  96  CO2  --   --  29   GLUCOSE 272*  --  242*  BUN 6  --  16  CREATININE 3.30* 3.86* 5.19*  CALCIUM  --   --  9.5   Liver Function Tests:  Recent Labs Lab 04/21/13 0530  AST 36  ALT 16  ALKPHOS 150*  BILITOT 0.4  PROT 7.5  ALBUMIN 3.2*   No results found for this basename: LIPASE, AMYLASE,  in the last 168 hours No results found for this basename: AMMONIA,  in the last 168 hours CBC:  Recent Labs Lab 04/20/13 0530 04/20/13 0533 04/20/13 0915 04/21/13 0530  WBC 8.7  --  10.1 8.4  NEUTROABS 6.0  --   --   --   HGB 10.4* 11.6* 10.6* 9.8*  HCT 30.5* 34.0* 31.0* 29.6*  MCV 82.4  --  81.6 84.3  PLT 313  --  232 347   Cardiac Enzymes: No results found for this basename: CKTOTAL, CKMB, CKMBINDEX, TROPONINI,  in the last 168 hours CBG:  Recent Labs Lab 04/20/13 1110 04/20/13 1620 04/20/13 2119 04/21/13 0544  GLUCAP 204* 226* 175* 234*    Iron Studies:  No results found for this basename: IRON, TIBC, TRANSFERRIN, FERRITIN,  in the last 72 hours Studies/Results: Dg Chest 2 View  04/20/2013   *RADIOLOGY REPORT*  Clinical Data: Shortness of breath.  CHEST - 2 VIEW  Comparison: 06/27/2012  Findings: Cardiac enlargement.  Pulmonary vascularity appears normal.  Since the previous study, there is interval development of airspace disease in the left mid lung and both lower lungs.  This could be due to edema or pneumonia.  Probable small pleural effusions.  No pneumothorax.  IMPRESSION: Bilateral parenchymal infiltrates suggesting pneumonia or edema. Small bilateral pleural effusions.  Cardiac enlargement without apparent vascular congestion.   Original Report Authenticated By: Burman Nieves, M.D.   Ir Fluoro Guide Cv Line Right  04/20/2013   *RADIOLOGY REPORT*  PICC PLACEMENT WITH ULTRASOUND AND FLUOROSCOPIC GUIDANCE  Clinical History: 42 year old female with end-stage renal disease on hemodialysis and can, and infection requiring IV antibiotic use. Right IJ central venous catheter requested.   Fluoroscopy Time: 12 seconds  Procedure:  The right neck and chest was prepped with chlorhexidine, draped in the usual sterile fashion using maximum barrier technique (cap and mask, sterile gown, sterile gloves, large sterile sheet, hand hygiene and cutaneous antiseptic).  Local anesthesia was attained by infiltration with 1% lidocaine.  Ultrasound demonstrated patency of the right internal jugular vein, and this was documented with an image.  Under real-time ultrasound guidance, this vein was accessed with a 21 gauge micropuncture needle and image documentation was performed.  The needle was exchanged over a guidewire for a peel-away sheath through which a 17 cm 5 [place] dual lumen power injectable PICC was advanced, and positioned with its tip at the lower SVC/right atrial junction. Fluoroscopy during the procedure and fluoro spot radiograph confirms appropriate catheter position.  The catheter was flushed, secured to the skin with Prolene sutures, and covered with a sterile dressing.  Complications:  None.  The patient tolerated the procedure well.  IMPRESSION:  Successful placement of a right IJ approach dual lumen PowerPICC with sonographic and fluoroscopic guidance.  The catheter is ready for use.  Signed,  Sterling Big, MD Vascular & Interventional Radiologist [facility] Radiology   Original Report Authenticated By: Malachy Moan, M.D.   Ir US Guide Vasc Access Right  04/20/2013   *RADIOLOGY REPORT*  PICC PLACEMENT WITH ULTRASOUND AND FLUOROSCOPIC GUIDANCE  Clinical History: 42 year old female with end-stage renal disease on hemodialysis and can, and infection requiring IV antibiotic use. Right IJ central venous catheter requested.  Fluoroscopy Time: 12 seconds  Procedure:  The right neck and chest was prepped with chlorhexidine, draped in the usual sterile fashion using maximum barrier technique (cap and mask, sterile gown, sterile gloves, large sterile sheet, hand hygiene and cutaneous  antiseptic).  Local anesthesia was attained by infiltration with 1% lidocaine.  Ultrasound demonstrated patency of the right internal jugular vein, and this was documented with an image.  Under real-time ultrasound guidance, this vein was accessed with a 21 gauge micropuncture needle and image documentation was performed.  The needle was exchanged over a guidewire for a peel-away sheath through which a 17 cm 5 [place] dual lumen power injectable PICC was advanced, and positioned with its tip at the lower SVC/right atrial junction. Fluoroscopy during the procedure and fluoro spot radiograph confirms appropriate catheter position.  The catheter was flushed, secured to the skin with Prolene sutures, and covered with a sterile dressing.  Complications:  None.  The patient tolerated the procedure well.  IMPRESSION:  Successful placement of  a right IJ approach dual lumen PowerPICC with sonographic and fluoroscopic guidance.  The catheter is ready for use.  Signed,  Sterling Big, MD Vascular & Interventional Radiologist [facility] Radiology   Original Report Authenticated By: Malachy Moan, M.D.   [facility] 1 View  04/21/2013   *RADIOLOGY REPORT*  Clinical Data: End-stage renal disease  PORTABLE CHEST - 1 VIEW  Comparison: 04/20/2013  Findings: Cardiac shadow is stable but mildly enlarged.  Patchy infiltrative changes are again seen in the bases bilaterally.  A right-sided jugular central line is now noted with catheter tip in the superior right atrium.  No pneumothorax is noted.  IMPRESSION: Stable infiltrative changes bilaterally.  Status post central line placement without complication.   Original Report Authenticated By: Alcide Clever, M.D.   Medications: Infusions:    Scheduled Medications: . amLODipine  10 mg Oral QHS  . aspirin  81 mg Oral Daily  . calcium acetate  2,001 mg Oral TID WC  . ceFEPime (MAXIPIME) IV  2 g Intravenous Q M,W,F-1800  . cinacalcet  60 mg Oral Q breakfast  .  darbepoetin (ARANESP) injection - DIALYSIS  40 mcg Intravenous Q Fri-HD  . doxercalciferol  2 mcg Intravenous Q M,W,F-HD  . ferric gluconate (FERRLECIT/NULECIT) IV  125 mg Intravenous Q M,W,F-HD  . gabapentin  200 mg Oral QHS  . heparin  5,000 Units Subcutaneous Q8H  . insulin aspart  0-15 Units Subcutaneous TID WC  . insulin aspart protamine- aspart  6 Units Subcutaneous Q supper  . insulin aspart protamine- aspart  8 Units Subcutaneous QAC breakfast  . metoCLOPramide  5 mg Oral TID AC  . multivitamin  1 tablet Oral Daily  . pantoprazole  40 mg Oral Daily  . sevelamer carbonate  2,400 mg Oral TID WC  . sodium chloride  10-40 mL Intracatheter Q12H  . vancomycin  1,000 mg Intravenous Q M,W,F-2000    have reviewed scheduled and prn medications.  Physical Exam: General: NAD, seen on HD- not hypoxic Heart: RRR Lungs: mostly clear Abdomen: obese, soft non tender Extremities: no obvious edema Dialysis Access: thigh AVG- accessed    04/21/2013,8:22 AM  LOS: 1 day

## 2013-04-21 NOTE — Procedures (Signed)
Patient was seen on dialysis and the procedure was supervised.  BFR 400  Via AVG BP is  151/71.   Patient appears to be tolerating treatment well  Naylah Cork A 04/21/2013

## 2013-04-26 LAB — CULTURE, BLOOD (ROUTINE X 2)

## 2013-05-19 ENCOUNTER — Ambulatory Visit
Admission: RE | Admit: 2013-05-19 | Discharge: 2013-05-19 | Disposition: A | Discharge status: Home / Self Care | Payer: Medicare Other | Source: Ambulatory Visit | Attending: Nephrology | Admitting: Nephrology

## 2013-05-19 ENCOUNTER — Other Ambulatory Visit: Payer: Self-pay | Admitting: Nephrology

## 2013-05-19 DIAGNOSIS — J189 Pneumonia, unspecified organism: Secondary | ICD-10-CM

## 2014-04-11 ENCOUNTER — Emergency Department (HOSPITAL_COMMUNITY)
Admission: EM | Admit: 2014-04-11 | Discharge: 2014-04-12 | Disposition: A | Discharge status: Home / Self Care | Payer: Medicare Other | Attending: Emergency Medicine | Admitting: Emergency Medicine

## 2014-04-11 DIAGNOSIS — I120 Hypertensive chronic kidney disease with stage 5 chronic kidney disease or end stage renal disease: Secondary | ICD-10-CM | POA: Diagnosis not present

## 2014-04-11 DIAGNOSIS — R112 Nausea with vomiting, unspecified: Secondary | ICD-10-CM | POA: Diagnosis present

## 2014-04-11 DIAGNOSIS — Z7982 Long term (current) use of aspirin: Secondary | ICD-10-CM | POA: Diagnosis not present

## 2014-04-11 DIAGNOSIS — Z8614 Personal history of Methicillin resistant Staphylococcus aureus infection: Secondary | ICD-10-CM | POA: Diagnosis not present

## 2014-04-11 DIAGNOSIS — E119 Type 2 diabetes mellitus without complications: Secondary | ICD-10-CM | POA: Insufficient documentation

## 2014-04-11 DIAGNOSIS — N186 End stage renal disease: Secondary | ICD-10-CM | POA: Insufficient documentation

## 2014-04-11 DIAGNOSIS — Z992 Dependence on renal dialysis: Secondary | ICD-10-CM | POA: Insufficient documentation

## 2014-04-11 DIAGNOSIS — Z79899 Other long term (current) drug therapy: Secondary | ICD-10-CM | POA: Diagnosis not present

## 2014-04-11 DIAGNOSIS — Z8701 Personal history of pneumonia (recurrent): Secondary | ICD-10-CM | POA: Diagnosis not present

## 2014-04-11 DIAGNOSIS — Z8669 Personal history of other diseases of the nervous system and sense organs: Secondary | ICD-10-CM | POA: Diagnosis not present

## 2014-04-11 DIAGNOSIS — K219 Gastro-esophageal reflux disease without esophagitis: Secondary | ICD-10-CM | POA: Diagnosis not present

## 2014-04-11 DIAGNOSIS — Z794 Long term (current) use of insulin: Secondary | ICD-10-CM | POA: Insufficient documentation

## 2014-04-11 NOTE — ED Notes (Signed)
Pt from home. Had dialysis today. Took nighttime med Sensipar and became nauseated and threw up. Called EMS. Pt states this happens every time she takes this medication. States she has told her PCP about the N/V and they have not taken her off of it. 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 Family reports pt "passed out" for a few minutes, but pt denies passing out.

## 2014-04-12 ENCOUNTER — Encounter (HOSPITAL_COMMUNITY): Payer: Self-pay | Admitting: Emergency Medicine

## 2014-04-12 NOTE — ED Notes (Signed)
Pt verbalized understanding to follow up.

## 2014-04-12 NOTE — ED Provider Notes (Signed)
CSN: [id number]     Arrival date & time 04/11/14  2355 History   First MD Initiated Contact with Patient 04/12/14 0023     Chief Complaint  Patient presents with  . Nausea  . Emesis     (Consider location/radiation/quality/duration/timing/severity/associated sxs/prior Treatment) Patient is a 43 y.o. female presenting with vomiting. The history is provided by the patient.  Emesis She is on dialysis and has been put on cinacalcet for secondary hyperparathyroidism. She states that every time she takes the medication, she develops nausea and vomiting. She had dialysis earlier today and took the medication and developed nausea and vomiting. She states she's actually feeling better now. She denies abdominal pain or chest pain. She denies fever or chills.  Past Medical History  Diagnosis Date  . GERD (gastroesophageal reflux disease)   . Diabetes mellitus     IDDM  . Hypertension     states has been on med. x "a long time"  . ESRF (end stage renal failure)     dialysis M,W, F; left thigh AV graft  . History of gangrene     left foot  . Hx MRSA infection   . 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 Pneumonia 04/20/2013   Past Surgical History  Procedure Laterality Date  . Av fistula placement  10/18/2008    right thigh AV graft  . Finger debridement  06/20/2010    right middle finger  . Finger amputation  05/27/2010    right middle  . Thrombectomy / arteriovenous graft revision  01/14/2010    left superficial femoral artery; left thigh AV graft placement  . 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 Leg amputation below knee  10/11/2009    right  . Central venous catheter insertion  08/06/2009    removal right IJ Diatek cath., placement left IJ Diatek cath.  . Above knee leg amputation  05/21/2009    left  . Foot amputation  04/04/2009    left transtibial amputation  . Excision / curettage bone cyst talus / calcaneus  03/04/2009    partial calcaneal exc. left; placement  wound VAC  . Groin debridement  11/16/2008    right rectus femoris muscle flap to right groin wound; right groing debridement  . Femoral endarterectomy  11/06/2008    right common femoral; embolectomy right femoral artery; removal right femoral AV graft  . Central venous catheter insertion  07/10/2008; 12/31/2006; 01/19/2006; 06/19/2005; 09/19/2004; 09/08/2004    right IJ Diatek catheter  . Av fistula repair  07/08/2008    removal left upper arm AV graft  . Thrombectomy / arteriovenous graft revision  05/11/2007;12/31/2006; 01/18/2006; 12/16/2005    left upper arm  . Dialysis fistula creation  01/20/2007    left upper arm AV graft  . Dialysis fistula creation  01/28/2006    right upper arm AV graft  . Thrombectomy / arteriovenous graft revision  10/26/2005    left forearm AV graft  . Dialysis fistula creation  10/04/2005    left forearm AV graft  . Av fistula repair  07/14/2005    removal infected right forearm AV graft  . Thrombectomy / arteriovenous graft revision  03/11/2005; 10/20/2004    right forearm AV graft  . Dialysis fistula creation  09/10/2004    right forearm AV graft  . Av fistula placement  07/18/2004    creation left AV fistula, radial to cephalic  . Finger exploration  07/13/2002  and repair left middle finger  . Carpal tunnel release  01/26/2012    Procedure: CARPAL TUNNEL RELEASE;  Surgeon: Nicki Reaper, MD;  Location: [facility];  Service: Orthopedics;  Laterality: Left;  . Esophagogastroduodenoscopy  06/26/2012    Procedure: ESOPHAGOGASTRODUODENOSCOPY (EGD);  Surgeon: Charna Elizabeth, MD;  Location: [facility] ENDOSCOPY;  Service: Endoscopy;  Laterality: N/A;   Family History  Problem Relation Age of Onset  . Diabetes Mother   . Heart disease Mother   . Hypertension Mother   . Heart attack Mother 8  . Diabetes Sister   . Hypertension Brother   . Heart attack Brother    History  Substance Use Topics  . Smoking status: Never Smoker   . Smokeless tobacco: Never Used  .  Alcohol Use: No   OB History   Grav Para Term Preterm Abortions TAB SAB Ect Mult Living                 Review of Systems  Gastrointestinal: Positive for vomiting.  All other systems reviewed and are negative.     Allergies  Tramadol; Percocet; and Soap  Home Medications   Prior to Admission medications   Medication Sig Start Date End Date Taking? Authorizing Provider  amLODipine (NORVASC) 10 MG tablet Take 10 mg by mouth at bedtime.   Yes Historical Provider, MD  aspirin 81 MG chewable tablet Chew 81 mg by mouth daily.   Yes Historical Provider, MD  calcium acetate (PHOSLO) 667 MG capsule Take 2,001 mg by mouth 3 (three) times daily with meals.   Yes Historical Provider, MD  cinacalcet (SENSIPAR) 60 MG tablet Take 60 mg by mouth daily.   Yes Historical Provider, MD  gabapentin (NEURONTIN) 100 MG capsule Take 200 mg by mouth at bedtime.    Yes Historical Provider, MD  insulin aspart protamine-insulin aspart (NOVOLOG 70/30) (70-30) 100 UNIT/ML injection Inject 6-8 Units into the skin 2 (two) times daily with a meal. Take 8 units in the morning and 6 units in the evening   Yes Historical Provider, MD  metoCLOPramide (REGLAN) 5 MG tablet Take 5 mg by mouth 3 (three) times daily as needed. For nausea   Yes Historical Provider, MD  omeprazole (PRILOSEC) 20 MG capsule Take 20 mg by mouth 2 (two) times daily.   Yes Historical Provider, MD  sevelamer (RENVELA) 800 MG tablet Take 2,400 mg by mouth 3 (three) times daily with meals.    Yes Historical Provider, MD   BP 196/86  Pulse 83  Temp(Src) 98.6 F (37 C) (Oral)  Resp 13  SpO2 100% Physical Exam  Nursing note and vitals reviewed.  43 year old female, resting comfortably and in no acute distress. Vital signs are significant for hypertension. Oxygen saturation is 100%, which is normal. Head is normocephalic and atraumatic. PERRLA, EOMI. Oropharynx is clear. Neck is nontender and supple without adenopathy or JVD. Back is nontender  and there is no CVA tenderness. Lungs are clear without rales, wheezes, or rhonchi. Chest is nontender. Heart has regular rate and rhythm without murmur. Abdomen is soft, flat, nontender without masses or hepatosplenomegaly and peristalsis is normoactive. Extremities: Bilateral above-the-knee amputations. Dialysis access site in left thigh.. Skin is warm and dry without rash. Neurologic: Mental status is normal, cranial nerves are intact, there are no motor or sensory deficits.  ED Course  Procedures (including critical care time) Labs Review Labs Reviewed - No data to display  Imaging Review No results found.   EKG  Interpretation   Date/Time:  Wednesday April 11 2014 23:59:02 EDT Ventricular Rate:  93 PR Interval:  185 QRS Duration: 93 QT Interval:  390 QTC Calculation: 485 R Axis:   52 Text Interpretation:  Sinus rhythm Probable LVH with secondary repol abnrm  Borderline prolonged QT interval When compared with ECG of 06/22/2012, QT  has shortened Confirmed by [facility]  MD, Amritpal Shropshire ([id number]) on 04/12/2014 12:07:51  AM      MDM   Final diagnoses:  Nausea and vomiting, vomiting of unspecified type  End stage renal disease on dialysis    Nausea and vomiting as a side effect of cinacalcet. She had been taking the metoclopramide in the past for nausea and vomiting but is not taking it currently. She is advised to try taking metoclopramide one hour before taking the cicacalcet.  Dione Booze, MD 04/12/14 [phone]

## 2014-04-12 NOTE — Discharge Instructions (Signed)
Try taking your Metoclopramide about one hour before taking your Sensipar.

## 2014-08-09 ENCOUNTER — Encounter (HOSPITAL_COMMUNITY): Payer: Self-pay | Admitting: Vascular Surgery

## 2015-01-30 DIAGNOSIS — I2510 Atherosclerotic heart disease of native coronary artery without angina pectoris: Secondary | ICD-10-CM

## 2015-01-30 HISTORY — DX: Atherosclerotic heart disease of native coronary artery without angina pectoris: I25.10

## 2015-02-01 ENCOUNTER — Inpatient Hospital Stay (HOSPITAL_COMMUNITY): Payer: Medicare Other

## 2015-02-01 ENCOUNTER — Encounter (HOSPITAL_COMMUNITY): Payer: Self-pay | Admitting: Emergency Medicine

## 2015-02-01 ENCOUNTER — Other Ambulatory Visit: Payer: Self-pay

## 2015-02-01 ENCOUNTER — Encounter (HOSPITAL_COMMUNITY)
Admission: EM | Disposition: A | Discharge status: Home / Self Care | Payer: Medicare Other | Source: Home / Self Care | Attending: Internal Medicine

## 2015-02-01 ENCOUNTER — Other Ambulatory Visit (HOSPITAL_COMMUNITY): Payer: Self-pay

## 2015-02-01 ENCOUNTER — Inpatient Hospital Stay (HOSPITAL_COMMUNITY)
Admission: EM | Admit: 2015-02-01 | Discharge: 2015-02-03 | DRG: 246 | Disposition: A | Discharge status: Home / Self Care | Payer: Medicare Other | Attending: Internal Medicine | Admitting: Internal Medicine

## 2015-02-01 DIAGNOSIS — N186 End stage renal disease: Secondary | ICD-10-CM | POA: Diagnosis present

## 2015-02-01 DIAGNOSIS — I739 Peripheral vascular disease, unspecified: Secondary | ICD-10-CM | POA: Diagnosis present

## 2015-02-01 DIAGNOSIS — Z833 Family history of diabetes mellitus: Secondary | ICD-10-CM

## 2015-02-01 DIAGNOSIS — I10 Essential (primary) hypertension: Secondary | ICD-10-CM | POA: Diagnosis not present

## 2015-02-01 DIAGNOSIS — R0902 Hypoxemia: Secondary | ICD-10-CM | POA: Diagnosis present

## 2015-02-01 DIAGNOSIS — R042 Hemoptysis: Secondary | ICD-10-CM | POA: Diagnosis present

## 2015-02-01 DIAGNOSIS — E118 Type 2 diabetes mellitus with unspecified complications: Secondary | ICD-10-CM

## 2015-02-01 DIAGNOSIS — Z7982 Long term (current) use of aspirin: Secondary | ICD-10-CM | POA: Diagnosis not present

## 2015-02-01 DIAGNOSIS — I120 Hypertensive chronic kidney disease with stage 5 chronic kidney disease or end stage renal disease: Secondary | ICD-10-CM | POA: Diagnosis present

## 2015-02-01 DIAGNOSIS — Z91048 Other nonmedicinal substance allergy status: Secondary | ICD-10-CM

## 2015-02-01 DIAGNOSIS — D649 Anemia, unspecified: Secondary | ICD-10-CM | POA: Diagnosis present

## 2015-02-01 DIAGNOSIS — Z886 Allergy status to analgesic agent status: Secondary | ICD-10-CM | POA: Diagnosis not present

## 2015-02-01 DIAGNOSIS — R112 Nausea with vomiting, unspecified: Secondary | ICD-10-CM | POA: Diagnosis not present

## 2015-02-01 DIAGNOSIS — Z89612 Acquired absence of left leg above knee: Secondary | ICD-10-CM

## 2015-02-01 DIAGNOSIS — R7989 Other specified abnormal findings of blood chemistry: Secondary | ICD-10-CM | POA: Diagnosis not present

## 2015-02-01 DIAGNOSIS — Z794 Long term (current) use of insulin: Secondary | ICD-10-CM | POA: Diagnosis not present

## 2015-02-01 DIAGNOSIS — E1129 Type 2 diabetes mellitus with other diabetic kidney complication: Secondary | ICD-10-CM | POA: Diagnosis present

## 2015-02-01 DIAGNOSIS — Z89611 Acquired absence of right leg above knee: Secondary | ICD-10-CM

## 2015-02-01 DIAGNOSIS — R778 Other specified abnormalities of plasma proteins: Secondary | ICD-10-CM | POA: Diagnosis present

## 2015-02-01 DIAGNOSIS — N2581 Secondary hyperparathyroidism of renal origin: Secondary | ICD-10-CM | POA: Diagnosis present

## 2015-02-01 DIAGNOSIS — K219 Gastro-esophageal reflux disease without esophagitis: Secondary | ICD-10-CM | POA: Diagnosis present

## 2015-02-01 DIAGNOSIS — I272 Other secondary pulmonary hypertension: Secondary | ICD-10-CM | POA: Diagnosis present

## 2015-02-01 DIAGNOSIS — J811 Chronic pulmonary edema: Secondary | ICD-10-CM

## 2015-02-01 DIAGNOSIS — Z885 Allergy status to narcotic agent status: Secondary | ICD-10-CM | POA: Diagnosis not present

## 2015-02-01 DIAGNOSIS — Z992 Dependence on renal dialysis: Secondary | ICD-10-CM | POA: Diagnosis not present

## 2015-02-01 DIAGNOSIS — Z09 Encounter for follow-up examination after completed treatment for conditions other than malignant neoplasm: Secondary | ICD-10-CM

## 2015-02-01 DIAGNOSIS — Z8249 Family history of ischemic heart disease and other diseases of the circulatory system: Secondary | ICD-10-CM

## 2015-02-01 DIAGNOSIS — J810 Acute pulmonary edema: Secondary | ICD-10-CM | POA: Insufficient documentation

## 2015-02-01 DIAGNOSIS — I214 Non-ST elevation (NSTEMI) myocardial infarction: Principal | ICD-10-CM | POA: Diagnosis present

## 2015-02-01 DIAGNOSIS — E1059 Type 1 diabetes mellitus with other circulatory complications: Secondary | ICD-10-CM | POA: Diagnosis not present

## 2015-02-01 DIAGNOSIS — Z79899 Other long term (current) drug therapy: Secondary | ICD-10-CM

## 2015-02-01 DIAGNOSIS — IMO0001 Reserved for inherently not codable concepts without codable children: Secondary | ICD-10-CM

## 2015-02-01 DIAGNOSIS — I2510 Atherosclerotic heart disease of native coronary artery without angina pectoris: Secondary | ICD-10-CM

## 2015-02-01 HISTORY — PX: CARDIAC CATHETERIZATION: SHX172

## 2015-02-01 LAB — TSH: TSH: 2.358 u[IU]/mL (ref 0.350–4.500)

## 2015-02-01 LAB — COMPREHENSIVE METABOLIC PANEL
ALBUMIN: 3.5 g/dL (ref 3.5–5.0)
ALT: 9 U/L — ABNORMAL LOW (ref 14–54)
ANION GAP: 14 (ref 5–15)
AST: 18 U/L (ref 15–41)
Alkaline Phosphatase: 65 U/L (ref 38–126)
BILIRUBIN TOTAL: 0.5 mg/dL (ref 0.3–1.2)
BUN: 14 mg/dL (ref 6–20)
CO2: 29 mmol/L (ref 22–32)
CREATININE: 5.83 mg/dL — AB (ref 0.44–1.00)
Calcium: 9.8 mg/dL (ref 8.9–10.3)
Chloride: 93 mmol/L — ABNORMAL LOW (ref 101–111)
GFR, EST AFRICAN AMERICAN: 9 mL/min — AB (ref >60)
GFR, EST NON AFRICAN AMERICAN: 8 mL/min — AB (ref >60)
GLUCOSE: 330 mg/dL — AB (ref 65–99)
Potassium: 3.5 mmol/L (ref 3.5–5.1)
SODIUM: 136 mmol/L (ref 135–145)
TOTAL PROTEIN: 8.2 g/dL — AB (ref 6.5–8.1)

## 2015-02-01 LAB — GLUCOSE, CAPILLARY
GLUCOSE-CAPILLARY: 145 mg/dL — AB (ref 65–99)
Glucose-Capillary: 168 mg/dL — ABNORMAL HIGH (ref 65–99)

## 2015-02-01 LAB — CBC WITH DIFFERENTIAL/PLATELET
BASOS ABS: 0 10*3/uL (ref 0.0–0.1)
Basophils Relative: 0 % (ref 0–1)
Eosinophils Absolute: 0.1 10*3/uL (ref 0.0–0.7)
Eosinophils Relative: 0 % (ref 0–5)
HEMATOCRIT: 34.7 % — AB (ref 36.0–46.0)
Hemoglobin: 11.5 g/dL — ABNORMAL LOW (ref 12.0–15.0)
LYMPHS PCT: 7 % — AB (ref 12–46)
Lymphs Abs: 1 10*3/uL (ref 0.7–4.0)
MCH: 28.6 pg (ref 26.0–34.0)
MCHC: 33.1 g/dL (ref 30.0–36.0)
MCV: 86.3 fL (ref 78.0–100.0)
MONO ABS: 0.8 10*3/uL (ref 0.1–1.0)
Monocytes Relative: 5 % (ref 3–12)
Neutro Abs: 13.6 10*3/uL — ABNORMAL HIGH (ref 1.7–7.7)
Neutrophils Relative %: 88 % — ABNORMAL HIGH (ref 43–77)
Platelets: 282 10*3/uL (ref 150–400)
RBC: 4.02 MIL/uL (ref 3.87–5.11)
RDW: 15.9 % — ABNORMAL HIGH (ref 11.5–15.5)
WBC: 15.5 10*3/uL — ABNORMAL HIGH (ref 4.0–10.5)

## 2015-02-01 LAB — LIPASE, BLOOD: Lipase: 19 U/L — ABNORMAL LOW (ref 22–51)

## 2015-02-01 LAB — CBG MONITORING, ED
GLUCOSE-CAPILLARY: 138 mg/dL — AB (ref 65–99)
GLUCOSE-CAPILLARY: 152 mg/dL — AB (ref 65–99)
Glucose-Capillary: 56 mg/dL — ABNORMAL LOW (ref 65–99)

## 2015-02-01 LAB — I-STAT TROPONIN, ED: TROPONIN I, POC: 2.17 ng/mL — AB (ref 0.00–0.08)

## 2015-02-01 LAB — POCT ACTIVATED CLOTTING TIME
ACTIVATED CLOTTING TIME: 343 s
Activated Clotting Time: 159 seconds

## 2015-02-01 LAB — TROPONIN I
TROPONIN I: 2.22 ng/mL — AB (ref <0.031)
Troponin I: 2.03 ng/mL (ref <0.031)
Troponin I: 2.2 ng/mL (ref <0.031)
Troponin I: 3.44 ng/mL (ref <0.031)

## 2015-02-01 LAB — PROTIME-INR
INR: 1.11 (ref 0.00–1.49)
Prothrombin Time: 14.5 seconds (ref 11.6–15.2)

## 2015-02-01 LAB — HEPARIN LEVEL (UNFRACTIONATED): HEPARIN UNFRACTIONATED: 0.16 [IU]/mL — AB (ref 0.30–0.70)

## 2015-02-01 LAB — MRSA PCR SCREENING: MRSA by PCR: NEGATIVE

## 2015-02-01 SURGERY — LEFT HEART CATH AND CORONARY ANGIOGRAPHY
Anesthesia: LOCAL

## 2015-02-01 MED ORDER — ONDANSETRON HCL 4 MG/2ML IJ SOLN: 4.0000 mg | Freq: Four times a day (QID) | INTRAMUSCULAR | Status: DC | PRN

## 2015-02-01 MED ORDER — BIVALIRUDIN BOLUS VIA INFUSION - CUPID
INTRAVENOUS | Status: DC | PRN
  Administered 2015-02-01: 55.65 mg via INTRAVENOUS

## 2015-02-01 MED ORDER — ONDANSETRON HCL 4 MG/2ML IJ SOLN
4.0000 mg | Freq: Four times a day (QID) | INTRAMUSCULAR | Status: DC | PRN
  Administered 2015-02-01 – 2015-02-03 (×4): 4 mg via INTRAVENOUS
  Filled 2015-02-01 (×4): qty 2

## 2015-02-01 MED ORDER — SODIUM CHLORIDE 0.9 % IJ SOLN: 3.0000 mL | INTRAMUSCULAR | Status: DC | PRN

## 2015-02-01 MED ORDER — NITROGLYCERIN 1 MG/10 ML FOR IR/CATH LAB
INTRA_ARTERIAL | Status: DC | PRN
  Administered 2015-02-01 (×3): 200 ug via INTRACORONARY

## 2015-02-01 MED ORDER — HEPARIN (PORCINE) IN NACL 100-0.45 UNIT/ML-% IJ SOLN
1000.0000 [IU]/h | INTRAMUSCULAR | Status: DC
  Administered 2015-02-01: 800 [IU]/h via INTRAVENOUS
  Filled 2015-02-01 (×2): qty 250

## 2015-02-01 MED ORDER — HEPARIN SODIUM (PORCINE) 1000 UNIT/ML DIALYSIS: 1000.0000 [IU] | INTRAMUSCULAR | Status: DC | PRN

## 2015-02-01 MED ORDER — FENTANYL CITRATE (PF) 100 MCG/2ML IJ SOLN
INTRAMUSCULAR | Status: DC | PRN
  Administered 2015-02-01: 25 ug via INTRAVENOUS

## 2015-02-01 MED ORDER — ALTEPLASE 2 MG IJ SOLR
2.0000 mg | Freq: Once | INTRAMUSCULAR | Status: AC | PRN
  Filled 2015-02-01: qty 2

## 2015-02-01 MED ORDER — TICAGRELOR 90 MG PO TABS
90.0000 mg | ORAL_TABLET | Freq: Two times a day (BID) | ORAL | Status: DC
  Administered 2015-02-01 – 2015-02-03 (×4): 90 mg via ORAL
  Filled 2015-02-01 (×5): qty 1

## 2015-02-01 MED ORDER — ONDANSETRON HCL 4 MG/2ML IJ SOLN
INTRAMUSCULAR | Status: AC
  Filled 2015-02-01: qty 2

## 2015-02-01 MED ORDER — INSULIN ASPART PROT & ASPART (70-30 MIX) 100 UNIT/ML ~~LOC~~ SUSP
6.0000 [IU] | Freq: Every day | SUBCUTANEOUS | Status: DC
  Filled 2015-02-01: qty 10

## 2015-02-01 MED ORDER — ONDANSETRON HCL 4 MG/2ML IJ SOLN
INTRAMUSCULAR | Status: DC | PRN
  Administered 2015-02-01: 4 mg via INTRAVENOUS

## 2015-02-01 MED ORDER — LIDOCAINE HCL (PF) 1 % IJ SOLN
INTRAMUSCULAR | Status: AC
  Filled 2015-02-01: qty 30

## 2015-02-01 MED ORDER — LIDOCAINE-PRILOCAINE 2.5-2.5 % EX CREA
1.0000 | TOPICAL_CREAM | CUTANEOUS | Status: DC | PRN
Start: 2015-02-01 — End: 2015-02-02
  Filled 2015-02-01: qty 5

## 2015-02-01 MED ORDER — NEPRO/CARBSTEADY PO LIQD
237.0000 mL | ORAL | Status: DC | PRN
  Filled 2015-02-01: qty 237

## 2015-02-01 MED ORDER — SODIUM CHLORIDE 0.9 % IJ SOLN
3.0000 mL | Freq: Two times a day (BID) | INTRAMUSCULAR | Status: DC
  Administered 2015-02-01 – 2015-02-03 (×2): 3 mL via INTRAVENOUS

## 2015-02-01 MED ORDER — ATORVASTATIN CALCIUM 10 MG PO TABS
10.0000 mg | ORAL_TABLET | Freq: Every day | ORAL | Status: DC
  Filled 2015-02-01 (×2): qty 1

## 2015-02-01 MED ORDER — HEPARIN (PORCINE) IN NACL 2-0.9 UNIT/ML-% IJ SOLN
INTRAMUSCULAR | Status: AC
  Filled 2015-02-01: qty 1500

## 2015-02-01 MED ORDER — IOHEXOL 350 MG/ML SOLN
INTRAVENOUS | Status: DC | PRN
  Administered 2015-02-01: 155 mL via INTRA_ARTERIAL

## 2015-02-01 MED ORDER — NITROGLYCERIN IN D5W 200-5 MCG/ML-% IV SOLN
0.0000 ug/min | INTRAVENOUS | Status: DC
Start: 2015-02-01 — End: 2015-02-03
  Administered 2015-02-01: 30 ug/min via INTRAVENOUS

## 2015-02-01 MED ORDER — HEPARIN BOLUS VIA INFUSION
2000.0000 [IU] | Freq: Once | INTRAVENOUS | Status: AC
  Administered 2015-02-01: 2000 [IU] via INTRAVENOUS
  Filled 2015-02-01: qty 2000

## 2015-02-01 MED ORDER — NITROGLYCERIN 1 MG/10 ML FOR IR/CATH LAB
INTRA_ARTERIAL | Status: AC
  Filled 2015-02-01: qty 10

## 2015-02-01 MED ORDER — DEXTROSE 50 % IV SOLN: 25.0000 mL | INTRAVENOUS | Status: DC | PRN

## 2015-02-01 MED ORDER — ASPIRIN 81 MG PO CHEW: 81.0000 mg | CHEWABLE_TABLET | Freq: Every day | ORAL | Status: DC

## 2015-02-01 MED ORDER — ACETAMINOPHEN 325 MG PO TABS
650.0000 mg | ORAL_TABLET | ORAL | Status: DC | PRN
  Administered 2015-02-01 – 2015-02-03 (×2): 650 mg via ORAL
  Filled 2015-02-01 (×2): qty 2

## 2015-02-01 MED ORDER — SODIUM CHLORIDE 0.9 % IV SOLN
INTRAVENOUS | Status: DC
  Administered 2015-02-01: 06:00:00 via INTRAVENOUS

## 2015-02-01 MED ORDER — MIDAZOLAM HCL 2 MG/2ML IJ SOLN
INTRAMUSCULAR | Status: AC
  Filled 2015-02-01: qty 2

## 2015-02-01 MED ORDER — MIDAZOLAM HCL 2 MG/2ML IJ SOLN
INTRAMUSCULAR | Status: DC | PRN
  Administered 2015-02-01: 1 mg via INTRAVENOUS

## 2015-02-01 MED ORDER — BIVALIRUDIN 250 MG IV SOLR
INTRAVENOUS | Status: AC
Start: 2015-02-01 — End: 2015-02-01
  Filled 2015-02-01: qty 250

## 2015-02-01 MED ORDER — NITROGLYCERIN IN D5W 200-5 MCG/ML-% IV SOLN
INTRAVENOUS | Status: AC
  Filled 2015-02-01: qty 250

## 2015-02-01 MED ORDER — ASPIRIN EC 81 MG PO TBEC
81.0000 mg | DELAYED_RELEASE_TABLET | Freq: Every day | ORAL | Status: DC
  Administered 2015-02-02 – 2015-02-03 (×2): 81 mg via ORAL
  Filled 2015-02-01 (×2): qty 1

## 2015-02-01 MED ORDER — PERFLUTREN LIPID MICROSPHERE
1.0000 mL | INTRAVENOUS | Status: AC | PRN
  Administered 2015-02-01: 2 mL via INTRAVENOUS
  Filled 2015-02-01: qty 10

## 2015-02-01 MED ORDER — DEXTROSE-NACL 5-0.45 % IV SOLN: INTRAVENOUS | Status: DC

## 2015-02-01 MED ORDER — HEPARIN SODIUM (PORCINE) 5000 UNIT/ML IJ SOLN
5000.0000 [IU] | Freq: Three times a day (TID) | INTRAMUSCULAR | Status: DC
Start: 2015-02-02 — End: 2015-02-03
  Administered 2015-02-02 – 2015-02-03 (×4): 5000 [IU] via SUBCUTANEOUS
  Filled 2015-02-01 (×7): qty 1

## 2015-02-01 MED ORDER — INSULIN REGULAR BOLUS VIA INFUSION
0.0000 [IU] | Freq: Three times a day (TID) | INTRAVENOUS | Status: DC
Start: 2015-02-01 — End: 2015-02-03
  Filled 2015-02-01: qty 10

## 2015-02-01 MED ORDER — SODIUM CHLORIDE 0.9 % IV SOLN
250.0000 mg | INTRAVENOUS | Status: DC | PRN
  Administered 2015-02-01: 0.25 mg/kg/h via INTRAVENOUS

## 2015-02-01 MED ORDER — ASPIRIN 81 MG PO CHEW
324.0000 mg | CHEWABLE_TABLET | Freq: Once | ORAL | Status: AC
  Administered 2015-02-01: 324 mg via ORAL
  Filled 2015-02-01: qty 4

## 2015-02-01 MED ORDER — AMLODIPINE BESYLATE 10 MG PO TABS
10.0000 mg | ORAL_TABLET | Freq: Every day | ORAL | Status: DC
  Administered 2015-02-01 – 2015-02-02 (×2): 10 mg via ORAL
  Filled 2015-02-01 (×3): qty 1

## 2015-02-01 MED ORDER — FENTANYL CITRATE (PF) 100 MCG/2ML IJ SOLN
INTRAMUSCULAR | Status: AC
  Filled 2015-02-01: qty 2

## 2015-02-01 MED ORDER — CINACALCET HCL 30 MG PO TABS
90.0000 mg | ORAL_TABLET | Freq: Every day | ORAL | Status: DC
  Administered 2015-02-02 – 2015-02-03 (×2): 90 mg via ORAL
  Filled 2015-02-01 (×4): qty 3

## 2015-02-01 MED ORDER — SODIUM CHLORIDE 0.9 % IV SOLN: 100.0000 mL | INTRAVENOUS | Status: DC | PRN

## 2015-02-01 MED ORDER — DIPHENHYDRAMINE HCL 50 MG/ML IJ SOLN
12.5000 mg | Freq: Once | INTRAMUSCULAR | Status: AC
Start: 2015-02-01 — End: 2015-02-01
  Administered 2015-02-01: 12.5 mg via INTRAVENOUS

## 2015-02-01 MED ORDER — METOPROLOL SUCCINATE 12.5 MG HALF TABLET
12.5000 mg | ORAL_TABLET | Freq: Every day | ORAL | Status: DC
  Administered 2015-02-01 – 2015-02-02 (×2): 12.5 mg via ORAL
  Filled 2015-02-01 (×3): qty 1

## 2015-02-01 MED ORDER — SODIUM CHLORIDE 0.9 % IV SOLN
INTRAVENOUS | Status: DC
  Filled 2015-02-01: qty 2.5

## 2015-02-01 MED ORDER — SODIUM CHLORIDE 0.9 % IV SOLN: 250.0000 mL | INTRAVENOUS | Status: DC | PRN

## 2015-02-01 MED ORDER — TICAGRELOR 90 MG PO TABS
ORAL_TABLET | ORAL | Status: AC
  Filled 2015-02-01: qty 1

## 2015-02-01 MED ORDER — SODIUM CHLORIDE 0.9 % IJ SOLN
3.0000 mL | Freq: Two times a day (BID) | INTRAMUSCULAR | Status: DC
  Administered 2015-02-01: 3 mL via INTRAVENOUS

## 2015-02-01 MED ORDER — HEPARIN SODIUM (PORCINE) 1000 UNIT/ML DIALYSIS: 40.0000 [IU]/kg | INTRAMUSCULAR | Status: DC | PRN

## 2015-02-01 MED ORDER — DIPHENHYDRAMINE HCL 50 MG/ML IJ SOLN
INTRAMUSCULAR | Status: AC
  Administered 2015-02-01: 12.5 mg via INTRAVENOUS
  Filled 2015-02-01: qty 1

## 2015-02-01 MED ORDER — PANTOPRAZOLE SODIUM 40 MG PO TBEC
40.0000 mg | DELAYED_RELEASE_TABLET | Freq: Every day | ORAL | Status: DC
  Administered 2015-02-02 – 2015-02-03 (×2): 40 mg via ORAL
  Filled 2015-02-01 (×3): qty 1

## 2015-02-01 MED ORDER — TICAGRELOR 90 MG PO TABS
ORAL_TABLET | ORAL | Status: DC | PRN
  Administered 2015-02-01: 180 mg via ORAL

## 2015-02-01 MED ORDER — NITROGLYCERIN IN D5W 200-5 MCG/ML-% IV SOLN
INTRAVENOUS | Status: DC | PRN
  Administered 2015-02-01: 5 ug/min via INTRAVENOUS

## 2015-02-01 MED ORDER — ASPIRIN 81 MG PO CHEW: 81.0000 mg | CHEWABLE_TABLET | ORAL | Status: DC

## 2015-02-01 MED ORDER — ASPIRIN EC 81 MG PO TBEC: 81.0000 mg | DELAYED_RELEASE_TABLET | Freq: Every day | ORAL | Status: DC

## 2015-02-01 MED ORDER — SODIUM CHLORIDE 0.9 % IV SOLN: INTRAVENOUS | Status: DC

## 2015-02-01 MED ORDER — INSULIN ASPART PROT & ASPART (70-30 MIX) 100 UNIT/ML ~~LOC~~ SUSP
10.0000 [IU] | Freq: Every day | SUBCUTANEOUS | Status: DC
  Administered 2015-02-02 – 2015-02-03 (×2): 10 [IU] via SUBCUTANEOUS
  Filled 2015-02-01: qty 10

## 2015-02-01 MED ORDER — INSULIN ASPART PROT & ASPART (70-30 MIX) 100 UNIT/ML ~~LOC~~ SUSP: 6.0000 [IU] | Freq: Two times a day (BID) | SUBCUTANEOUS | Status: DC

## 2015-02-01 MED ORDER — NITROGLYCERIN 0.4 MG SL SUBL: 0.4000 mg | SUBLINGUAL_TABLET | SUBLINGUAL | Status: DC | PRN

## 2015-02-01 MED ORDER — SODIUM CHLORIDE 0.9 % IJ SOLN
3.0000 mL | Freq: Two times a day (BID) | INTRAMUSCULAR | Status: DC
  Administered 2015-02-02 – 2015-02-03 (×3): 3 mL via INTRAVENOUS

## 2015-02-01 MED ORDER — ACETAMINOPHEN 325 MG PO TABS
650.0000 mg | ORAL_TABLET | ORAL | Status: DC | PRN
  Administered 2015-02-02: 650 mg via ORAL
  Filled 2015-02-01: qty 2

## 2015-02-01 MED ORDER — PENTAFLUOROPROP-TETRAFLUOROETH EX AERO: 1.0000 "application " | INHALATION_SPRAY | CUTANEOUS | Status: DC | PRN

## 2015-02-01 MED ORDER — LIDOCAINE HCL (PF) 1 % IJ SOLN: 5.0000 mL | INTRAMUSCULAR | Status: DC | PRN

## 2015-02-01 MED ORDER — SEVELAMER CARBONATE 800 MG PO TABS
3200.0000 mg | ORAL_TABLET | Freq: Two times a day (BID) | ORAL | Status: DC
  Administered 2015-02-02: 3200 mg via ORAL
  Filled 2015-02-01 (×6): qty 4

## 2015-02-01 SURGICAL SUPPLY — 16 items
BALLN TREK RX 2.5X12 (BALLOONS) ×3
BALLN ~~LOC~~ EUPHORA RX 2.75X12 (BALLOONS) ×3
BALLOON TREK RX 2.5X12 (BALLOONS) IMPLANT
BALLOON ~~LOC~~ EUPHORA RX 2.75X12 (BALLOONS) IMPLANT
CATH INFINITI 5FR MULTPACK ANG (CATHETERS) ×3 IMPLANT
CATH VISTA GUIDE 6FR XB3.5 (CATHETERS) ×2 IMPLANT
KIT ENCORE 26 ADVANTAGE (KITS) ×2 IMPLANT
KIT HEART LEFT (KITS) ×3 IMPLANT
PACK CARDIAC CATHETERIZATION (CUSTOM PROCEDURE TRAY) ×3 IMPLANT
SHEATH PINNACLE 5F 10CM (SHEATH) ×3 IMPLANT
SHEATH PINNACLE 6F 10CM (SHEATH) ×3 IMPLANT
STENT XIENCE ALPINE RX 2.5X15 (Permanent Stent) ×2 IMPLANT
TRANSDUCER W/STOPCOCK (MISCELLANEOUS) ×3 IMPLANT
TUBING CIL FLEX 10 FLL-RA (TUBING) ×3 IMPLANT
WIRE ASAHI PROWATER 180CM (WIRE) ×2 IMPLANT
WIRE EMERALD 3MM-J .035X150CM (WIRE) ×3 IMPLANT

## 2015-02-01 NOTE — Progress Notes (Signed)
ANTICOAGULATION CONSULT NOTE - Initial Consult  Pharmacy Consult for heparin Indication: chest pain/ACS  Allergies  Allergen Reactions  . Tramadol Itching  . Percocet [Oxycodone-Acetaminophen] Other (See Comments)    GI UPSET  . Soap Itching    IVORY SOAP    Patient Measurements: Height: 4' 11.84" (152 cm) Weight: 170 lb 13.7 oz (77.5 kg) IBW/kg (Calculated) : 45.14 Heparin Dosing Weight: 60kg  Vital Signs: Temp: 98.2 F (36.8 C) (06/03 0107) Temp Source: Oral (06/03 0107) BP: 143/80 mmHg (06/03 0300)  Labs:  Recent Labs  02/01/15 0148  HGB 11.5*  HCT 34.7*  PLT 282  CREATININE 5.83*  TROPONINI 2.03*    Estimated Creatinine Clearance: 11.3 mL/min (by C-G formula based on Cr of 5.83).   Medical History: Past Medical History  Diagnosis Date  . GERD (gastroesophageal reflux disease)   . Diabetes mellitus     IDDM  . Hypertension     states has been on med. x "a long time"  . ESRF (end stage renal failure)     dialysis M,W, F; left thigh AV graft  . History of gangrene     left foot  . Hx MRSA infection   . 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 Pneumonia 04/20/2013     Assessment: 43yo female c/o emesis associated w/ central abdominal pain, troponin found to be elevated, to begin heparin.  Goal of Therapy:  Heparin level 0.3-0.7 units/ml Monitor platelets by anticoagulation protocol: Yes   Plan:  Will give heparin 2000 units IV bolus x1 followed by gtt at 800 units/hr and monitor heparin levels and CBC.  Vernard GamblesVeronda Lecia Esperanza, PharmD, BCPS  02/01/2015,4:16 AM

## 2015-02-01 NOTE — ED Notes (Signed)
Dr. Julian ReilGardner at bedside. Updated on admission status.

## 2015-02-01 NOTE — Progress Notes (Signed)
Transported to cath lab for heart cath.  Heparin turned off by Carlena SaxBlair, RN when call came from cath lab to get pt ready.

## 2015-02-01 NOTE — ED Provider Notes (Signed)
CSN: [id number]     Arrival date & time 02/01/15  0046 History   This chart was scribed for Mary Racer, MD by Freida Busman, ED Scribe. This patient was seen in room B16C/B16C and the patient's care was started 2:09 AM.   Chief Complaint  Patient presents with  . Emesis    The history is provided by the patient. No language interpreter was used.     HPI Comments:  [name] is a 44 y.o. female who presents to the Emergency Department complaining of nausea and 1 episode of vomiting that occurred after eating ~1600. Pt states she felt better after vomiting. She denies CP and SOB. She also denies CAD/MI history. She reports associated central abdominal pain at the time of symptom onset but notes the pain has resolved at this time. Pt states she feels fine at this time.  Pt has a history of ESRF and receives dialysis 3 times a week- MWF  Past Medical History  Diagnosis Date  . GERD (gastroesophageal reflux disease)   . Diabetes mellitus     IDDM  . Hypertension     states has been on med. x "a long time"  . ESRF (end stage renal failure)     dialysis M,W, F; left thigh AV graft  . History of gangrene     left foot  . Hx MRSA infection   . 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 Pneumonia 04/20/2013   Past Surgical History  Procedure Laterality Date  . Av fistula placement  10/18/2008    right thigh AV graft  . Finger debridement  06/20/2010    right middle finger  . Finger amputation  05/27/2010    right middle  . Thrombectomy / arteriovenous graft revision  01/14/2010    left superficial femoral artery; left thigh AV graft placement  . 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 Leg amputation below knee  10/11/2009    right  . Central venous catheter insertion  08/06/2009    removal right IJ Diatek cath., placement left IJ Diatek cath.  . Above knee leg amputation  05/21/2009    left  . Foot amputation  04/04/2009    left transtibial amputation  .  Excision / curettage bone cyst talus / calcaneus  03/04/2009    partial calcaneal exc. left; placement wound VAC  . Groin debridement  11/16/2008    right rectus femoris muscle flap to right groin wound; right groing debridement  . Femoral endarterectomy  11/06/2008    right common femoral; embolectomy right femoral artery; removal right femoral AV graft  . Central venous catheter insertion  07/10/2008; 12/31/2006; 01/19/2006; 06/19/2005; 09/19/2004; 09/08/2004    right IJ Diatek catheter  . Av fistula repair  07/08/2008    removal left upper arm AV graft  . Thrombectomy / arteriovenous graft revision  05/11/2007;12/31/2006; 01/18/2006; 12/16/2005    left upper arm  . Dialysis fistula creation  01/20/2007    left upper arm AV graft  . Dialysis fistula creation  01/28/2006    right upper arm AV graft  . Thrombectomy / arteriovenous graft revision  10/26/2005    left forearm AV graft  . Dialysis fistula creation  10/04/2005    left forearm AV graft  . Av fistula repair  07/14/2005    removal infected right forearm AV graft  . Thrombectomy / arteriovenous graft revision  03/11/2005; 10/20/2004    right forearm AV graft  .  Dialysis fistula creation  09/10/2004    right forearm AV graft  . Av fistula placement  07/18/2004    creation left AV fistula, radial to cephalic  . Finger exploration  07/13/2002    and repair left middle finger  . Carpal tunnel release  01/26/2012    Procedure: CARPAL TUNNEL RELEASE;  Surgeon: Nicki Reaper, MD;  Location: [facility];  Service: Orthopedics;  Laterality: Left;  . Esophagogastroduodenoscopy  06/26/2012    Procedure: ESOPHAGOGASTRODUODENOSCOPY (EGD);  Surgeon: Charna Elizabeth, MD;  Location: [facility] ENDOSCOPY;  Service: Endoscopy;  Laterality: N/A;  . Shuntogram N/A 11/12/2011    Procedure: Betsey Amen;  Surgeon: Fransisco Hertz, MD;  Location: [facility] CATH LAB;  Service: Cardiovascular;  Laterality: N/A;   Family History  Problem Relation Age of Onset  . Diabetes Mother   .  Heart disease Mother   . Hypertension Mother   . Heart attack Mother 41  . Diabetes Sister   . Hypertension Brother   . Heart attack Brother    History  Substance Use Topics  . Smoking status: Never Smoker   . Smokeless tobacco: Never Used  . Alcohol Use: No   OB History    No data available     Review of Systems  Constitutional: Negative for fever and chills.  Respiratory: Negative for shortness of breath.   Cardiovascular: Negative for chest pain.  Gastrointestinal: Positive for nausea, vomiting and abdominal pain.  Musculoskeletal: Negative for myalgias, back pain, neck pain and neck stiffness.  Skin: Negative for rash and wound.  Neurological: Negative for dizziness, weakness, light-headedness, numbness and headaches.  All other systems reviewed and are negative.     Allergies  Tramadol; Percocet; and Soap  Home Medications   Prior to Admission medications   Medication Sig Start Date End Date Taking? Authorizing Provider  amLODipine (NORVASC) 10 MG tablet Take 10 mg by mouth at bedtime.   Yes Historical Provider, MD  aspirin 81 MG chewable tablet Chew 81 mg by mouth daily.   Yes Historical Provider, MD  cinacalcet (SENSIPAR) 90 MG tablet Take 90 mg by mouth daily.   Yes Historical Provider, MD  insulin aspart protamine-insulin aspart (NOVOLOG 70/30) (70-30) 100 UNIT/ML injection Inject 6-10 Units into the skin 2 (two) times daily with a meal. Take 10 units in the morning and 6 units in the evening   Yes Historical Provider, MD  omeprazole (PRILOSEC) 20 MG capsule Take 20 mg by mouth 2 (two) times daily.   Yes Historical Provider, MD  sevelamer (RENVELA) 800 MG tablet Take 3,200 mg by mouth 2 (two) times daily with a meal.    Yes Historical Provider, MD   BP 143/80 mmHg  Temp(Src) 98.2 F (36.8 C) (Oral)  Resp 15  Ht 4' 11.84" (1.52 m)  Wt 170 lb 13.7 oz (77.5 kg)  BMI 33.54 kg/m2  SpO2 99% Physical Exam  Constitutional: She is oriented to person, place, and  time. She appears well-developed and well-nourished. No distress.  HENT:  Head: Normocephalic and atraumatic.  Mouth/Throat: Oropharynx is clear and moist.  Eyes: EOM are normal. Pupils are equal, round, and reactive to light.  Neck: Normal range of motion. Neck supple.  Cardiovascular: Normal rate and regular rhythm.   Pulmonary/Chest: Effort normal and breath sounds normal. No respiratory distress. She has no wheezes. She has no rales. She exhibits no tenderness.  Abdominal: Soft. Bowel sounds are normal. She exhibits no distension and no mass. There is no tenderness. There  is no rebound and no guarding.  Musculoskeletal: Normal range of motion. She exhibits no edema or tenderness.  Bilateral above-the-knee amputations. Palpable thrill at the graft site in left lower extremity  Neurological: She is alert and oriented to person, place, and time.  Skin: Skin is warm and dry. No rash noted. No erythema.  Psychiatric: She has a normal mood and affect. Her behavior is normal.  Nursing note and vitals reviewed.   ED Course  Procedures   DIAGNOSTIC STUDIES:  Oxygen Saturation is 99% on RA, normal by my interpretation.    COORDINATION OF CARE:  2:14 AM Discussed treatment plan with pt at bedside and pt agreed to plan.  Labs Review Labs Reviewed  CBC WITH DIFFERENTIAL/PLATELET - Abnormal; Notable for the following:    WBC 15.5 (*)    Hemoglobin 11.5 (*)    HCT 34.7 (*)    RDW 15.9 (*)    Neutrophils Relative % 88 (*)    Neutro Abs 13.6 (*)    Lymphocytes Relative 7 (*)    All other components within normal limits  COMPREHENSIVE METABOLIC PANEL - Abnormal; Notable for the following:    Chloride 93 (*)    Glucose, Bld 330 (*)    Creatinine, Ser 5.83 (*)    Total Protein 8.2 (*)    ALT 9 (*)    GFR calc non Af Amer 8 (*)    GFR calc Af Amer 9 (*)    All other components within normal limits  LIPASE, BLOOD - Abnormal; Notable for the following:    Lipase 19 (*)    All other  components within normal limits  TROPONIN I - Abnormal; Notable for the following:    Troponin I 2.03 (*)    All other components within normal limits  I-STAT TROPOININ, ED - Abnormal; Notable for the following:    Troponin i, poc 2.17 (*)    All other components within normal limits  HEPARIN LEVEL (UNFRACTIONATED)    Imaging Review No results found.   EKG Interpretation   Date/Time:  Friday February 01 2015 00:55:03 EDT Ventricular Rate:  94 PR Interval:  196 QRS Duration: 104 QT Interval:  410 QTC Calculation: 512 R Axis:   72 Text Interpretation:  Normal sinus rhythm Cannot rule out Anterior infarct  , age undetermined Prolonged QT Abnormal ECG Confirmed by Ranae PalmsYELVERTON  MD,  Nari Vannatter ([id number]) on 02/01/2015 2:14:01 AM      MDM   Final diagnoses:  NSTEMI (non-ST elevated myocardial infarction)    I personally performed the services described in this documentation, which was scribed in my presence. The recorded information has been reviewed and is accurate.  Discussed with Dr. Onalee HuaAlvarez regarding elevated troponin. Advises repeating and will evaluate patient in emergency department.  Second troponin remains above 2. Patient is currently asymptomatic. Cardiology was seen and will consult on the patient. Asked to have medicine admit. Also advises starting aspirin and heparin. Discussed with Dr. Julian ReilGardner. Will admit to step down bed.  Mary Raceravid Ania Levay, MD 02/01/15 [phone]

## 2015-02-01 NOTE — ED Notes (Signed)
Pt. reports emesis this evening after eating supper , denies diarrhea  . No fever or chills.

## 2015-02-01 NOTE — Progress Notes (Signed)
  Echocardiogram 2D Echocardiogram has been performed.  [name], [name] 02/01/2015, 12:50 PM

## 2015-02-01 NOTE — Consult Note (Deleted)
[facility] Renal Consultation Note  Indication for Consultation:  Management of ESRD/hemodialysis; anemia, hypertension/volume and secondary hyperparathyroidism  HPI: [name] is a 44 y.o. female with bilat AKA , ESRD, and HTN on HD MWF via L thigh AVG, on HD > 31yr.  Developed N/V after eating [employer], then got SOB and came to ED where trop was > 2, EKG showed lateral TWI and nonspec ST-T changes. Seen by cards started on asa / IV hep. Trop cont'd to rise so taken to cath lab. Specifics not available but cards note today says DES to LCX, one year of Brilinta and ASA. EF 55% by echo. Had flash pulm edema after cath and HD done last evenign with 3500 cc off and SOB is now resolved.   Does HD MWF, compliant w HD , doesn't miss.  No couhg, CP, SOB at this time.  No nv/d or abd pain. No fever , chills or sweats. Bilat AKA.     Past Medical History  Diagnosis Date  . GERD (gastroesophageal reflux disease)   . Diabetes mellitus     IDDM  . Hypertension     states has been on med. x "a long time"  . ESRF (end stage renal failure)     dialysis M,W, F; left thigh AV graft  . History of gangrene     left foot  . Hx MRSA infection   . 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 Pneumonia 04/20/2013    Past Surgical History  Procedure Laterality Date  . Av fistula placement  10/18/2008    right thigh AV graft  . Finger debridement  06/20/2010    right middle finger  . Finger amputation  05/27/2010    right middle  . Thrombectomy / arteriovenous graft revision  01/14/2010    left superficial femoral artery; left thigh AV graft placement  . 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 Leg amputation below knee  10/11/2009    right  . Central venous catheter insertion  08/06/2009    removal right IJ Diatek cath., placement left IJ Diatek cath.  . Above knee leg amputation  05/21/2009    left  . Foot amputation  04/04/2009    left transtibial amputation  .  Excision / curettage bone cyst talus / calcaneus  03/04/2009    partial calcaneal exc. left; placement wound VAC  . Groin debridement  11/16/2008    right rectus femoris muscle flap to right groin wound; right groing debridement  . Femoral endarterectomy  11/06/2008    right common femoral; embolectomy right femoral artery; removal right femoral AV graft  . Central venous catheter insertion  07/10/2008; 12/31/2006; 01/19/2006; 06/19/2005; 09/19/2004; 09/08/2004    right IJ Diatek catheter  . Av fistula repair  07/08/2008    removal left upper arm AV graft  . Thrombectomy / arteriovenous graft revision  05/11/2007;12/31/2006; 01/18/2006; 12/16/2005    left upper arm  . Dialysis fistula creation  01/20/2007    left upper arm AV graft  . Dialysis fistula creation  01/28/2006    right upper arm AV graft  . Thrombectomy / arteriovenous graft revision  10/26/2005    left forearm AV graft  . Dialysis fistula creation  10/04/2005    left forearm AV graft  . Av fistula repair  07/14/2005    removal infected right forearm AV graft  . Thrombectomy / arteriovenous graft revision  03/11/2005; 10/20/2004  right forearm AV graft  . Dialysis fistula creation  09/10/2004    right forearm AV graft  . Av fistula placement  07/18/2004    creation left AV fistula, radial to cephalic  . Finger exploration  07/13/2002    and repair left middle finger  . Carpal tunnel release  01/26/2012    Procedure: CARPAL TUNNEL RELEASE;  Surgeon: Wynonia Sours, MD;  Location: [place];  Service: Orthopedics;  Laterality: Left;  . Esophagogastroduodenoscopy  06/26/2012    Procedure: ESOPHAGOGASTRODUODENOSCOPY (EGD);  Surgeon: Juanita Craver, MD;  Location: [facility] ENDOSCOPY;  Service: Endoscopy;  Laterality: N/A;  . Shuntogram N/A 11/12/2011    Procedure: Earney Mallet;  Surgeon: Conrad Chrisman, MD;  Location: [facility] CATH LAB;  Service: Cardiovascular;  Laterality: N/A;      Family History  Problem Relation Age of Onset  . Diabetes Mother    . Heart disease Mother   . Hypertension Mother   . Heart attack Mother 47  . Diabetes Sister   . Hypertension Brother   . Heart attack Brother       reports that she has never smoked. She has never used smokeless tobacco. She reports that she does not drink alcohol or use illicit drugs.   Allergies  Allergen Reactions  . Tramadol Itching  . Percocet [Oxycodone-Acetaminophen] Other (See Comments)    GI UPSET  . Soap Itching    IVORY SOAP    Prior to Admission medications   Medication Sig Start Date End Date Taking? Authorizing Provider  amLODipine (NORVASC) 10 MG tablet Take 10 mg by mouth at bedtime.   Yes Historical Provider, MD  aspirin 81 MG chewable tablet Chew 81 mg by mouth daily.   Yes Historical Provider, MD  cinacalcet (SENSIPAR) 90 MG tablet Take 90 mg by mouth daily.   Yes Historical Provider, MD  insulin aspart protamine-insulin aspart (NOVOLOG 70/30) (70-30) 100 UNIT/ML injection Inject 6-10 Units into the skin 2 (two) times daily with a meal. Take 10 units in the morning and 6 units in the evening   Yes Historical Provider, MD  omeprazole (PRILOSEC) 20 MG capsule Take 20 mg by mouth 2 (two) times daily.   Yes Historical Provider, MD  sevelamer (RENVELA) 800 MG tablet Take 3,200 mg by mouth 2 (two) times daily with a meal.    Yes Historical Provider, MD     Results for orders placed or performed during the hospital encounter of 02/01/15 (from the past 48 hour(s))  CBC with Differential     Status: Abnormal   Collection Time: 02/01/15  1:48 AM  Result Value Ref Range   WBC 15.5 (H) 4.0 - 10.5 K/uL   RBC 4.02 3.87 - 5.11 MIL/uL   Hemoglobin 11.5 (L) 12.0 - 15.0 g/dL   HCT 34.7 (L) 36.0 - 46.0 %   MCV 86.3 78.0 - 100.0 fL   MCH 28.6 26.0 - 34.0 pg   MCHC 33.1 30.0 - 36.0 g/dL   RDW 15.9 (H) 11.5 - 15.5 %   Platelets 282 150 - 400 K/uL   Neutrophils Relative % 88 (H) 43 - 77 %   Neutro Abs 13.6 (H) 1.7 - 7.7 K/uL   Lymphocytes Relative 7 (L) 12 - 46 %    Lymphs Abs 1.0 0.7 - 4.0 K/uL   Monocytes Relative 5 3 - 12 %   Monocytes Absolute 0.8 0.1 - 1.0 K/uL   Eosinophils Relative 0 0 - 5 %   Eosinophils Absolute 0.1  0.0 - 0.7 K/uL   Basophils Relative 0 0 - 1 %   Basophils Absolute 0.0 0.0 - 0.1 K/uL  Comprehensive metabolic panel     Status: Abnormal   Collection Time: 02/01/15  1:48 AM  Result Value Ref Range   Sodium 136 135 - 145 mmol/L   Potassium 3.5 3.5 - 5.1 mmol/L   Chloride 93 (L) 101 - 111 mmol/L   CO2 29 22 - 32 mmol/L   Glucose, Bld 330 (H) 65 - 99 mg/dL   BUN 14 6 - 20 mg/dL   Creatinine, Ser 5.83 (H) 0.44 - 1.00 mg/dL   Calcium 9.8 8.9 - 10.3 mg/dL   Total Protein 8.2 (H) 6.5 - 8.1 g/dL   Albumin 3.5 3.5 - 5.0 g/dL   AST 18 15 - 41 U/L   ALT 9 (L) 14 - 54 U/L   Alkaline Phosphatase 65 38 - 126 U/L   Total Bilirubin 0.5 0.3 - 1.2 mg/dL   GFR calc non Af Amer 8 (L) >60 mL/min   GFR calc Af Amer 9 (L) >60 mL/min    Comment: (NOTE) The eGFR has been calculated using the CKD EPI equation. This calculation has not been validated in all clinical situations. eGFR's persistently <60 mL/min signify possible Chronic Kidney Disease.    Anion gap 14 5 - 15  Lipase, blood     Status: Abnormal   Collection Time: 02/01/15  1:48 AM  Result Value Ref Range   Lipase 19 (L) 22 - 51 U/L  Troponin I     Status: Abnormal   Collection Time: 02/01/15  1:48 AM  Result Value Ref Range   Troponin I 2.03 (HH) <0.031 ng/mL    Comment:        POSSIBLE MYOCARDIAL ISCHEMIA. SERIAL TESTING RECOMMENDED. CRITICAL RESULT CALLED TO, READ BACK BY AND VERIFIED WITH: OLSEN,E RN 02/01/2015 0356 JORDANS REPEATED TO VERIFY   I-stat troponin, ED (only if pt is 45 y.o. or older & pain is above umbilicus)  not at [facility], [facility]     Status: Abnormal   Collection Time: 02/01/15  1:57 AM  Result Value Ref Range   Troponin i, poc 2.17 (HH) 0.00 - 0.08 ng/mL   Comment NOTIFIED PHYSICIAN    Comment 3            Comment: Due to the release kinetics of  cTnI, a negative result within the first hours of the onset of symptoms does not rule out myocardial infarction with certainty. If myocardial infarction is still suspected, repeat the test at appropriate intervals.   Troponin I     Status: Abnormal   Collection Time: 02/01/15  5:21 AM  Result Value Ref Range   Troponin I 2.22 (HH) <0.031 ng/mL    Comment:        POSSIBLE MYOCARDIAL ISCHEMIA. SERIAL TESTING RECOMMENDED. CRITICAL VALUE NOTED.  VALUE IS CONSISTENT WITH PREVIOUSLY REPORTED AND CALLED VALUE. REPEATED TO VERIFY   TSH     Status: None   Collection Time: 02/01/15  5:21 AM  Result Value Ref Range   TSH 2.358 0.350 - 4.500 uIU/mL  CBG monitoring, ED     Status: Abnormal   Collection Time: 02/01/15  5:47 AM  Result Value Ref Range   Glucose-Capillary 56 (L) 65 - 99 mg/dL  CBG monitoring, ED     Status: Abnormal   Collection Time: 02/01/15  5:48 AM  Result Value Ref Range   Glucose-Capillary 138 (H) 65 - 99 mg/dL  CBG monitoring, ED     Status: Abnormal   Collection Time: 02/01/15  8:55 AM  Result Value Ref Range   Glucose-Capillary 152 (H) 65 - 99 mg/dL  MRSA PCR Screening     Status: None   Collection Time: 02/01/15 10:37 AM  Result Value Ref Range   MRSA by PCR NEGATIVE NEGATIVE    Comment:        The GeneXpert MRSA Assay (FDA approved for NASAL specimens only), is one component of a comprehensive MRSA colonization surveillance program. It is not intended to diagnose MRSA infection nor to guide or monitor treatment for MRSA infections.   Troponin I     Status: Abnormal   Collection Time: 02/01/15 11:11 AM  Result Value Ref Range   Troponin I 2.20 (HH) <0.031 ng/mL    Comment:        POSSIBLE MYOCARDIAL ISCHEMIA. SERIAL TESTING RECOMMENDED. REPEATED TO VERIFY CRITICAL VALUE NOTED.  VALUE IS CONSISTENT WITH PREVIOUSLY REPORTED AND CALLED VALUE.   Protime-INR     Status: None   Collection Time: 02/01/15 11:11 AM  Result Value Ref Range   Prothrombin  Time 14.5 11.6 - 15.2 seconds   INR 1.11 0.00 - 1.49  Glucose, capillary     Status: Abnormal   Collection Time: 02/01/15 11:11 AM  Result Value Ref Range   Glucose-Capillary 145 (H) 65 - 99 mg/dL  Heparin level (unfractionated)     Status: Abnormal   Collection Time: 02/01/15  1:35 PM  Result Value Ref Range   Heparin Unfractionated 0.16 (L) 0.30 - 0.70 IU/mL    Comment:        IF HEPARIN RESULTS ARE BELOW EXPECTED VALUES, AND PATIENT DOSAGE HAS BEEN CONFIRMED, SUGGEST FOLLOW UP TESTING OF ANTITHROMBIN III LEVELS.    Physical Exam: Filed Vitals:   02/01/15 1421  BP: 165/73  Pulse: 87  Temp: 97.8 F (36.6 C)  Resp: 20     Exam Alert, pelasant vibrant No jvd Chest clear bilat RRR no mrg Abd soft, ntnd +bs bilat AKA L thigh AVG patent Neuro is ox 3, nf  HD: [facility] MWF 4h   69.5kg   2/2 bath    Hep 7000   L fem AVG Mircera 75  last 01/23/15  Calcitrtiol 2.0 ug Other op lab hgb 11.4 ,23 %tfs/ Ca 10.0 phos 7.0  pth 678   Assessment: 1. SOB/ NSTEMI - s/p cath with DES to LCX lastnight 2. Pulm edema - improved clinically after HD last night and after stenting. Is at dry wt now 3. HTN on norvasc at home 4. Vol at dry wt 5. 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 Anemia cont esa as needed  Plan - check CXR, follow clinically. No need for additional HD today that I can see. If wet by CXR though will plan another HD today.   Kelly Splinter MD (pgr) [phone]    [phone] 02/02/2015, 10:48 AM

## 2015-02-01 NOTE — Progress Notes (Signed)
Utilization Review Completed.Ernie Sagrero T6/10/2014  

## 2015-02-01 NOTE — Progress Notes (Signed)
Call from cath lab that pt not returning to room and will be transferred to CCU.  Pt's wheelchair with label of "Breezy" and arm band added to arm rest with patient name of [name], medications, and belongings (2 pieces of clothing and 1 hat) transported to holding area of cath lab by Aris Georgiaidier Awaka, NT.

## 2015-02-01 NOTE — Consult Note (Signed)
Referring Physician: Dr. Ranae Palms Primary Physician: Primary Cardiologist: Reason for Consultation: elevated troponin   HPI: [name] is an unfortunate 44 yo woman with long standing history of IDDM, HTN, ESRD on HD and PVD s/p bilateral AKA who presented to the ED today with CC of nausea and vomiting.  She states that she ate some chinese food and it didn't sit well.  Developed nausea and vomiting with some mid abdominal pain/discomfort.  Emesis was non-bilious, non-bloody.  She denies any prior cardiac history.  She denies any CP, SOB, palpitations, diaphoresis, orthopnea.  Work up in ED included an ECG showing SR, lateral TWI and nonspecific ST-T changes without significant changes from priors but troponin of 2.03 prompting cardiology consult.   Of note, she uses a wheelchair as well as a motorized chair.  Her level of exertion is somewhat limited, however, she denies any cardiopulmonary symptoms at baseline activity.    Review of Systems:     Cardiac Review of Systems: {Y] = yes  = no  Chest Pain [    ]  Resting SOB [   ] Exertional SOB  [  ]  Orthopnea [  ]   Pedal Edema [   ]    Palpitations [  ] Syncope  [  ]   Presyncope [   ]  General Review of Systems: [Y] = yes [  ]=no Constitional: recent weight change [  ]; anorexia [  ]; fatigue [  ]; nausea [x  ]; night sweats [  ]; fever [  ]; or chills [  ];                                                                     Eyes : blurred vision [  ]; diplopia [   ]; vision changes [  ];  Amaurosis fugax[  ]; Resp: cough [  ];  wheezing[  ];  hemoptysis[  ];  PND [  ];  GI:  gallstones[  ], vomiting[x  ];  dysphagia[  ]; melena[  ];  hematochezia [  ]; heartburn[  ];   GU: kidney stones [  ]; hematuria[  ];   dysuria [  ];  nocturia[  ]; incontinence [  ];             Skin: rash, swelling[  ];, hair loss[  ];  peripheral edema[  ];  or itching[  ]; Musculosketetal: myalgias[  ];  joint swelling[  ];  joint erythema[  ];  joint  pain[  ];  back pain[  ];  Heme/Lymph: bruising[  ];  bleeding[  ];  anemia[  ];  Neuro: TIA[  ];  headaches[  ];  stroke[  ];  vertigo[  ];  seizures[  ];   paresthesias[  ];  difficulty walking[  ];  Psych:depression[  ]; anxiety[  ];  Endocrine: diabetes[x  ];  thyroid dysfunction[  ];  Other:  Past Medical History  Diagnosis Date  . GERD (gastroesophageal reflux disease)   . Diabetes mellitus     IDDM  . Hypertension     states has been on med. x "a long time"  . ESRF (end stage renal failure)  dialysis M,W, F; left thigh AV graft  . History of gangrene     left foot  . Hx MRSA infection   . 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 Pneumonia 04/20/2013     (Not in a hospital admission)   . heparin  2,000 Units Intravenous Once    Infusions: . heparin     No current facility-administered medications on file prior to encounter.   Current Outpatient Prescriptions on File Prior to Encounter  Medication Sig Dispense Refill  . amLODipine (NORVASC) 10 MG tablet Take 10 mg by mouth at bedtime.    Marland Kitchen aspirin 81 MG chewable tablet Chew 81 mg by mouth daily.    . insulin aspart protamine-insulin aspart (NOVOLOG 70/30) (70-30) 100 UNIT/ML injection Inject 6-10 Units into the skin 2 (two) times daily with a meal. Take 10 units in the morning and 6 units in the evening    . omeprazole (PRILOSEC) 20 MG capsule Take 20 mg by mouth 2 (two) times daily.    . sevelamer (RENVELA) 800 MG tablet Take 3,200 mg by mouth 2 (two) times daily with a meal.      Allergies  Allergen Reactions  . Tramadol Itching  . Percocet [Oxycodone-Acetaminophen] Other (See Comments)    GI UPSET  . Soap Itching    IVORY SOAP    History   Social History  . Marital Status: Single    Spouse Name: N/A  . Number of Children: N/A  . Years of Education: N/A   Occupational History  . Not on file.   Social History Main Topics  . Smoking status: Never Smoker   . Smokeless tobacco: Never Used    . Alcohol Use: No  . Drug Use: No  . Sexual Activity: Not on file   Other Topics Concern  . Not on file   Social History Narrative    Family History  Problem Relation Age of Onset  . Diabetes Mother   . Heart disease Mother   . Hypertension Mother   . Heart attack Mother 48  . Diabetes Sister   . Hypertension Brother   . Heart attack Brother     PHYSICAL EXAM: Filed Vitals:   02/01/15 0400  BP:   Temp:   Resp: 15    No intake or output data in the 24 hours ending 02/01/15 0436  General:  Chronically ill appearing. NAD. No respiratory difficulty HEENT: Normocephalic, atraumatic, PERRL, EOMI, anicteric, dry MM, white exudate on tongue and OP. Neck: supple. no JVD. Carotids 2+ bilat; no bruits. No lymphadenopathy or thryomegaly appreciated. Cor: PMI nondisplaced. Regular rate & rhythm. No rubs, gallops or murmurs.  B UE and LE fistulae Lungs: CTA Abdomen: soft, nontender, nondistended. No hepatosplenomegaly. No bruits or masses. Good bowel sounds. Extremities: Bilateral AKA, no edema.   Neuro: alert & oriented x 3, cranial nerves grossly intact. moves all 4 extremities w/o difficulty. Affect pleasant.  ECG: SR, TWI lateral leads, nonspecific ST-T, no change c/w priors.  Results for orders placed or performed during the hospital encounter of 02/01/15 (from the past 24 hour(s))  CBC with Differential     Status: Abnormal   Collection Time: 02/01/15  1:48 AM  Result Value Ref Range   WBC 15.5 (H) 4.0 - 10.5 K/uL   RBC 4.02 3.87 - 5.11 MIL/uL   Hemoglobin 11.5 (L) 12.0 - 15.0 g/dL   HCT 16.1 (L) 09.6 - 04.5 %   MCV 86.3 78.0 - 100.0 fL  MCH 28.6 26.0 - 34.0 pg   MCHC 33.1 30.0 - 36.0 g/dL   RDW 09.815.9 (H) 11.911.5 - 14.715.5 %   Platelets 282 150 - 400 K/uL   Neutrophils Relative % 88 (H) 43 - 77 %   Neutro Abs 13.6 (H) 1.7 - 7.7 K/uL   Lymphocytes Relative 7 (L) 12 - 46 %   Lymphs Abs 1.0 0.7 - 4.0 K/uL   Monocytes Relative 5 3 - 12 %   Monocytes Absolute 0.8 0.1 - 1.0  K/uL   Eosinophils Relative 0 0 - 5 %   Eosinophils Absolute 0.1 0.0 - 0.7 K/uL   Basophils Relative 0 0 - 1 %   Basophils Absolute 0.0 0.0 - 0.1 K/uL  Comprehensive metabolic panel     Status: Abnormal   Collection Time: 02/01/15  1:48 AM  Result Value Ref Range   Sodium 136 135 - 145 mmol/L   Potassium 3.5 3.5 - 5.1 mmol/L   Chloride 93 (L) 101 - 111 mmol/L   CO2 29 22 - 32 mmol/L   Glucose, Bld 330 (H) 65 - 99 mg/dL   BUN 14 6 - 20 mg/dL   Creatinine, Ser 8.295.83 (H) 0.44 - 1.00 mg/dL   Calcium 9.8 8.9 - 56.210.3 mg/dL   Total Protein 8.2 (H) 6.5 - 8.1 g/dL   Albumin 3.5 3.5 - 5.0 g/dL   AST 18 15 - 41 U/L   ALT 9 (L) 14 - 54 U/L   Alkaline Phosphatase 65 38 - 126 U/L   Total Bilirubin 0.5 0.3 - 1.2 mg/dL   GFR calc non Af Amer 8 (L) >60 mL/min   GFR calc Af Amer 9 (L) >60 mL/min   Anion gap 14 5 - 15  Lipase, blood     Status: Abnormal   Collection Time: 02/01/15  1:48 AM  Result Value Ref Range   Lipase 19 (L) 22 - 51 U/L  Troponin I     Status: Abnormal   Collection Time: 02/01/15  1:48 AM  Result Value Ref Range   Troponin I 2.03 (HH) <0.031 ng/mL  I-stat troponin, ED (only if pt is 45 y.o. or older & pain is above umbilicus)  [facility]     Status: Abnormal   Collection Time: 02/01/15  1:57 AM  Result Value Ref Range   Troponin i, poc 2.17 (HH) 0.00 - 0.08 ng/mL   Comment NOTIFIED PHYSICIAN    Comment 3           No results found.   ASSESSMENT: 44 yo woman with long standing history of IDDM, HTN, ESRD on HD and PVD s/p bilateral AKA who presented to the ED today with CC of nausea, vomiting, mid abdominal discomfort noted to have an elevated troponin.  It is difficult to put the troponin into context without ACS symptoms.  However, patient is diabetic with evidence of end-organ damage which can make her presentation atypical.  Main concern is that her N/V were anginal equivalents in this diabetic patient and thus an NSTEMI.  She is ESRD but has had prior troponins  checked that were not elevated.  Furthermore, she denies any viral like symptoms and/or chest symptoms to suggest a myocarditis type process.   PLAN/DISCUSSION: Recommend ASA and heparin while cycling troponins.   She has an indication for statin therapy.  Consider low dose BB given BP and HR Recommend A1c, lipid panel and TSH Echocardiogram in AM NPO in case of consideration for invasive  ischemic evaluation Risk factor modification

## 2015-02-01 NOTE — Progress Notes (Signed)
ANTICOAGULATION CONSULT NOTE Pharmacy Consult for heparin Indication: chest pain/ACS  Allergies  Allergen Reactions  . Tramadol Itching  . Percocet [Oxycodone-Acetaminophen] Other (See Comments)    GI UPSET  . Soap Itching    IVORY SOAP    Patient Measurements: Height: 5\' 3"  (160 cm) Weight: 163 lb 9.3 oz (74.2 kg) (bed scale) IBW/kg (Calculated) : 52.4 Heparin Dosing Weight: 60kg  Vital Signs: Temp: 98 F (36.7 C) (06/03 1028) Temp Source: Oral (06/03 1028) BP: 146/92 mmHg (06/03 1238) Pulse Rate: 89 (06/03 1238)  Labs:  Recent Labs  02/01/15 0148 02/01/15 0521 02/01/15 1111 02/01/15 1335  HGB 11.5*  --   --   --   HCT 34.7*  --   --   --   PLT 282  --   --   --   LABPROT  --   --  14.5  --   INR  --   --  1.11  --   HEPARINUNFRC  --   --   --  0.16*  CREATININE 5.83*  --   --   --   TROPONINI 2.03* 2.22* 2.20*  --     Estimated Creatinine Clearance: 11.9 mL/min (by C-G formula based on Cr of 5.83).    Assessment: 44yo female c/o emesis associated w/ central abdominal pain, troponin found to be elevated. Pharmacy consulted for heparin for r/o NSTEMI.  First HL low at 0.16 after 2000 unit bolus and drip at 800 units/hr.  CBC OK, no bleeding reported.     Goal of Therapy:  Heparin level 0.3-0.7 units/ml Monitor platelets by anticoagulation protocol: Yes   Plan:  -2000 unit bolus and increase heparin drip to 1000 units/hr -check 8 hr HL or f/u after cath (add on list) -daily HL/CBC   Herby AbrahamMichelle T. Kendria Halberg, Pharm.D. [phone] 02/01/2015 2:04 PM

## 2015-02-01 NOTE — Progress Notes (Addendum)
[facility]  Mrs [name] presented to the ER this morning after an episode of vomiting and was found to have elevated troponin level. She has been evaluated by cardiology and will be going for a cardiac cath today. I have evaluated her. She has no complaints.   Blood pressure 165/73, pulse 87, temperature 97.8 F (36.6 C), temperature source Oral, resp. rate 20, height 5\' 3"  (1.6 m), weight 74.2 kg (163 lb 9.3 oz), SpO2 93 %.  Lungs : CTA b/l  CVA: RRR, no murmurs Abd: soft, NT, ND, BS+ MSK: b/l AKA  Principal Problem:  NSTEMI -  cath today  Active Problems:    ESRD (end stage renal disease) on dialysis - M/ W/ F dialysis- I have notified renal    Diabetes mellitus - will resume 70/30 insulin  HTN - started on Metoprolol   Calvert CantorSaima Kindrick Lankford, MD

## 2015-02-01 NOTE — Progress Notes (Signed)
Patient has arrived to unit room 3E07. Patient alert and oriented x4 with no complaints at this time. VSS. Call bell in reach. Will monitor

## 2015-02-01 NOTE — H&P (View-Only) (Signed)
The decision was made this morning to proceed with cardiac catheterization as her level of trop rise was higher than expected event taking into account of her ESRD. She is not having any CP. N/V improved.  I have discussed with her benefit and risk of the procedure include bleeding, vascular injury, arrhythmia, MI, stroke, loss of life or limb. She displayed clear understanding and agree to proceed.  She will need HD either later today or tomorrow after cath  Signed, Kimyah Frein PA Azalee CoursePager: [phone]

## 2015-02-01 NOTE — Interval H&P Note (Signed)
Cath Lab Visit (complete for each Cath Lab visit)  Clinical Evaluation Leading to the Procedure:   ACS: Yes.    Non-ACS:    Anginal Classification: CCS IV  Anti-ischemic medical therapy: Maximal Therapy (2 or more classes of medications)  Non-Invasive Test Results: No non-invasive testing performed  Prior CABG: No previous CABG      History and Physical Interval Note:  02/01/2015 4:34 PM  [name]  has presented today for surgery, with the diagnosis of cp  The various methods of treatment have been discussed with the patient and family. After consideration of risks, benefits and other options for treatment, the patient has consented to  Procedure(s): Left Heart Cath and Coronary Angiography (N/A) as a surgical intervention .  The patient's history has been reviewed, patient examined, no change in status, stable for surgery.  I have reviewed the patient's chart and labs.  Questions were answered to the patient's satisfaction.     Federica Allport A

## 2015-02-01 NOTE — Progress Notes (Signed)
The decision was made this morning to proceed with cardiac catheterization as her level of trop rise was higher than expected event taking into account of her ESRD. She is not having any CP. N/V improved.  I have discussed with her benefit and risk of the procedure include bleeding, vascular injury, arrhythmia, MI, stroke, loss of life or limb. She displayed clear understanding and agree to proceed.  She will need HD either later today or tomorrow after cath  Signed, Tamea Bai PA Pager: [phone]  

## 2015-02-01 NOTE — Progress Notes (Signed)
Patient watching the Cardiac cath video.

## 2015-02-01 NOTE — Progress Notes (Signed)
eLink Physician-Brief Progress Note Patient Name: [name] DOB: 1970/11/19 MRN: [id number]   Date of Service  02/01/2015  HPI/Events of Note  itching  eICU Interventions  Benadryl IV     Intervention Category Minor Interventions: Routine modifications to care plan (e.g. PRN medications for pain, fever)  ALVA,RAKESH V. 02/01/2015, 9:28 PM

## 2015-02-01 NOTE — ED Notes (Signed)
PLAN TO KEEP PT NPO FOR POSSIBLE INVASIVE PROCEDURE, Q FOUR HR CBG AND SLIDING SCALE INSULIN.

## 2015-02-01 NOTE — ED Notes (Signed)
CBG 138. No insulin drip at this time

## 2015-02-01 NOTE — H&P (Signed)
[facility] History and Physical  [name] HQI:[id number] DOB: 09-25-1970 DOA: 02/01/2015  Referring physician: EDP PCP: Billee Cashing, MD   Chief Complaint: N/V   HPI: [name] is a 44 y.o. female with history of ESRD dialysis MWF, IDDM, bilateral AKA, patient presents to ED today with CC of N/V.  Symptoms onset after eating chinese food last night.  NBNB emesis, no prior cardiac history.  No CP, SOB, palpitations, diaphoresis.  Troponin in ED was 2.03.  She has never had an elevated troponin on prior measurements.  As a result there is concern for atypical presentation of NSTEMI in this patient with DM.  Review of Systems: Systems reviewed.  As above, otherwise negative  Past Medical History  Diagnosis Date  . GERD (gastroesophageal reflux disease)   . Diabetes mellitus     IDDM  . Hypertension     states has been on med. x "a long time"  . ESRF (end stage renal failure)     dialysis M,W, F; left thigh AV graft  . History of gangrene     left foot  . Hx MRSA infection   . 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 Pneumonia 04/20/2013   Past Surgical History  Procedure Laterality Date  . Av fistula placement  10/18/2008    right thigh AV graft  . Finger debridement  06/20/2010    right middle finger  . Finger amputation  05/27/2010    right middle  . Thrombectomy / arteriovenous graft revision  01/14/2010    left superficial femoral artery; left thigh AV graft placement  . 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 Leg amputation below knee  10/11/2009    right  . Central venous catheter insertion  08/06/2009    removal right IJ Diatek cath., placement left IJ Diatek cath.  . Above knee leg amputation  05/21/2009    left  . Foot amputation  04/04/2009    left transtibial amputation  . Excision / curettage bone cyst talus / calcaneus  03/04/2009    partial calcaneal exc. left; placement wound VAC  . Groin debridement  11/16/2008    right  rectus femoris muscle flap to right groin wound; right groing debridement  . Femoral endarterectomy  11/06/2008    right common femoral; embolectomy right femoral artery; removal right femoral AV graft  . Central venous catheter insertion  07/10/2008; 12/31/2006; 01/19/2006; 06/19/2005; 09/19/2004; 09/08/2004    right IJ Diatek catheter  . Av fistula repair  07/08/2008    removal left upper arm AV graft  . Thrombectomy / arteriovenous graft revision  05/11/2007;12/31/2006; 01/18/2006; 12/16/2005    left upper arm  . Dialysis fistula creation  01/20/2007    left upper arm AV graft  . Dialysis fistula creation  01/28/2006    right upper arm AV graft  . Thrombectomy / arteriovenous graft revision  10/26/2005    left forearm AV graft  . Dialysis fistula creation  10/04/2005    left forearm AV graft  . Av fistula repair  07/14/2005    removal infected right forearm AV graft  . Thrombectomy / arteriovenous graft revision  03/11/2005; 10/20/2004    right forearm AV graft  . Dialysis fistula creation  09/10/2004    right forearm AV graft  . Av fistula placement  07/18/2004    creation left AV fistula, radial to cephalic  . Finger exploration  07/13/2002    and repair left  middle finger  . Carpal tunnel release  01/26/2012    Procedure: CARPAL TUNNEL RELEASE;  Surgeon: Nicki ReaperGary R Kuzma, MD;  Location: [facility];  Service: Orthopedics;  Laterality: Left;  . Esophagogastroduodenoscopy  06/26/2012    Procedure: ESOPHAGOGASTRODUODENOSCOPY (EGD);  Surgeon: Charna ElizabethJyothi Mann, MD;  [facility];  Service: Endoscopy;  Laterality: N/A;  . Shuntogram N/A 11/12/2011    Procedure: Betsey AmenSHUNTOGRAM;  Surgeon: Fransisco HertzBrian L Chen, MD;  [facility];  Service: Cardiovascular;  Laterality: N/A;   Social History:  reports that she has never smoked. She has never used smokeless tobacco. She reports that she does not drink alcohol or use illicit drugs.  Allergies  Allergen Reactions  . Tramadol Itching  . Percocet  [Oxycodone-Acetaminophen] Other (See Comments)    GI UPSET  . Soap Itching    IVORY SOAP    Family History  Problem Relation Age of Onset  . Diabetes Mother   . Heart disease Mother   . Hypertension Mother   . Heart attack Mother 1764  . Diabetes Sister   . Hypertension Brother   . Heart attack Brother      Prior to Admission medications   Medication Sig Start Date End Date Taking? Authorizing Provider  amLODipine (NORVASC) 10 MG tablet Take 10 mg by mouth at bedtime.   Yes Historical Provider, MD  aspirin 81 MG chewable tablet Chew 81 mg by mouth daily.   Yes Historical Provider, MD  cinacalcet (SENSIPAR) 90 MG tablet Take 90 mg by mouth daily.   Yes Historical Provider, MD  insulin aspart protamine-insulin aspart (NOVOLOG 70/30) (70-30) 100 UNIT/ML injection Inject 6-10 Units into the skin 2 (two) times daily with a meal. Take 10 units in the morning and 6 units in the evening   Yes Historical Provider, MD  omeprazole (PRILOSEC) 20 MG capsule Take 20 mg by mouth 2 (two) times daily.   Yes Historical Provider, MD  sevelamer (RENVELA) 800 MG tablet Take 3,200 mg by mouth 2 (two) times daily with a meal.    Yes Historical Provider, MD   Physical Exam: Filed Vitals:   02/01/15 0400  BP:   Temp:   Resp: 15    BP 143/80 mmHg  Temp(Src) 98.2 F (36.8 C) (Oral)  Resp 15  Ht 4' 11.84" (1.52 m)  Wt 77.5 kg (170 lb 13.7 oz)  BMI 33.54 kg/m2  SpO2 99%  General Appearance:    Alert, oriented, no distress, appears stated age  Head:    Normocephalic, atraumatic  Eyes:    PERRL, EOMI, sclera non-icteric        Nose:   Nares without drainage or epistaxis. Mucosa, turbinates normal  Throat:   Moist mucous membranes. Oropharynx without erythema or exudate.  Neck:   Supple. No carotid bruits.  No thyromegaly.  No lymphadenopathy.   Back:     No CVA tenderness, no spinal tenderness  Lungs:     Clear to auscultation bilaterally, without wheezes, rhonchi or rales  Chest wall:    No  tenderness to palpitation  Heart:    Regular rate and rhythm without murmurs, gallops, rubs  Abdomen:     Soft, non-tender, nondistended, normal bowel sounds, no organomegaly  Genitalia:    deferred  Rectal:    deferred  Extremities:   No clubbing, cyanosis or edema.  Pulses:   2+ and symmetric all extremities  Skin:   Skin color, texture, turgor normal, no rashes or lesions  Lymph nodes:  Cervical, supraclavicular, and axillary nodes normal  Neurologic:   CNII-XII intact. Normal strength, sensation and reflexes      throughout    Labs on Admission:  Basic Metabolic Panel:  Recent Labs Lab 02/01/15 0148  NA 136  K 3.5  CL 93*  CO2 29  GLUCOSE 330*  BUN 14  CREATININE 5.83*  CALCIUM 9.8   Liver Function Tests:  Recent Labs Lab 02/01/15 0148  AST 18  ALT 9*  ALKPHOS 65  BILITOT 0.5  PROT 8.2*  ALBUMIN 3.5    Recent Labs Lab 02/01/15 0148  LIPASE 19*   No results for input(s): AMMONIA in the last 168 hours. CBC:  Recent Labs Lab 02/01/15 0148  WBC 15.5*  NEUTROABS 13.6*  HGB 11.5*  HCT 34.7*  MCV 86.3  PLT 282   Cardiac Enzymes:  Recent Labs Lab 02/01/15 0148  TROPONINI 2.03*    BNP (last 3 results) No results for input(s): PROBNP in the last 8760 hours. CBG: No results for input(s): GLUCAP in the last 168 hours.  Radiological Exams on Admission: No results found.  EKG: Independently reviewed.  Assessment/Plan Principal Problem:   Elevated troponin Active Problems:   ESRD (end stage renal disease) on dialysis   Diabetes mellitus  1) Elevated troponin -  See cards consult note  Heparin gtt  Adding min dose of metoprolol XL 12.5mg  daily  NPO  Serial trops  TSH, A1C, lipid profile  2d echo  Lipitor  daily ordered  2) ESRD - dialysis MWF  Left voicemail on non-urgent consult line for nephrology so they see / dialyze her tomorrow at some point.  3) IDDM - currently BGLs running in the 300s in the ED  glucostabilizer  ordered      Code Status: Full Code  Family Communication: Family at bedside Disposition Plan: Admit to inpatient   Time spent: 70 min  GARDNER, JARED M. [facility] Pager [phone]  If 7AM-7PM, please contact the day team taking care of the patient [email or web address] Password [email or web address] 02/01/2015, 5:11 AM

## 2015-02-02 ENCOUNTER — Inpatient Hospital Stay (HOSPITAL_COMMUNITY): Payer: Medicare Other

## 2015-02-02 DIAGNOSIS — Z89611 Acquired absence of right leg above knee: Secondary | ICD-10-CM

## 2015-02-02 DIAGNOSIS — Z992 Dependence on renal dialysis: Secondary | ICD-10-CM

## 2015-02-02 DIAGNOSIS — R042 Hemoptysis: Secondary | ICD-10-CM

## 2015-02-02 DIAGNOSIS — Z89612 Acquired absence of left leg above knee: Secondary | ICD-10-CM

## 2015-02-02 DIAGNOSIS — I10 Essential (primary) hypertension: Secondary | ICD-10-CM

## 2015-02-02 DIAGNOSIS — N186 End stage renal disease: Secondary | ICD-10-CM

## 2015-02-02 DIAGNOSIS — J810 Acute pulmonary edema: Secondary | ICD-10-CM | POA: Insufficient documentation

## 2015-02-02 LAB — CBC
HCT: 30.6 % — ABNORMAL LOW (ref 36.0–46.0)
Hemoglobin: 10.5 g/dL — ABNORMAL LOW (ref 12.0–15.0)
MCH: 28.8 pg (ref 26.0–34.0)
MCHC: 34.3 g/dL (ref 30.0–36.0)
MCV: 83.8 fL (ref 78.0–100.0)
PLATELETS: 393 10*3/uL (ref 150–400)
RBC: 3.65 MIL/uL — AB (ref 3.87–5.11)
RDW: 16 % — ABNORMAL HIGH (ref 11.5–15.5)
WBC: 15.1 10*3/uL — AB (ref 4.0–10.5)

## 2015-02-02 LAB — BASIC METABOLIC PANEL
Anion gap: 18 — ABNORMAL HIGH (ref 5–15)
BUN: 12 mg/dL (ref 6–20)
CO2: 27 mmol/L (ref 22–32)
Calcium: 9.1 mg/dL (ref 8.9–10.3)
Chloride: 93 mmol/L — ABNORMAL LOW (ref 101–111)
Creatinine, Ser: 4.61 mg/dL — ABNORMAL HIGH (ref 0.44–1.00)
GFR calc Af Amer: 12 mL/min — ABNORMAL LOW (ref >60)
GFR calc non Af Amer: 11 mL/min — ABNORMAL LOW (ref >60)
GLUCOSE: 242 mg/dL — AB (ref 65–99)
POTASSIUM: 4.1 mmol/L (ref 3.5–5.1)
Sodium: 138 mmol/L (ref 135–145)

## 2015-02-02 LAB — GLUCOSE, CAPILLARY
GLUCOSE-CAPILLARY: 257 mg/dL — AB (ref 65–99)
GLUCOSE-CAPILLARY: 236 mg/dL — AB (ref 65–99)
Glucose-Capillary: 288 mg/dL — ABNORMAL HIGH (ref 65–99)

## 2015-02-02 LAB — LIPID PANEL
Cholesterol: 139 mg/dL (ref 0–200)
HDL: 40 mg/dL — AB (ref >40)
LDL CALC: 78 mg/dL (ref 0–99)
Total CHOL/HDL Ratio: 3.5 RATIO
Triglycerides: 107 mg/dL (ref <150)
VLDL: 21 mg/dL (ref 0–40)

## 2015-02-02 LAB — HEMOGLOBIN A1C
HEMOGLOBIN A1C: 7.1 % — AB (ref 4.8–5.6)
MEAN PLASMA GLUCOSE: 157 mg/dL

## 2015-02-02 LAB — HEPATITIS B SURFACE ANTIGEN: Hepatitis B Surface Ag: NEGATIVE

## 2015-02-02 MED ORDER — DIPHENHYDRAMINE HCL 50 MG/ML IJ SOLN
25.0000 mg | Freq: Four times a day (QID) | INTRAMUSCULAR | Status: DC | PRN
  Administered 2015-02-02 – 2015-02-03 (×4): 25 mg via INTRAVENOUS
  Filled 2015-02-02 (×4): qty 1

## 2015-02-02 MED ORDER — DOCUSATE SODIUM 100 MG PO CAPS
100.0000 mg | ORAL_CAPSULE | Freq: Every day | ORAL | Status: DC
  Administered 2015-02-03: 100 mg via ORAL
  Filled 2015-02-02 (×2): qty 1

## 2015-02-02 MED ORDER — LIDOCAINE HCL (PF) 1 % IJ SOLN: 5.0000 mL | INTRAMUSCULAR | Status: DC | PRN

## 2015-02-02 MED ORDER — ATORVASTATIN CALCIUM 40 MG PO TABS: 40.0000 mg | ORAL_TABLET | Freq: Every day | ORAL | Status: DC

## 2015-02-02 MED ORDER — HEPARIN SODIUM (PORCINE) 1000 UNIT/ML DIALYSIS: 1500.0000 [IU] | INTRAMUSCULAR | Status: DC | PRN

## 2015-02-02 MED ORDER — METOPROLOL SUCCINATE ER 25 MG PO TB24
25.0000 mg | ORAL_TABLET | Freq: Every day | ORAL | Status: DC
Start: 2015-02-03 — End: 2015-02-03
  Administered 2015-02-03: 25 mg via ORAL
  Filled 2015-02-02: qty 1

## 2015-02-02 MED ORDER — ATROPINE SULFATE 0.1 MG/ML IJ SOLN
INTRAMUSCULAR | Status: AC
  Filled 2015-02-02: qty 10

## 2015-02-02 MED ORDER — DIPHENHYDRAMINE HCL 50 MG/ML IJ SOLN: 25.0000 mg | Freq: Four times a day (QID) | INTRAMUSCULAR | Status: DC | PRN

## 2015-02-02 MED ORDER — DIPHENHYDRAMINE HCL 25 MG PO CAPS: 25.0000 mg | ORAL_CAPSULE | Freq: Four times a day (QID) | ORAL | Status: DC | PRN

## 2015-02-02 MED ORDER — LIDOCAINE-PRILOCAINE 2.5-2.5 % EX CREA: 1.0000 "application " | TOPICAL_CREAM | CUTANEOUS | Status: DC | PRN

## 2015-02-02 MED ORDER — POLYETHYLENE GLYCOL 3350 17 G PO PACK
17.0000 g | PACK | Freq: Every day | ORAL | Status: DC | PRN
  Filled 2015-02-02: qty 1

## 2015-02-02 MED ORDER — PROMETHAZINE HCL 25 MG/ML IJ SOLN
25.0000 mg | Freq: Four times a day (QID) | INTRAMUSCULAR | Status: DC | PRN
  Administered 2015-02-02 – 2015-02-03 (×4): 25 mg via INTRAVENOUS
  Filled 2015-02-02 (×4): qty 1

## 2015-02-02 MED ORDER — ONDANSETRON HCL 4 MG/2ML IJ SOLN
INTRAMUSCULAR | Status: AC
  Administered 2015-02-02: 4 mg via INTRAVENOUS
  Filled 2015-02-02: qty 2

## 2015-02-02 MED ORDER — METOPROLOL TARTRATE 50 MG PO TABS: 50.0000 mg | ORAL_TABLET | Freq: Two times a day (BID) | ORAL | Status: DC

## 2015-02-02 MED ORDER — SODIUM CHLORIDE 0.9 % IV SOLN: 100.0000 mL | INTRAVENOUS | Status: DC | PRN

## 2015-02-02 MED ORDER — TICAGRELOR 90 MG PO TABS: 90.0000 mg | ORAL_TABLET | Freq: Two times a day (BID) | ORAL | Status: DC

## 2015-02-02 MED ORDER — PENTAFLUOROPROP-TETRAFLUOROETH EX AERO: 1.0000 "application " | INHALATION_SPRAY | CUTANEOUS | Status: DC | PRN

## 2015-02-02 MED ORDER — HYDROMORPHONE HCL 2 MG PO TABS
2.0000 mg | ORAL_TABLET | Freq: Once | ORAL | Status: AC
  Administered 2015-02-02: 2 mg via ORAL
  Filled 2015-02-02: qty 1

## 2015-02-02 MED ORDER — NEPRO/CARBSTEADY PO LIQD: 237.0000 mL | ORAL | Status: DC | PRN

## 2015-02-02 MED ORDER — HEPARIN SODIUM (PORCINE) 1000 UNIT/ML DIALYSIS: 1000.0000 [IU] | INTRAMUSCULAR | Status: DC | PRN

## 2015-02-02 MED ORDER — ALTEPLASE 2 MG IJ SOLR
2.0000 mg | Freq: Once | INTRAMUSCULAR | Status: DC | PRN
  Filled 2015-02-02: qty 2

## 2015-02-02 MED ORDER — ONDANSETRON HCL 4 MG/2ML IJ SOLN
4.0000 mg | Freq: Once | INTRAMUSCULAR | Status: AC
  Administered 2015-02-02: 4 mg via INTRAVENOUS

## 2015-02-02 MED ORDER — DIPHENHYDRAMINE HCL 25 MG PO CAPS
25.0000 mg | ORAL_CAPSULE | Freq: Four times a day (QID) | ORAL | Status: DC | PRN
  Filled 2015-02-02: qty 1

## 2015-02-02 MED ORDER — METOCLOPRAMIDE HCL 5 MG/ML IJ SOLN
10.0000 mg | Freq: Once | INTRAMUSCULAR | Status: AC
  Administered 2015-02-02: 10 mg via INTRAVENOUS
  Filled 2015-02-02: qty 2

## 2015-02-02 MED ORDER — ATORVASTATIN CALCIUM 40 MG PO TABS
40.0000 mg | ORAL_TABLET | Freq: Every day | ORAL | Status: DC
  Filled 2015-02-02 (×2): qty 1

## 2015-02-02 NOTE — Progress Notes (Signed)
[facility] Progress Note   [name] ZOX:[id number] DOB: 1971/01/11 DOA: 02/01/2015 PCP: Billee Cashing, MD  Brief narrative: [name] is a 44 y.o. female with end-stage renal disease, diabetes mellitus, bilateral AKA, who presented to the hospital after an episode of vomiting. She was found to have elevated troponin and underwent a cardiac cath later in the day. Cath revealed a 99% stenosis in proximal to mid circumflex which was stented with the DES stent.   Subjective: Had another episode of vomiting this morning. No abdominal pain she states that she had not eaten anything for 2 days and feels that she ate too much this morning for breakfast. Called by RN later to be notified that she is coughing up blood mixed with sputum.  Assessment/Plan: Principal Problem:   NSTEMI (non-ST elevated myocardial infarction) -Status post DES stent-will need Brilinta and aspirin 1 year -Lipitor increased to 40 mg daily-metoprolol 25 mg twice a day  Active Problems: Hemoptysis  - Secondary to pulmonary edema, ASA, Brilinta and Heparin for DVT prophylaxis -she is hypoxic and requiring 4 L of oxygen to maintain an oxygen saturation of 95% - CXR reveals diffuse pulm edema and developing effusions -Will undergo dialysis per nephrology today    ESRD (end stage renal disease) on dialysis -Receives dialysis Monday Wednesday Friday-was dialyzed last night after cardiac cath with removal of about 3 L of fluid - will be dialyzed again today as mentioned above    Diabetes mellitus Continue home dose of 70/30  Vomiting - PRN Zofran and Phenergan- abdomen soft and non-distended   Code Status: full code Family Communication:  Disposition Plan: dialysis today - follow resp status in SDU DVT prophylaxis: Heparin Consultants:Cardiology, nephrology Procedures: cardiac cath 6/3  Antibiotics: Anti-infectives    None      Objective: Filed Weights   02/01/15 1028 02/01/15 1955  02/01/15 2315  Weight: 74.2 kg (163 lb 9.3 oz) 73.2 kg (161 lb 6 oz) 69.9 kg (154 lb 1.6 oz)    Intake/Output Summary (Last 24 hours) at 02/02/15 1547 Last data filed at 02/02/15 1200  Gross per 24 hour  Intake 734.22 ml  Output   3340 ml  Net -2605.78 ml     Vitals Filed Vitals:   02/02/15 1200 02/02/15 1300 02/02/15 1400 02/02/15 1500  BP: 152/75 166/76 166/73 139/58  Pulse: 81 82 86 81  Temp:      TempSrc:      Resp: 36 23 17 18   Height:      Weight:      SpO2: 98% 98% 96% 97%    Exam:  General:  Pt is alert, not in acute distress  HEENT: No icterus, No thrush, oral mucosa moist  Cardiovascular: regular rate and rhythm, S1/S2 No murmur  Respiratory: faint b/l crackles- pulse ox in 70s on room air  Abdomen: Soft, +Bowel sounds, non tender, non distended, no guarding  MSK: No LE edema, cyanosis or clubbing  Data Reviewed: Basic Metabolic Panel:  Recent Labs Lab 02/01/15 0148 02/02/15 0320  NA 136 138  K 3.5 4.1  CL 93* 93*  CO2 29 27  GLUCOSE 330* 242*  BUN 14 12  CREATININE 5.83* 4.61*  CALCIUM 9.8 9.1   Liver Function Tests:  Recent Labs Lab 02/01/15 0148  AST 18  ALT 9*  ALKPHOS 65  BILITOT 0.5  PROT 8.2*  ALBUMIN 3.5    Recent Labs Lab 02/01/15 0148  LIPASE 19*   No results for input(s): AMMONIA in  the last 168 hours. CBC:  Recent Labs Lab 02/01/15 0148 02/02/15 0320  WBC 15.5* 15.1*  NEUTROABS 13.6*  --   HGB 11.5* 10.5*  HCT 34.7* 30.6*  MCV 86.3 83.8  PLT 282 393   Cardiac Enzymes:  Recent Labs Lab 02/01/15 0148 02/01/15 0521 02/01/15 1111 02/01/15 2040  TROPONINI 2.03* 2.22* 2.20* 3.44*   BNP (last 3 results) No results for input(s): BNP in the last 8760 hours.  ProBNP (last 3 results) No results for input(s): PROBNP in the last 8760 hours.  CBG:  Recent Labs Lab 02/01/15 0855 02/01/15 1111 02/01/15 2154 02/02/15 0839 02/02/15 1132  GLUCAP 152* 145* 168* 257* 236*    Recent Results (from the  past 240 hour(s))  MRSA PCR Screening     Status: None   Collection Time: 02/01/15 10:37 AM  Result Value Ref Range Status   MRSA by PCR NEGATIVE NEGATIVE Final    Comment:        The GeneXpert MRSA Assay (FDA approved for NASAL specimens only), is one component of a comprehensive MRSA colonization surveillance program. It is not intended to diagnose MRSA infection nor to guide or monitor treatment for MRSA infections.      Studies: Dg Chest Port 1 View  02/02/2015   CLINICAL DATA:  Follow-up pulmonary edema  EXAM: PORTABLE CHEST - 1 VIEW  COMPARISON:  05/19/2013; 04/21/2013 ; 06/27/2012  FINDINGS: Grossly unchanged enlarged cardiac silhouette and mediastinal contours. The pulmonary vasculature appears less distinct than present examination with cephalization of flow. Interval development of trace bilateral effusions with associated worsening bibasilar opacities, likely atelectasis. No pneumothorax. No acute osseus abnormalities.  IMPRESSION: Findings suggestive of worsening pulmonary edema with development of trace bilateral effusions and worsening bibasilar opacities, likely atelectasis.   Electronically Signed   By: Simonne Come M.D.   On: 02/02/2015 13:21    Scheduled Meds:  Scheduled Meds: . amLODipine  10 mg Oral QHS  . aspirin EC  81 mg Oral Daily  . atorvastatin  40 mg Oral q1800  . cinacalcet  90 mg Oral Q breakfast  . heparin  5,000 Units Subcutaneous 3 times per day  . insulin aspart protamine- aspart  10 Units Subcutaneous Q breakfast  . insulin aspart protamine- aspart  6 Units Subcutaneous Q supper  . insulin regular  0-10 Units Intravenous TID WC  . [START ON 02/03/2015] metoprolol succinate  25 mg Oral Daily  . pantoprazole  40 mg Oral Daily  . sevelamer carbonate  3,200 mg Oral BID WC  . sodium chloride  3 mL Intravenous Q12H  . sodium chloride  3 mL Intravenous Q12H  . ticagrelor  90 mg Oral BID   Continuous Infusions: . nitroGLYCERIN Stopped (02/02/15 0500)     Time spent on care of this patient: 35 min   Denese Mentink, MD 02/02/2015, 3:47 PM  LOS: 1 day   [facility] Office  [phone] Pager - Text Page per [email or web address] If 7PM-7AM, please contact night-coverage [email or web address]

## 2015-02-02 NOTE — Consult Note (Signed)
[facility] Renal Consultation Note  Indication for Consultation:  Management of ESRD/hemodialysis; anemia, hypertension/volume and secondary hyperparathyroidism  HPI: [name] is a 44 y.o. female with bilat AKA , ESRD, and HTN on HD MWF via L thigh AVG, on HD > 31yr.  Developed N/V after eating [employer], then got SOB and came to ED where trop was > 2, EKG showed lateral TWI and nonspec ST-T changes. Seen by cards started on asa / IV hep. Trop cont'd to rise so taken to cath lab. Specifics not available but cards note today says DES to LCX, one year of Brilinta and ASA. EF 55% by echo. Had flash pulm edema after cath and HD done last evenign with 3500 cc off and SOB is now resolved.   Does HD MWF, compliant w HD , doesn't miss.  No couhg, CP, SOB at this time.  No nv/d or abd pain. No fever , chills or sweats. Bilat AKA.     Past Medical History  Diagnosis Date  . GERD (gastroesophageal reflux disease)   . Diabetes mellitus     IDDM  . Hypertension     states has been on med. x "a long time"  . ESRF (end stage renal failure)     dialysis M,W, F; left thigh AV graft  . History of gangrene     left foot  . Hx MRSA infection   . 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 Pneumonia 04/20/2013    Past Surgical History  Procedure Laterality Date  . Av fistula placement  10/18/2008    right thigh AV graft  . Finger debridement  06/20/2010    right middle finger  . Finger amputation  05/27/2010    right middle  . Thrombectomy / arteriovenous graft revision  01/14/2010    left superficial femoral artery; left thigh AV graft placement  . 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 Leg amputation below knee  10/11/2009    right  . Central venous catheter insertion  08/06/2009    removal right IJ Diatek cath., placement left IJ Diatek cath.  . Above knee leg amputation  05/21/2009    left  . Foot amputation  04/04/2009    left transtibial amputation  .  Excision / curettage bone cyst talus / calcaneus  03/04/2009    partial calcaneal exc. left; placement wound VAC  . Groin debridement  11/16/2008    right rectus femoris muscle flap to right groin wound; right groing debridement  . Femoral endarterectomy  11/06/2008    right common femoral; embolectomy right femoral artery; removal right femoral AV graft  . Central venous catheter insertion  07/10/2008; 12/31/2006; 01/19/2006; 06/19/2005; 09/19/2004; 09/08/2004    right IJ Diatek catheter  . Av fistula repair  07/08/2008    removal left upper arm AV graft  . Thrombectomy / arteriovenous graft revision  05/11/2007;12/31/2006; 01/18/2006; 12/16/2005    left upper arm  . Dialysis fistula creation  01/20/2007    left upper arm AV graft  . Dialysis fistula creation  01/28/2006    right upper arm AV graft  . Thrombectomy / arteriovenous graft revision  10/26/2005    left forearm AV graft  . Dialysis fistula creation  10/04/2005    left forearm AV graft  . Av fistula repair  07/14/2005    removal infected right forearm AV graft  . Thrombectomy / arteriovenous graft revision  03/11/2005; 10/20/2004  right forearm AV graft  . Dialysis fistula creation  09/10/2004    right forearm AV graft  . Av fistula placement  07/18/2004    creation left AV fistula, radial to cephalic  . Finger exploration  07/13/2002    and repair left middle finger  . Carpal tunnel release  01/26/2012    Procedure: CARPAL TUNNEL RELEASE;  Surgeon: Wynonia Sours, MD;  Location: [facility];  Service: Orthopedics;  Laterality: Left;  . Esophagogastroduodenoscopy  06/26/2012    Procedure: ESOPHAGOGASTRODUODENOSCOPY (EGD);  Surgeon: Juanita Craver, MD;  Location: [facility] ENDOSCOPY;  Service: Endoscopy;  Laterality: N/A;  . Shuntogram N/A 11/12/2011    Procedure: Earney Mallet;  Surgeon: Conrad Chrisman, MD;  Location: [facility] CATH LAB;  Service: Cardiovascular;  Laterality: N/A;      Family History  Problem Relation Age of Onset  . Diabetes Mother    . Heart disease Mother   . Hypertension Mother   . Heart attack Mother 47  . Diabetes Sister   . Hypertension Brother   . Heart attack Brother       reports that she has never smoked. She has never used smokeless tobacco. She reports that she does not drink alcohol or use illicit drugs.   Allergies  Allergen Reactions  . Tramadol Itching  . Percocet [Oxycodone-Acetaminophen] Other (See Comments)    GI UPSET  . Soap Itching    IVORY SOAP    Prior to Admission medications   Medication Sig Start Date End Date Taking? Authorizing Provider  amLODipine (NORVASC) 10 MG tablet Take 10 mg by mouth at bedtime.   Yes Historical Provider, MD  aspirin 81 MG chewable tablet Chew 81 mg by mouth daily.   Yes Historical Provider, MD  cinacalcet (SENSIPAR) 90 MG tablet Take 90 mg by mouth daily.   Yes Historical Provider, MD  insulin aspart protamine-insulin aspart (NOVOLOG 70/30) (70-30) 100 UNIT/ML injection Inject 6-10 Units into the skin 2 (two) times daily with a meal. Take 10 units in the morning and 6 units in the evening   Yes Historical Provider, MD  omeprazole (PRILOSEC) 20 MG capsule Take 20 mg by mouth 2 (two) times daily.   Yes Historical Provider, MD  sevelamer (RENVELA) 800 MG tablet Take 3,200 mg by mouth 2 (two) times daily with a meal.    Yes Historical Provider, MD     Results for orders placed or performed during the hospital encounter of 02/01/15 (from the past 48 hour(s))  CBC with Differential     Status: Abnormal   Collection Time: 02/01/15  1:48 AM  Result Value Ref Range   WBC 15.5 (H) 4.0 - 10.5 K/uL   RBC 4.02 3.87 - 5.11 MIL/uL   Hemoglobin 11.5 (L) 12.0 - 15.0 g/dL   HCT 34.7 (L) 36.0 - 46.0 %   MCV 86.3 78.0 - 100.0 fL   MCH 28.6 26.0 - 34.0 pg   MCHC 33.1 30.0 - 36.0 g/dL   RDW 15.9 (H) 11.5 - 15.5 %   Platelets 282 150 - 400 K/uL   Neutrophils Relative % 88 (H) 43 - 77 %   Neutro Abs 13.6 (H) 1.7 - 7.7 K/uL   Lymphocytes Relative 7 (L) 12 - 46 %    Lymphs Abs 1.0 0.7 - 4.0 K/uL   Monocytes Relative 5 3 - 12 %   Monocytes Absolute 0.8 0.1 - 1.0 K/uL   Eosinophils Relative 0 0 - 5 %   Eosinophils Absolute 0.1  0.0 - 0.7 K/uL   Basophils Relative 0 0 - 1 %   Basophils Absolute 0.0 0.0 - 0.1 K/uL  Comprehensive metabolic panel     Status: Abnormal   Collection Time: 02/01/15  1:48 AM  Result Value Ref Range   Sodium 136 135 - 145 mmol/L   Potassium 3.5 3.5 - 5.1 mmol/L   Chloride 93 (L) 101 - 111 mmol/L   CO2 29 22 - 32 mmol/L   Glucose, Bld 330 (H) 65 - 99 mg/dL   BUN 14 6 - 20 mg/dL   Creatinine, Ser 5.83 (H) 0.44 - 1.00 mg/dL   Calcium 9.8 8.9 - 10.3 mg/dL   Total Protein 8.2 (H) 6.5 - 8.1 g/dL   Albumin 3.5 3.5 - 5.0 g/dL   AST 18 15 - 41 U/L   ALT 9 (L) 14 - 54 U/L   Alkaline Phosphatase 65 38 - 126 U/L   Total Bilirubin 0.5 0.3 - 1.2 mg/dL   GFR calc non Af Amer 8 (L) >60 mL/min   GFR calc Af Amer 9 (L) >60 mL/min    Comment: (NOTE) The eGFR has been calculated using the CKD EPI equation. This calculation has not been validated in all clinical situations. eGFR's persistently <60 mL/min signify possible Chronic Kidney Disease.    Anion gap 14 5 - 15  Lipase, blood     Status: Abnormal   Collection Time: 02/01/15  1:48 AM  Result Value Ref Range   Lipase 19 (L) 22 - 51 U/L  Troponin I     Status: Abnormal   Collection Time: 02/01/15  1:48 AM  Result Value Ref Range   Troponin I 2.03 (HH) <0.031 ng/mL    Comment:        POSSIBLE MYOCARDIAL ISCHEMIA. SERIAL TESTING RECOMMENDED. CRITICAL RESULT CALLED TO, READ BACK BY AND VERIFIED WITH: OLSEN,E RN 02/01/2015 0356 JORDANS REPEATED TO VERIFY   I-stat troponin, ED (only if pt is 45 y.o. or older & pain is above umbilicus)  not at [facility]     Status: Abnormal   Collection Time: 02/01/15  1:57 AM  Result Value Ref Range   Troponin i, poc 2.17 (HH) 0.00 - 0.08 ng/mL   Comment NOTIFIED PHYSICIAN    Comment 3            Comment: Due to the release kinetics of  cTnI, a negative result within the first hours of the onset of symptoms does not rule out myocardial infarction with certainty. If myocardial infarction is still suspected, repeat the test at appropriate intervals.   Troponin I     Status: Abnormal   Collection Time: 02/01/15  5:21 AM  Result Value Ref Range   Troponin I 2.22 (HH) <0.031 ng/mL    Comment:        POSSIBLE MYOCARDIAL ISCHEMIA. SERIAL TESTING RECOMMENDED. CRITICAL VALUE NOTED.  VALUE IS CONSISTENT WITH PREVIOUSLY REPORTED AND CALLED VALUE. REPEATED TO VERIFY   TSH     Status: None   Collection Time: 02/01/15  5:21 AM  Result Value Ref Range   TSH 2.358 0.350 - 4.500 uIU/mL  Hemoglobin A1c     Status: Abnormal   Collection Time: 02/01/15  5:21 AM  Result Value Ref Range   Hgb A1c MFr Bld 7.1 (H) 4.8 - 5.6 %    Comment: (NOTE)         Pre-diabetes: 5.7 - 6.4         Diabetes: >6.4  Glycemic control for adults with diabetes: <7.0    Mean Plasma Glucose 157 mg/dL    Comment: (NOTE) Performed At: [facility] [place], [place] [id number] Lindon Romp MD EX:[id number]   CBG monitoring, ED     Status: Abnormal   Collection Time: 02/01/15  5:47 AM  Result Value Ref Range   Glucose-Capillary 56 (L) 65 - 99 mg/dL  CBG monitoring, ED     Status: Abnormal   Collection Time: 02/01/15  5:48 AM  Result Value Ref Range   Glucose-Capillary 138 (H) 65 - 99 mg/dL  CBG monitoring, ED     Status: Abnormal   Collection Time: 02/01/15  8:55 AM  Result Value Ref Range   Glucose-Capillary 152 (H) 65 - 99 mg/dL  MRSA PCR Screening     Status: None   Collection Time: 02/01/15 10:37 AM  Result Value Ref Range   MRSA by PCR NEGATIVE NEGATIVE    Comment:        The GeneXpert MRSA Assay (FDA approved for NASAL specimens only), is one component of a comprehensive MRSA colonization surveillance program. It is not intended to diagnose MRSA infection nor to guide or monitor treatment for MRSA  infections.   Troponin I     Status: Abnormal   Collection Time: 02/01/15 11:11 AM  Result Value Ref Range   Troponin I 2.20 (HH) <0.031 ng/mL    Comment:        POSSIBLE MYOCARDIAL ISCHEMIA. SERIAL TESTING RECOMMENDED. REPEATED TO VERIFY CRITICAL VALUE NOTED.  VALUE IS CONSISTENT WITH PREVIOUSLY REPORTED AND CALLED VALUE.   Protime-INR     Status: None   Collection Time: 02/01/15 11:11 AM  Result Value Ref Range   Prothrombin Time 14.5 11.6 - 15.2 seconds   INR 1.11 0.00 - 1.49  Glucose, capillary     Status: Abnormal   Collection Time: 02/01/15 11:11 AM  Result Value Ref Range   Glucose-Capillary 145 (H) 65 - 99 mg/dL  Heparin level (unfractionated)     Status: Abnormal   Collection Time: 02/01/15  1:35 PM  Result Value Ref Range   Heparin Unfractionated 0.16 (L) 0.30 - 0.70 IU/mL    Comment:        IF HEPARIN RESULTS ARE BELOW EXPECTED VALUES, AND PATIENT DOSAGE HAS BEEN CONFIRMED, SUGGEST FOLLOW UP TESTING OF ANTITHROMBIN III LEVELS.   POCT Activated clotting time     Status: None   Collection Time: 02/01/15  5:20 PM  Result Value Ref Range   Activated Clotting Time 343 seconds  Troponin I     Status: Abnormal   Collection Time: 02/01/15  8:40 PM  Result Value Ref Range   Troponin I 3.44 (HH) <0.031 ng/mL    Comment:        POSSIBLE MYOCARDIAL ISCHEMIA. SERIAL TESTING RECOMMENDED. REPEATED TO VERIFY CRITICAL VALUE NOTED.  VALUE IS CONSISTENT WITH PREVIOUSLY REPORTED AND CALLED VALUE.   POCT Activated clotting time     Status: None   Collection Time: 02/01/15  8:43 PM  Result Value Ref Range   Activated Clotting Time 159 seconds  Glucose, capillary     Status: Abnormal   Collection Time: 02/01/15  9:54 PM  Result Value Ref Range   Glucose-Capillary 168 (H) 65 - 99 mg/dL  Hepatitis B surface antigen     Status: None   Collection Time: 02/02/15  1:00 AM  Result Value Ref Range   Hepatitis B Surface Ag Negative Negative    Comment: (  NOTE) Results  called/faxed to [facility] @ 5:14am 6.4.16 Performed At: [name] [place], [place] [id number] Lindon Romp MD KJ:[id number]   CBC     Status: Abnormal   Collection Time: 02/02/15  3:20 AM  Result Value Ref Range   WBC 15.1 (H) 4.0 - 10.5 K/uL   RBC 3.65 (L) 3.87 - 5.11 MIL/uL   Hemoglobin 10.5 (L) 12.0 - 15.0 g/dL   HCT 30.6 (L) 36.0 - 46.0 %   MCV 83.8 78.0 - 100.0 fL   MCH 28.8 26.0 - 34.0 pg   MCHC 34.3 30.0 - 36.0 g/dL   RDW 16.0 (H) 11.5 - 15.5 %   Platelets 393 150 - 400 K/uL  Basic metabolic panel     Status: Abnormal   Collection Time: 02/02/15  3:20 AM  Result Value Ref Range   Sodium 138 135 - 145 mmol/L   Potassium 4.1 3.5 - 5.1 mmol/L   Chloride 93 (L) 101 - 111 mmol/L   CO2 27 22 - 32 mmol/L   Glucose, Bld 242 (H) 65 - 99 mg/dL   BUN 12 6 - 20 mg/dL   Creatinine, Ser 4.61 (H) 0.44 - 1.00 mg/dL   Calcium 9.1 8.9 - 10.3 mg/dL   GFR calc non Af Amer 11 (L) >60 mL/min   GFR calc Af Amer 12 (L) >60 mL/min    Comment: (NOTE) The eGFR has been calculated using the CKD EPI equation. This calculation has not been validated in all clinical situations. eGFR's persistently <60 mL/min signify possible Chronic Kidney Disease.    Anion gap 18 (H) 5 - 15  Lipid panel     Status: Abnormal   Collection Time: 02/02/15  3:20 AM  Result Value Ref Range   Cholesterol 139 0 - 200 mg/dL   Triglycerides 107 <150 mg/dL   HDL 40 (L) >40 mg/dL   Total CHOL/HDL Ratio 3.5 RATIO   VLDL 21 0 - 40 mg/dL   LDL Cholesterol 78 0 - 99 mg/dL    Comment:        Total Cholesterol/HDL:CHD Risk Coronary Heart Disease Risk Table                     Men   Women  1/2 Average Risk   3.4   3.3  Average Risk       5.0   4.4  2 X Average Risk   9.6   7.1  3 X Average Risk  23.4   11.0        Use the calculated Patient Ratio above and the CHD Risk Table to determine the patient's CHD Risk.        ATP III CLASSIFICATION (LDL):  <100     mg/dL   Optimal   100-129  mg/dL   Near or Above                    Optimal  130-159  mg/dL   Borderline  160-189  mg/dL   High  >190     mg/dL   Very High   Glucose, capillary     Status: Abnormal   Collection Time: 02/02/15  8:39 AM  Result Value Ref Range   Glucose-Capillary 257 (H) 65 - 99 mg/dL   Physical Exam: Filed Vitals:   02/02/15 0902  BP: 167/74  Pulse: 90  Temp: 97.7 F (36.5 C)  Resp:      Exam Alert, pelasant  vibrant No jvd Chest clear bilat RRR no mrg Abd soft, ntnd +bs bilat AKA L thigh AVG patent Neuro is ox 3, nf  HD: [facility] MWF 4h   69.5kg   2/2 bath    Hep 7000   L fem AVG Mircera 75  last 01/23/15  Calcitrtiol 2.0 ug Other op lab hgb 11.4 ,23 %tfs/ Ca 10.0 phos 7.0  pth 678   Assessment: 1. SOB/ NSTEMI - s/p cath with DES to LCX lastnight 2. Pulm edema - improved clinically after HD last night and after stenting. Is at dry wt now 3. HTN on norvasc at home 4. Vol at dry wt 5. 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 Anemia cont esa as needed  Plan - check CXR, follow clinically. No need for additional HD today that I can see. If wet by CXR though will plan another HD today.   Kelly Splinter MD (pgr) [phone]    ([id number] 02/02/2015, 11:21 AM

## 2015-02-02 NOTE — Progress Notes (Signed)
Cardiac rehab phase 1400-1430 Patient is very nauseated this afternoon and not feeling well, RN aware. Patient  and sister given MI booklet, Stent card and heart healthy diet information. No exercise instructions given to patient due to physical limitations, b/l AKA.

## 2015-02-02 NOTE — Progress Notes (Addendum)
Subjective:  No Chest pain, no SOB. Doing well. Family in room.   Objective:  Vital Signs in the last 24 hours: Temp:  [97.7 F (36.5 C)-98.8 F (37.1 C)] 97.7 F (36.5 C) (06/04 0902) Pulse Rate:  [0-104] 90 (06/04 0902) Resp:  [0-31] 17 (06/04 0800) BP: (102-184)/(48-108) 167/74 mmHg (06/04 0902) SpO2:  [0 %-100 %] 94 % (06/04 0902) Arterial Line BP: (126-174)/(68-83) 157/77 mmHg (06/04 0125) FiO2 (%):  [100 %] 100 % (06/03 2200) Weight:  [154 lb 1.6 oz (69.9 kg)-163 lb 9.3 oz (74.2 kg)] 154 lb 1.6 oz (69.9 kg) (06/03 2315)  Intake/Output from previous day: 06/03 0701 - 06/04 0700 In: 462.5 [P.O.:200; I.V.:262.5] Out: 3340    Physical Exam: General: Well developed, well nourished, in no acute distress. Head:  Normocephalic and atraumatic. Lungs: Clear to auscultation and percussion. Heart: Normal S1 and S2.  No murmur, rubs or gallops.  Abdomen: soft, non-tender, positive bowel sounds. Extremities: Bilat AKA. Neurologic: Alert and oriented x 3.    Lab Results:  Recent Labs  02/01/15 0148 02/02/15 0320  WBC 15.5* 15.1*  HGB 11.5* 10.5*  PLT 282 393    Recent Labs  02/01/15 0148 02/02/15 0320  NA 136 138  K 3.5 4.1  CL 93* 93*  CO2 29 27  GLUCOSE 330* 242*  BUN 14 12  CREATININE 5.83* 4.61*    Recent Labs  02/01/15 1111 02/01/15 2040  TROPONINI 2.20* 3.44*   Hepatic Function Panel  Recent Labs  02/01/15 0148  PROT 8.2*  ALBUMIN 3.5  AST 18  ALT 9*  ALKPHOS 65  BILITOT 0.5    Recent Labs  02/02/15 0320  CHOL 139   Telemetry: no adverse rhtyhms Personally viewed.   EKG:  NSR with NSSTW changes  Cardiac Studies:  Cath:   Prox Cx lesion, 30% stenosed.  Prox Cx to Mid Cx lesion, 99% stenosed. There is a 0% residual stenosis post intervention.  A drug-eluting stent was placed.  Elevated left ventricular end-diastolic pressure 36 mm. Left ventriculography was not performed.  Single vessel coronary obstructive disease with  30-40% proximal left circumflex stenosis followed by 99% proximal to mid circumflex stenosis prior to a sharp proximal bend in the vessel.  Successful PCI of the 99% left circumflex stenosis with insertion of 2.515 mm Xience Alpine DES stent postdilated to 2.71 mm with the stenosis reduced to 0%.   RECOMMENDATION:  The patient should be continued with dual platelet therapy for minimum of a year. She will undergo dialysis this evening, particularly with her marked elevation of left ventricular end-diastolic pressure.   ECHO: 02/01/15  - Left ventricle: Severe hypokinesis of the inferolateral wall. The other walls are vigorous. EF is 55%. The cavity size was normal. Wall thickness was increased in a pattern of mild LVH. - Left atrium: The atrium was mildly dilated. - Right ventricle: The cavity size was mildly dilated. Systolic function was mildly reduced. - Pulmonary arteries: PA peak pressure: 65 mm Hg (S). - Pericardium, extracardiac: A trivial pericardial effusion was identified posterior to the heart.  Scheduled Meds: . amLODipine  10 mg Oral QHS  . aspirin EC  81 mg Oral Daily  . atorvastatin  10 mg Oral q1800  . cinacalcet  90 mg Oral Q breakfast  . heparin  5,000 Units Subcutaneous 3 times per day  . insulin aspart protamine- aspart  10 Units Subcutaneous Q breakfast  . insulin aspart protamine- aspart  6 Units Subcutaneous Q supper  .  insulin regular  0-10 Units Intravenous TID WC  . metoprolol succinate  12.5 mg Oral Daily  . pantoprazole  40 mg Oral Daily  . sevelamer carbonate  3,200 mg Oral BID WC  . sodium chloride  3 mL Intravenous Q12H  . sodium chloride  3 mL Intravenous Q12H  . ticagrelor  90 mg Oral BID   Continuous Infusions: . sodium chloride Stopped (02/01/15 1000)  . nitroGLYCERIN Stopped (02/02/15 0500)   PRN Meds:.sodium chloride, sodium chloride, sodium chloride, sodium chloride, acetaminophen, acetaminophen, dextrose, feeding supplement  (NEPRO CARB STEADY), heparin, heparin, lidocaine (PF), lidocaine-prilocaine, nitroGLYCERIN, ondansetron (ZOFRAN) IV, pentafluoroprop-tetrafluoroeth, sodium chloride, sodium chloride  Assessment/Plan:  Principal Problem:   Elevated troponin Active Problems:   ESRD (end stage renal disease) on dialysis   Diabetes mellitus   NSTEMI (non-ST elevated myocardial infarction)  NSTEMI  - Circ DES  - no angina  - DAPT one year (Brilinta ASA).   - EF 55% on ECHO  - Will increase atorvastatin to  (high intensity dose) from .   - Bb, metoprolol low dose. Will increase to 25 QD. BP elevated.  - Trop 3.4  - HD to lower LVEDP (elevated LA filling pressures)  Pulmonary hypertension  - in part secondary from elevation LA filling pressures  - Treat BP, fluid status  Essential hypertension  - elevated, increased Bb  ESRD on HD  - per renal  PVD  Comfortable with DC from cardiac perspective. Will have cardiology follow up (message sent to scheduler).    [name] 02/02/2015, 9:03 AM

## 2015-02-02 NOTE — Progress Notes (Signed)
Pt completed HD treatment. Signed off 50 minutes early due to nausea. Report called and patient returned safely to room.

## 2015-02-03 ENCOUNTER — Inpatient Hospital Stay (HOSPITAL_COMMUNITY): Payer: Medicare Other

## 2015-02-03 LAB — CBC
HCT: 32 % — ABNORMAL LOW (ref 36.0–46.0)
HEMOGLOBIN: 10.8 g/dL — AB (ref 12.0–15.0)
MCH: 28.3 pg (ref 26.0–34.0)
MCHC: 33.8 g/dL (ref 30.0–36.0)
MCV: 84 fL (ref 78.0–100.0)
Platelets: 316 10*3/uL (ref 150–400)
RBC: 3.81 MIL/uL — AB (ref 3.87–5.11)
RDW: 15.9 % — AB (ref 11.5–15.5)
WBC: 14.5 10*3/uL — ABNORMAL HIGH (ref 4.0–10.5)

## 2015-02-03 LAB — GLUCOSE, CAPILLARY
GLUCOSE-CAPILLARY: 373 mg/dL — AB (ref 65–99)
GLUCOSE-CAPILLARY: 329 mg/dL — AB (ref 65–99)

## 2015-02-03 MED ORDER — METOCLOPRAMIDE HCL 5 MG/ML IJ SOLN
10.0000 mg | Freq: Four times a day (QID) | INTRAMUSCULAR | Status: DC
  Administered 2015-02-03: 10 mg via INTRAVENOUS
  Filled 2015-02-03: qty 2

## 2015-02-03 MED ORDER — INSULIN ASPART 100 UNIT/ML ~~LOC~~ SOLN
0.0000 [IU] | Freq: Three times a day (TID) | SUBCUTANEOUS | Status: DC
  Administered 2015-02-03: 11 [IU] via SUBCUTANEOUS

## 2015-02-03 MED ORDER — METOCLOPRAMIDE HCL 5 MG/5ML PO SOLN: 10.0000 mg | Freq: Four times a day (QID) | ORAL | Status: DC | PRN

## 2015-02-03 MED ORDER — INSULIN GLARGINE 100 UNIT/ML ~~LOC~~ SOLN
15.0000 [IU] | Freq: Every day | SUBCUTANEOUS | Status: DC
  Filled 2015-02-03: qty 0.15

## 2015-02-03 MED ORDER — HYDRALAZINE HCL 50 MG PO TABS: 50.0000 mg | ORAL_TABLET | Freq: Three times a day (TID) | ORAL | Status: DC

## 2015-02-03 MED ORDER — METOPROLOL SUCCINATE ER 50 MG PO TB24: 50.0000 mg | ORAL_TABLET | Freq: Every day | ORAL | Status: DC

## 2015-02-03 MED ORDER — INSULIN ASPART 100 UNIT/ML ~~LOC~~ SOLN: 0.0000 [IU] | Freq: Every day | SUBCUTANEOUS | Status: DC

## 2015-02-03 MED ORDER — HYDRALAZINE HCL 50 MG PO TABS
50.0000 mg | ORAL_TABLET | Freq: Three times a day (TID) | ORAL | Status: DC
  Administered 2015-02-03: 50 mg via ORAL
  Filled 2015-02-03 (×3): qty 1

## 2015-02-03 MED ORDER — METOPROLOL SUCCINATE ER 25 MG PO TB24
25.0000 mg | ORAL_TABLET | Freq: Once | ORAL | Status: AC
  Administered 2015-02-03: 25 mg via ORAL
  Filled 2015-02-03: qty 1

## 2015-02-03 NOTE — Progress Notes (Signed)
02/03/2015 9:33 AM  Report received. Room is ready for patient.   PACCAR Incyanne Hill BSN, RN-BC, [employer] [facility] Phone [phone]

## 2015-02-03 NOTE — Progress Notes (Signed)
Subjective:  HD yest. With hemoptysis / better this am 100% rm O2 sat no cp or sob  Objective Vital signs in last 24 hours: Filed Vitals:   02/03/15 0800 02/03/15 0805 02/03/15 0913 02/03/15 1013  BP: 155/95 155/95 155/95 169/84  Pulse: 91 90 89 82  Temp:  98.3 F (36.8 C)  98.7 F (37.1 C)  TempSrc:  Oral  Oral  Resp: 19 17  20   Height:      Weight:      SpO2: 93% 90%  100%   Weight change: -0.2 kg (-7.1 oz)  Physical Exam: Gen: alert Obese BF awoken from sleep NAD ox3  Heart: RRR no mur, rub or gallop Lungs: CTA non labored breathing  Abdomen: obese, bs pos. Tender epigastric area Extremities: bilat AKA's Dialysis Access: pos bruit L fem avgg   HD: [facility] MWF 4h 69.5kg 2/2 bath Hep 7000 L fem AVG Mircera 75 last 01/23/15 Calcitrtiol 2.0 ug Other op lab hgb 11.4 ,23 %tfs/ Ca 10.0 phos 7.0 pth 678   Problem/Plan: 1. SOB/ NSTEMI - s/p cath with DES to LCX lastnight 2. Pulm edema - improved clinically after HD last night and after stenting with card cath . 100% rm  o2 sat  3. HTN -on norvasc at home/ Hydralazine 50 mg tid and meto 50 mg bid added need to monitor bp closely with hd to avoid bp drop 4. Vol -at dry wt / today  Cxr showing improving pulm edema/ hd uf in am  Lower edw at dc?? 5. 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 Anemia -cont esa as needed 8. DM type 2 -per admit   Mary Pastelavid Zeyfang, PA-C [facility] [phone] 02/03/2015,12:32 PM  LOS: 2 days   Pt seen, examined and agree w A/P as above. Suspect her pulm edema was ischemic in origin which doesn't usually respond to UF with dialysis.  Xray better today and is now off O2 with RA sat 99%. Wants to go home. Kept lunch down.  Will d/w primary. OK from renal standpoint.  Vinson Moselleob Jakel Alphin MD pager [phone]    cell [phone] 02/03/2015, 4:23 PM    Labs: Basic  Metabolic Panel:  Recent Labs Lab 02/01/15 0148 02/02/15 0320  NA 136 138  K 3.5 4.1  CL 93* 93*  CO2 29 27  GLUCOSE 330* 242*  BUN 14 12  CREATININE 5.83* 4.61*  CALCIUM 9.8 9.1   Liver Function Tests:  Recent Labs Lab 02/01/15 0148  AST 18  ALT 9*  ALKPHOS 65  BILITOT 0.5  PROT 8.2*  ALBUMIN 3.5    Recent Labs Lab 02/01/15 0148  LIPASE 19*     Recent Labs Lab 02/01/15 0148 02/02/15 0320 02/03/15 0706  WBC 15.5* 15.1* 14.5*  NEUTROABS 13.6*  --   --   HGB 11.5* 10.5* 10.8*  HCT 34.7* 30.6* 32.0*  MCV 86.3 83.8 84.0  PLT 282 393 316   Cardiac Enzymes:  Recent Labs Lab 02/01/15 0148 02/01/15 0521 02/01/15 1111 02/01/15 2040  TROPONINI 2.03* 2.22* 2.20* 3.44*   CBG:  Recent Labs Lab 02/02/15 0839 02/02/15 1132 02/02/15 2008 02/03/15 0802 02/03/15 1207  GLUCAP 257* 236* 288* 373* 329*    Studies/Results: Dg Chest Port 1 View  02/03/2015   CLINICAL DATA:  Hypoxia.  EXAM: PORTABLE CHEST - 1 VIEW  COMPARISON:  One-view  chest 02/02/2015.  FINDINGS: The heart is mildly enlarged. This is exaggerated by low lung volumes. Aeration is improving. Persistent bibasilar airspace disease is noted, left greater than right.  IMPRESSION: 1. Left greater than right basilar airspace disease is improving. While this may represent atelectasis, infection is also considered. 2. Decreasing interstitial edema.   Electronically Signed   By: Marin Roberts M.D.   On: 02/03/2015 09:24   Dg Chest Port 1 View  02/02/2015   CLINICAL DATA:  Follow-up pulmonary edema  EXAM: PORTABLE CHEST - 1 VIEW  COMPARISON:  05/19/2013; 04/21/2013 ; 06/27/2012  FINDINGS: Grossly unchanged enlarged cardiac silhouette and mediastinal contours. The pulmonary vasculature appears less distinct than present examination with cephalization of flow. Interval development of trace bilateral effusions with associated worsening bibasilar opacities, likely atelectasis. No pneumothorax. No acute osseus  abnormalities.  IMPRESSION: Findings suggestive of worsening pulmonary edema with development of trace bilateral effusions and worsening bibasilar opacities, likely atelectasis.   Electronically Signed   By: Simonne Come M.D.   On: 02/02/2015 13:21   Dg Abd Portable 1v  02/03/2015   CLINICAL DATA:  Right-sided abdominal pain.  Nausea and vomiting.  EXAM: PORTABLE ABDOMEN - 1 VIEW  COMPARISON:  CT of the abdomen and pelvis 06/23/2012.  FINDINGS: A relatively gasless abdomen is present. There is no obstruction or free air. Atherosclerotic calcifications are again noted within the bones Derek arteries. Calcified uterine fibroids are evident. The axial skeleton is unremarkable. The heart is mildly enlarged. Lung bases are clear.  IMPRESSION: 1. Nonspecific bowel gas pattern without evidence for obstruction or free air. There is a relative paucity of gas in the abdomen, a nonspecific finding. 2. Atherosclerosis. 3. Borderline cardiomegaly without failure.   Electronically Signed   By: Marin Roberts M.D.   On: 02/03/2015 09:31   Medications: . nitroGLYCERIN Stopped (02/02/15 0500)   . amLODipine  10 mg Oral QHS  . aspirin EC  81 mg Oral Daily  . atorvastatin  40 mg Oral q1800  . cinacalcet  90 mg Oral Q breakfast  . docusate sodium  100 mg Oral Daily  . heparin  5,000 Units Subcutaneous 3 times per day  . hydrALAZINE  50 mg Oral 3 times per day  . insulin aspart  0-15 Units Subcutaneous TID WC  . insulin aspart  0-5 Units Subcutaneous QHS  . insulin glargine  15 Units Subcutaneous QHS  . insulin regular  0-10 Units Intravenous TID WC  . metoCLOPramide (REGLAN) injection  10 mg Intravenous 4 times per day  . [START ON 02/04/2015] metoprolol succinate  50 mg Oral Daily  . pantoprazole  40 mg Oral Daily  . sevelamer carbonate  3,200 mg Oral BID WC  . sodium chloride  3 mL Intravenous Q12H  . sodium chloride  3 mL Intravenous Q12H  . ticagrelor  90 mg Oral BID

## 2015-02-03 NOTE — Progress Notes (Signed)
Subjective:  No Chest pain, no SOB. Sleepy. Nausea post HD yesterday   Objective:  Vital Signs in the last 24 hours: Temp:  [97.5 F (36.4 C)-98.3 F (36.8 C)] 98.3 F (36.8 C) (06/05 0805) Pulse Rate:  [73-97] 89 (06/05 0913) Resp:  [11-36] 17 (06/05 0805) BP: (123-185)/(58-124) 155/95 mmHg (06/05 0913) SpO2:  [90 %-100 %] 90 % (06/05 0805) Weight:  [160 lb 15 oz (73 kg)-163 lb 2.3 oz (74 kg)] 160 lb 15 oz (73 kg) (06/04 1800)  Intake/Output from previous day: 06/04 0701 - 06/05 0700 In: 440 [P.O.:420; I.V.:20] Out: 1066 [Stool:2]   Physical Exam: General: Well developed, well nourished, in no acute distress. Head:  Normocephalic and atraumatic. Lungs: Clear to auscultation and percussion. Heart: Normal S1 and S2.  No murmur, rubs or gallops.  Abdomen: soft, non-tender, positive bowel sounds. Extremities: Bilat AKA. Neurologic: sleepy.    Lab Results:  Recent Labs  02/02/15 0320 02/03/15 0706  WBC 15.1* 14.5*  HGB 10.5* 10.8*  PLT 393 316    Recent Labs  02/01/15 0148 02/02/15 0320  NA 136 138  K 3.5 4.1  CL 93* 93*  CO2 29 27  GLUCOSE 330* 242*  BUN 14 12  CREATININE 5.83* 4.61*    Recent Labs  02/01/15 1111 02/01/15 2040  TROPONINI 2.20* 3.44*   Hepatic Function Panel  Recent Labs  02/01/15 0148  PROT 8.2*  ALBUMIN 3.5  AST 18  ALT 9*  ALKPHOS 65  BILITOT 0.5    Recent Labs  02/02/15 0320  CHOL 139   Telemetry: no adverse rhtyhms Personally viewed.   EKG:  NSR with NSSTW changes  Cardiac Studies:  Cath:   Prox Cx lesion, 30% stenosed.  Prox Cx to Mid Cx lesion, 99% stenosed. There is a 0% residual stenosis post intervention.  A drug-eluting stent was placed.  Elevated left ventricular end-diastolic pressure 36 mm. Left ventriculography was not performed.  Single vessel coronary obstructive disease with 30-40% proximal left circumflex stenosis followed by 99% proximal to mid circumflex stenosis prior to a sharp  proximal bend in the vessel.  Successful PCI of the 99% left circumflex stenosis with insertion of 2.515 mm Xience Alpine DES stent postdilated to 2.71 mm with the stenosis reduced to 0%.   RECOMMENDATION:  The patient should be continued with dual platelet therapy for minimum of a year. She will undergo dialysis this evening, particularly with her marked elevation of left ventricular end-diastolic pressure.   ECHO: 02/01/15  - Left ventricle: Severe hypokinesis of the inferolateral wall. The other walls are vigorous. EF is 55%. The cavity size was normal. Wall thickness was increased in a pattern of mild LVH. - Left atrium: The atrium was mildly dilated. - Right ventricle: The cavity size was mildly dilated. Systolic function was mildly reduced. - Pulmonary arteries: PA peak pressure: 65 mm Hg (S). - Pericardium, extracardiac: A trivial pericardial effusion was identified posterior to the heart.  Scheduled Meds: . amLODipine  10 mg Oral QHS  . aspirin EC  81 mg Oral Daily  . atorvastatin  40 mg Oral q1800  . cinacalcet  90 mg Oral Q breakfast  . docusate sodium  100 mg Oral Daily  . heparin  5,000 Units Subcutaneous 3 times per day  . insulin aspart protamine- aspart  10 Units Subcutaneous Q breakfast  . insulin aspart protamine- aspart  6 Units Subcutaneous Q supper  . insulin regular  0-10 Units Intravenous TID WC  .  metoprolol succinate  25 mg Oral Daily  . pantoprazole  40 mg Oral Daily  . sevelamer carbonate  3,200 mg Oral BID WC  . sodium chloride  3 mL Intravenous Q12H  . sodium chloride  3 mL Intravenous Q12H  . ticagrelor  90 mg Oral BID   Continuous Infusions: . nitroGLYCERIN Stopped (02/02/15 0500)   PRN Meds:.sodium chloride, sodium chloride, acetaminophen, dextrose, diphenhydrAMINE **OR** diphenhydrAMINE, nitroGLYCERIN, ondansetron (ZOFRAN) IV, polyethylene glycol, promethazine, sodium chloride, sodium chloride  Assessment/Plan:  Principal Problem:    NSTEMI (non-ST elevated myocardial infarction) Active Problems:   ESRD (end stage renal disease) on dialysis   Diabetes mellitus   Acute pulmonary edema   Hemoptysis  NSTEMI  - Circ DES  - no angina  - DAPT one year (Brilinta ASA). Watch for bleeding  - EF 55% on ECHO  - atorvastatin to 40mg  (high intensity dose) from 10mg .   -  Metop 25 QD. BP elevated.  - Trop 3.4  - HD to lower LVEDP (elevated LA filling pressures)  Pulmonary hypertension  - in part secondary from elevation LA filling pressures  - Treat BP, fluid status  Essential hypertension  - elevated, increased Bb  ESRD on HD  - per renal  PVD  Comfortable with DC from cardiac perspective. Will have cardiology follow up (message sent to scheduler).  Will sign off.   Please let us know if we can be of further assistance.    Hamish Banks 02/03/2015, 9:29 AM

## 2015-02-03 NOTE — Progress Notes (Deleted)
[facility] Progress Note   [name] YQI:[id number] DOB: [birth year] DOA: 02/01/2015 PCP: Billee Cashing, MD  Brief narrative: [name] is a 44 y.o. female with end-stage renal disease, diabetes mellitus, bilateral AKA, who presented to the hospital after an episode of vomiting. She was found to have elevated troponin and underwent a cardiac cath later in the day. Cath revealed a 99% stenosis in proximal to mid circumflex which was stented with the DES stent.   Subjective: Still nauseated this AM with a small amount of vomiting. No abdominal pain or diarrhea. No chest pain or shortness of breath. Hemoptysis is improving but has not resolved completely.   Assessment/Plan: Principal Problem:   NSTEMI (non-ST elevated myocardial infarction) -Status post DES stent-will need Brilinta and aspirin 1 year -Lipitor increased to 40 mg daily-metoprolol 25 mg twice a day started yesterday- will increase to 50   Active Problems:  Hemoptysis  - Secondary to pulmonary edema, ASA, Brilinta and Heparin for DVT prophylaxis -hypoxia has improved after dialysis yesterday and she now has a pulse ox of 95% on room air- yesterday is was 95% on 4 L - hemoptysis is not as much as yesterday - ECHO 6/3- EF 55% mild LVH - CXR on 6/4 revealed diffuse pulm edema and developing effusions- today it looks better but she continues to have infiltrate vs atelectasis at bases  - - may need to be dialyzed again today- if Hemoptysis does not resolve by tomorrow, will obtain CT chest   Vomiting - abd xray unrevealing - normal BMs yesterday - cont anti emetics and PPI - start Reglan every 6 hrs routine -  Still no focal tenderness on exam  HTN - increase Toprol to 50 mg, add Hydralazine 50 TID - cont Amlodipine    ESRD (end stage renal disease) on dialysis -Receives dialysis Monday Wednesday Friday-was dialyzed last night after cardiac cath with removal of about 3 L of fluid - dialyzed again on  6/4 with 1 L taken off    Diabetes mellitus - A1c 7/1 on 6/3 -sugars quite elevated on home dose of 70/30- as she is not eating much due to vomiting - will d/c 70/30 and switch to Lantus and sliding scale with meals  Code Status: full code Family Communication:  Disposition Plan: transfer to tele DVT prophylaxis: Heparin Consultants:Cardiology, nephrology Procedures: cardiac cath 6/3  Antibiotics: Anti-infectives    None      Objective: Filed Weights   02/01/15 2315 02/02/15 1555 02/02/15 1800  Weight: 69.9 kg (154 lb 1.6 oz) 74 kg (163 lb 2.3 oz) 73 kg (160 lb 15 oz)    Intake/Output Summary (Last 24 hours) at 02/03/15 1018 Last data filed at 02/03/15 0600  Gross per 24 hour  Intake    200 ml  Output   1066 ml  Net   -866 ml     Vitals Filed Vitals:   02/03/15 0800 02/03/15 0805 02/03/15 0913 02/03/15 1013  BP: 155/95 155/95 155/95 169/84  Pulse: 91 90 89 82  Temp:  98.3 F (36.8 C)  98.7 F (37.1 C)  TempSrc:  Oral  Oral  Resp: 19 17  20   Height:      Weight:      SpO2: 93% 90%  100%    Exam:  General:  Pt is alert, not in acute distress  HEENT: No icterus, No thrush, oral mucosa moist  Cardiovascular: regular rate and rhythm, S1/S2 No murmur  Respiratory: CTA b/l   Abdomen:  Soft, +Bowel sounds, non tender, non distended, no guarding  MSK: No LE edema, cyanosis or clubbing  Data Reviewed: Basic Metabolic Panel:  Recent Labs Lab 02/01/15 0148 02/02/15 0320  NA 136 138  K 3.5 4.1  CL 93* 93*  CO2 29 27  GLUCOSE 330* 242*  BUN 14 12  CREATININE 5.83* 4.61*  CALCIUM 9.8 9.1   Liver Function Tests:  Recent Labs Lab 02/01/15 0148  AST 18  ALT 9*  ALKPHOS 65  BILITOT 0.5  PROT 8.2*  ALBUMIN 3.5    Recent Labs Lab 02/01/15 0148  LIPASE 19*   No results for input(s): AMMONIA in the last 168 hours. CBC:  Recent Labs Lab 02/01/15 0148 02/02/15 0320 02/03/15 0706  WBC 15.5* 15.1* 14.5*  NEUTROABS 13.6*  --   --   HGB  11.5* 10.5* 10.8*  HCT 34.7* 30.6* 32.0*  MCV 86.3 83.8 84.0  PLT 282 393 316   Cardiac Enzymes:  Recent Labs Lab 02/01/15 0148 02/01/15 0521 02/01/15 1111 02/01/15 2040  TROPONINI 2.03* 2.22* 2.20* 3.44*   BNP (last 3 results) No results for input(s): BNP in the last 8760 hours.  ProBNP (last 3 results) No results for input(s): PROBNP in the last 8760 hours.  CBG:  Recent Labs Lab 02/01/15 2154 02/02/15 0839 02/02/15 1132 02/02/15 2008 02/03/15 0802  GLUCAP 168* 257* 236* 288* 373*    Recent Results (from the past 240 hour(s))  MRSA PCR Screening     Status: None   Collection Time: 02/01/15 10:37 AM  Result Value Ref Range Status   MRSA by PCR NEGATIVE NEGATIVE Final    Comment:        The GeneXpert MRSA Assay (FDA approved for NASAL specimens only), is one component of a comprehensive MRSA colonization surveillance program. It is not intended to diagnose MRSA infection nor to guide or monitor treatment for MRSA infections.      Studies: Dg Chest Port 1 View  02/03/2015   CLINICAL DATA:  Hypoxia.  EXAM: PORTABLE CHEST - 1 VIEW  COMPARISON:  One-view chest 02/02/2015.  FINDINGS: The heart is mildly enlarged. This is exaggerated by low lung volumes. Aeration is improving. Persistent bibasilar airspace disease is noted, left greater than right.  IMPRESSION: 1. Left greater than right basilar airspace disease is improving. While this may represent atelectasis, infection is also considered. 2. Decreasing interstitial edema.   Electronically Signed   By: Marin Roberts M.D.   On: 02/03/2015 09:24   Dg Chest Port 1 View  02/02/2015   CLINICAL DATA:  Follow-up pulmonary edema  EXAM: PORTABLE CHEST - 1 VIEW  COMPARISON:  05/19/2013; 04/21/2013 ; 06/27/2012  FINDINGS: Grossly unchanged enlarged cardiac silhouette and mediastinal contours. The pulmonary vasculature appears less distinct than present examination with cephalization of flow. Interval development of  trace bilateral effusions with associated worsening bibasilar opacities, likely atelectasis. No pneumothorax. No acute osseus abnormalities.  IMPRESSION: Findings suggestive of worsening pulmonary edema with development of trace bilateral effusions and worsening bibasilar opacities, likely atelectasis.   Electronically Signed   By: Simonne Come M.D.   On: 02/02/2015 13:21   Dg Abd Portable 1v  02/03/2015   CLINICAL DATA:  Right-sided abdominal pain.  Nausea and vomiting.  EXAM: PORTABLE ABDOMEN - 1 VIEW  COMPARISON:  CT of the abdomen and pelvis 06/23/2012.  FINDINGS: A relatively gasless abdomen is present. There is no obstruction or free air. Atherosclerotic calcifications are again noted within the bones Derek arteries. Calcified uterine  fibroids are evident. The axial skeleton is unremarkable. The heart is mildly enlarged. Lung bases are clear.  IMPRESSION: 1. Nonspecific bowel gas pattern without evidence for obstruction or free air. There is a relative paucity of gas in the abdomen, a nonspecific finding. 2. Atherosclerosis. 3. Borderline cardiomegaly without failure.   Electronically Signed   By: Marin Roberts M.D.   On: 02/03/2015 09:31    Scheduled Meds:  Scheduled Meds: . amLODipine  10 mg Oral QHS  . aspirin EC  81 mg Oral Daily  . atorvastatin  40 mg Oral q1800  . cinacalcet  90 mg Oral Q breakfast  . docusate sodium  100 mg Oral Daily  . heparin  5,000 Units Subcutaneous 3 times per day  . insulin aspart protamine- aspart  10 Units Subcutaneous Q breakfast  . insulin aspart protamine- aspart  6 Units Subcutaneous Q supper  . insulin regular  0-10 Units Intravenous TID WC  . metoprolol succinate  25 mg Oral Daily  . pantoprazole  40 mg Oral Daily  . sevelamer carbonate  3,200 mg Oral BID WC  . sodium chloride  3 mL Intravenous Q12H  . sodium chloride  3 mL Intravenous Q12H  . ticagrelor  90 mg Oral BID   Continuous Infusions: . nitroGLYCERIN Stopped (02/02/15 0500)     Time spent on care of this patient: 35 min   Breandan People, MD 02/03/2015, 10:18 AM  LOS: 2 days   [facility] Office  [phone] Pager - Text Page per [email or web address] If 7PM-7AM, please contact night-coverage [email or web address]

## 2015-02-03 NOTE — Discharge Summary (Signed)
Physician Discharge Summary  [name] DOB: May 19, 1971 DOA: 02/01/2015  PCP: Billee CashingMCKENZIE, WAYLAND, MD  Admit date: 02/01/2015 Discharge date: 02/03/2015  Time spent: 50 minutes     Discharge Condition: stable Diet recommendation: low sodium, renal, diabetic, soft heart healthy diet  Discharge Diagnoses:  Principal Problem:   NSTEMI (non-ST elevated myocardial infarction) Active Problems:   ESRD (end stage renal disease) on dialysis   Diabetes mellitus   Acute pulmonary edema   Hemoptysis   Nausea with vomiting   History of present illness:  [name] is a 44 y.o. female with end-stage renal disease, diabetes mellitus, bilateral AKA, who presented to the hospital after an episode of vomiting. She was found to have elevated troponin and underwent a cardiac cath later in the day. Cath revealed a 99% stenosis in proximal to mid circumflex which was stented with the DES stent.  Hospital Course:  Principal Problem:  NSTEMI (non-ST elevated myocardial infarction) - Troponin 2.03 on admission - rising to max of 3.44 -6/3- cath with DES stent to circumflex -will need Brilinta and aspirin 1 year -Lipitor and Metoprolol added  Active Problems:  Hemoptysis/ pulmonary edema? - hemoptysis starting on 6/4- was hypoxic with CXR showing diffuse pulm edema and effusions - in setting of use of ASA, Brilinta and Heparin for DVT prophylaxis - 6/4  was requiring 4 L of O2 to keep pulse ox at 95%- taken to dialysis to try to resolve pulm edema - 6/5 hypoxia improved despite the fact that no fluid was pulled off during dialysis - she now has a pulse ox of 95-98% on room air- CXR reveals resolution of edema - small amount of hemoptysis this morning and none since - ECHO 6/3- EF 55% mild LVH   ESRD (end stage renal disease) on dialysis -Receives dialysis Monday Wednesday Friday-was dialyzed in the evening after cardiac cath with removal of about 3 L of fluid - CXR on 6/3  revealed pulm edema and she was requiring 4 L of O2-(see above) - dialyzed again on 6/4 but per renal, no fluid was pulled off - have spoken with renal attending- no need to keep in hospital for further dialysis- can get her routine dialysis as outpt tomorrow  Vomiting - starting on the day after cath - abd xray unrevealing - normal BMs, no abdominal pain - cont anti emetics and PPI - patient insisting on going home- is no longer vomiting- has anti-emetics at home- will prescribe Reglan to use at home as well- advised to take a soft, easy to digest diet.   HTN - increased Toprol to 50 mg, added Hydralazine 50 TID - cont Amlodipine   Diabetes mellitus - A1c 7/1 on 6/3 -cont 70/30 at home    Consultants:Cardiology, nephrology Procedures: cardiac cath 6/3  Discharge Exam: Filed Weights   02/01/15 2315 02/02/15 1555 02/02/15 1800  Weight: 69.9 kg (154 lb 1.6 oz) 74 kg (163 lb 2.3 oz) 73 kg (160 lb 15 oz)   Filed Vitals:   02/03/15 1013  BP: 169/84  Pulse: 82  Temp: 98.7 Wang (37.1 C)  Resp: 20    General: AAO x 3, no distress Cardiovascular: RRR, no murmurs  Respiratory: clear to auscultation bilaterally GI: soft, non-tender, non-distended, bowel sound positive  Discharge Instructions You were cared for by a hospitalist during your hospital stay. If you have any questions about your discharge medications or the care you received while you were in the hospital after you are discharged, you  can call the unit and asked to speak with the hospitalist on call if the hospitalist that took care of you is not available. Once you are discharged, your primary care physician will handle any further medical issues. Please note that NO REFILLS for any discharge medications will be authorized once you are discharged, as it is imperative that you return to your primary care physician (or establish a relationship with a primary care physician if you do not have one) for your aftercare needs so  that they can reassess your need for medications and monitor your lab values.  Discharge Instructions    Discharge instructions    Complete by:  As directed   Low sodium heart healthy diabetic diet     Increase activity slowly    Complete by:  As directed             Medication List    TAKE these medications        amLODipine 10 MG tablet  Commonly known as:  NORVASC  Take 10 mg by mouth at bedtime.     aspirin 81 MG chewable tablet  Chew 81 mg by mouth daily.     atorvastatin 40 MG tablet  Commonly known as:  LIPITOR  Take 1 tablet (40 mg total) by mouth daily at 6 PM.     cinacalcet 90 MG tablet  Commonly known as:  SENSIPAR  Take 90 mg by mouth daily.     insulin aspart protamine- aspart (70-30) 100 UNIT/ML injection  Commonly known as:  NOVOLOG MIX 70/30  Inject 6-10 Units into the skin 2 (two) times daily with a meal. Take 10 units in the morning and 6 units in the evening     metoprolol 50 MG tablet  Commonly known as:  LOPRESSOR  Take 1 tablet (50 mg total) by mouth 2 (two) times daily.     omeprazole 20 MG capsule  Commonly known as:  PRILOSEC  Take 20 mg by mouth 2 (two) times daily.     sevelamer carbonate 800 MG tablet  Commonly known as:  RENVELA  Take 3,200 mg by mouth 2 (two) times daily with a meal.     ticagrelor 90 MG Tabs tablet  Commonly known as:  BRILINTA  Take 1 tablet (90 mg total) by mouth 2 (two) times daily.       Allergies  Allergen Reactions  . Tramadol Itching  . Percocet [Oxycodone-Acetaminophen] Other (See Comments)    GI UPSET  . Soap Itching    IVORY SOAP      The results of significant diagnostics from this hospitalization (including imaging, microbiology, ancillary and laboratory) are listed below for reference.    Significant Diagnostic Studies: Dg Chest Port 1 View  02/03/2015   CLINICAL DATA:  Hypoxia.  EXAM: PORTABLE CHEST - 1 VIEW  COMPARISON:  One-view chest 02/02/2015.  FINDINGS: The heart is mildly enlarged.  This is exaggerated by low lung volumes. Aeration is improving. Persistent bibasilar airspace disease is noted, left greater than right.  IMPRESSION: 1. Left greater than right basilar airspace disease is improving. While this may represent atelectasis, infection is also considered. 2. Decreasing interstitial edema.   Electronically Signed   By: Marin Roberts M.D.   On: 02/03/2015 09:24   Dg Chest Port 1 View  02/02/2015   CLINICAL DATA:  Follow-up pulmonary edema  EXAM: PORTABLE CHEST - 1 VIEW  COMPARISON:  05/19/2013; 04/21/2013 ; 06/27/2012  FINDINGS: Grossly unchanged enlarged cardiac silhouette and  mediastinal contours. The pulmonary vasculature appears less distinct than present examination with cephalization of flow. Interval development of trace bilateral effusions with associated worsening bibasilar opacities, likely atelectasis. No pneumothorax. No acute osseus abnormalities.  IMPRESSION: Findings suggestive of worsening pulmonary edema with development of trace bilateral effusions and worsening bibasilar opacities, likely atelectasis.   Electronically Signed   By: Simonne Come M.D.   On: 02/02/2015 13:21   Dg Abd Portable 1v  02/03/2015   CLINICAL DATA:  Right-sided abdominal pain.  Nausea and vomiting.  EXAM: PORTABLE ABDOMEN - 1 VIEW  COMPARISON:  CT of the abdomen and pelvis 06/23/2012.  FINDINGS: A relatively gasless abdomen is present. There is no obstruction or free air. Atherosclerotic calcifications are again noted within the bones Derek arteries. Calcified uterine fibroids are evident. The axial skeleton is unremarkable. The heart is mildly enlarged. Lung bases are clear.  IMPRESSION: 1. Nonspecific bowel gas pattern without evidence for obstruction or free air. There is a relative paucity of gas in the abdomen, a nonspecific finding. 2. Atherosclerosis. 3. Borderline cardiomegaly without failure.   Electronically Signed   By: Marin Roberts M.D.   On: 02/03/2015 09:31     Microbiology: Recent Results (from the past 240 hour(s))  MRSA PCR Screening     Status: None   Collection Time: 02/01/15 10:37 AM  Result Value Ref Range Status   MRSA by PCR NEGATIVE NEGATIVE Final    Comment:        The GeneXpert MRSA Assay (FDA approved for NASAL specimens only), is one component of a comprehensive MRSA colonization surveillance program. It is not intended to diagnose MRSA infection nor to guide or monitor treatment for MRSA infections.      Labs: Basic Metabolic Panel:  Recent Labs Lab 02/01/15 0148 02/02/15 0320  NA 136 138  K 3.5 4.1  CL 93* 93*  CO2 29 27  GLUCOSE 330* 242*  BUN 14 12  CREATININE 5.83* 4.61*  CALCIUM 9.8 9.1   Liver Function Tests:  Recent Labs Lab 02/01/15 0148  AST 18  ALT 9*  ALKPHOS 65  BILITOT 0.5  PROT 8.2*  ALBUMIN 3.5    Recent Labs Lab 02/01/15 0148  LIPASE 19*   No results for input(s): AMMONIA in the last 168 hours. CBC:  Recent Labs Lab 02/01/15 0148 02/02/15 0320 02/03/15 0706  WBC 15.5* 15.1* 14.5*  NEUTROABS 13.6*  --   --   HGB 11.5* 10.5* 10.8*  HCT 34.7* 30.6* 32.0*  MCV 86.3 83.8 84.0  PLT 282 393 316   Cardiac Enzymes:  Recent Labs Lab 02/01/15 0148 02/01/15 0521 02/01/15 1111 02/01/15 2040  TROPONINI 2.03* 2.22* 2.20* 3.44*   BNP: BNP (last 3 results) No results for input(s): BNP in the last 8760 hours.  ProBNP (last 3 results) No results for input(s): PROBNP in the last 8760 hours.  CBG:  Recent Labs Lab 02/02/15 0839 02/02/15 1132 02/02/15 2008 02/03/15 0802 02/03/15 1207  GLUCAP 257* 236* 288* 373* 329*       SignedCalvert Cantor, MD [facility] 02/03/2015, 4:23 PM

## 2015-02-03 NOTE — Progress Notes (Signed)
Report given to Alycia Rossettiyan, RN at this time.

## 2015-02-03 NOTE — Progress Notes (Signed)
02/03/2015  5:28 PM  Leward Quanegina F Stallbaumer to be D/C'd Home per MD order.  Discussed prescriptions and follow up appointments with the patient. Prescriptions given to patient, medication list explained in detail. Pt verbalized understanding.    Medication List    TAKE these medications        amLODipine 10 MG tablet  Commonly known as:  NORVASC  Take 10 mg by mouth at bedtime.     aspirin 81 MG chewable tablet  Chew 81 mg by mouth daily.     atorvastatin 40 MG tablet  Commonly known as:  LIPITOR  Take 1 tablet (40 mg total) by mouth daily at 6 PM.     cinacalcet 90 MG tablet  Commonly known as:  SENSIPAR  Take 90 mg by mouth daily.     hydrALAZINE 50 MG tablet  Commonly known as:  APRESOLINE  Take 1 tablet (50 mg total) by mouth every 8 (eight) hours.     insulin aspart protamine- aspart (70-30) 100 UNIT/ML injection  Commonly known as:  NOVOLOG MIX 70/30  Inject 6-10 Units into the skin 2 (two) times daily with a meal. Take 10 units in the morning and 6 units in the evening     metoCLOPramide 5 MG/5ML solution  Commonly known as:  REGLAN  Take 10 mLs (10 mg total) by mouth every 6 (six) hours as needed for nausea or vomiting.     metoprolol 50 MG tablet  Commonly known as:  LOPRESSOR  Take 1 tablet (50 mg total) by mouth 2 (two) times daily.     omeprazole 20 MG capsule  Commonly known as:  PRILOSEC  Take 20 mg by mouth 2 (two) times daily.     sevelamer carbonate 800 MG tablet  Commonly known as:  RENVELA  Take 3,200 mg by mouth 2 (two) times daily with a meal.     ticagrelor 90 MG Tabs tablet  Commonly known as:  BRILINTA  Take 1 tablet (90 mg total) by mouth 2 (two) times daily.        Filed Vitals:   02/03/15 1013  BP: 169/84  Pulse: 82  Temp: 98.7 F (37.1 C)  Resp: 20    Skin clean, dry and intact without evidence of skin break down, no evidence of skin tears noted. IV catheter discontinued intact. Site without signs and symptoms of complications.  Dressing and pressure applied. Pt denies pain at this time. No complaints noted.  An After Visit Summary was printed and given to the patient. Patient escorted via WC, and D/C home via private auto.  PACCAR Incyanne Hill BSN, RN-BC, [employer] [facility] Phone [phone]

## 2015-02-04 ENCOUNTER — Encounter (HOSPITAL_COMMUNITY): Payer: Self-pay | Admitting: Cardiovascular Disease

## 2015-02-04 ENCOUNTER — Emergency Department (HOSPITAL_COMMUNITY): Payer: Medicare Other

## 2015-02-04 ENCOUNTER — Inpatient Hospital Stay (HOSPITAL_COMMUNITY)
Admission: EM | Admit: 2015-02-04 | Discharge: 2015-02-05 | DRG: 313 | Disposition: A | Discharge status: Home / Self Care | Payer: Medicare Other | Attending: Internal Medicine | Admitting: Internal Medicine

## 2015-02-04 DIAGNOSIS — Z79899 Other long term (current) drug therapy: Secondary | ICD-10-CM

## 2015-02-04 DIAGNOSIS — K219 Gastro-esophageal reflux disease without esophagitis: Secondary | ICD-10-CM | POA: Diagnosis present

## 2015-02-04 DIAGNOSIS — Z992 Dependence on renal dialysis: Secondary | ICD-10-CM | POA: Diagnosis not present

## 2015-02-04 DIAGNOSIS — Z89611 Acquired absence of right leg above knee: Secondary | ICD-10-CM

## 2015-02-04 DIAGNOSIS — I120 Hypertensive chronic kidney disease with stage 5 chronic kidney disease or end stage renal disease: Secondary | ICD-10-CM | POA: Diagnosis present

## 2015-02-04 DIAGNOSIS — I2510 Atherosclerotic heart disease of native coronary artery without angina pectoris: Secondary | ICD-10-CM | POA: Diagnosis present

## 2015-02-04 DIAGNOSIS — R079 Chest pain, unspecified: Secondary | ICD-10-CM | POA: Diagnosis present

## 2015-02-04 DIAGNOSIS — Z89612 Acquired absence of left leg above knee: Secondary | ICD-10-CM | POA: Diagnosis not present

## 2015-02-04 DIAGNOSIS — Z8614 Personal history of Methicillin resistant Staphylococcus aureus infection: Secondary | ICD-10-CM | POA: Diagnosis not present

## 2015-02-04 DIAGNOSIS — Z794 Long term (current) use of insulin: Secondary | ICD-10-CM

## 2015-02-04 DIAGNOSIS — N186 End stage renal disease: Secondary | ICD-10-CM | POA: Diagnosis present

## 2015-02-04 DIAGNOSIS — I272 Other secondary pulmonary hypertension: Secondary | ICD-10-CM | POA: Diagnosis present

## 2015-02-04 DIAGNOSIS — Z7982 Long term (current) use of aspirin: Secondary | ICD-10-CM | POA: Diagnosis not present

## 2015-02-04 DIAGNOSIS — R7989 Other specified abnormal findings of blood chemistry: Secondary | ICD-10-CM | POA: Diagnosis not present

## 2015-02-04 DIAGNOSIS — E1151 Type 2 diabetes mellitus with diabetic peripheral angiopathy without gangrene: Secondary | ICD-10-CM | POA: Diagnosis present

## 2015-02-04 DIAGNOSIS — R109 Unspecified abdominal pain: Secondary | ICD-10-CM

## 2015-02-04 DIAGNOSIS — K859 Acute pancreatitis, unspecified: Secondary | ICD-10-CM

## 2015-02-04 DIAGNOSIS — Z955 Presence of coronary angioplasty implant and graft: Secondary | ICD-10-CM | POA: Diagnosis not present

## 2015-02-04 DIAGNOSIS — Z9861 Coronary angioplasty status: Secondary | ICD-10-CM

## 2015-02-04 DIAGNOSIS — I739 Peripheral vascular disease, unspecified: Secondary | ICD-10-CM | POA: Insufficient documentation

## 2015-02-04 DIAGNOSIS — I214 Non-ST elevation (NSTEMI) myocardial infarction: Secondary | ICD-10-CM

## 2015-02-04 LAB — I-STAT TROPONIN, ED: Troponin i, poc: 1.27 ng/mL (ref 0.00–0.08)

## 2015-02-04 LAB — COMPREHENSIVE METABOLIC PANEL
ALBUMIN: 3.5 g/dL (ref 3.5–5.0)
ALT: 9 U/L — AB (ref 14–54)
AST: 15 U/L (ref 15–41)
Alkaline Phosphatase: 66 U/L (ref 38–126)
Anion gap: 18 — ABNORMAL HIGH (ref 5–15)
BILIRUBIN TOTAL: 1.2 mg/dL (ref 0.3–1.2)
BUN: 16 mg/dL (ref 6–20)
CHLORIDE: 94 mmol/L — AB (ref 101–111)
CO2: 26 mmol/L (ref 22–32)
CREATININE: 3.28 mg/dL — AB (ref 0.44–1.00)
Calcium: 9 mg/dL (ref 8.9–10.3)
GFR calc Af Amer: 19 mL/min — ABNORMAL LOW (ref >60)
GFR, EST NON AFRICAN AMERICAN: 16 mL/min — AB (ref >60)
GLUCOSE: 92 mg/dL (ref 65–99)
Potassium: 3.2 mmol/L — ABNORMAL LOW (ref 3.5–5.1)
SODIUM: 138 mmol/L (ref 135–145)
Total Protein: 8.1 g/dL (ref 6.5–8.1)

## 2015-02-04 LAB — CBC WITH DIFFERENTIAL/PLATELET
BASOS ABS: 0 10*3/uL (ref 0.0–0.1)
Basophils Relative: 0 % (ref 0–1)
Eosinophils Absolute: 0 10*3/uL (ref 0.0–0.7)
Eosinophils Relative: 0 % (ref 0–5)
HEMATOCRIT: 35.5 % — AB (ref 36.0–46.0)
HEMOGLOBIN: 12 g/dL (ref 12.0–15.0)
LYMPHS PCT: 13 % (ref 12–46)
Lymphs Abs: 1.5 10*3/uL (ref 0.7–4.0)
MCH: 28.2 pg (ref 26.0–34.0)
MCHC: 33.8 g/dL (ref 30.0–36.0)
MCV: 83.5 fL (ref 78.0–100.0)
Monocytes Absolute: 0.8 10*3/uL (ref 0.1–1.0)
Monocytes Relative: 7 % (ref 3–12)
NEUTROS ABS: 9.5 10*3/uL — AB (ref 1.7–7.7)
NEUTROS PCT: 80 % — AB (ref 43–77)
PLATELETS: 394 10*3/uL (ref 150–400)
RBC: 4.25 MIL/uL (ref 3.87–5.11)
RDW: 16 % — AB (ref 11.5–15.5)
WBC: 11.9 10*3/uL — ABNORMAL HIGH (ref 4.0–10.5)

## 2015-02-04 LAB — GLUCOSE, CAPILLARY: GLUCOSE-CAPILLARY: 198 mg/dL — AB (ref 65–99)

## 2015-02-04 LAB — LIPASE, BLOOD: LIPASE: 99 U/L — AB (ref 22–51)

## 2015-02-04 MED ORDER — MORPHINE SULFATE 4 MG/ML IJ SOLN
4.0000 mg | Freq: Once | INTRAMUSCULAR | Status: AC
  Administered 2015-02-04: 4 mg via INTRAVENOUS
  Filled 2015-02-04: qty 1

## 2015-02-04 MED FILL — Perflutren Lipid Microsphere IV Susp 1.1 MG/ML: INTRAVENOUS | Qty: 10 | Status: AC

## 2015-02-04 NOTE — ED Provider Notes (Signed)
CSN: [id number]     Arrival date & time 02/04/15  2036 History   First MD Initiated Contact with Patient 02/04/15 2149     Chief Complaint  Patient presents with  . Shortness of Breath  . Abdominal Pain  . Back Pain     (Consider location/radiation/quality/duration/timing/severity/associated sxs/prior Treatment) HPI Comments: Patient presents with epigastric pain. She was recently admitted for epigastric pain and vomiting and had a cardiac cath on June 4 showing 90% stenosis of the left circumflex. A drug-eluting stent was placed.  She states she was feeling better until this morning when she started having a return of epigastric pain. It radiates toward her chest. She's had some nausea. She also feels short of breath. She has a history of diabetes mellitus and end-stage renal disease. She status post bilateral AKA's. She states she completed dialysis today.   Past Medical History  Diagnosis Date  . GERD (gastroesophageal reflux disease)   . Diabetes mellitus     IDDM  . Hypertension     states has been on med. x "a long time"  . ESRF (end stage renal failure)     dialysis M,W, F; left thigh AV graft  . History of gangrene     left foot  . Hx MRSA infection   . 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 Pneumonia 04/20/2013   Past Surgical History  Procedure Laterality Date  . Av fistula placement  10/18/2008    right thigh AV graft  . Finger debridement  06/20/2010    right middle finger  . Finger amputation  05/27/2010    right middle  . Thrombectomy / arteriovenous graft revision  01/14/2010    left superficial femoral artery; left thigh AV graft placement  . 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 Leg amputation below knee  10/11/2009    right  . Central venous catheter insertion  08/06/2009    removal right IJ Diatek cath., placement left IJ Diatek cath.  . Above knee leg amputation  05/21/2009    left  . Foot amputation  04/04/2009    left transtibial  amputation  . Excision / curettage bone cyst talus / calcaneus  03/04/2009    partial calcaneal exc. left; placement wound VAC  . Groin debridement  11/16/2008    right rectus femoris muscle flap to right groin wound; right groing debridement  . Femoral endarterectomy  11/06/2008    right common femoral; embolectomy right femoral artery; removal right femoral AV graft  . Central venous catheter insertion  07/10/2008; 12/31/2006; 01/19/2006; 06/19/2005; 09/19/2004; 09/08/2004    right IJ Diatek catheter  . Av fistula repair  07/08/2008    removal left upper arm AV graft  . Thrombectomy / arteriovenous graft revision  05/11/2007;12/31/2006; 01/18/2006; 12/16/2005    left upper arm  . Dialysis fistula creation  01/20/2007    left upper arm AV graft  . Dialysis fistula creation  01/28/2006    right upper arm AV graft  . Thrombectomy / arteriovenous graft revision  10/26/2005    left forearm AV graft  . Dialysis fistula creation  10/04/2005    left forearm AV graft  . Av fistula repair  07/14/2005    removal infected right forearm AV graft  . Thrombectomy / arteriovenous graft revision  03/11/2005; 10/20/2004    right forearm AV graft  . Dialysis fistula creation  09/10/2004    right forearm AV graft  . Av fistula  placement  07/18/2004    creation left AV fistula, radial to cephalic  . Finger exploration  07/13/2002    and repair left middle finger  . Carpal tunnel release  01/26/2012    Procedure: CARPAL TUNNEL RELEASE;  Surgeon: Nicki Reaper, MD;  Location: [facility];  Service: Orthopedics;  Laterality: Left;  . Esophagogastroduodenoscopy  06/26/2012    Procedure: ESOPHAGOGASTRODUODENOSCOPY (EGD);  Surgeon: Charna Elizabeth, MD;  Location[facility] ENDOSCOPY;  Service: Endoscopy;  Laterality: N/A;  . Shuntogram N/A 11/12/2011    Procedure: Betsey Amen;  Surgeon: Fransisco Hertz, MD;  Location: [facility] CATH LAB;  Service: Cardiovascular;  Laterality: N/A;  . Cardiac catheterization N/A 02/01/2015    Procedure: Left  Heart Cath and Coronary Angiography;  Surgeon: Lennette Bihari, MD;  Location: [facility] INVASIVE CV LAB;  Service: Cardiovascular;  Laterality: N/A;  . Cardiac catheterization N/A 02/01/2015    Procedure: Coronary Stent Intervention;  Surgeon: Lennette Bihari, MD;  Location: [facility] INVASIVE CV LAB;  Service: Cardiovascular;  Laterality: N/A;   Family History  Problem Relation Age of Onset  . Diabetes Mother   . Heart disease Mother   . Hypertension Mother   . Heart attack Mother 75  . Diabetes Sister   . Hypertension Brother   . Heart attack Brother    History  Substance Use Topics  . Smoking status: Never Smoker   . Smokeless tobacco: Never Used  . Alcohol Use: No   OB History    No data available     Review of Systems  Constitutional: Negative for fever, chills, diaphoresis and fatigue.  HENT: Negative for congestion, rhinorrhea and sneezing.   Eyes: Negative.   Respiratory: Positive for shortness of breath. Negative for cough and chest tightness.   Cardiovascular: Negative for chest pain and leg swelling.  Gastrointestinal: Positive for nausea and abdominal pain. Negative for vomiting, diarrhea and blood in stool.  Genitourinary: Negative for frequency, hematuria, flank pain and difficulty urinating.  Musculoskeletal: Negative for back pain and arthralgias.  Skin: Negative for rash.  Neurological: Negative for dizziness, speech difficulty, weakness, numbness and headaches.      Allergies  Tramadol; Percocet; and Soap  Home Medications   Prior to Admission medications   Medication Sig Start Date End Date Taking? Authorizing Provider  amLODipine (NORVASC) 10 MG tablet Take 10 mg by mouth at bedtime.   Yes Historical Provider, MD  aspirin 81 MG chewable tablet Chew 81 mg by mouth daily.   Yes Historical Provider, MD  atorvastatin (LIPITOR) 40 MG tablet Take 1 tablet (40 mg total) by mouth daily at 6 PM. 02/02/15  Yes Calvert Cantor, MD  cinacalcet (SENSIPAR) 90 MG tablet Take 90 mg by  mouth daily.   Yes Historical Provider, MD  hydrALAZINE (APRESOLINE) 50 MG tablet Take 1 tablet (50 mg total) by mouth every 8 (eight) hours. 02/03/15  Yes Calvert Cantor, MD  insulin aspart protamine-insulin aspart (NOVOLOG 70/30) (70-30) 100 UNIT/ML injection Inject 6-10 Units into the skin 2 (two) times daily with a meal. Take 10 units in the morning and 6 units in the evening   Yes Historical Provider, MD  metoCLOPramide (REGLAN) 5 MG/5ML solution Take 10 mLs (10 mg total) by mouth every 6 (six) hours as needed for nausea or vomiting. 02/03/15  Yes Calvert Cantor, MD  metoprolol (LOPRESSOR) 50 MG tablet Take 1 tablet (50 mg total) by mouth 2 (two) times daily. 02/03/15  Yes Calvert Cantor, MD  NOVOLOG 100 UNIT/ML injection  Inject 4-10 Units into the skin 3 (three) times daily with meals.  01/02/15  Yes Historical Provider, MD  omeprazole (PRILOSEC) 20 MG capsule Take 20 mg by mouth 2 (two) times daily.   Yes Historical Provider, MD  sevelamer (RENVELA) 800 MG tablet Take 3,200 mg by mouth 2 (two) times daily with a meal.    Yes Historical Provider, MD  ticagrelor (BRILINTA) 90 MG TABS tablet Take 1 tablet (90 mg total) by mouth 2 (two) times daily. 02/02/15  Yes Saima Rizwan, MD   BP 145/79 mmHg  Pulse 74  Temp(Src) 98.3 F (36.8 C) (Oral)  Resp 14  Ht 5\' 3"  (1.6 m)  SpO2 100% Physical Exam  Constitutional: She is oriented to person, place, and time. She appears well-developed and well-nourished.  HENT:  Head: Normocephalic and atraumatic.  Eyes: Pupils are equal, round, and reactive to light.  Neck: Normal range of motion. Neck supple.  Cardiovascular: Normal rate, regular rhythm and normal heart sounds.   Pulmonary/Chest: Effort normal and breath sounds normal. No respiratory distress. She has no wheezes. She has no rales. She exhibits no tenderness.  Abdominal: Soft. Bowel sounds are normal. There is tenderness (tenderness to epigastrium). There is no rebound and no guarding.  Musculoskeletal:  Normal range of motion. She exhibits no edema.  Bilateral AKA's  Lymphadenopathy:    She has no cervical adenopathy.  Neurological: She is alert and oriented to person, place, and time.  Skin: Skin is warm and dry. No rash noted.  Psychiatric: She has a normal mood and affect.    ED Course  Procedures (including critical care time) Labs Review Labs Reviewed  CBC WITH DIFFERENTIAL/PLATELET - Abnormal; Notable for the following:    WBC 11.9 (*)    HCT 35.5 (*)    RDW 16.0 (*)    Neutrophils Relative % 80 (*)    Neutro Abs 9.5 (*)    All other components within normal limits  COMPREHENSIVE METABOLIC PANEL - Abnormal; Notable for the following:    Potassium 3.2 (*)    Chloride 94 (*)    Creatinine, Ser 3.28 (*)    ALT 9 (*)    GFR calc non Af Amer 16 (*)    GFR calc Af Amer 19 (*)    Anion gap 18 (*)    All other components within normal limits  LIPASE, BLOOD - Abnormal; Notable for the following:    Lipase 99 (*)    All other components within normal limits  I-STAT TROPOININ, ED - Abnormal; Notable for the following:    Troponin i, poc 1.27 (*)    All other components within normal limits  CK TOTAL AND CKMB (NOT AT [facility])    Imaging Review Dg Chest Port 1 View  02/04/2015   CLINICAL DATA:  Shortness of breath for 1 day, recent cardiac stent placement  EXAM: PORTABLE CHEST - 1 VIEW  COMPARISON:  02/03/2015  FINDINGS: Cardiac shadow is within normal limits. Lungs are well aerated bilaterally. The previously seen changes have nearly completely resolved. Some minimal residual changes are noted in the left base. No other focal abnormality is seen.  IMPRESSION: Near complete resolution of previously seen bilateral infiltrative change. Some minimal residual changes in the left base are noted.   Electronically Signed   By: Alcide Clever M.D.   On: 02/04/2015 22:16   Dg Chest Port 1 View  02/03/2015   CLINICAL DATA:  Hypoxia.  EXAM: PORTABLE CHEST - 1 VIEW  COMPARISON:  One-view chest  02/02/2015.  FINDINGS: The heart is mildly enlarged. This is exaggerated by low lung volumes. Aeration is improving. Persistent bibasilar airspace disease is noted, left greater than right.  IMPRESSION: 1. Left greater than right basilar airspace disease is improving. While this may represent atelectasis, infection is also considered. 2. Decreasing interstitial edema.   Electronically Signed   By: Marin Robertshristopher  Mattern M.D.   On: 02/03/2015 09:24   Dg Abd Portable 1v  02/03/2015   CLINICAL DATA:  Right-sided abdominal pain.  Nausea and vomiting.  EXAM: PORTABLE ABDOMEN - 1 VIEW  COMPARISON:  CT of the abdomen and pelvis 06/23/2012.  FINDINGS: A relatively gasless abdomen is present. There is no obstruction or free air. Atherosclerotic calcifications are again noted within the bones Derek arteries. Calcified uterine fibroids are evident. The axial skeleton is unremarkable. The heart is mildly enlarged. Lung bases are clear.  IMPRESSION: 1. Nonspecific bowel gas pattern without evidence for obstruction or free air. There is a relative paucity of gas in the abdomen, a nonspecific finding. 2. Atherosclerosis. 3. Borderline cardiomegaly without failure.   Electronically Signed   By: Marin Robertshristopher  Mattern M.D.   On: 02/03/2015 09:31     EKG Interpretation   Date/Time:  Monday February 04 2015 20:55:56 EDT Ventricular Rate:  72 PR Interval:  140 QRS Duration: 86 QT Interval:  508 QTC Calculation: 556 R Axis:   61 Text Interpretation:  Long QTc Normal sinus rhythm Anterior infarct , age  undetermined T wave abnormality, consider lateral ischemia Prolonged QT  Abnormal ECG since last tracing no significant change Reconfirmed by Daelan Gatt   MD, Haylyn Halberg ([id number]) on 02/05/2015 12:52:59 AM      MDM   Final diagnoses:  Acute pancreatitis, unspecified pancreatitis type  NSTEMI (non-ST elevated myocardial infarction)    22:15 spoke with cardiology who feels that this is unlikely due to stent thrombosis.  Advised to  recheck trop in 2-3 hours, check CK-MB.  CK-MB normal, Dr. Tresa EndoKelly recommends admission overnight for serial enzymes.  Has consulted on pt, feels that her elevated trop tonight is likely trending down from recent hospitalization.  Has mild elevation in lipase.  Pain free now.  Spoke with DR. Hijazi who will admit pt.  Will check with floor to see if pt can go to telemetry vs stepdown  Rolan BuccoMelanie Hansen Carino, MD 02/05/15 [phone]

## 2015-02-04 NOTE — Consult Note (Signed)
Reason for Consult: elevated troponin, abdominal discomfort Referring Physician: Dr. Tamera Punt Primary cardiologist: new [name] is an 44 y.o. female.  HPI: [name] is a 44 yo woman with PMH of ESRD on dialysis, GERD, hypertension, bilateral AKA who had PCI 02/02/15 of LCx with elevated troponin with initial presentation with abdominal discomfort who returns today with abdominal discomfort and found to have persistently elevated troponin. Her last hemodialysis session was today and well tolerated - no blood pressure drops or chest pain. No diaphoresis at home. She knows the name of her medications - aspirin and brilinta and says she hasn't missed any doses.    Past Medical History  Diagnosis Date  . GERD (gastroesophageal reflux disease)   . Diabetes mellitus     IDDM  . Hypertension     states has been on med. x "a long time"  . ESRF (end stage renal failure)     dialysis M,W, F; left thigh AV graft  . History of gangrene     left foot  . Hx MRSA infection   . 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 Pneumonia 04/20/2013    Past Surgical History  Procedure Laterality Date  . Av fistula placement  10/18/2008    right thigh AV graft  . Finger debridement  06/20/2010    right middle finger  . Finger amputation  05/27/2010    right middle  . Thrombectomy / arteriovenous graft revision  01/14/2010    left superficial femoral artery; left thigh AV graft placement  . 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 Leg amputation below knee  10/11/2009    right  . Central venous catheter insertion  08/06/2009    removal right IJ Diatek cath., placement left IJ Diatek cath.  . Above knee leg amputation  05/21/2009    left  . Foot amputation  04/04/2009    left transtibial amputation  . Excision / curettage bone cyst talus / calcaneus  03/04/2009    partial calcaneal exc. left; placement wound VAC  . Groin debridement  11/16/2008    right rectus femoris muscle flap to  right groin wound; right groing debridement  . Femoral endarterectomy  11/06/2008    right common femoral; embolectomy right femoral artery; removal right femoral AV graft  . Central venous catheter insertion  07/10/2008; 12/31/2006; 01/19/2006; 06/19/2005; 09/19/2004; 09/08/2004    right IJ Diatek catheter  . Av fistula repair  07/08/2008    removal left upper arm AV graft  . Thrombectomy / arteriovenous graft revision  05/11/2007;12/31/2006; 01/18/2006; 12/16/2005    left upper arm  . Dialysis fistula creation  01/20/2007    left upper arm AV graft  . Dialysis fistula creation  01/28/2006    right upper arm AV graft  . Thrombectomy / arteriovenous graft revision  10/26/2005    left forearm AV graft  . Dialysis fistula creation  10/04/2005    left forearm AV graft  . Av fistula repair  07/14/2005    removal infected right forearm AV graft  . Thrombectomy / arteriovenous graft revision  03/11/2005; 10/20/2004    right forearm AV graft  . Dialysis fistula creation  09/10/2004    right forearm AV graft  . Av fistula placement  07/18/2004    creation left AV fistula, radial to cephalic  . Finger exploration  07/13/2002    and repair left middle finger  . Carpal tunnel release  01/26/2012  Procedure: CARPAL TUNNEL RELEASE;  Surgeon: Wynonia Sours, MD;  Location: [facility];  Service: Orthopedics;  Laterality: Left;  . Esophagogastroduodenoscopy  06/26/2012    Procedure: ESOPHAGOGASTRODUODENOSCOPY (EGD);  Surgeon: Juanita Craver, MD;  Location: [facility] ENDOSCOPY;  Service: Endoscopy;  Laterality: N/A;  . Shuntogram N/A 11/12/2011    Procedure: Earney Mallet;  Surgeon: Conrad Maurice, MD;  Location: [facility] CATH LAB;  Service: Cardiovascular;  Laterality: N/A;  . Cardiac catheterization N/A 02/01/2015    Procedure: Left Heart Cath and Coronary Angiography;  Surgeon: Troy Sine, MD;  Location: [place] CV LAB;  Service: Cardiovascular;  Laterality: N/A;  . Cardiac catheterization N/A 02/01/2015    Procedure:  Coronary Stent Intervention;  Surgeon: Troy Sine, MD;  Location: [place] CV LAB;  Service: Cardiovascular;  Laterality: N/A;    Family History  Problem Relation Age of Onset  . Diabetes Mother   . Heart disease Mother   . Hypertension Mother   . Heart attack Mother 54  . Diabetes Sister   . Hypertension Brother   . Heart attack Brother     Social History:  reports that she has never smoked. She has never used smokeless tobacco. She reports that she does not drink alcohol or use illicit drugs.  Allergies:  Allergies  Allergen Reactions  . Tramadol Itching  . Percocet [Oxycodone-Acetaminophen] Other (See Comments)    GI UPSET  . Soap Itching    IVORY SOAP    Medications: I have reviewed the patient's current medications. Prior to Admission:  (Not in a hospital admission) Scheduled: Continuous:  Results for orders placed or performed during the hospital encounter of 02/04/15 (from the past 48 hour(s))  CBC with Differential     Status: Abnormal   Collection Time: 02/04/15  9:14 PM  Result Value Ref Range   WBC 11.9 (H) 4.0 - 10.5 K/uL   RBC 4.25 3.87 - 5.11 MIL/uL   Hemoglobin 12.0 12.0 - 15.0 g/dL   HCT 35.5 (L) 36.0 - 46.0 %   MCV 83.5 78.0 - 100.0 fL   MCH 28.2 26.0 - 34.0 pg   MCHC 33.8 30.0 - 36.0 g/dL   RDW 16.0 (H) 11.5 - 15.5 %   Platelets 394 150 - 400 K/uL   Neutrophils Relative % 80 (H) 43 - 77 %   Neutro Abs 9.5 (H) 1.7 - 7.7 K/uL   Lymphocytes Relative 13 12 - 46 %   Lymphs Abs 1.5 0.7 - 4.0 K/uL   Monocytes Relative 7 3 - 12 %   Monocytes Absolute 0.8 0.1 - 1.0 K/uL   Eosinophils Relative 0 0 - 5 %   Eosinophils Absolute 0.0 0.0 - 0.7 K/uL   Basophils Relative 0 0 - 1 %   Basophils Absolute 0.0 0.0 - 0.1 K/uL  Comprehensive metabolic panel     Status: Abnormal   Collection Time: 02/04/15  9:14 PM  Result Value Ref Range   Sodium 138 135 - 145 mmol/L   Potassium 3.2 (L) 3.5 - 5.1 mmol/L   Chloride 94 (L) 101 - 111 mmol/L   CO2 26 22 - 32  mmol/L   Glucose, Bld 92 65 - 99 mg/dL   BUN 16 6 - 20 mg/dL   Creatinine, Ser 3.28 (H) 0.44 - 1.00 mg/dL   Calcium 9.0 8.9 - 10.3 mg/dL   Total Protein 8.1 6.5 - 8.1 g/dL   Albumin 3.5 3.5 - 5.0 g/dL   AST 15 15 - 41  U/L   ALT 9 (L) 14 - 54 U/L   Alkaline Phosphatase 66 38 - 126 U/L   Total Bilirubin 1.2 0.3 - 1.2 mg/dL   GFR calc non Af Amer 16 (L) >60 mL/min   GFR calc Af Amer 19 (L) >60 mL/min    Comment: (NOTE) The eGFR has been calculated using the CKD EPI equation. This calculation has not been validated in all clinical situations. eGFR's persistently <60 mL/min signify possible Chronic Kidney Disease.    Anion gap 18 (H) 5 - 15  Lipase, blood     Status: Abnormal   Collection Time: 02/04/15  9:14 PM  Result Value Ref Range   Lipase 99 (H) 22 - 51 U/L  I-stat troponin, ED (only if pt is 45 y.o. or older & pain is above umbilicus)  not at [facility]     Status: Abnormal   Collection Time: 02/04/15  9:29 PM  Result Value Ref Range   Troponin i, poc 1.27 (HH) 0.00 - 0.08 ng/mL   Comment NOTIFIED PHYSICIAN    Comment 3            Comment: Due to the release kinetics of cTnI, a negative result within the first hours of the onset of symptoms does not rule out myocardial infarction with certainty. If myocardial infarction is still suspected, repeat the test at appropriate intervals.     Dg Chest Port 1 View  02/04/2015   CLINICAL DATA:  Shortness of breath for 1 day, recent cardiac stent placement  EXAM: PORTABLE CHEST - 1 VIEW  COMPARISON:  02/03/2015  FINDINGS: Cardiac shadow is within normal limits. Lungs are well aerated bilaterally. The previously seen changes have nearly completely resolved. Some minimal residual changes are noted in the left base. No other focal abnormality is seen.  IMPRESSION: Near complete resolution of previously seen bilateral infiltrative change. Some minimal residual changes in the left base are noted.   Electronically Signed   By: Inez Catalina  M.D.   On: 02/04/2015 22:16   Dg Chest Port 1 View  02/03/2015   CLINICAL DATA:  Hypoxia.  EXAM: PORTABLE CHEST - 1 VIEW  COMPARISON:  One-view chest 02/02/2015.  FINDINGS: The heart is mildly enlarged. This is exaggerated by low lung volumes. Aeration is improving. Persistent bibasilar airspace disease is noted, left greater than right.  IMPRESSION: 1. Left greater than right basilar airspace disease is improving. While this may represent atelectasis, infection is also considered. 2. Decreasing interstitial edema.   Electronically Signed   By: San Morelle M.D.   On: 02/03/2015 09:24   Dg Abd Portable 1v  02/03/2015   CLINICAL DATA:  Right-sided abdominal pain.  Nausea and vomiting.  EXAM: PORTABLE ABDOMEN - 1 VIEW  COMPARISON:  CT of the abdomen and pelvis 06/23/2012.  FINDINGS: A relatively gasless abdomen is present. There is no obstruction or free air. Atherosclerotic calcifications are again noted within the bones Derek arteries. Calcified uterine fibroids are evident. The axial skeleton is unremarkable. The heart is mildly enlarged. Lung bases are clear.  IMPRESSION: 1. Nonspecific bowel gas pattern without evidence for obstruction or free air. There is a relative paucity of gas in the abdomen, a nonspecific finding. 2. Atherosclerosis. 3. Borderline cardiomegaly without failure.   Electronically Signed   By: San Morelle M.D.   On: 02/03/2015 09:31    Review of Systems  Constitutional: Positive for malaise/fatigue. Negative for chills and weight loss.  HENT: Negative for hearing loss and  tinnitus.   Eyes: Negative for double vision and photophobia.  Respiratory: Negative for cough, hemoptysis and sputum production.   Cardiovascular: Negative for chest pain and palpitations.  Gastrointestinal: Positive for abdominal pain. Negative for nausea and vomiting.  Genitourinary: Negative for hematuria.  Musculoskeletal: Negative for myalgias and neck pain.  Neurological: Negative for  dizziness, tingling, tremors and headaches.  Endo/Heme/Allergies: Negative for polydipsia. Bruises/bleeds easily.  Psychiatric/Behavioral: Negative for suicidal ideas and hallucinations.   Blood pressure 157/86, pulse 74, temperature 98.3 F (36.8 C), temperature source Oral, resp. rate 12, height _0  (1.6 m), SpO2 100 %. Physical Exam  Nursing note and vitals reviewed. Constitutional: She is oriented to person, place, and time. She appears well-developed and well-nourished. No distress.  HENT:  Nose: Nose normal.  Mouth/Throat: Oropharynx is clear and moist. No oropharyngeal exudate.  Neck: Normal range of motion. Neck supple. JVD present. No tracheal deviation present.  Cardiovascular: Normal rate, regular rhythm, normal heart sounds and intact distal pulses.  Exam reveals no gallop.   No murmur heard. Respiratory: Effort normal and breath sounds normal. No respiratory distress. She has no wheezes.  GI: She exhibits no distension. There is no tenderness. There is no rebound.  Musculoskeletal: Normal range of motion. She exhibits no edema.  Neurological: She is alert and oriented to person, place, and time. No cranial nerve deficit.  Skin: Skin is warm and dry. No rash noted. She is not diaphoretic. No erythema.  Psychiatric: Her behavior is normal. Thought content normal.  Flat affect   labs reviewed; trop 1.27, K 3.2 LHC 6/4 with LCx 30% prox, 99% mid LCx s/p DES, LVEDP 36 mmHg  Echo 6/4 with preserved EF, severe hypokinesis of inferolateral wall   Assessment/Plan: [name] is a 44 yo woman with PMH of ESRD on dialysis, GERD, hypertension, bilateral AKA who had PCI 02/02/15 of LCx with elevated troponin with initial presentation with abdominal discomfort who returns today with abdominal discomfort and found to have persistently elevated troponin. Differential diagnosis for elevated troponin includes demand ischemia from other stress event (i.e. sepsis), neurologic disease,  acute renal failure, among other etiologies. I favor a diagnosis of down-trending troponin in setting of recent MI/stenting with peak of 3.44 just 2 days ago. She has no chest pain, no real ECG changes Problem List Abdominal discomfort Recent PCI to LCx with DES on 1 year+ DAPT with asa/brilinta Elevated troponin - appears downtrending peak 3.4, here 1.27 Hypertension ESRD on dialysis  Pulmonary hypertension  Peripheral Vascular Disease  Plan 1. Obtain repeat troponin in 2-3 hours along with CKMB  2. If cardiac markers are flat to down-trending reasonable to discharge home 3. Make Sure she has prescription for asa/brilinta 3. Assess for other causes of abdominal discomfort        Riannon Mukherjee 02/04/2015, 11:53 PM

## 2015-02-04 NOTE — ED Notes (Signed)
Pt states that she had a stent placed on Friday and started experiencing SOB today associated with abd pain and back pain.

## 2015-02-04 NOTE — ED Notes (Signed)
Denies CP.

## 2015-02-04 NOTE — ED Notes (Signed)
Pt had dialysis today.

## 2015-02-04 NOTE — ED Notes (Signed)
Patient is resting comfortably. 

## 2015-02-05 ENCOUNTER — Encounter (HOSPITAL_COMMUNITY): Payer: Self-pay | Admitting: *Deleted

## 2015-02-05 DIAGNOSIS — Z89612 Acquired absence of left leg above knee: Secondary | ICD-10-CM | POA: Diagnosis not present

## 2015-02-05 DIAGNOSIS — I25118 Atherosclerotic heart disease of native coronary artery with other forms of angina pectoris: Secondary | ICD-10-CM

## 2015-02-05 DIAGNOSIS — I739 Peripheral vascular disease, unspecified: Secondary | ICD-10-CM | POA: Insufficient documentation

## 2015-02-05 DIAGNOSIS — I270 Primary pulmonary hypertension: Secondary | ICD-10-CM | POA: Diagnosis not present

## 2015-02-05 DIAGNOSIS — R0789 Other chest pain: Secondary | ICD-10-CM

## 2015-02-05 DIAGNOSIS — Z8614 Personal history of Methicillin resistant Staphylococcus aureus infection: Secondary | ICD-10-CM | POA: Diagnosis not present

## 2015-02-05 DIAGNOSIS — E1351 Other specified diabetes mellitus with diabetic peripheral angiopathy without gangrene: Secondary | ICD-10-CM | POA: Diagnosis not present

## 2015-02-05 DIAGNOSIS — I272 Other secondary pulmonary hypertension: Secondary | ICD-10-CM | POA: Diagnosis present

## 2015-02-05 DIAGNOSIS — I209 Angina pectoris, unspecified: Secondary | ICD-10-CM | POA: Diagnosis not present

## 2015-02-05 DIAGNOSIS — Z9861 Coronary angioplasty status: Secondary | ICD-10-CM

## 2015-02-05 DIAGNOSIS — K219 Gastro-esophageal reflux disease without esophagitis: Secondary | ICD-10-CM | POA: Diagnosis present

## 2015-02-05 DIAGNOSIS — R079 Chest pain, unspecified: Secondary | ICD-10-CM | POA: Diagnosis present

## 2015-02-05 DIAGNOSIS — Z7982 Long term (current) use of aspirin: Secondary | ICD-10-CM | POA: Diagnosis not present

## 2015-02-05 DIAGNOSIS — Z89611 Acquired absence of right leg above knee: Secondary | ICD-10-CM | POA: Diagnosis not present

## 2015-02-05 DIAGNOSIS — Z794 Long term (current) use of insulin: Secondary | ICD-10-CM | POA: Diagnosis not present

## 2015-02-05 DIAGNOSIS — E1151 Type 2 diabetes mellitus with diabetic peripheral angiopathy without gangrene: Secondary | ICD-10-CM | POA: Diagnosis present

## 2015-02-05 DIAGNOSIS — I2510 Atherosclerotic heart disease of native coronary artery without angina pectoris: Secondary | ICD-10-CM | POA: Insufficient documentation

## 2015-02-05 DIAGNOSIS — Z992 Dependence on renal dialysis: Secondary | ICD-10-CM | POA: Diagnosis not present

## 2015-02-05 DIAGNOSIS — R7989 Other specified abnormal findings of blood chemistry: Secondary | ICD-10-CM

## 2015-02-05 DIAGNOSIS — I120 Hypertensive chronic kidney disease with stage 5 chronic kidney disease or end stage renal disease: Secondary | ICD-10-CM | POA: Diagnosis present

## 2015-02-05 DIAGNOSIS — N186 End stage renal disease: Secondary | ICD-10-CM | POA: Diagnosis present

## 2015-02-05 DIAGNOSIS — Z955 Presence of coronary angioplasty implant and graft: Secondary | ICD-10-CM | POA: Diagnosis not present

## 2015-02-05 DIAGNOSIS — Z79899 Other long term (current) drug therapy: Secondary | ICD-10-CM | POA: Diagnosis not present

## 2015-02-05 LAB — CK TOTAL AND CKMB (NOT AT ARMC)
CK TOTAL: 64 U/L (ref 38–234)
CK, MB: 2.7 ng/mL (ref 0.5–5.0)
Relative Index: INVALID (ref 0.0–2.5)

## 2015-02-05 LAB — TROPONIN I
TROPONIN I: 1.13 ng/mL — AB (ref <0.031)
Troponin I: <0.03 ng/mL (ref <0.031)
Troponin I: 0.99 ng/mL (ref <0.031)

## 2015-02-05 LAB — BASIC METABOLIC PANEL
Anion gap: 15 (ref 5–15)
BUN: 20 mg/dL (ref 6–20)
CALCIUM: 9.2 mg/dL (ref 8.9–10.3)
CO2: 29 mmol/L (ref 22–32)
Chloride: 93 mmol/L — ABNORMAL LOW (ref 101–111)
Creatinine, Ser: 4.26 mg/dL — ABNORMAL HIGH (ref 0.44–1.00)
GFR calc Af Amer: 14 mL/min — ABNORMAL LOW (ref >60)
GFR calc non Af Amer: 12 mL/min — ABNORMAL LOW (ref >60)
GLUCOSE: 90 mg/dL (ref 65–99)
Potassium: 3.4 mmol/L — ABNORMAL LOW (ref 3.5–5.1)
Sodium: 137 mmol/L (ref 135–145)

## 2015-02-05 LAB — GLUCOSE, CAPILLARY
Glucose-Capillary: 108 mg/dL — ABNORMAL HIGH (ref 65–99)
Glucose-Capillary: 174 mg/dL — ABNORMAL HIGH (ref 65–99)

## 2015-02-05 LAB — MRSA PCR SCREENING: MRSA by PCR: NEGATIVE

## 2015-02-05 MED ORDER — ASPIRIN 81 MG PO CHEW
81.0000 mg | CHEWABLE_TABLET | Freq: Every day | ORAL | Status: DC
  Administered 2015-02-05: 81 mg via ORAL
  Filled 2015-02-05: qty 1

## 2015-02-05 MED ORDER — METOPROLOL TARTRATE 50 MG PO TABS
50.0000 mg | ORAL_TABLET | Freq: Two times a day (BID) | ORAL | Status: DC
  Administered 2015-02-05 (×2): 50 mg via ORAL
  Filled 2015-02-05 (×3): qty 1

## 2015-02-05 MED ORDER — ACETAMINOPHEN 650 MG RE SUPP
650.0000 mg | Freq: Four times a day (QID) | RECTAL | Status: DC | PRN
Start: 2015-02-05 — End: 2015-02-05

## 2015-02-05 MED ORDER — ONDANSETRON HCL 4 MG/2ML IJ SOLN: 4.0000 mg | Freq: Four times a day (QID) | INTRAMUSCULAR | Status: DC | PRN

## 2015-02-05 MED ORDER — INSULIN ASPART 100 UNIT/ML ~~LOC~~ SOLN
0.0000 [IU] | Freq: Three times a day (TID) | SUBCUTANEOUS | Status: DC
  Administered 2015-02-05: 2 [IU] via SUBCUTANEOUS

## 2015-02-05 MED ORDER — MORPHINE SULFATE 2 MG/ML IJ SOLN
1.0000 mg | INTRAMUSCULAR | Status: DC | PRN
Start: 2015-02-05 — End: 2015-02-05

## 2015-02-05 MED ORDER — AMLODIPINE BESYLATE 10 MG PO TABS
10.0000 mg | ORAL_TABLET | Freq: Every day | ORAL | Status: DC
  Filled 2015-02-05: qty 1

## 2015-02-05 MED ORDER — TICAGRELOR 90 MG PO TABS
90.0000 mg | ORAL_TABLET | Freq: Two times a day (BID) | ORAL | Status: DC
Start: 2015-02-05 — End: 2015-02-05
  Administered 2015-02-05 (×2): 90 mg via ORAL
  Filled 2015-02-05 (×3): qty 1

## 2015-02-05 MED ORDER — INSULIN ASPART 100 UNIT/ML ~~LOC~~ SOLN: 4.0000 [IU] | Freq: Three times a day (TID) | SUBCUTANEOUS | Status: DC

## 2015-02-05 MED ORDER — PANTOPRAZOLE SODIUM 40 MG PO TBEC
40.0000 mg | DELAYED_RELEASE_TABLET | Freq: Every day | ORAL | Status: DC
  Administered 2015-02-05: 40 mg via ORAL
  Filled 2015-02-05: qty 1

## 2015-02-05 MED ORDER — ACETAMINOPHEN 325 MG PO TABS: 650.0000 mg | ORAL_TABLET | Freq: Four times a day (QID) | ORAL | Status: DC | PRN

## 2015-02-05 MED ORDER — INSULIN ASPART PROT & ASPART (70-30 MIX) 100 UNIT/ML ~~LOC~~ SUSP: 6.0000 [IU] | Freq: Every day | SUBCUTANEOUS | Status: DC

## 2015-02-05 MED ORDER — METOCLOPRAMIDE HCL 5 MG/5ML PO SOLN
10.0000 mg | Freq: Four times a day (QID) | ORAL | Status: DC | PRN
  Filled 2015-02-05: qty 10

## 2015-02-05 MED ORDER — ONDANSETRON HCL 4 MG PO TABS: 4.0000 mg | ORAL_TABLET | Freq: Four times a day (QID) | ORAL | Status: DC | PRN

## 2015-02-05 MED ORDER — ZOLPIDEM TARTRATE 5 MG PO TABS: 5.0000 mg | ORAL_TABLET | Freq: Every evening | ORAL | Status: DC | PRN

## 2015-02-05 MED ORDER — INSULIN ASPART PROT & ASPART (70-30 MIX) 100 UNIT/ML ~~LOC~~ SUSP
10.0000 [IU] | Freq: Every day | SUBCUTANEOUS | Status: DC
  Filled 2015-02-05: qty 10

## 2015-02-05 MED ORDER — SEVELAMER CARBONATE 800 MG PO TABS
3200.0000 mg | ORAL_TABLET | Freq: Two times a day (BID) | ORAL | Status: DC
  Filled 2015-02-05 (×3): qty 4

## 2015-02-05 MED ORDER — INSULIN ASPART 100 UNIT/ML ~~LOC~~ SOLN: 0.0000 [IU] | Freq: Every day | SUBCUTANEOUS | Status: DC

## 2015-02-05 MED ORDER — CINACALCET HCL 30 MG PO TABS
90.0000 mg | ORAL_TABLET | Freq: Every day | ORAL | Status: DC
  Administered 2015-02-05: 90 mg via ORAL
  Filled 2015-02-05 (×2): qty 3

## 2015-02-05 MED ORDER — SODIUM CHLORIDE 0.9 % IJ SOLN
3.0000 mL | Freq: Two times a day (BID) | INTRAMUSCULAR | Status: DC
  Administered 2015-02-05: 3 mL via INTRAVENOUS

## 2015-02-05 MED ORDER — HYDRALAZINE HCL 50 MG PO TABS
50.0000 mg | ORAL_TABLET | Freq: Three times a day (TID) | ORAL | Status: DC
  Administered 2015-02-05 (×2): 50 mg via ORAL
  Filled 2015-02-05 (×5): qty 1

## 2015-02-05 MED ORDER — ATORVASTATIN CALCIUM 40 MG PO TABS
40.0000 mg | ORAL_TABLET | Freq: Every day | ORAL | Status: DC
  Filled 2015-02-05: qty 1

## 2015-02-05 MED ORDER — HYDROCODONE-ACETAMINOPHEN 5-325 MG PO TABS: 1.0000 | ORAL_TABLET | ORAL | Status: DC | PRN

## 2015-02-05 MED FILL — Heparin Sodium (Porcine) 2 Unit/ML in Sodium Chloride 0.9%: INTRAMUSCULAR | Qty: 1500 | Status: AC

## 2015-02-05 MED FILL — Lidocaine HCl Local Preservative Free (PF) Inj 1%: INTRAMUSCULAR | Qty: 30 | Status: AC

## 2015-02-05 NOTE — ED Notes (Signed)
MD at bedside. 

## 2015-02-05 NOTE — ED Notes (Signed)
Admitting MD at bedside.

## 2015-02-05 NOTE — Progress Notes (Signed)
Patient Troponin elevated to critical value of 1.13. No chest pain. No SOB. Patient asymptomatic. MD notified. Will continue to monitor.   Valinda HoarLexie Ovadia Lopp RN

## 2015-02-05 NOTE — H&P (Signed)
[facility]                                                                                    Patient Demographics  [name], is a 44 y.o. female  CSN: [id number]  MRN: [id number]  DOB - [birth year]  Admit Date - 02/04/2015  Outpatient Primary MD for the patient is Billee Cashing, MD   With History of -  Past Medical History  Diagnosis Date  . GERD (gastroesophageal reflux disease)   . Diabetes mellitus     IDDM  . Hypertension     states has been on med. x "a long time"  . ESRF (end stage renal failure)     dialysis M,W, F; left thigh AV graft  . History of gangrene     left foot  . Hx MRSA infection   . 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 Pneumonia 04/20/2013      Past Surgical History  Procedure Laterality Date  . Av fistula placement  10/18/2008    right thigh AV graft  . Finger debridement  06/20/2010    right middle finger  . Finger amputation  05/27/2010    right middle  . Thrombectomy / arteriovenous graft revision  01/14/2010    left superficial femoral artery; left thigh AV graft placement  . 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 Leg amputation below knee  10/11/2009    right  . Central venous catheter insertion  08/06/2009    removal right IJ Diatek cath., placement left IJ Diatek cath.  . Above knee leg amputation  05/21/2009    left  . Foot amputation  04/04/2009    left transtibial amputation  . Excision / curettage bone cyst talus / calcaneus  03/04/2009    partial calcaneal exc. left; placement wound VAC  . Groin debridement  11/16/2008    right rectus femoris muscle flap to right groin wound; right groing debridement  . Femoral endarterectomy  11/06/2008    right common femoral; embolectomy right femoral artery; removal right femoral AV graft  . Central venous catheter insertion  07/10/2008; 12/31/2006; 01/19/2006; 06/19/2005; 09/19/2004; 09/08/2004    right IJ Diatek catheter  . Av fistula repair  07/08/2008     removal left upper arm AV graft  . Thrombectomy / arteriovenous graft revision  05/11/2007;12/31/2006; 01/18/2006; 12/16/2005    left upper arm  . Dialysis fistula creation  01/20/2007    left upper arm AV graft  . Dialysis fistula creation  01/28/2006    right upper arm AV graft  . Thrombectomy / arteriovenous graft revision  10/26/2005    left forearm AV graft  . Dialysis fistula creation  10/04/2005    left forearm AV graft  . Av fistula repair  07/14/2005    removal infected right forearm AV graft  . Thrombectomy / arteriovenous graft revision  03/11/2005; 10/20/2004    right forearm AV graft  . Dialysis fistula creation  09/10/2004    right forearm AV graft  . Av fistula placement  07/18/2004    creation left AV fistula, radial to cephalic  . Finger exploration  07/13/2002    and repair left middle finger  . Carpal tunnel release  01/26/2012    Procedure: CARPAL TUNNEL RELEASE;  Surgeon: Nicki ReaperGary R Kuzma, MD;  Location:  S[facility];  Service: Orthopedics;  Laterality: Left;  . Esophagogastroduodenoscopy  06/26/2012    Procedure: ESOPHAGOGASTRODUODENOSCOPY (EGD);  Surgeon: Charna ElizabethJyothi Mann, MD;  [facility];  Service: Endoscopy;  Laterality: N/A;  . Shuntogram N/A 11/12/2011    Procedure: Betsey AmenSHUNTOGRAM;  Surgeon: Fransisco HertzBrian L Chen, MD;  [facility];  Service: Cardiovascular;  Laterality: N/A;  . Cardiac catheterization N/A 02/01/2015    Procedure: Left Heart Cath and Coronary Angiography;  Surgeon: Lennette Biharihomas A Kelly, MD;  Location: [facility] INVASIVE CV LAB;  Service: Cardiovascular;  Laterality: N/A;  . Cardiac catheterization N/A 02/01/2015    Procedure: Coronary Stent Intervention;  Surgeon: Lennette Biharihomas A Kelly, MD;  Location: [facility] INVASIVE CV LAB;  Service: Cardiovascular;  Laterality: N/A;    in for   Chief Complaint  Patient presents with  . Shortness of Breath  . Abdominal Pain  . Back Pain     HPI  [name]  is a 44 y.o. female, medical history significant for coronary  artery disease status post a recent PCI of the 99% left circumflex stenosis with insertion of drug eluded stent on 02/01/2015 presenting today with abdominal pain that started at 3 PM radiating to the back associated with shortness of breath that is similar to her presenting complaint when she had the stent placed. Reports nausea but no vomiting no frank chest pain. Her troponin was noted to be elevated in the emergency room but it seems that it is trending down. The patient has a history of end-stage renal disease on hemodialysis and had an uneventful dialysis episode today early.    Review of Systems    In addition to the HPI above,  No Fever-chills, No Headache, No changes with Vision or hearing, No problems swallowing food or Liquids, No Blood in stool or Urine, No dysuria, No new skin rashes or bruises, No new joints pains-aches,  No new weakness, tingling, numbness in any extremity, No recent weight gain or loss, No polyuria, polydypsia or polyphagia, No significant Mental Stressors.  A full 10 point Review of Systems was done, except as stated above, all other Review of Systems were negative.   Social History History  Substance Use Topics  . Smoking status: Never Smoker   . Smokeless tobacco: Never Used  . Alcohol Use: No     Family History Family History  Problem Relation Age of Onset  . Diabetes Mother   . Heart disease Mother   . Hypertension Mother   . Heart attack Mother [id number]  . Diabetes Sister   . Hypertension Brother   . Heart attack Brother      Prior to Admission medications   Medication Sig Start Date End Date Taking? Authorizing Provider  amLODipine (NORVASC) 10 MG tablet Take 10 mg by mouth at bedtime.   Yes Historical Provider, MD  aspirin 81 MG chewable tablet Chew 81 mg by mouth daily.   Yes Historical Provider, MD  atorvastatin (LIPITOR) 40 MG tablet Take 1 tablet (40 mg total) by mouth daily at 6 PM. 02/02/15  Yes Calvert CantorSaima Rizwan, MD  cinacalcet  (SENSIPAR) 90 MG tablet Take 90 mg by mouth daily.   Yes Historical Provider, MD  hydrALAZINE (APRESOLINE) 50 MG tablet Take 1 tablet (50 mg total) by mouth every 8 (eight) hours. 02/03/15  Yes Saima  Rizwan, MD  insulin aspart protamine-insulin aspart (NOVOLOG 70/30) (70-30) 100 UNIT/ML injection Inject 6-10 Units into the skin 2 (two) times daily with a meal. Take 10 units in the morning and 6 units in the evening   Yes Historical Provider, MD  metoCLOPramide (REGLAN) 5 MG/5ML solution Take 10 mLs (10 mg total) by mouth every 6 (six) hours as needed for nausea or vomiting. 02/03/15  Yes Calvert Cantor, MD  metoprolol (LOPRESSOR) 50 MG tablet Take 1 tablet (50 mg total) by mouth 2 (two) times daily. 02/03/15  Yes Calvert Cantor, MD  NOVOLOG 100 UNIT/ML injection Inject 4-10 Units into the skin 3 (three) times daily with meals.  01/02/15  Yes Historical Provider, MD  omeprazole (PRILOSEC) 20 MG capsule Take 20 mg by mouth 2 (two) times daily.   Yes Historical Provider, MD  sevelamer (RENVELA) 800 MG tablet Take 3,200 mg by mouth 2 (two) times daily with a meal.    Yes Historical Provider, MD  ticagrelor (BRILINTA) 90 MG TABS tablet Take 1 tablet (90 mg total) by mouth 2 (two) times daily. 02/02/15  Yes Calvert Cantor, MD    Allergies  Allergen Reactions  . Tramadol Itching  . Percocet [Oxycodone-Acetaminophen] Other (See Comments)    GI UPSET  . Soap Itching    IVORY SOAP    Physical Exam  Vitals  Blood pressure 128/75, pulse 77, temperature 98.3 F (36.8 C), temperature source Oral, resp. rate 8, height  (1.6 m), SpO2 100 %.   1. General a very pleasant female in no acute distress at the moment  2. Normal affect and insight, Not Suicidal or Homicidal, Awake Alert, Oriented X 3.  3. No F.N deficits , grossly, ALL C.Nerves Intact,   4. Ears and Eyes appear Normal, Conjunctivae clear, PERRLA. Moist Oral Mucosa.  5. Supple Neck, No JVD, No cervical lymphadenopathy appriciated, No Carotid  Bruits.  6. Symmetrical Chest wall movement, Good air movement bilaterally, CTAB.  7. RRR, No Gallops, Rubs or Murmurs, No Parasternal Heave.  8. Positive Bowel Sounds, Abdomen Soft, Non tender, No organomegaly appriciated,No rebound -guarding or rigidity.  9.  No Cyanosis, Normal Skin Turgor, No Skin Rash or Bruise.  10. Good muscle tone, status post bilateral above-knee amputations.    Data Review  CBC  Recent Labs Lab 02/01/15 0148 02/02/15 0320 02/03/15 0706 02/04/15 2114  WBC 15.5* 15.1* 14.5* 11.9*  HGB 11.5* 10.5* 10.8* 12.0  HCT 34.7* 30.6* 32.0* 35.5*  PLT 282 393 316 394  MCV 86.3 83.8 84.0 83.5  MCH 28.6 28.8 28.3 28.2  MCHC 33.1 34.3 33.8 33.8  RDW 15.9* 16.0* 15.9* 16.0*  LYMPHSABS 1.0  --   --  1.5  MONOABS 0.8  --   --  0.8  EOSABS 0.1  --   --  0.0  BASOSABS 0.0  --   --  0.0   ------------------------------------------------------------------------------------------------------------------  Chemistries   Recent Labs Lab 02/01/15 0148 02/02/15 0320 02/04/15 2114  NA 136 138 138  K 3.5 4.1 3.2*  CL 93* 93* 94*  CO2 GLUCOSE 330* 242* 92  BUN CREATININE 5.83* 4.61* 3.28*  CALCIUM 9.8 9.1 9.0  AST 18  --  15  ALT 9*  --  9*  ALKPHOS 65  --  66  BILITOT 0.5  --  1.2   ------------------------------------------------------------------------------------------------------------------ estimated creatinine clearance is 20.9 mL/min (by C-G formula based on Cr of 3.28). ------------------------------------------------------------------------------------------------------------------ No results for input(s): TSH,  T4TOTAL, T3FREE, THYROIDAB in the last 72 hours.  Invalid input(s): FREET3   Coagulation profile  Recent Labs Lab 02/01/15 1111  INR 1.11   ------------------------------------------------------------------------------------------------------------------- No results for input(s): DDIMER in the last 72  hours. -------------------------------------------------------------------------------------------------------------------  Cardiac Enzymes  Recent Labs Lab 02/01/15 0521 02/01/15 1111 02/01/15 2040 02/04/15 2311  CKMB  --   --   --  2.7  TROPONINI 2.22* 2.20* 3.44*  --    ------------------------------------------------------------------------------------------------------------------ Invalid input(s): POCBNP   ---------------------------------------------------------------------------------------------------------------  Urinalysis    Component Value Date/Time   COLORURINE YELLOW 06/20/2008 0827   APPEARANCEUR TURBID* 06/20/2008 0827   LABSPEC 1.025 06/20/2008 0827   PHURINE 7.5 06/20/2008 0827   GLUCOSEU NEGATIVE 06/20/2008 0827   HGBUR LARGE* 06/20/2008 0827   BILIRUBINUR SMALL* 06/20/2008 0827   KETONESUR 15* 06/20/2008 0827   PROTEINUR >300* 06/20/2008 0827   UROBILINOGEN 0.2 06/20/2008 0827   NITRITE NEGATIVE 06/20/2008 0827   LEUKOCYTESUR LARGE* 06/20/2008 0827    ----------------------------------------------------------------------------------------------------------------   Imaging results:   Dg Chest Port 1 View  02/04/2015   CLINICAL DATA:  Shortness of breath for 1 day, recent cardiac stent placement  EXAM: PORTABLE CHEST - 1 VIEW  COMPARISON:  02/03/2015  FINDINGS: Cardiac shadow is within normal limits. Lungs are well aerated bilaterally. The previously seen changes have nearly completely resolved. Some minimal residual changes are noted in the left base. No other focal abnormality is seen.  IMPRESSION: Near complete resolution of previously seen bilateral infiltrative change. Some minimal residual changes in the left base are noted.   Electronically Signed   By: Alcide Clever M.D.   On: 02/04/2015 22:16   Dg Chest Port 1 View  02/03/2015   CLINICAL DATA:  Hypoxia.  EXAM: PORTABLE CHEST - 1 VIEW  COMPARISON:  One-view chest 02/02/2015.  FINDINGS: The heart  is mildly enlarged. This is exaggerated by low lung volumes. Aeration is improving. Persistent bibasilar airspace disease is noted, left greater than right.  IMPRESSION: 1. Left greater than right basilar airspace disease is improving. While this may represent atelectasis, infection is also considered. 2. Decreasing interstitial edema.   Electronically Signed   By: Marin Roberts M.D.   On: 02/03/2015 09:24   Dg Chest Port 1 View  02/02/2015   CLINICAL DATA:  Follow-up pulmonary edema  EXAM: PORTABLE CHEST - 1 VIEW  COMPARISON:  05/19/2013; 04/21/2013 ; 06/27/2012  FINDINGS: Grossly unchanged enlarged cardiac silhouette and mediastinal contours. The pulmonary vasculature appears less distinct than present examination with cephalization of flow. Interval development of trace bilateral effusions with associated worsening bibasilar opacities, likely atelectasis. No pneumothorax. No acute osseus abnormalities.  IMPRESSION: Findings suggestive of worsening pulmonary edema with development of trace bilateral effusions and worsening bibasilar opacities, likely atelectasis.   Electronically Signed   By: Simonne Come M.D.   On: 02/02/2015 13:21   Dg Abd Portable 1v  02/03/2015   CLINICAL DATA:  Right-sided abdominal pain.  Nausea and vomiting.  EXAM: PORTABLE ABDOMEN - 1 VIEW  COMPARISON:  CT of the abdomen and pelvis 06/23/2012.  FINDINGS: A relatively gasless abdomen is present. There is no obstruction or free air. Atherosclerotic calcifications are again noted within the bones Derek arteries. Calcified uterine fibroids are evident. The axial skeleton is unremarkable. The heart is mildly enlarged. Lung bases are clear.  IMPRESSION: 1. Nonspecific bowel gas pattern without evidence for obstruction or free air. There is a relative paucity of gas in the abdomen, a nonspecific finding. 2. Atherosclerosis. 3. Borderline cardiomegaly  without failure.   Electronically Signed   By: Marin Roberts M.D.   On:  02/03/2015 09:31    My personal review of EKG: Rhythm NSR, at 72 bpm with prolonged QTC and nonspecific T-wave changes  Personally reviewed Old Chart from 6/3  Assessment & Plan  1. Epigastric pain associated with nausea and radiating to the back;? Anginal equivalent 2. Coronary artery disease status post stent placement in the circumflex artery, recent; status post STEMI 3. Pulmonary hypertension 4. Essential hypertension 5. Diabetes mellitus with peripheral vascular disease and status post bilateral above-knee amputation  Plan Cardiology consulted and they advised admission for monitoring and serial troponin follow-up Continue same medications   DVT Prophylaxis Brillinta  AM Labs Ordered, also please review Full Orders    Code Status full  Disposition Plan: Home  Time spent in minutes : 38 minutes  Condition GUARDED   @SIGNATURE @

## 2015-02-05 NOTE — Progress Notes (Signed)
    Subjective:  She feels well, no further discomfort. No abdominal pain. No chest pain.  Objective:  Vital Signs in the last 24 hours: Temp:  [98 F (36.7 C)-98.3 F (36.8 C)] 98 F (36.7 C) (06/07 1318) Pulse Rate:  [70-81] 70 (06/07 1318) Resp:  [8-21] 20 (06/07 1318) BP: (123-159)/(56-86) 128/70 mmHg (06/07 1318) SpO2:  [91 %-100 %] 98 % (06/07 1318) Weight:  [149 lb 14.6 oz (68 kg)] 149 lb 14.6 oz (68 kg) (06/07 0205)  Intake/Output from previous day:     Physical Exam: General: Well developed, well nourished, in no acute distress. Head:  Normocephalic and atraumatic. Lungs: Clear to auscultation and percussion. Heart: Normal S1 and S2.  No murmur, rubs or gallops.  Abdomen: soft, non-tender, positive bowel sounds. Extremities: No clubbing or cyanosis. No edema. Bilateral AKA's Neurologic: Alert and oriented x 3.    Lab Results:  Recent Labs  02/03/15 0706 02/04/15 2114  WBC 14.5* 11.9*  HGB 10.8* 12.0  PLT 316 394    Recent Labs  02/04/15 2114 02/05/15 0846  NA 138 137  K 3.2* 3.4*  CL 94* 93*  CO2 26 29  GLUCOSE 92 90  BUN 16 20  CREATININE 3.28* 4.26*    Recent Labs  02/05/15 0232 02/05/15 0846  TROPONINI <0.03 1.13*   Hepatic Function Panel  Recent Labs  02/04/15 2114  PROT 8.1  ALBUMIN 3.5  AST 15  ALT 9*  ALKPHOS 66  BILITOT 1.2   No results for input(s): CHOL in the last 72 hours. No results for input(s): PROTIME in the last 72 hours.  Imaging: Dg Chest Port 1 View  02/04/2015   CLINICAL DATA:  Shortness of breath for 1 day, recent cardiac stent placement  EXAM: PORTABLE CHEST - 1 VIEW  COMPARISON:  02/03/2015  FINDINGS: Cardiac shadow is within normal limits. Lungs are well aerated bilaterally. The previously seen changes have nearly completely resolved. Some minimal residual changes are noted in the left base. No other focal abnormality is seen.  IMPRESSION: Near complete resolution of previously seen bilateral infiltrative  change. Some minimal residual changes in the left base are noted.   Electronically Signed   By: Alcide CleverMark  Lukens M.D.   On: 02/04/2015 22:16   Personally viewed.   Telemetry: No adverse arrhythmias Personally viewed.  LHC 6/4 with LCx 30% prox, 99% mid LCx s/p DES, LVEDP 36 mmHg  Echo 6/4 with preserved EF, severe hypokinesis of inferolateral wall   EKG:  T-wave inversion noted inferior leads as well as early anterior leads. No significant change from prior EKG.  Cardiac Studies:  As above  Assessment/Plan:   44 year old female with severe peripheral vascular disease status post AKA bilaterally with end-stage renal disease, coronary disease with recent stent placement to circumflex artery several days ago with elevated troponin. Came in with abdominal discomfort.  Abdominal discomfort Coronary artery disease with recent PCI to left circumflex with DES on dual antiplatelets therapy Elevated troponin-peak was 3.4 currently 1.13. Her point-of-care troponin in the emergency room was 1.27, trending downward. I am unsure why the 2 AM troponin was less than 0.03. This does not correlate with a natural trajectory for troponin decrease after an event. Reassuringly, her CK was 64 and MB was 2.7.  She is chest pain-free, feeling well and desires to go home. I'm comfortable with her being discharged. Continue with follow-up as previously appointed.    SKAINS, MARK 02/05/2015, 2:20 PM

## 2015-02-07 NOTE — Discharge Summary (Signed)
Physician Discharge Summary  [name]:[id number] DOB: 1971/08/24 DOA: 02/04/2015  PCP: Billee Cashing, MD  Admit date: 02/04/2015 Discharge date: 02/07/2015  Time spent: > 30 minutes  Recommendations for Outpatient Follow-up:  1. Follow up with Dr. Ronne Binning as needed 2. HD as outpatient 3. Follow up with cardiology 1-2 weeks  Discharge Diagnoses:  Active Problems:   Chest pain   Elevated troponin   Coronary artery disease due to lipid rich plaque   PVD (peripheral vascular disease)  Discharge Condition: stable  Diet recommendation: renal, heart healthy  Filed Weights   02/05/15 0205  Weight: 68 kg (149 lb 14.6 oz)   History of present illness:   [name] is a 44 y.o. female, medical history significant for coronary artery disease status post a recent PCI of the 99% left circumflex stenosis with insertion of drug eluded stent on 02/01/2015 presenting today with abdominal pain that started at 3 PM radiating to the back associated with shortness of breath that is similar to her presenting complaint when she had the stent placed. Reports nausea but no vomiting no frank chest pain. Her troponin was noted to be elevated in the emergency room but it seems that it is trending down. The patient has a history of end-stage renal disease on hemodialysis and had an uneventful dialysis episode today early.  Hospital Course:  Patient was admitted to the hospital with epigastric pain thought to be an anginal equivalent, she is s/p PCI just 3 days ago. Her pain shortly resolved after admission and has not recurred. Cardiology was consulted and have followed patient while hospitalized, and she was continued with medical management as her troponin on presentation and subsequent troponin levels were trending down from her peak during her last hospitalization. She had a troponin < 0.03 in between elevated levels which is likely an error as it doesn't correlate with natural trajectory for  troponin decrease. She is chest pain free, wished to go home and was discharged after cardiology cleared her. She is to follow up as an outpatient and to resume her HD as scheduled.   Procedures:  None   Consultations:  Cardiology   Discharge Exam: Filed Vitals:   02/05/15 0115 02/05/15 0130 02/05/15 0205 02/05/15 1318  BP: 128/75 123/56 126/76 128/70  Pulse: 77 81 76 70  Temp:   98.2 F (36.8 C) 98 F (36.7 C)  TempSrc:   Oral Oral  Resp: 8 20 21 20   Height:      Weight:   68 kg (149 lb 14.6 oz)   SpO2: 100% 99% 98% 98%    General: NAD Cardiovascular: RRR Respiratory: CTA biL  Discharge Instructions     Medication List    TAKE these medications        amLODipine 10 MG tablet  Commonly known as:  NORVASC  Take 10 mg by mouth at bedtime.     aspirin 81 MG chewable tablet  Chew 81 mg by mouth daily.     atorvastatin 40 MG tablet  Commonly known as:  LIPITOR  Take 1 tablet (40 mg total) by mouth daily at 6 PM.     cinacalcet 90 MG tablet  Commonly known as:  SENSIPAR  Take 90 mg by mouth daily.     hydrALAZINE 50 MG tablet  Commonly known as:  APRESOLINE  Take 1 tablet (50 mg total) by mouth every 8 (eight) hours.     insulin aspart protamine- aspart (70-30) 100 UNIT/ML injection  Commonly  known as:  NOVOLOG MIX 70/30  Inject 6-10 Units into the skin 2 (two) times daily with a meal. Take 10 units in the morning and 6 units in the evening     metoCLOPramide 5 MG/5ML solution  Commonly known as:  REGLAN  Take 10 mLs (10 mg total) by mouth every 6 (six) hours as needed for nausea or vomiting.     metoprolol 50 MG tablet  Commonly known as:  LOPRESSOR  Take 1 tablet (50 mg total) by mouth 2 (two) times daily.     NOVOLOG 100 UNIT/ML injection  Generic drug:  insulin aspart  Inject 4-10 Units into the skin 3 (three) times daily with meals.     omeprazole 20 MG capsule  Commonly known as:  PRILOSEC  Take 20 mg by mouth 2 (two) times daily.      sevelamer carbonate 800 MG tablet  Commonly known as:  RENVELA  Take 3,200 mg by mouth 2 (two) times daily with a meal.     ticagrelor 90 MG Tabs tablet  Commonly known as:  BRILINTA  Take 1 tablet (90 mg total) by mouth 2 (two) times daily.           Follow-up Information    Follow up with Billee Cashing, MD. Schedule an appointment as soon as possible for a visit in 1 month.   Specialty:  Family Medicine   Contact information:   [address] [place] [address] [phone]       Follow up with Donato Schultz, MD. Schedule an appointment as soon as possible for a visit in 2 weeks.   Specialty:  Cardiology   Contact information:   [address] [place] [address] [phone]       The results of significant diagnostics from this hospitalization (including imaging, microbiology, ancillary and laboratory) are listed below for reference.    Significant Diagnostic Studies: Dg Chest Port 1 View  02/04/2015   CLINICAL DATA:  Shortness of breath for 1 day, recent cardiac stent placement  EXAM: PORTABLE CHEST - 1 VIEW  COMPARISON:  02/03/2015  FINDINGS: Cardiac shadow is within normal limits. Lungs are well aerated bilaterally. The previously seen changes have nearly completely resolved. Some minimal residual changes are noted in the left base. No other focal abnormality is seen.  IMPRESSION: Near complete resolution of previously seen bilateral infiltrative change. Some minimal residual changes in the left base are noted.   Electronically Signed   By: Alcide Clever M.D.   On: 02/04/2015 22:16   Dg Chest Port 1 View  02/03/2015   CLINICAL DATA:  Hypoxia.  EXAM: PORTABLE CHEST - 1 VIEW  COMPARISON:  One-view chest 02/02/2015.  FINDINGS: The heart is mildly enlarged. This is exaggerated by low lung volumes. Aeration is improving. Persistent bibasilar airspace disease is noted, left greater than right.  IMPRESSION: 1. Left greater than right basilar airspace  disease is improving. While this may represent atelectasis, infection is also considered. 2. Decreasing interstitial edema.   Electronically Signed   By: Marin Roberts M.D.   On: 02/03/2015 09:24   Dg Chest Port 1 View  02/02/2015   CLINICAL DATA:  Follow-up pulmonary edema  EXAM: PORTABLE CHEST - 1 VIEW  COMPARISON:  05/19/2013; 04/21/2013 ; 06/27/2012  FINDINGS: Grossly unchanged enlarged cardiac silhouette and mediastinal contours. The pulmonary vasculature appears less distinct than present examination with cephalization of flow. Interval development of trace bilateral effusions with associated worsening bibasilar  opacities, likely atelectasis. No pneumothorax. No acute osseus abnormalities.  IMPRESSION: Findings suggestive of worsening pulmonary edema with development of trace bilateral effusions and worsening bibasilar opacities, likely atelectasis.   Electronically Signed   By: Simonne Come M.D.   On: 02/02/2015 13:21   Dg Abd Portable 1v  02/03/2015   CLINICAL DATA:  Right-sided abdominal pain.  Nausea and vomiting.  EXAM: PORTABLE ABDOMEN - 1 VIEW  COMPARISON:  CT of the abdomen and pelvis 06/23/2012.  FINDINGS: A relatively gasless abdomen is present. There is no obstruction or free air. Atherosclerotic calcifications are again noted within the bones Derek arteries. Calcified uterine fibroids are evident. The axial skeleton is unremarkable. The heart is mildly enlarged. Lung bases are clear.  IMPRESSION: 1. Nonspecific bowel gas pattern without evidence for obstruction or free air. There is a relative paucity of gas in the abdomen, a nonspecific finding. 2. Atherosclerosis. 3. Borderline cardiomegaly without failure.   Electronically Signed   By: Marin Roberts M.D.   On: 02/03/2015 09:31    Microbiology: Recent Results (from the past 240 hour(s))  MRSA PCR Screening     Status: None   Collection Time: 02/01/15 10:37 AM  Result Value Ref Range Status   MRSA by PCR NEGATIVE NEGATIVE  Final    Comment:        The GeneXpert MRSA Assay (FDA approved for NASAL specimens only), is one component of a comprehensive MRSA colonization surveillance program. It is not intended to diagnose MRSA infection nor to guide or monitor treatment for MRSA infections.   MRSA PCR Screening     Status: None   Collection Time: 02/05/15  2:48 AM  Result Value Ref Range Status   MRSA by PCR NEGATIVE NEGATIVE Final    Comment:        The GeneXpert MRSA Assay (FDA approved for NASAL specimens only), is one component of a comprehensive MRSA colonization surveillance program. It is not intended to diagnose MRSA infection nor to guide or monitor treatment for MRSA infections.      Labs: Basic Metabolic Panel:  Recent Labs Lab 02/01/15 0148 02/02/15 0320 02/04/15 2114 02/05/15 0846  NA 136 138 138 137  K 3.5 4.1 3.2* 3.4*  CL 93* 93* 94* 93*  CO2 GLUCOSE 330* 242* 92 90  BUN CREATININE 5.83* 4.61* 3.28* 4.26*  CALCIUM 9.8 9.1 9.0 9.2   Liver Function Tests:  Recent Labs Lab 02/01/15 0148 02/04/15 2114  AST 18 15  ALT 9* 9*  ALKPHOS 65 66  BILITOT 0.5 1.2  PROT 8.2* 8.1  ALBUMIN 3.5 3.5    Recent Labs Lab 02/01/15 0148 02/04/15 2114  LIPASE 19* 99*   CBC:  Recent Labs Lab 02/01/15 0148 02/02/15 0320 02/03/15 0706 02/04/15 2114  WBC 15.5* 15.1* 14.5* 11.9*  NEUTROABS 13.6*  --   --  9.5*  HGB 11.5* 10.5* 10.8* 12.0  HCT 34.7* 30.6* 32.0* 35.5*  MCV 86.3 83.8 84.0 83.5  PLT 282 393 316 394   Cardiac Enzymes:  Recent Labs Lab 02/01/15 1111 02/01/15 2040 02/04/15 2311 02/05/15 0232 02/05/15 0846 02/05/15 1405  CKTOTAL  --   --  64  --   --   --   CKMB  --   --  2.7  --   --   --   TROPONINI 2.20* 3.44*  --  <0.03 1.13* 0.99*   CBG:  Recent Labs Lab 02/02/15 2008 02/03/15 1610  02/03/15 1207 02/05/15 0558 02/05/15 1227  GLUCAP 288* 373* 329* 108* 174*       Signed:  Pamella Pert  [facility] 02/07/2015, 6:35 AM

## 2015-03-06 ENCOUNTER — Ambulatory Visit (INDEPENDENT_AMBULATORY_CARE_PROVIDER_SITE_OTHER): Payer: Medicare Other | Admitting: Cardiology

## 2015-03-06 ENCOUNTER — Encounter: Payer: Self-pay | Admitting: Cardiology

## 2015-03-06 VITALS — BP 114/66 | HR 82 | Ht 63.0 in | Wt 149.9 lb

## 2015-03-06 DIAGNOSIS — I10 Essential (primary) hypertension: Secondary | ICD-10-CM

## 2015-03-06 DIAGNOSIS — I214 Non-ST elevation (NSTEMI) myocardial infarction: Secondary | ICD-10-CM

## 2015-03-06 DIAGNOSIS — Z9861 Coronary angioplasty status: Secondary | ICD-10-CM

## 2015-03-06 DIAGNOSIS — IMO0001 Reserved for inherently not codable concepts without codable children: Secondary | ICD-10-CM

## 2015-03-06 DIAGNOSIS — I2510 Atherosclerotic heart disease of native coronary artery without angina pectoris: Secondary | ICD-10-CM

## 2015-03-06 DIAGNOSIS — E118 Type 2 diabetes mellitus with unspecified complications: Secondary | ICD-10-CM

## 2015-03-06 DIAGNOSIS — Z794 Long term (current) use of insulin: Secondary | ICD-10-CM

## 2015-03-06 DIAGNOSIS — N186 End stage renal disease: Secondary | ICD-10-CM | POA: Diagnosis not present

## 2015-03-06 DIAGNOSIS — Z992 Dependence on renal dialysis: Secondary | ICD-10-CM

## 2015-03-06 DIAGNOSIS — E108 Type 1 diabetes mellitus with unspecified complications: Secondary | ICD-10-CM | POA: Diagnosis not present

## 2015-03-06 NOTE — Progress Notes (Signed)
03/06/2015 [name]   Sep 06, 1970  [id number]  Primary Physician Billee Cashing, MD Primary Cardiologist: Dr Tresa Endo  HPI:  44 y/o AA female with a history of IDDM and ESRD, presented 02/01/15 with nausea and vomiting and ruled in for a NSTEMI. Cath revealed single vessel disease of the CFX treated with a DES., Echo showed an EF of 55%. She was actually admitted 48 hrs later with similar symptoms and an elevated Troponin but it was determined her Troponin was falling. She is in the office today with her daughter for follow up. She denies any further episodes of N/V. She never had chest pain.    Current Outpatient Prescriptions  Medication Sig Dispense Refill  . amLODipine (NORVASC) 10 MG tablet Take 10 mg by mouth at bedtime.    Marland Kitchen aspirin 81 MG chewable tablet Chew 81 mg by mouth daily.    Marland Kitchen atorvastatin (LIPITOR) 40 MG tablet Take 1 tablet (40 mg total) by mouth daily at 6 PM. 30 tablet 0  . cinacalcet (SENSIPAR) 90 MG tablet Take 90 mg by mouth daily.    . hydrALAZINE (APRESOLINE) 50 MG tablet Take 1 tablet (50 mg total) by mouth every 8 (eight) hours. 90 tablet 0  . insulin aspart protamine-insulin aspart (NOVOLOG 70/30) (70-30) 100 UNIT/ML injection Inject 6-10 Units into the skin 2 (two) times daily with a meal. Take 10 units in the morning and 6 units in the evening    . metoCLOPramide (REGLAN) 5 MG/5ML solution Take 10 mLs (10 mg total) by mouth every 6 (six) hours as needed for nausea or vomiting. 120 mL 0  . metoprolol (LOPRESSOR) 50 MG tablet Take 1 tablet (50 mg total) by mouth 2 (two) times daily. 60 tablet 0  . NOVOLOG 100 UNIT/ML injection Inject 4-10 Units into the skin 3 (three) times daily with meals.   6  . omeprazole (PRILOSEC) 20 MG capsule Take 20 mg by mouth 2 (two) times daily.    . sevelamer (RENVELA) 800 MG tablet Take 3,200 mg by mouth 2 (two) times daily with a meal.     . ticagrelor (BRILINTA) 90 MG TABS tablet Take 1 tablet (90 mg total) by mouth 2 (two)  times daily. 60 tablet 0   No current facility-administered medications for this visit.    Allergies  Allergen Reactions  . Tramadol Itching  . Percocet [Oxycodone-Acetaminophen] Other (See Comments)    GI UPSET  . Soap Itching    IVORY SOAP    History   Social History  . Marital Status: Single    Spouse Name: N/A  . Number of Children: N/A  . Years of Education: N/A   Occupational History  . Not on file.   Social History Main Topics  . Smoking status: Never Smoker   . Smokeless tobacco: Never Used  . Alcohol Use: No  . Drug Use: No  . Sexual Activity: Not on file   Other Topics Concern  . Not on file   Social History Narrative     Review of Systems: General: negative for chills, fever, night sweats or weight changes.  Cardiovascular: negative for chest pain, dyspnea on exertion, edema, orthopnea, palpitations, paroxysmal nocturnal dyspnea or shortness of breath Dermatological: negative for rash Respiratory: negative for cough or wheezing Urologic: negative for hematuria Abdominal: negative for nausea, vomiting, diarrhea, bright red blood per rectum, melena, or hematemesis Neurologic: negative for visual changes, syncope, or dizziness All other systems reviewed and are otherwise negative except as  noted above.    Blood pressure 114/66, pulse 82, height 5\' 3"  (1.6 m), weight 149 lb 14.6 oz (68 kg).  General appearance: alert, cooperative and no distress Neck: no carotid bruit and no JVD Lungs: clear to auscultation bilaterally Heart: regular rate and rhythm Extremities: bilat AKA Skin: cool and dry Neurologic: Grossly normal  EKG NSR, lateral TWI, prolonged QTc-500  ASSESSMENT AND PLAN:   NSTEMI (non-ST elevated myocardial infarction) Presented with nausea and vomiting 02/01/15 (no chest pain), no recurrent N/V.  CAD S/P CFX DES 02/01/15 Single vessel disease  ESRD on dialysis Dr Deterding follows. HD at [facility],  MWF  Insulin dependent diabetes  mellitus with complications ESRD, PVD, s/p bilat AKA  Essential hypertension Controlled   PLAN  Same cardiac Rx. F/U Dr Tresa Endo 6  months.  Abelino Derrick PA-C 03/06/2015 4:43 PM

## 2015-03-06 NOTE — Patient Instructions (Signed)
Your physician wants you to follow-up in: 6 Months You will receive a reminder letter in the mail two months in advance. If you don't receive a letter, please call our office to schedule the follow-up appointment.  

## 2015-03-06 NOTE — Assessment & Plan Note (Signed)
Controlled.  

## 2015-03-06 NOTE — Assessment & Plan Note (Signed)
Single vessel disease

## 2015-03-06 NOTE — Assessment & Plan Note (Signed)
Dr Deterding follows. [facility]

## 2015-03-06 NOTE — Assessment & Plan Note (Signed)
Presented with nausea and vomiting 02/01/15 (no chest pain), no recurrent N/V.

## 2015-03-06 NOTE — Assessment & Plan Note (Signed)
ESRD, PVD, s/p bilat AKA

## 2015-03-11 ENCOUNTER — Other Ambulatory Visit: Payer: Self-pay | Admitting: Internal Medicine

## 2015-04-02 ENCOUNTER — Other Ambulatory Visit: Payer: Self-pay | Admitting: Internal Medicine

## 2015-04-15 ENCOUNTER — Other Ambulatory Visit: Payer: Self-pay | Admitting: Orthopedic Surgery

## 2015-05-02 DIAGNOSIS — G5610 Other lesions of median nerve, unspecified upper limb: Secondary | ICD-10-CM

## 2015-05-02 HISTORY — DX: Other lesions of median nerve, unspecified upper limb: G56.10

## 2015-05-17 ENCOUNTER — Encounter (HOSPITAL_BASED_OUTPATIENT_CLINIC_OR_DEPARTMENT_OTHER): Payer: Self-pay | Admitting: *Deleted

## 2015-05-17 NOTE — Pre-Procedure Instructions (Signed)
History discussed with Dr. Ivin Booty; need to know if Dr. Merlyn Lot wants pt. to come off Brilinta prior to surgery; if he does, he will need to consult with cardiologist.  Gladstone Lighter at Dr. Merrilee Seashore office notified of same.

## 2015-05-20 ENCOUNTER — Telehealth: Payer: Self-pay | Admitting: Cardiovascular Disease

## 2015-05-20 NOTE — Telephone Encounter (Signed)
Patient saw Hinda Glatter, PA post procedure in July, due to see Dr. Tresa Endo January 2017.

## 2015-05-20 NOTE — Telephone Encounter (Signed)
Note faxed to the [facility] [place] Attn: Ailene Ravel, RN @ [phone]

## 2015-05-20 NOTE — Telephone Encounter (Signed)
MD aware of clearance

## 2015-05-20 NOTE — Telephone Encounter (Signed)
Pt had a DES stent inserted on 02/01/2015 in setting of a NSTEMI. Too early to hold brilinta; favor deferring surgery presantly. I have not yet seen pt in office post procedure; schedule for f/u ov

## 2015-05-20 NOTE — Telephone Encounter (Signed)
Spoke with Larita Fife at [facility]. Patient is scheduled for hand surgery on Thursday 9/22 and patient needs clearance for procedure and OK to hold Brilinta & ASA. Larita Fife said this form has been faxed over multiple times, the last being on 9/16. No forms in MD box to be signed and no clearance forms were available for MD to sign on 9/16 when he was in the office on 9/16.   Larita Fife will refax surgical clearance request.

## 2015-05-20 NOTE — Telephone Encounter (Signed)
Clearance form received. Dr. Tresa Endo paged.

## 2015-05-23 ENCOUNTER — Ambulatory Visit (HOSPITAL_BASED_OUTPATIENT_CLINIC_OR_DEPARTMENT_OTHER): Admission: RE | Admit: 2015-05-23 | Payer: Medicare Other | Source: Ambulatory Visit | Admitting: Orthopedic Surgery

## 2015-05-23 HISTORY — DX: Anodontia: K00.0

## 2015-05-23 HISTORY — DX: Acquired absence of left leg above knee: Z89.612

## 2015-05-23 HISTORY — DX: Other reduced mobility: Z74.09

## 2015-05-23 HISTORY — DX: Other lesions of median nerve, unspecified upper limb: G56.10

## 2015-05-23 HISTORY — DX: Atherosclerotic heart disease of native coronary artery without angina pectoris: I25.10

## 2015-05-23 HISTORY — DX: Pure hypercholesterolemia, unspecified: E78.00

## 2015-05-23 HISTORY — DX: Complete loss of teeth, unspecified cause, unspecified class: K08.109

## 2015-05-23 HISTORY — DX: Acquired absence of right leg above knee: Z89.611

## 2015-05-23 SURGERY — EXPLORATION, NERVE
Anesthesia: Monitor Anesthesia Care | Laterality: Right

## 2015-05-28 ENCOUNTER — Ambulatory Visit (INDEPENDENT_AMBULATORY_CARE_PROVIDER_SITE_OTHER): Payer: Medicare Other | Admitting: Physician Assistant

## 2015-05-28 ENCOUNTER — Encounter: Payer: Self-pay | Admitting: Physician Assistant

## 2015-05-28 VITALS — BP 110/76 | HR 68

## 2015-05-28 DIAGNOSIS — I10 Essential (primary) hypertension: Secondary | ICD-10-CM | POA: Diagnosis not present

## 2015-05-28 DIAGNOSIS — Z9861 Coronary angioplasty status: Secondary | ICD-10-CM

## 2015-05-28 DIAGNOSIS — I2510 Atherosclerotic heart disease of native coronary artery without angina pectoris: Secondary | ICD-10-CM | POA: Diagnosis not present

## 2015-05-28 NOTE — Patient Instructions (Signed)
Continue same medications   Cannot have surgery now cannot stop Brilinta   Your physician recommends that you schedule a follow-up appointment with Dr.Kelly in 09/2015

## 2015-05-28 NOTE — Progress Notes (Signed)
Patient ID: [name], female   DOB: 12/21/1970, 44 y.o.   MRN: [id number]    Date:  05/28/2015   ID:  [name], DOB 1971/03/21, MRN [id number]  PCP:  Billee Cashing, MD  Primary Cardiologist:  Tresa Endo   Chief complaint: Preoperative clearance   History of Present Illness: [name] is a 44 y.o. female  44 y/o AA female with a history of IDDM and ESRD.  She is a bilateral amputee. She presented 02/01/15 with nausea and vomiting and ruled in for a NSTEMI. Cath revealed single vessel disease of the CFX treated with a DES., Echo showed an EF of 55%. She was actually admitted 48 hrs later with similar symptoms and an elevated Troponin but it was determined her Troponin was falling. Patient was discharged on aspirin and Brilinta.    The patient is here for preoperative clearance.  She requires exploration and decompression of median nerve in the right wrist.  She's been taking her Brilinta as prescribed since her stent was placed in June.  She has no complaints and currently denies nausea, vomiting, fever, chest pain, shortness of breath, orthopnea, dizziness, PND, cough, congestion, abdominal pain, hematochezia, melena.  Wt Readings from Last 3 Encounters:  05/17/15 153 lb (69.4 kg)  03/06/15 149 lb 14.6 oz (68 kg)  02/05/15 149 lb 14.6 oz (68 kg)     Past Medical History  Diagnosis Date  . GERD (gastroesophageal reflux disease)   . History of gangrene     left foot  . Hx MRSA infection   . S/P bilateral above knee amputation   . Coronary artery disease 01/2015    single vessel disease CFX, stented 02/01/2015  . Hypertension     states has been on med. x "a long time"  . Diabetes mellitus     IDDM  . ESRF (end stage renal failure)     dialysis M,W, F; left thigh AV graft  . High cholesterol   . 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 Impaired mobility     bilateral AKA, is in a wheelchair    Current  Outpatient Prescriptions  Medication Sig Dispense Refill  . amLODipine (NORVASC) 10 MG tablet Take 10 mg by mouth at bedtime.    Marland Kitchen aspirin 81 MG chewable tablet Chew 81 mg by mouth daily.    Marland Kitchen atorvastatin (LIPITOR) 40 MG tablet Take 1 tablet (40 mg total) by mouth daily at 6 PM. 30 tablet 0  . cinacalcet (SENSIPAR) 90 MG tablet Take 90 mg by mouth 4 (four) times daily.     . hydrALAZINE (APRESOLINE) 50 MG tablet Take 1 tablet (50 mg total) by mouth every 8 (eight) hours. 90 tablet 0  . insulin aspart protamine-insulin aspart (NOVOLOG 70/30) (70-30) 100 UNIT/ML injection Inject 6-10 Units into the skin 2 (two) times daily with a meal. Take 10 units in the morning and 6 units in the evening    . metoCLOPramide (REGLAN) 5 MG tablet Take 5 mg by mouth 3 (three) times daily before meals.    . metoprolol (LOPRESSOR) 50 MG tablet Take 1 tablet (50 mg total) by mouth 2 (two) times daily. 60 tablet 0  . sevelamer (RENVELA) 800 MG tablet Take 3,200 mg by mouth 3 (three) times daily with meals.     . ticagrelor (BRILINTA) 90 MG TABS tablet Take 1 tablet (90 mg  total) by mouth 2 (two) times daily. 60 tablet 0   No current facility-administered medications for this visit.    Allergies:    Allergies  Allergen Reactions  . Percocet [Oxycodone-Acetaminophen] Other (See Comments)    GI UPSET  . Soap Itching    IVORY SOAP  . Tramadol Itching    Social History:  The patient  reports that she has never smoked. She has never used smokeless tobacco. She reports that she does not drink alcohol or use illicit drugs.   Family history:   Family History  Problem Relation Age of Onset  . Diabetes Mother   . Heart disease Mother   . Hypertension Mother   . Heart attack Mother 66  . Diabetes Sister   . Hypertension Brother   . Heart attack Brother     ROS:  Please see the history of present illness.  All other systems reviewed and negative.   PHYSICAL EXAM: VS:  BP 110/76 mmHg  Pulse 68 obese, well  developed, in no acute distress HEENT: Pupils are equal round react to light accommodation extraocular movements are intact.  Neck: no JVDNo cervical lymphadenopathy. Cardiac: Regular rate and rhythm without murmurs rubs or gallops. Lungs:  clear to auscultation bilaterally, no wheezing, rhonchi or rales Abd: soft, nontender, positive bowel sounds all quadrants, no hepatosplenomegaly Ext: 2+ radial pulses. Skin: warm and dry Neuro:  Grossly normal  Ekg:  sinus rhythm lateral T-wave inversions which are not new. QTC 487.  ASSESSMENT AND PLAN:  Problem List Items Addressed This Visit    Essential hypertension (Chronic)   CAD S/P CFX DES 02/01/15 - Primary (Chronic)       essential hypertension:  Blood pressure well-controlled. No changes to current medications.   Coronary artery disease: Status post drug-eluting stent 02/01/2015 to the mid circumflex.  She's taking aspirin and Brilinta as prescribed.  She has not missed any doses.  He gets too soon to stop her Brilinta in order to have surgery. It does not appear to be emergent, however I do understand that she has infrequent pain.   I recommended waiting at least 3 more months minimum.  She would need to resume the Brilinta after surgery and continue for at least one year.  We will see her back with Dr. Tresa Endo after the first of the year

## 2015-08-13 ENCOUNTER — Emergency Department (HOSPITAL_COMMUNITY): Payer: Medicare Other

## 2015-08-13 ENCOUNTER — Encounter (HOSPITAL_COMMUNITY): Payer: Self-pay | Admitting: Emergency Medicine

## 2015-08-13 DIAGNOSIS — I2510 Atherosclerotic heart disease of native coronary artery without angina pectoris: Secondary | ICD-10-CM | POA: Insufficient documentation

## 2015-08-13 DIAGNOSIS — E119 Type 2 diabetes mellitus without complications: Secondary | ICD-10-CM | POA: Insufficient documentation

## 2015-08-13 DIAGNOSIS — R0600 Dyspnea, unspecified: Secondary | ICD-10-CM | POA: Diagnosis not present

## 2015-08-13 DIAGNOSIS — Z8614 Personal history of Methicillin resistant Staphylococcus aureus infection: Secondary | ICD-10-CM | POA: Insufficient documentation

## 2015-08-13 DIAGNOSIS — K219 Gastro-esophageal reflux disease without esophagitis: Secondary | ICD-10-CM | POA: Diagnosis not present

## 2015-08-13 DIAGNOSIS — I10 Essential (primary) hypertension: Secondary | ICD-10-CM | POA: Insufficient documentation

## 2015-08-13 DIAGNOSIS — Z7982 Long term (current) use of aspirin: Secondary | ICD-10-CM | POA: Insufficient documentation

## 2015-08-13 DIAGNOSIS — Z79899 Other long term (current) drug therapy: Secondary | ICD-10-CM | POA: Insufficient documentation

## 2015-08-13 DIAGNOSIS — R0602 Shortness of breath: Secondary | ICD-10-CM | POA: Diagnosis present

## 2015-08-13 DIAGNOSIS — Z794 Long term (current) use of insulin: Secondary | ICD-10-CM | POA: Diagnosis not present

## 2015-08-13 NOTE — ED Notes (Signed)
Pt. reports SOB with productive cough onset this evening , denies fever or chills.

## 2015-08-14 ENCOUNTER — Emergency Department (HOSPITAL_COMMUNITY)
Admission: EM | Admit: 2015-08-14 | Discharge: 2015-08-14 | Disposition: A | Discharge status: Home / Self Care | Payer: Medicare Other | Attending: Emergency Medicine | Admitting: Emergency Medicine

## 2015-08-14 DIAGNOSIS — R0600 Dyspnea, unspecified: Secondary | ICD-10-CM

## 2015-08-14 LAB — CBC
HCT: 27.8 % — ABNORMAL LOW (ref 36.0–46.0)
Hemoglobin: 9.4 g/dL — ABNORMAL LOW (ref 12.0–15.0)
MCH: 28.4 pg (ref 26.0–34.0)
MCHC: 33.8 g/dL (ref 30.0–36.0)
MCV: 84 fL (ref 78.0–100.0)
Platelets: 253 10*3/uL (ref 150–400)
RBC: 3.31 MIL/uL — ABNORMAL LOW (ref 3.87–5.11)
RDW: 17.3 % — ABNORMAL HIGH (ref 11.5–15.5)
WBC: 8.6 10*3/uL (ref 4.0–10.5)

## 2015-08-14 LAB — I-STAT TROPONIN, ED: Troponin i, poc: 0.01 ng/mL (ref 0.00–0.08)

## 2015-08-14 LAB — BASIC METABOLIC PANEL
Anion gap: 12 (ref 5–15)
BUN: 28 mg/dL — ABNORMAL HIGH (ref 6–20)
CO2: 30 mmol/L (ref 22–32)
Calcium: 9.9 mg/dL (ref 8.9–10.3)
Chloride: 94 mmol/L — ABNORMAL LOW (ref 101–111)
Creatinine, Ser: 6.72 mg/dL — ABNORMAL HIGH (ref 0.44–1.00)
GFR calc Af Amer: 8 mL/min — ABNORMAL LOW (ref >60)
GFR calc non Af Amer: 7 mL/min — ABNORMAL LOW (ref >60)
Glucose, Bld: 218 mg/dL — ABNORMAL HIGH (ref 65–99)
Potassium: 3.6 mmol/L (ref 3.5–5.1)
Sodium: 136 mmol/L (ref 135–145)

## 2015-08-14 NOTE — ED Notes (Signed)
Pt left with all her belongings and was wheeled out of the treatment area.  

## 2015-08-14 NOTE — Discharge Instructions (Signed)

## 2015-08-14 NOTE — ED Provider Notes (Signed)
CSN: [id number]     Arrival date & time 08/13/15  2311 History   First MD Initiated Contact with Patient 08/14/15 0249     Chief Complaint  Patient presents with  . Shortness of Breath     (Consider location/radiation/quality/duration/timing/severity/associated sxs/prior Treatment) HPI   44y with dyspnea. Worsening over past day. Constant. No appreciable exacerbating or relieving factors. +cough. Occasional whitish sputum. No fever or chills. No n/v. No cp. esrd with next dialysis this upcoming morning. Reports full session a couple days ago. Feels like she typically does when fluid overloaded.   Past Medical History  Diagnosis Date  . GERD (gastroesophageal reflux disease)   . History of gangrene     left foot  . Hx MRSA infection   . S/P bilateral above knee amputation (HCC)   . Coronary artery disease 01/2015    single vessel disease CFX, stented 02/01/2015  . Hypertension     states has been on med. x "a long time"  . Diabetes mellitus     IDDM  . ESRF (end stage renal failure) (HCC)     dialysis M,W, F; left thigh AV graft  . High cholesterol   . 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 Impaired mobility     bilateral AKA, is in a wheelchair   Past Surgical History  Procedure Laterality Date  . Av fistula placement  10/18/2008    right thigh AV graft  . Finger debridement  06/20/2010    right middle finger  . Finger amputation  05/27/2010    right middle  . Thrombectomy / arteriovenous graft revision  01/14/2010    thrombectomy left superficial femoral artery; left thigh AV graft placement  . 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 Leg amputation below knee  10/11/2009    right  . Central venous catheter insertion  08/06/2009    removal right IJ Diatek cath., placement left IJ Diatek cath.  . Above knee leg amputation  05/21/2009    left  . Foot amputation  04/04/2009    left transtibial  amputation  . Excision / curettage bone cyst talus / calcaneus  03/04/2009    partial calcaneal exc. left; placement wound VAC  . Groin debridement  11/16/2008    right rectus femoris muscle flap to right groin wound; right groing debridement  . Femoral endarterectomy  11/06/2008    right common femoral; embolectomy right femoral artery; removal right femoral AV graft  . Central venous catheter insertion  07/10/2008; 12/31/2006; 01/19/2006; 06/19/2005; 09/19/2004; 09/08/2004    right IJ Diatek catheter  . Av fistula repair  07/08/2008    removal left upper arm AV graft  . Thrombectomy / arteriovenous graft revision  05/11/2007;12/31/2006; 01/18/2006; 12/16/2005    left upper arm  . Dialysis fistula creation  01/20/2007    left upper arm AV graft  . Dialysis fistula creation  01/28/2006    right upper arm AV graft  . Thrombectomy / arteriovenous graft revision  10/26/2005    left forearm AV graft  . Dialysis fistula creation  10/04/2005    left forearm AV graft  . Av fistula repair  07/14/2005    removal infected right forearm AV graft  . Thrombectomy / arteriovenous graft revision  03/11/2005; 10/20/2004    right forearm AV graft  . Dialysis fistula creation  09/10/2004  right forearm AV graft  . Av fistula placement  07/18/2004    creation left AV fistula, radial to cephalic  . Finger exploration  07/13/2002    and repair left middle finger  . Carpal tunnel release  01/26/2012    Procedure: CARPAL TUNNEL RELEASE;  Surgeon: Nicki Reaper, MD;  Location: [facility];  Service: Orthopedics;  Laterality: Left;  . Esophagogastroduodenoscopy  06/26/2012    Procedure: ESOPHAGOGASTRODUODENOSCOPY (EGD);  Surgeon: Charna Elizabeth, MD;  Location[facility] ENDOSCOPY;  Service: Endoscopy;  Laterality: N/A;  . Shuntogram N/A 11/12/2011    Procedure: Betsey Amen;  Surgeon: Fransisco Hertz, MD;  Location: [facility] CATH LAB;  Service: Cardiovascular;  Laterality: N/A;  . Cardiac catheterization N/A 02/01/2015    Procedure: Left  Heart Cath and Coronary Angiography;  Surgeon: Lennette Bihari, MD;  Location: [facility] INVASIVE CV LAB;  Service: Cardiovascular;  Laterality: N/A;  . Cardiac catheterization N/A 02/01/2015    Procedure: Coronary Stent Intervention;  Surgeon: Lennette Bihari, MD;  Location: [facility] INVASIVE CV LAB;  Service: Cardiovascular;  Laterality: N/A;  . Thrombectomy and revision of arterioventous (av) goretex  graft Left 04/24/2012    thigh; replacement of venous limb   Family History  Problem Relation Age of Onset  . Diabetes Mother   . Heart disease Mother   . Hypertension Mother   . Heart attack Mother 37  . Diabetes Sister   . Hypertension Brother   . Heart attack Brother    Social History  Substance Use Topics  . Smoking status: Never Smoker   . Smokeless tobacco: Never Used  . Alcohol Use: No   OB History    No data available     Review of Systems  All systems reviewed and negative, other than as noted in HPI.   Allergies  Percocet; Soap; and Tramadol  Home Medications   Prior to Admission medications   Medication Sig Start Date End Date Taking? Authorizing Provider  amLODipine (NORVASC) 10 MG tablet Take 10 mg by mouth at bedtime.   Yes Historical Provider, MD  aspirin 81 MG chewable tablet Chew 81 mg by mouth daily.   Yes Historical Provider, MD  atorvastatin (LIPITOR) 40 MG tablet Take 1 tablet (40 mg total) by mouth daily at 6 PM. 02/02/15  Yes Calvert Cantor, MD  cinacalcet (SENSIPAR) 90 MG tablet Take 90 mg by mouth 3 (three) times daily with meals.    Yes Historical Provider, MD  insulin aspart protamine-insulin aspart (NOVOLOG 70/30) (70-30) 100 UNIT/ML injection Inject 6-10 Units into the skin 2 (two) times daily with a meal. Take 10 units in the morning and 6 units in the evening   Yes Historical Provider, MD  metoCLOPramide (REGLAN) 5 MG tablet Take 5 mg by mouth 3 (three) times daily before meals.   Yes Historical Provider, MD  metoprolol (LOPRESSOR) 50 MG tablet Take 1 tablet (50  mg total) by mouth 2 (two) times daily. 02/03/15  Yes Calvert Cantor, MD  sevelamer (RENVELA) 800 MG tablet Take 3,200 mg by mouth 3 (three) times daily with meals.    Yes Historical Provider, MD  ticagrelor (BRILINTA) 90 MG TABS tablet Take 1 tablet (90 mg total) by mouth 2 (two) times daily. 02/02/15  Yes Saima Rizwan, MD   BP 149/80 mmHg  Pulse 74  Temp(Src) 98.3 F (36.8 C) (Oral)  Resp 16  Wt 156 lb (70.761 kg)  SpO2 92% Physical Exam  Constitutional: She appears well-developed and well-nourished. No distress.  HENT:  Head: Normocephalic and atraumatic.  Eyes: Conjunctivae are normal. Right eye exhibits no discharge. Left eye exhibits no discharge.  Neck: Neck supple.  Cardiovascular: Normal rate, regular rhythm and normal heart sounds.  Exam reveals no gallop and no friction rub.   No murmur heard. Fistula L thigh with thrill.  Pulmonary/Chest: Effort normal and breath sounds normal. No respiratory distress.  Crackles b/l bases  Abdominal: Soft. She exhibits no distension. There is no tenderness.  Musculoskeletal: She exhibits no edema or tenderness.  B/l aka  Neurological: She is alert.  Skin: Skin is warm and dry.  Psychiatric: She has a normal mood and affect. Her behavior is normal. Thought content normal.  Nursing note and vitals reviewed.   ED Course  Procedures (including critical care time) Labs Review Labs Reviewed  BASIC METABOLIC PANEL - Abnormal; Notable for the following:    Chloride 94 (*)    Glucose, Bld 218 (*)    BUN 28 (*)    Creatinine, Ser 6.72 (*)    GFR calc non Af Amer 7 (*)    GFR calc Af Amer 8 (*)    All other components within normal limits  CBC - Abnormal; Notable for the following:    RBC 3.31 (*)    Hemoglobin 9.4 (*)    HCT 27.8 (*)    RDW 17.3 (*)    All other components within normal limits  I-STAT TROPOININ, ED    Imaging Review Dg Chest 2 View  08/13/2015  CLINICAL DATA:  Acute onset of shortness of breath and cough. Initial  encounter. EXAM: CHEST  2 VIEW COMPARISON:  Chest radiograph performed 02/04/2015 FINDINGS: The lungs are well-aerated and clear. Vascular congestion is noted. Mild bibasilar opacities likely reflect mild interstitial edema. There is no evidence of significant pleural effusion or pneumothorax. The heart is mildly enlarged. No acute osseous abnormalities are seen. IMPRESSION: Vascular congestion and mild cardiomegaly. Mild bibasilar airspace opacities likely reflect mild interstitial edema. Electronically Signed   By: Roanna Raider M.D.   On: 08/13/2015 23:45   I have personally reviewed and evaluated these images and lab results as part of my medical decision-making.   EKG Interpretation   Date/Time:  Tuesday August 13 2015 23:39:30 EST Ventricular Rate:  75 PR Interval:  150 QRS Duration: 104 QT Interval:  448 QTC Calculation: 500 R Axis:   60 Text Interpretation:  Normal sinus rhythm Nonspecific ST and T wave  abnormality Prolonged QT Abnormal ECG ED PHYSICIAN INTERPRETATION  AVAILABLE IN [facility] Confirmed by TEST, Record ([id number]) on  08/14/2015 6:48:09 AM      MDM   Final diagnoses:  Dyspnea    44yF with dyspnea. CXR with mild edema. Pt scheduled for dialysis in a few hours. HD stable. Not hyperkalemic. I fee she can be discharged for dialysis later today. Denies CP. Doubt ACS, PE or other emergent etiology.     Raeford Razor, MD 08/20/15 [phone]

## 2015-08-17 ENCOUNTER — Emergency Department (HOSPITAL_COMMUNITY)
Admission: EM | Admit: 2015-08-17 | Discharge: 2015-08-18 | Disposition: A | Discharge status: Home / Self Care | Payer: Medicare Other | Attending: Emergency Medicine | Admitting: Emergency Medicine

## 2015-08-17 ENCOUNTER — Encounter (HOSPITAL_COMMUNITY): Payer: Self-pay | Admitting: Emergency Medicine

## 2015-08-17 DIAGNOSIS — E7800 Pure hypercholesterolemia, unspecified: Secondary | ICD-10-CM | POA: Insufficient documentation

## 2015-08-17 DIAGNOSIS — Z8669 Personal history of other diseases of the nervous system and sense organs: Secondary | ICD-10-CM | POA: Diagnosis not present

## 2015-08-17 DIAGNOSIS — E119 Type 2 diabetes mellitus without complications: Secondary | ICD-10-CM | POA: Diagnosis not present

## 2015-08-17 DIAGNOSIS — Z794 Long term (current) use of insulin: Secondary | ICD-10-CM | POA: Diagnosis not present

## 2015-08-17 DIAGNOSIS — Z8719 Personal history of other diseases of the digestive system: Secondary | ICD-10-CM | POA: Insufficient documentation

## 2015-08-17 DIAGNOSIS — Z872 Personal history of diseases of the skin and subcutaneous tissue: Secondary | ICD-10-CM | POA: Insufficient documentation

## 2015-08-17 DIAGNOSIS — N186 End stage renal disease: Secondary | ICD-10-CM | POA: Insufficient documentation

## 2015-08-17 DIAGNOSIS — T82838A Hemorrhage of vascular prosthetic devices, implants and grafts, initial encounter: Secondary | ICD-10-CM | POA: Diagnosis not present

## 2015-08-17 DIAGNOSIS — Z7982 Long term (current) use of aspirin: Secondary | ICD-10-CM | POA: Insufficient documentation

## 2015-08-17 DIAGNOSIS — Z79899 Other long term (current) drug therapy: Secondary | ICD-10-CM | POA: Diagnosis not present

## 2015-08-17 DIAGNOSIS — Z8614 Personal history of Methicillin resistant Staphylococcus aureus infection: Secondary | ICD-10-CM | POA: Diagnosis not present

## 2015-08-17 DIAGNOSIS — I120 Hypertensive chronic kidney disease with stage 5 chronic kidney disease or end stage renal disease: Secondary | ICD-10-CM | POA: Diagnosis not present

## 2015-08-17 DIAGNOSIS — I2510 Atherosclerotic heart disease of native coronary artery without angina pectoris: Secondary | ICD-10-CM | POA: Diagnosis not present

## 2015-08-17 DIAGNOSIS — Y658 Other specified misadventures during surgical and medical care: Secondary | ICD-10-CM | POA: Insufficient documentation

## 2015-08-17 DIAGNOSIS — Z7902 Long term (current) use of antithrombotics/antiplatelets: Secondary | ICD-10-CM | POA: Insufficient documentation

## 2015-08-17 NOTE — ED Notes (Addendum)
Pt arrives via EMS from home for bleeding from dialysis graft on L leg. Bleeding controlled at this time. Pt reports she picked at a scab.

## 2015-08-17 NOTE — ED Provider Notes (Signed)
CSN: [id number]     Arrival date & time 08/17/15  2303 History   By signing my name below, I, Arlan Organ, attest that this documentation has been prepared under the direction and in the presence of Gilda Crease, MD.  Electronically Signed: Arlan Organ, ED Scribe. 08/17/2015. 11:18 PM.   Chief Complaint  Patient presents with  . Vascular Access Problem   The history is provided by the patient. No language interpreter was used.    HPI Comments: [name] brought in by EMS is a 44 y.o. female with a PMHx of CAD, HTN, DM, ESRF, and hyperlipidemia who presents to the Emergency Department here for a vascular access problem this evening. Pt states she last had her dialysis treatment yesterday and noted bleeding to the L leg dialysis draft after treatment. She admits to scratching and picking at the area and scab. Bleeding controlled a this time. No recent fever, chills, nausea, vomiting, chest pain, or shortness of breath.  PCP: Billee Cashing, MD    Past Medical History  Diagnosis Date  . GERD (gastroesophageal reflux disease)   . History of gangrene     left foot  . Hx MRSA infection   . S/P bilateral above knee amputation (HCC)   . Coronary artery disease 01/2015    single vessel disease CFX, stented 02/01/2015  . Hypertension     states has been on med. x "a long time"  . Diabetes mellitus     IDDM  . ESRF (end stage renal failure) (HCC)     dialysis M,W, F; left thigh AV graft  . High cholesterol   . 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 Impaired mobility     bilateral AKA, is in a wheelchair   Past Surgical History  Procedure Laterality Date  . Av fistula placement  10/18/2008    right thigh AV graft  . Finger debridement  06/20/2010    right middle finger  . Finger amputation  05/27/2010    right middle  . Thrombectomy / arteriovenous graft revision  01/14/2010    thrombectomy left  superficial femoral artery; left thigh AV graft placement  . 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 Leg amputation below knee  10/11/2009    right  . Central venous catheter insertion  08/06/2009    removal right IJ Diatek cath., placement left IJ Diatek cath.  . Above knee leg amputation  05/21/2009    left  . Foot amputation  04/04/2009    left transtibial amputation  . Excision / curettage bone cyst talus / calcaneus  03/04/2009    partial calcaneal exc. left; placement wound VAC  . Groin debridement  11/16/2008    right rectus femoris muscle flap to right groin wound; right groing debridement  . Femoral endarterectomy  11/06/2008    right common femoral; embolectomy right femoral artery; removal right femoral AV graft  . Central venous catheter insertion  07/10/2008; 12/31/2006; 01/19/2006; 06/19/2005; 09/19/2004; 09/08/2004    right IJ Diatek catheter  . Av fistula repair  07/08/2008    removal left upper arm AV graft  . Thrombectomy / arteriovenous graft revision  05/11/2007;12/31/2006; 01/18/2006; 12/16/2005    left upper arm  . Dialysis fistula creation  01/20/2007    left upper arm AV graft  . Dialysis fistula creation  01/28/2006  right upper arm AV graft  . Thrombectomy / arteriovenous graft revision  10/26/2005    left forearm AV graft  . Dialysis fistula creation  10/04/2005    left forearm AV graft  . Av fistula repair  07/14/2005    removal infected right forearm AV graft  . Thrombectomy / arteriovenous graft revision  03/11/2005; 10/20/2004    right forearm AV graft  . Dialysis fistula creation  09/10/2004    right forearm AV graft  . Av fistula placement  07/18/2004    creation left AV fistula, radial to cephalic  . Finger exploration  07/13/2002    and repair left middle finger  . Carpal tunnel release  01/26/2012    Procedure: CARPAL TUNNEL RELEASE;  Surgeon: Nicki ReaperGary R Kuzma, MD;  Location: [facility];  Service: Orthopedics;  Laterality: Left;  .  Esophagogastroduodenoscopy  06/26/2012    Procedure: ESOPHAGOGASTRODUODENOSCOPY (EGD);  Surgeon: Charna ElizabethJyothi Mann, MD;  [facility];  Service: Endoscopy;  Laterality: N/A;  . Shuntogram N/A 11/12/2011    Procedure: Betsey AmenSHUNTOGRAM;  Surgeon: Fransisco HertzBrian L Chen, MD;  [facility];  Service: Cardiovascular;  Laterality: N/A;  . Cardiac catheterization N/A 02/01/2015    Procedure: Left Heart Cath and Coronary Angiography;  Surgeon: Lennette Biharihomas A Kelly, MD;  Location: [facility] INVASIVE CV LAB;  Service: Cardiovascular;  Laterality: N/A;  . Cardiac catheterization N/A 02/01/2015    Procedure: Coronary Stent Intervention;  Surgeon: Lennette Biharihomas A Kelly, MD;  Location: [facility] INVASIVE CV LAB;  Service: Cardiovascular;  Laterality: N/A;  . Thrombectomy and revision of arterioventous (av) goretex  graft Left 04/24/2012    thigh; replacement of venous limb   Family History  Problem Relation Age of Onset  . Diabetes Mother   . Heart disease Mother   . Hypertension Mother   . Heart attack Mother 7464  . Diabetes Sister   . Hypertension Brother   . Heart attack Brother    Social History  Substance Use Topics  . Smoking status: Never Smoker   . Smokeless tobacco: Never Used  . Alcohol Use: No   OB History    No data available     Review of Systems  Constitutional: Negative for fever and chills.  Respiratory: Negative for cough and shortness of breath.   Cardiovascular: Negative for chest pain.  Gastrointestinal: Negative for nausea, vomiting and abdominal pain.  Musculoskeletal: Negative for back pain.  Psychiatric/Behavioral: Negative for confusion.  All other systems reviewed and are negative.     Allergies  Percocet; Soap; and Tramadol  Home Medications   Prior to Admission medications   Medication Sig Start Date End Date Taking? Authorizing Provider  amLODipine (NORVASC) 10 MG tablet Take 10 mg by mouth at bedtime.    Historical Provider, MD  aspirin 81 MG chewable tablet Chew 81 mg by mouth daily.     Historical Provider, MD  atorvastatin (LIPITOR) 40 MG tablet Take 1 tablet (40 mg total) by mouth daily at 6 PM. 02/02/15   Calvert CantorSaima Rizwan, MD  cinacalcet (SENSIPAR) 90 MG tablet Take 90 mg by mouth 3 (three) times daily with meals.     Historical Provider, MD  insulin aspart protamine-insulin aspart (NOVOLOG 70/30) (70-30) 100 UNIT/ML injection Inject 6-10 Units into the skin 2 (two) times daily with a meal. Take 10 units in the morning and 6 units in the evening    Historical Provider, MD  metoCLOPramide (REGLAN) 5 MG tablet Take 5 mg by mouth 3 (three) times daily  before meals.    Historical Provider, MD  metoprolol (LOPRESSOR) 50 MG tablet Take 1 tablet (50 mg total) by mouth 2 (two) times daily. 02/03/15   Calvert Cantor, MD  sevelamer (RENVELA) 800 MG tablet Take 3,200 mg by mouth 3 (three) times daily with meals.     Historical Provider, MD  ticagrelor (BRILINTA) 90 MG TABS tablet Take 1 tablet (90 mg total) by mouth 2 (two) times daily. 02/02/15   Calvert Cantor, MD   Triage Vitals: BP 83/51 mmHg  Pulse 78  Temp(Src) 98.2 F (36.8 C) (Oral)  Resp 18  SpO2 100%   Physical Exam  Constitutional: She is oriented to person, place, and time. She appears well-developed and well-nourished. No distress.  HENT:  Head: Normocephalic and atraumatic.  Right Ear: Hearing normal.  Left Ear: Hearing normal.  Nose: Nose normal.  Mouth/Throat: Oropharynx is clear and moist and mucous membranes are normal.  Eyes: Conjunctivae and EOM are normal. Pupils are equal, round, and reactive to light.  Neck: Normal range of motion. Neck supple.  Cardiovascular: Regular rhythm, S1 normal and S2 normal.  Exam reveals no gallop and no friction rub.   No murmur heard. Pulmonary/Chest: Effort normal and breath sounds normal. No respiratory distress. She exhibits no tenderness.  Abdominal: Soft. Normal appearance and bowel sounds are normal. There is no hepatosplenomegaly. There is no tenderness. There is no rebound, no  guarding, no tenderness at McBurney's point and negative Murphy's sign. No hernia.  Musculoskeletal: Normal range of motion.  Fistula site to L thigh with positive thrill. No redness or swelling noted.  Neurological: She is alert and oriented to person, place, and time. She has normal strength. No cranial nerve deficit or sensory deficit. Coordination normal. GCS eye subscore is 4. GCS verbal subscore is 5. GCS motor subscore is 6.  Skin: Skin is warm, dry and intact. No rash noted. No cyanosis.  Psychiatric: She has a normal mood and affect. Her speech is normal and behavior is normal. Thought content normal.  Nursing note and vitals reviewed.   ED Course  Procedures (including critical care time)  DIAGNOSTIC STUDIES: Oxygen Saturation is 100% on RA, Normal by my interpretation.    COORDINATION OF CARE: 11:13 PM- Will observe for continued bleeding of site. Discussed treatment plan with pt at bedside and pt agreed to plan.     Labs Review Labs Reviewed - No data to display  Imaging Review No results found. I have personally reviewed and evaluated these images and lab results as part of my medical decision-making.   EKG Interpretation None      MDM   Final diagnoses:  None  bleeding from dialysis graft site  Resents to the ER for evaluation of bleeding from dialysis graft. Patient reports that she had a scab over the dialysis graft site which she scratched and it came off, causing bleeding. Patient applied pressure to the site, bleeding has now stopped. Patient observed, no further bleeding.  Blood pressure was in the 90s when she got here. She reports that she has been intermittently running low and her doctors are taking her off of her blood pressure medications. I do not suspect that this is secondary to acute blood loss, as patient's hemoglobin is stable. Lab did inadvertently run a lactate which was noted to be elevated, significance is unclear at this time. She has benign  abdominal exam. She is afebrile, no concern for sepsis. Patient is alert, oriented without complaints currently. No further workup  necessary.  I personally performed the services described in this documentation, which was scribed in my presence. The recorded information has been reviewed and is accurate.    Gilda Crease, MD 08/18/15 [phone]

## 2015-08-18 DIAGNOSIS — T82838A Hemorrhage of vascular prosthetic devices, implants and grafts, initial encounter: Secondary | ICD-10-CM | POA: Diagnosis not present

## 2015-08-18 LAB — I-STAT CG4 LACTIC ACID, ED: Lactic Acid, Venous: 4.06 mmol/L (ref 0.5–2.0)

## 2015-08-18 LAB — I-STAT CHEM 8, ED
BUN: 27 mg/dL — AB (ref 6–20)
CHLORIDE: 91 mmol/L — AB (ref 101–111)
CREATININE: 5.7 mg/dL — AB (ref 0.44–1.00)
Calcium, Ion: 1.14 mmol/L (ref 1.12–1.23)
Glucose, Bld: 375 mg/dL — ABNORMAL HIGH (ref 65–99)
HEMATOCRIT: 29 % — AB (ref 36.0–46.0)
Hemoglobin: 9.9 g/dL — ABNORMAL LOW (ref 12.0–15.0)
POTASSIUM: 3.3 mmol/L — AB (ref 3.5–5.1)
Sodium: 135 mmol/L (ref 135–145)
TCO2: 28 mmol/L (ref 0–100)

## 2015-08-18 NOTE — ED Notes (Signed)
Patient left at this time with all belongings. 

## 2015-08-18 NOTE — Discharge Instructions (Signed)
Dialysis Vascular Access Malfunction °A vascular access is an entrance to your blood vessels that can be used for dialysis. A vascular access can be made in one of several ways:  °· Joining an artery to a vein under your skin to make a bigger blood vessel called a fistula.   °· Joining an artery to a vein under your skin using a soft tube called a graft.   °· Placing a thin, flexible tube (catheter) in a large vein, usually in your neck.   °A vascular access may malfunction or become blocked.  °WHAT CAN CAUSE YOUR VASCULAR ACCESS TO MALFUNCTION? °· Infection (common).   °· A blood clot inside a part of the fistula, graft, or catheter. A blood clot can completely or partially block the flow of blood.   °· A kink in the graft or catheter.   °· A collection of blood (called a hematoma or bruise) next to the graft or catheter that pushes against it, blocking the flow of blood.   °WHAT ARE SIGNS AND SYMPTOMS OF VASCULAR ACCESS MALFUNCTION? °· There is a change in the vibration or pulse of your fistula or graft. °· The vibration or pulse of your fistula or graft is gone.   °· There is new or unusual swelling of the area around the access.   °· There was an unsuccessful puncture of your access by the dialysis team.   °· The flow of blood through the fistula, graft, or catheter is too slow for effective dialysis.   °· When routine dialysis is completed and the needle is removed, bleeding lasts for too long a time.   °WHAT HAPPENS IF MY VASCULAR ACCESS MALFUNCTIONS? °Your health care provider may order blood work, cultures, or an X-ray test in order to learn what may be wrong with your vascular access. The X-ray test involves the injection of a liquid into the vascular access. The liquid shows up on the X-ray and allows your health care provider to see if there is a blockage in the vascular access.  °Treatment varies depending on the cause of the malfunction:   °· If the vascular access is infected, your health care provider  may prescribe antibiotic medicine to control the infection.   °· If a clot is found in the vascular access, you may need surgery to remove the clot.   °· If a blockage in the vascular access is due to some other cause (such as a kink in a graft), then you will likely need surgery to unblock or replace the graft.   °HOME CARE INSTRUCTIONS: °Follow up with your surgeon or other health care provider if you were instructed to do so. This is very important. Any delay in follow-up could cause permanent dysfunction of the vascular access, which may be dangerous.  °SEEK MEDICAL CARE IF:  °· Fever develops.   °· Swelling and pain around the vascular access gets worse or new pain develops. °· Pain, numbness, or an unusual pale skin color develops in the hand on the side of your vascular access. °SEEK IMMEDIATE MEDICAL CARE IF: °Unusual bleeding develops at the location of the vascular access. °MAKE SURE YOU: °· Understand these instructions. °· Will watch your condition. °· Will get help right away if you are not doing well or get worse. °  °This information is not intended to replace advice given to you by your health care provider. Make sure you discuss any questions you have with your health care provider. °  °Document Released: 07/20/2006 Document Revised: 09/07/2014 Document Reviewed: 01/19/2013 °Elsevier Interactive Patient Education ©2016 [employer] ° °

## 2015-08-18 NOTE — ED Notes (Signed)
Elevated lactic noted on patient, Pollina MD informed

## 2015-08-29 ENCOUNTER — Ambulatory Visit (HOSPITAL_COMMUNITY)
Admission: RE | Admit: 2015-08-29 | Discharge: 2015-08-29 | Disposition: A | Discharge status: Home / Self Care | Payer: Medicare Other | Source: Ambulatory Visit | Attending: Nephrology | Admitting: Nephrology

## 2015-08-29 DIAGNOSIS — D649 Anemia, unspecified: Secondary | ICD-10-CM | POA: Insufficient documentation

## 2015-08-29 LAB — PREPARE RBC (CROSSMATCH)

## 2015-08-29 LAB — ABO/RH: ABO/RH(D): A POS

## 2015-08-29 MED ORDER — SODIUM CHLORIDE 0.9 % IV SOLN: Freq: Once | INTRAVENOUS | Status: DC

## 2015-08-30 LAB — TYPE AND SCREEN
ABO/RH(D): A POS
ANTIBODY SCREEN: NEGATIVE
UNIT DIVISION: 0
UNIT DIVISION: 0

## 2015-09-08 ENCOUNTER — Emergency Department (HOSPITAL_COMMUNITY)
Admission: EM | Admit: 2015-09-08 | Discharge: 2015-09-08 | Disposition: A | Discharge status: Home / Self Care | Payer: Medicare Other | Attending: Emergency Medicine | Admitting: Emergency Medicine

## 2015-09-08 ENCOUNTER — Emergency Department (HOSPITAL_COMMUNITY): Payer: Medicare Other

## 2015-09-08 ENCOUNTER — Encounter (HOSPITAL_COMMUNITY): Payer: Self-pay

## 2015-09-08 DIAGNOSIS — Z9889 Other specified postprocedural states: Secondary | ICD-10-CM | POA: Diagnosis not present

## 2015-09-08 DIAGNOSIS — N186 End stage renal disease: Secondary | ICD-10-CM | POA: Insufficient documentation

## 2015-09-08 DIAGNOSIS — E119 Type 2 diabetes mellitus without complications: Secondary | ICD-10-CM | POA: Diagnosis not present

## 2015-09-08 DIAGNOSIS — Z79899 Other long term (current) drug therapy: Secondary | ICD-10-CM | POA: Diagnosis not present

## 2015-09-08 DIAGNOSIS — Z992 Dependence on renal dialysis: Secondary | ICD-10-CM | POA: Insufficient documentation

## 2015-09-08 DIAGNOSIS — Z794 Long term (current) use of insulin: Secondary | ICD-10-CM | POA: Diagnosis not present

## 2015-09-08 DIAGNOSIS — Z8614 Personal history of Methicillin resistant Staphylococcus aureus infection: Secondary | ICD-10-CM | POA: Diagnosis not present

## 2015-09-08 DIAGNOSIS — N281 Cyst of kidney, acquired: Secondary | ICD-10-CM | POA: Insufficient documentation

## 2015-09-08 DIAGNOSIS — E7800 Pure hypercholesterolemia, unspecified: Secondary | ICD-10-CM | POA: Insufficient documentation

## 2015-09-08 DIAGNOSIS — M546 Pain in thoracic spine: Secondary | ICD-10-CM | POA: Diagnosis not present

## 2015-09-08 DIAGNOSIS — Z7982 Long term (current) use of aspirin: Secondary | ICD-10-CM | POA: Insufficient documentation

## 2015-09-08 DIAGNOSIS — I120 Hypertensive chronic kidney disease with stage 5 chronic kidney disease or end stage renal disease: Secondary | ICD-10-CM | POA: Insufficient documentation

## 2015-09-08 DIAGNOSIS — K219 Gastro-esophageal reflux disease without esophagitis: Secondary | ICD-10-CM | POA: Insufficient documentation

## 2015-09-08 DIAGNOSIS — R197 Diarrhea, unspecified: Secondary | ICD-10-CM | POA: Insufficient documentation

## 2015-09-08 DIAGNOSIS — R109 Unspecified abdominal pain: Secondary | ICD-10-CM | POA: Diagnosis present

## 2015-09-08 DIAGNOSIS — R1084 Generalized abdominal pain: Secondary | ICD-10-CM | POA: Insufficient documentation

## 2015-09-08 DIAGNOSIS — I2510 Atherosclerotic heart disease of native coronary artery without angina pectoris: Secondary | ICD-10-CM | POA: Insufficient documentation

## 2015-09-08 LAB — COMPREHENSIVE METABOLIC PANEL
ALBUMIN: 3.2 g/dL — AB (ref 3.5–5.0)
ALK PHOS: 101 U/L (ref 38–126)
ALT: 9 U/L — ABNORMAL LOW (ref 14–54)
AST: 19 U/L (ref 15–41)
Anion gap: 14 (ref 5–15)
BILIRUBIN TOTAL: 0.7 mg/dL (ref 0.3–1.2)
BUN: 34 mg/dL — AB (ref 6–20)
CALCIUM: 8.8 mg/dL — AB (ref 8.9–10.3)
CO2: 29 mmol/L (ref 22–32)
Chloride: 95 mmol/L — ABNORMAL LOW (ref 101–111)
Creatinine, Ser: 7.67 mg/dL — ABNORMAL HIGH (ref 0.44–1.00)
GFR calc Af Amer: 7 mL/min — ABNORMAL LOW (ref >60)
GFR, EST NON AFRICAN AMERICAN: 6 mL/min — AB (ref >60)
GLUCOSE: 146 mg/dL — AB (ref 65–99)
POTASSIUM: 3.6 mmol/L (ref 3.5–5.1)
Sodium: 138 mmol/L (ref 135–145)
TOTAL PROTEIN: 7.3 g/dL (ref 6.5–8.1)

## 2015-09-08 LAB — CBC
HCT: 26.5 % — ABNORMAL LOW (ref 36.0–46.0)
Hemoglobin: 8.8 g/dL — ABNORMAL LOW (ref 12.0–15.0)
MCH: 28.2 pg (ref 26.0–34.0)
MCHC: 33.2 g/dL (ref 30.0–36.0)
MCV: 84.9 fL (ref 78.0–100.0)
PLATELETS: 221 10*3/uL (ref 150–400)
RBC: 3.12 MIL/uL — ABNORMAL LOW (ref 3.87–5.11)
RDW: 19.8 % — AB (ref 11.5–15.5)
WBC: 8.3 10*3/uL (ref 4.0–10.5)

## 2015-09-08 LAB — LIPASE, BLOOD: Lipase: 36 U/L (ref 11–51)

## 2015-09-08 MED ORDER — HYDROCODONE-ACETAMINOPHEN 5-325 MG PO TABS
1.0000 | ORAL_TABLET | Freq: Once | ORAL | Status: AC
  Administered 2015-09-08: 1 via ORAL
  Filled 2015-09-08: qty 1

## 2015-09-08 MED ORDER — HYDROCODONE-ACETAMINOPHEN 5-325 MG PO TABS: 2.0000 | ORAL_TABLET | ORAL | Status: DC | PRN

## 2015-09-08 MED ORDER — ONDANSETRON 4 MG PO TBDP
4.0000 mg | ORAL_TABLET | Freq: Once | ORAL | Status: AC
  Administered 2015-09-08: 4 mg via ORAL
  Filled 2015-09-08: qty 1

## 2015-09-08 NOTE — ED Notes (Signed)
MD at bedside. 

## 2015-09-08 NOTE — ED Notes (Signed)
Patient left at this time with all belongings. 

## 2015-09-08 NOTE — ED Notes (Signed)
Onset 2 nights ago bilateral lower back pain radiating around to abd, decreased appetite.  Last had dialysis on Friday.

## 2015-09-08 NOTE — Discharge Instructions (Signed)
Abdominal Pain, Adult °Many things can cause abdominal pain. Usually, abdominal pain is not caused by a disease and will improve without treatment. It can often be observed and treated at home. Your health care provider will do a physical exam and possibly order blood tests and X-rays to help determine the seriousness of your pain. However, in many cases, more time must pass before a clear cause of the pain can be found. Before that point, your health care provider may not know if you need more testing or further treatment. °HOME CARE INSTRUCTIONS °Monitor your abdominal pain for any changes. The following actions may help to alleviate any discomfort you are experiencing: °· Only take over-the-counter or prescription medicines as directed by your health care provider. °· Do not take laxatives unless directed to do so by your health care provider. °· Try a clear liquid diet (broth, tea, or water) as directed by your health care provider. Slowly move to a bland diet as tolerated. °SEEK MEDICAL CARE IF: °· You have unexplained abdominal pain. °· You have abdominal pain associated with nausea or diarrhea. °· You have pain when you urinate or have a bowel movement. °· You experience abdominal pain that wakes you in the night. °· You have abdominal pain that is worsened or improved by eating food. °· You have abdominal pain that is worsened with eating fatty foods. °· You have a fever. °SEEK IMMEDIATE MEDICAL CARE IF: °· Your pain does not go away within 2 hours. °· You keep throwing up (vomiting). °· Your pain is felt only in portions of the abdomen, such as the right side or the left lower portion of the abdomen. °· You pass bloody or black tarry stools. °MAKE SURE YOU: °· Understand these instructions. °· Will watch your condition. °· Will get help right away if you are not doing well or get worse. °  °This information is not intended to replace advice given to you by your health care provider. Make sure you discuss  any questions you have with your health care provider. °  °Document Released: 05/27/2005 Document Revised: 05/08/2015 Document Reviewed: 04/26/2013 °Elsevier Interactive Patient Education ©2016 Else[employer] Inc. ° °Back Pain, Adult °Back pain is very common in adults. The cause of back pain is rarely dangerous and the pain often gets better over time. The cause of your back pain may not be known. Some common causes of back pain include: °· Strain of the muscles or ligaments supporting the spine. °· Wear and tear (degeneration) of the spinal disks. °· Arthritis. °· Direct injury to the back. °For many people, back pain may return. Since back pain is rarely dangerous, most people can learn to manage this condition on their own. °HOME CARE INSTRUCTIONS °Watch your back pain for any changes. The following actions may help to lessen any discomfort you are feeling: °· Remain active. It is stressful on your back to sit or stand in one place for long periods of time. Do not sit, drive, or stand in one place for more than 30 minutes at a time. Take short walks on even surfaces as soon as you are able. Try to increase the length of time you walk each day. °· Exercise regularly as directed by your health care provider. Exercise helps your back heal faster. It also helps avoid future injury by keeping your muscles strong and flexible. °· Do not stay in bed. Resting more than 1-2 days can delay your recovery. °· Pay attention to your body when you bend and lift.   The most comfortable positions are those that put less stress on your recovering back. Always use proper lifting techniques, including:  Bending your knees.  Keeping the load close to your body.  Avoiding twisting.  Find a comfortable position to sleep. Use a firm mattress and lie on your side with your knees slightly bent. If you lie on your back, put a pillow under your knees.  Avoid feeling anxious or stressed.Stress increases muscle tension and can worsen back  pain.It is important to recognize when you are anxious or stressed and learn ways to manage it, such as with exercise.  Take medicines only as directed by your health care provider. Over-the-counter medicines to reduce pain and inflammation are often the most helpful.Your health care provider may prescribe muscle relaxant drugs.These medicines help dull your pain so you can more quickly return to your normal activities and healthy exercise.  Apply ice to the injured area:  Put ice in a plastic bag.  Place a towel between your skin and the bag.  Leave the ice on for 20 minutes, 2-3 times a day for the first 2-3 days. After that, ice and heat may be alternated to reduce pain and spasms.  Maintain a healthy weight. Excess weight puts extra stress on your back and makes it difficult to maintain good posture. SEEK MEDICAL CARE IF:  You have pain that is not relieved with rest or medicine.  You have increasing pain going down into the legs or buttocks.  You have pain that does not improve in one week.  You have night pain.  You lose weight.  You have a fever or chills. SEEK IMMEDIATE MEDICAL CARE IF:   You develop new bowel or bladder control problems.  You have unusual weakness or numbness in your arms or legs.  You develop nausea or vomiting.  You develop abdominal pain.  You feel faint.   This information is not intended to replace advice given to you by your health care provider. Make sure you discuss any questions you have with your health care provider.   Document Released: 08/17/2005 Document Revised: 09/07/2014 Document Reviewed: 12/19/2013 Elsevier Interactive Patient Education 2016 [employer].  Renal Mass A renal mass is a growth in the kidney. Some masses are harmful and may cause cancer. Others are harmless. A renal mass may be solid or filled with fluid. Those that are filled with fluid are called cysts. CAUSES Usually, the cause of a renal mass is unknown.  However, certain types of cancers and infections can cause a renal mass.  SIGNS AND SYMPTOMS Symptoms may include:  Blood in the urine.  Pain in the side or back (flank pain).  Feeling full soon after eating.  Weight loss.  Swelling in the abdomen. Some renal masses do not cause symptoms. DIAGNOSIS A renal mass may be found with a CT scan, ultrasound, or MRI of your abdomen.  TREATMENT Treatment will depend on the type of renal mass.  If the renal mass is a cyst that is not causing problems, you will not need treatment.  If the renal mass is a cyst that is causing problems, it may need to be drained during a type of surgery called laparoscopic surgery.  If the renal mass is solid, it may need to be removed with a surgery to your abdomen.  If the renal mass is caused by kidney cancer, you may need surgery to remove all or part of your kidney. You may need to see your health  care provider once or twice a year to have CT scans and ultrasounds done. Having these tests will allow your health care provider to see if your renal mass has changed or gotten bigger. HOME CARE INSTRUCTIONS What you need to do at home will depend on the type of renal mass that you have. The treatment you had also will make a difference. Follow the instructions your health care provider gives you. In general:  Keep all follow-up visits as directed by your health care provider.  Take medicines only as directed by your health care provider. SEEK MEDICAL CARE IF:  You have abdominal pain.  You have flank pain.  You have a fever. SEEK IMMEDIATE MEDICAL CARE IF:   Your pain gets worse.  There is blood in your urine.  You cannot urinate.  You have chest pain.  You have trouble breathing. MAKE SURE YOU:  Understand these instructions.  Will watch your condition.  Will get help right away if you are not doing well or get worse.   This information is not intended to replace advice given to you by  your health care provider. Make sure you discuss any questions you have with your health care provider.   Document Released: 03/14/2014 Document Reviewed: 03/14/2014 Elsevier Interactive Patient Education [employer]2016 Elsevier Inc.

## 2015-09-08 NOTE — ED Provider Notes (Signed)
CSN: [id number]     Arrival date & time 09/08/15  1711 History   First MD Initiated Contact with Patient 09/08/15 2031     Chief Complaint  Patient presents with  . Abdominal Pain     (Consider location/radiation/quality/duration/timing/severity/associated sxs/prior Treatment) Patient is a 45 y.o. female presenting with back pain. The history is provided by the patient.  Back Pain Location:  Thoracic spine Quality:  Aching Radiates to: around back to mid abdomen. Pain severity:  Moderate Onset quality:  Gradual Duration:  3 days Timing:  Intermittent Progression:  Waxing and waning Chronicity:  New Context: not falling, not jumping from heights, not lifting heavy objects, not MVA, not occupational injury, not pedestrian accident, not physical stress, not recent illness, not recent injury and not twisting   Relieved by:  Nothing Worsened by:  Palpation and movement Ineffective treatments:  Bowel activity, bed rest and OTC medications Associated symptoms: abdominal pain   Associated symptoms: no abdominal swelling, no bladder incontinence, no bowel incontinence, no chest pain, no dysuria, no fever, no headaches, no leg pain, no numbness, no paresthesias, no pelvic pain, no perianal numbness, no tingling, no weakness and no weight loss   Risk factors: obesity and vascular disease     Past Medical History  Diagnosis Date  . GERD (gastroesophageal reflux disease)   . History of gangrene     left foot  . Hx MRSA infection   . S/P bilateral above knee amputation (HCC)   . Coronary artery disease 01/2015    single vessel disease CFX, stented 02/01/2015  . Hypertension     states has been on med. x "a long time"  . Diabetes mellitus     IDDM  . ESRF (end stage renal failure) (HCC)     dialysis M,W, F; left thigh AV graft  . High cholesterol   . 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 Impaired mobility     bilateral  AKA, is in a wheelchair   Past Surgical History  Procedure Laterality Date  . Av fistula placement  10/18/2008    right thigh AV graft  . Finger debridement  06/20/2010    right middle finger  . Finger amputation  05/27/2010    right middle  . Thrombectomy / arteriovenous graft revision  01/14/2010    thrombectomy left superficial femoral artery; left thigh AV graft placement  . 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 Leg amputation below knee  10/11/2009    right  . Central venous catheter insertion  08/06/2009    removal right IJ Diatek cath., placement left IJ Diatek cath.  . Above knee leg amputation  05/21/2009    left  . Foot amputation  04/04/2009    left transtibial amputation  . Excision / curettage bone cyst talus / calcaneus  03/04/2009    partial calcaneal exc. left; placement wound VAC  . Groin debridement  11/16/2008    right rectus femoris muscle flap to right groin wound; right groing debridement  . Femoral endarterectomy  11/06/2008    right common femoral; embolectomy right femoral artery; removal right femoral AV graft  . Central venous catheter insertion  07/10/2008; 12/31/2006; 01/19/2006; 06/19/2005; 09/19/2004; 09/08/2004    right IJ Diatek catheter  . Av fistula repair  07/08/2008    removal left upper arm AV graft  . Thrombectomy /  arteriovenous graft revision  05/11/2007;12/31/2006; 01/18/2006; 12/16/2005    left upper arm  . Dialysis fistula creation  01/20/2007    left upper arm AV graft  . Dialysis fistula creation  01/28/2006    right upper arm AV graft  . Thrombectomy / arteriovenous graft revision  10/26/2005    left forearm AV graft  . Dialysis fistula creation  10/04/2005    left forearm AV graft  . Av fistula repair  07/14/2005    removal infected right forearm AV graft  . Thrombectomy / arteriovenous graft revision  03/11/2005; 10/20/2004    right forearm AV graft  . Dialysis fistula creation  09/10/2004    right forearm AV graft  . Av fistula placement   07/18/2004    creation left AV fistula, radial to cephalic  . Finger exploration  07/13/2002    and repair left middle finger  . Carpal tunnel release  01/26/2012    Procedure: CARPAL TUNNEL RELEASE;  Surgeon: Nicki Reaper, MD;  Location: [facility];  Service: Orthopedics;  Laterality: Left;  . Esophagogastroduodenoscopy  06/26/2012    Procedure: ESOPHAGOGASTRODUODENOSCOPY (EGD);  Surgeon: Charna Elizabeth, MD;  Location: [facility] ENDOSCOPY;  Service: Endoscopy;  Laterality: N/A;  . Shuntogram N/A 11/12/2011    Procedure: Betsey Amen;  Surgeon: Fransisco Hertz, MD;  Location: [facility] CATH LAB;  Service: Cardiovascular;  Laterality: N/A;  . Cardiac catheterization N/A 02/01/2015    Procedure: Left Heart Cath and Coronary Angiography;  Surgeon: Lennette Bihari, MD;  Location: [facility] INVASIVE CV LAB;  Service: Cardiovascular;  Laterality: N/A;  . Cardiac catheterization N/A 02/01/2015    Procedure: Coronary Stent Intervention;  Surgeon: Lennette Bihari, MD;  Location: [facility] INVASIVE CV LAB;  Service: Cardiovascular;  Laterality: N/A;  . Thrombectomy and revision of arterioventous (av) goretex  graft Left 04/24/2012    thigh; replacement of venous limb   Family History  Problem Relation Age of Onset  . Diabetes Mother   . Heart disease Mother   . Hypertension Mother   . Heart attack Mother 67  . Diabetes Sister   . Hypertension Brother   . Heart attack Brother    Social History  Substance Use Topics  . Smoking status: Never Smoker   . Smokeless tobacco: Never Used  . Alcohol Use: No   OB History    No data available     Review of Systems  Constitutional: Negative for fever and weight loss.  Eyes: Negative for pain.  Respiratory: Negative for chest tightness and shortness of breath.   Cardiovascular: Negative for chest pain.  Gastrointestinal: Positive for abdominal pain and diarrhea. Negative for nausea, vomiting, constipation and bowel incontinence.  Genitourinary: Negative for bladder  incontinence, dysuria and pelvic pain.  Musculoskeletal: Positive for back pain.  Skin: Negative for rash and wound.  Neurological: Negative for tingling, weakness, numbness, headaches and paresthesias.  Psychiatric/Behavioral: Negative for confusion.      Allergies  Percocet; Flagyl; Soap; and Tramadol  Home Medications   Prior to Admission medications   Medication Sig Start Date End Date Taking? Authorizing Provider  acetaminophen (TYLENOL) 500 MG tablet Take 1,000 mg by mouth every 6 (six) hours as needed for moderate pain.   Yes Historical Provider, MD  amLODipine (NORVASC) 10 MG tablet Take 10 mg by mouth at bedtime.   Yes Historical Provider, MD  aspirin 81 MG chewable tablet Chew 81 mg by mouth daily.   Yes Historical Provider, MD  atorvastatin (LIPITOR) 40 MG tablet  Take 1 tablet (40 mg total) by mouth daily at 6 PM. 02/02/15  Yes Calvert CantorSaima Rizwan, MD  cinacalcet (SENSIPAR) 90 MG tablet Take 90 mg by mouth 3 (three) times daily with meals.    Yes Historical Provider, MD  insulin aspart protamine-insulin aspart (NOVOLOG 70/30) (70-30) 100 UNIT/ML injection Inject 6-10 Units into the skin 2 (two) times daily with a meal. Take 10 units in the morning and 6 units in the evening   Yes Historical Provider, MD  metoCLOPramide (REGLAN) 5 MG tablet Take 5 mg by mouth 3 (three) times daily before meals.   Yes Historical Provider, MD  metoprolol (LOPRESSOR) 50 MG tablet Take 1 tablet (50 mg total) by mouth 2 (two) times daily. 02/03/15  Yes Calvert CantorSaima Rizwan, MD  sevelamer (RENVELA) 800 MG tablet Take 3,200 mg by mouth 3 (three) times daily with meals.    Yes Historical Provider, MD  ticagrelor (BRILINTA) 90 MG TABS tablet Take 1 tablet (90 mg total) by mouth 2 (two) times daily. 02/02/15  Yes Calvert CantorSaima Rizwan, MD  HYDROcodone-acetaminophen (NORCO/VICODIN) 5-325 MG tablet Take 2 tablets by mouth every 4 (four) hours as needed. 09/08/15   Stacy GardnerAndrew Shyenne Maggard, MD   BP 144/65 mmHg  Pulse 69  Temp(Src) 98.3 F (36.8  C) (Oral)  Resp 20  SpO2 90% Physical Exam  Constitutional: She appears well-developed and well-nourished. No distress.  HENT:  Head: Head is without abrasion, without contusion and without laceration.  Eyes: Conjunctivae and EOM are normal. Pupils are equal, round, and reactive to light.  Neck: Normal range of motion.  Cardiovascular: Normal rate, regular rhythm and normal pulses.   No murmur heard. Pulmonary/Chest: Effort normal and breath sounds normal.  Abdominal: Normal appearance. She exhibits no distension and no ascites. There is generalized tenderness. There is no rigidity, no rebound, no guarding, no CVA tenderness and no tenderness at McBurney's point.  Musculoskeletal:       Thoracic back: She exhibits tenderness and pain. She exhibits normal range of motion, no bony tenderness, no swelling, no edema, no deformity and no laceration.  Neurological: She is alert. She has normal strength. No cranial nerve deficit or sensory deficit. GCS eye subscore is 4. GCS verbal subscore is 5. GCS motor subscore is 6.  Skin: No rash noted.     Psychiatric: Her speech is normal.    ED Course  Procedures (including critical care time) Labs Review Labs Reviewed  COMPREHENSIVE METABOLIC PANEL - Abnormal; Notable for the following:    Chloride 95 (*)    Glucose, Bld 146 (*)    BUN 34 (*)    Creatinine, Ser 7.67 (*)    Calcium 8.8 (*)    Albumin 3.2 (*)    ALT 9 (*)    GFR calc non Af Amer 6 (*)    GFR calc Af Amer 7 (*)    All other components within normal limits  CBC - Abnormal; Notable for the following:    RBC 3.12 (*)    Hemoglobin 8.8 (*)    HCT 26.5 (*)    RDW 19.8 (*)    All other components within normal limits  LIPASE, BLOOD    Imaging Review Ct Abdomen Pelvis Wo Contrast  09/08/2015  CLINICAL DATA:  Bilateral back pain radiating to the abdomen. Symptoms for 2 days. EXAM: CT ABDOMEN AND PELVIS WITHOUT CONTRAST TECHNIQUE: Multidetector CT imaging of the abdomen and  pelvis was performed following the standard protocol without IV contrast. COMPARISON:  Contrast-enhanced CT 06/23/2012  FINDINGS: Lower chest: Scattered linear atelectasis or scarring. The heart is enlarged. Left lower lobe pulmonary nodules are new from prior exam, with a 4 mm nodule in the lingula, 5 mm nodule in the lower lobe, and 6 mm nodule abutting the diaphragm. Liver: Enlarged measuring 20.5 cm craniocaudal dimension. No focal lesion. Intrahepatic vascular calcifications are seen. Hepatobiliary: Gallbladder physiologically distended, no calcified stone. No biliary dilatation. Pancreas: No ductal dilatation or inflammation. Spleen: Normal in size. Intra splenic vascular calcifications are seen. Adrenal glands: No nodule. Kidneys: Thinning of the renal parenchyma consistent with chronic renal disease. Increased number and size of multiple cysts within both kidneys bilaterally, for example cyst in the lower right kidney measures 2.9 cm, previously 1.6 cm. A 2.6 cm cyst arising from the lower right kidney with hyperdense fluid fluid level, may reflect intra-cyst hemorrhage. Minimal adjacent soft tissue stranding. No hydronephrosis of either kidney. Stomach/Bowel: Stomach physiologically distended. There are no dilated or thickened small bowel loops. Small volume of stool throughout the colon without colonic wall thickening. The appendix is normal. Vascular/Lymphatic: Advanced atherosclerosis of the abdominal aorta and its branches. Probable vascular stents in the mesenteric vessels. No aneurysm. Small retroperitoneal lymph nodes. IVC appears prominent Reproductive: Calcified uterine fibroids.  No adnexal mass. Bladder: Decompressed. Diffuse wall thickening, likely secondary to decompressed state. Other: No free air, free fluid, or intra-abdominal fluid collection. Soft tissue density in the right groin, likely postsurgical scarring, unchanged. Left femoral dialysis catheter are unchanged, partially included.  Musculoskeletal: There are no acute or suspicious osseous abnormalities. Mild increased bone density diffusely suggestive of chronic change of chronic renal disease. IMPRESSION: 1. Cyst arising from the lower right kidney, with internal fluid level and trace adjacent soft tissue stranding. Findings are concerning for intracystic hemorrhage. This could be the cause of patient's pain. There is no frank hydronephrosis. 2. Renal atrophy consistent with chronic renal disease, additional cysts have also increased in size and number from prior. 3. Advanced atherosclerosis. 4. Left lower lobe pulmonary nodules are new from prior. Largest nodule measures 6 mm. If the patient is at high risk for bronchogenic carcinoma, follow-up chest CT at 6-12 months is recommended. If the patient is at low risk for bronchogenic carcinoma, follow-up chest CT at 12 months is recommended. This recommendation follows the consensus statement: Guidelines for Management of Small Pulmonary Nodules Detected on CT Scans: A Statement from the Fleischner Society as published in Radiology 2005;237:395-400. Electronically Signed   By: Rubye Oaks M.D.   On: 09/08/2015 22:21   Dg Chest 2 View  09/08/2015  CLINICAL DATA:  Bilateral back pain radiating to abdomen for 2 days EXAM: CHEST  2 VIEW COMPARISON:  08/13/2015 FINDINGS: Moderate cardiac enlargement. Vascular pattern normal. Mild bibasilar opacities. No consolidation or effusion. IMPRESSION: Improved but persistent bibasilar opacities which could represent a combination of atelectasis or scarring. No evidence of pulmonary edema. Cannot exclude possibility of infectious infiltrates in the appropriate clinical setting. Electronically Signed   By: Esperanza Heir M.D.   On: 09/08/2015 21:53   I have personally reviewed and evaluated these images and lab results as part of my medical decision-making.   EKG Interpretation None      MDM   Final diagnoses:  Bilateral thoracic back pain    Generalized abdominal pain  Renal cyst      45 year old black female with past medical history of end-stage renal disease presents with 2 days of mid to lower back pain which radiates around to abdomen. She reports  as being gradual in onset and continues to worsen. She reports she's tried Tylenol without significant relief of symptoms and due to continued pain present to the emergency department. Patient does not make any urination at this time. Patient denies any nausea, vomiting, chest pain, shortness of breath, fevers, vaginal bleeding, vaginal discharge, and constipation, diarrhea.  Examination patient has generalized tenderness to abdomen and mid back. In setting these findings lab for analysis obtained which did not reveal any significant electrolyte abnormalities, significant anemia or elevation white blood cell count. CT scan was obtained which revealed hemorrhagic renal cyst arising from the right kidney. This is likely cause of patient's pain. Patient was given oral pain medications with significant improvement in symptoms. We'll discharge home at this time with plan to follow up with pre-existing nephrologist for further management of symptoms. Patient given short course of pain medication. Patient stable at time of discharge and given strict return precautions.  CT scan additionally demonstrated pulmonary nodule. Patient advised to follow up PCP for further management and recheck a pulmonary nodule in 6-12 months.  Attending has seen and evaluated patient and Dr. Ranae Palms is in agreement with plan.  Stacy Gardner, MD 09/08/15 1610  Loren Racer, MD 09/10/15 [phone]

## 2015-09-10 ENCOUNTER — Ambulatory Visit (INDEPENDENT_AMBULATORY_CARE_PROVIDER_SITE_OTHER): Payer: Medicare Other | Admitting: Cardiovascular Disease

## 2015-09-10 ENCOUNTER — Encounter: Payer: Self-pay | Admitting: Cardiovascular Disease

## 2015-09-10 VITALS — BP 142/74 | HR 59

## 2015-09-10 DIAGNOSIS — IMO0001 Reserved for inherently not codable concepts without codable children: Secondary | ICD-10-CM

## 2015-09-10 DIAGNOSIS — I739 Peripheral vascular disease, unspecified: Secondary | ICD-10-CM

## 2015-09-10 DIAGNOSIS — Z992 Dependence on renal dialysis: Secondary | ICD-10-CM

## 2015-09-10 DIAGNOSIS — I2510 Atherosclerotic heart disease of native coronary artery without angina pectoris: Secondary | ICD-10-CM | POA: Diagnosis not present

## 2015-09-10 DIAGNOSIS — Z794 Long term (current) use of insulin: Secondary | ICD-10-CM

## 2015-09-10 DIAGNOSIS — I25810 Atherosclerosis of coronary artery bypass graft(s) without angina pectoris: Secondary | ICD-10-CM

## 2015-09-10 DIAGNOSIS — Z9861 Coronary angioplasty status: Secondary | ICD-10-CM

## 2015-09-10 DIAGNOSIS — Q6100 Congenital renal cyst, unspecified: Secondary | ICD-10-CM

## 2015-09-10 DIAGNOSIS — E118 Type 2 diabetes mellitus with unspecified complications: Secondary | ICD-10-CM

## 2015-09-10 DIAGNOSIS — N186 End stage renal disease: Secondary | ICD-10-CM | POA: Diagnosis not present

## 2015-09-10 DIAGNOSIS — E785 Hyperlipidemia, unspecified: Secondary | ICD-10-CM

## 2015-09-10 DIAGNOSIS — N281 Cyst of kidney, acquired: Secondary | ICD-10-CM

## 2015-09-10 LAB — LIPID PANEL
Cholesterol: 121 mg/dL — ABNORMAL LOW (ref 125–200)
HDL: 34 mg/dL — ABNORMAL LOW (ref >=46)
LDL CALC: 63 mg/dL (ref <130)
Total CHOL/HDL Ratio: 3.6 Ratio (ref <=5.0)
Triglycerides: 118 mg/dL (ref <150)
VLDL: 24 mg/dL (ref <30)

## 2015-09-10 NOTE — Patient Instructions (Signed)
Your physician recommends that you return for lab work in: TODAY.  Your physician wants you to follow-up in: JUNE You will receive a reminder letter in the mail two months in advance. If you don't receive a letter, please call our office to schedule the follow-up appointment.  If you need a refill on your cardiac medications before your next appointment, please call your pharmacy.

## 2015-09-11 ENCOUNTER — Encounter: Payer: Self-pay | Admitting: Cardiovascular Disease

## 2015-09-11 DIAGNOSIS — N281 Cyst of kidney, acquired: Secondary | ICD-10-CM | POA: Insufficient documentation

## 2015-09-11 NOTE — Progress Notes (Signed)
Patient ID: [name], female   DOB: 1971-07-26, 45 y.o.   MRN: [id number]     HPI: [name] is a 45 y.o. female who presents to the office today for a follow up cardiology evaluation following her hospitalization in June where she ruled in for non-ST segment elevation MI.  [name] is a 45 year old African-American female who has a long-standing history of diabetes mellitus, end-stage renal disease on dialysis, peripheral vascular disease, and is status post bilateral above-the-knee amputations.  On 02/01/2015.  She was admitted with nausea, vomiting, and increasing dyspnea.  Troponins were positive.  She underwent cardiac catheterization which revealed single-vessel CAD with 30-40% proximal left circumflex stenosis followed by a 99% proximal to mid stenosis prior to a sharp bend in the vessel.  She had elevation of left ventricular end-diastolic pressure 36.  1.  Successful PCI to the 99% circumflex stenosis with insertion of a 2.515 mm Xience Alpine DES stent postdilated to 2.71 mm.  Subsequent echo showed an ejection fraction at 55%.  She was seen in the office by Tenny Craw, PA-C for consideration of preoperative clearance prior to exploration and decompression for lesion nerve in her right wrist.  Since she was on dual antiplatelet therapy.  She was advised to defer having her surgery for a minimum of 6 months following her acute coronary syndrome, and ideally up to a year.  Recently, her wrist discomfort has improved and imminent surgery is not planned.  Review her medical records indicates that she was seen in the emergency room in January 2017 with back pain.  A CT scan revealed a cyst arising from the lower right kidney.  Internal fluid level and trace adjacent soft tissue stranding which was concern for possible intracystic hemorrhage.  Renal atrophy consistent with chronic renal disease and had numerous cysts.  There was advanced atherosclerosis.  There was left lower lobe pulmonary  nodules.  She presents to the office today for cardiology evaluation.  Past Medical History  Diagnosis Date  . GERD (gastroesophageal reflux disease)   . History of gangrene     left foot  . Hx MRSA infection   . S/P bilateral above knee amputation ([place])   . Coronary artery disease 01/2015    single vessel disease CFX, stented 02/01/2015  . Hypertension     states has been on med. x "a long time"  . Diabetes mellitus     IDDM  . ESRF (end stage renal failure) (HCC)     dialysis M,W, F; left thigh AV graft  . High cholesterol   . 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 Impaired mobility     bilateral AKA, is in a wheelchair    Past Surgical History  Procedure Laterality Date  . Av fistula placement  10/18/2008    right thigh AV graft  . Finger debridement  06/20/2010    right middle finger  . Finger amputation  05/27/2010    right middle  . Thrombectomy / arteriovenous graft revision  01/14/2010    thrombectomy left superficial femoral artery; left thigh AV graft placement  . 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 Leg amputation below knee  10/11/2009    right  . Central venous catheter insertion  08/06/2009    removal right IJ Diatek cath., placement left IJ Diatek cath.  . Above knee  leg amputation  05/21/2009    left  . Foot amputation  04/04/2009    left transtibial amputation  . Excision / curettage bone cyst talus / calcaneus  03/04/2009    partial calcaneal exc. left; placement wound VAC  . Groin debridement  11/16/2008    right rectus femoris muscle flap to right groin wound; right groing debridement  . Femoral endarterectomy  11/06/2008    right common femoral; embolectomy right femoral artery; removal right femoral AV graft  . Central venous catheter insertion  07/10/2008; 12/31/2006; 01/19/2006; 06/19/2005; 09/19/2004; 09/08/2004    right IJ Diatek catheter  . Av fistula repair  07/08/2008    removal  left upper arm AV graft  . Thrombectomy / arteriovenous graft revision  05/11/2007;12/31/2006; 01/18/2006; 12/16/2005    left upper arm  . Dialysis fistula creation  01/20/2007    left upper arm AV graft  . Dialysis fistula creation  01/28/2006    right upper arm AV graft  . Thrombectomy / arteriovenous graft revision  10/26/2005    left forearm AV graft  . Dialysis fistula creation  10/04/2005    left forearm AV graft  . Av fistula repair  07/14/2005    removal infected right forearm AV graft  . Thrombectomy / arteriovenous graft revision  03/11/2005; 10/20/2004    right forearm AV graft  . Dialysis fistula creation  09/10/2004    right forearm AV graft  . Av fistula placement  07/18/2004    creation left AV fistula, radial to cephalic  . Finger exploration  07/13/2002    and repair left middle finger  . Carpal tunnel release  01/26/2012    Procedure: CARPAL TUNNEL RELEASE;  Surgeon: Nicki Reaper, MD;  Location: [facility];  Service: Orthopedics;  Laterality: Left;  . Esophagogastroduodenoscopy  06/26/2012    Procedure: ESOPHAGOGASTRODUODENOSCOPY (EGD);  Surgeon: Charna Elizabeth, MD;  Location[facility] ENDOSCOPY;  Service: Endoscopy;  Laterality: N/A;  . Shuntogram N/A 11/12/2011    Procedure: Betsey Amen;  Surgeon: Fransisco Hertz, MD;  Location: [facility] CATH LAB;  Service: Cardiovascular;  Laterality: N/A;  . Cardiac catheterization N/A 02/01/2015    Procedure: Left Heart Cath and Coronary Angiography;  Surgeon: Lennette Bihari, MD;  Location: [facility] INVASIVE CV LAB;  Service: Cardiovascular;  Laterality: N/A;  . Cardiac catheterization N/A 02/01/2015    Procedure: Coronary Stent Intervention;  Surgeon: Lennette Bihari, MD;  Location: [facility] INVASIVE CV LAB;  Service: Cardiovascular;  Laterality: N/A;  . Thrombectomy and revision of arterioventous (av) goretex  graft Left 04/24/2012    thigh; replacement of venous limb    Allergies  Allergen Reactions  . Percocet [Oxycodone-Acetaminophen] Other (See Comments)     GI UPSET  . Flagyl [Metronidazole] Other (See Comments)  . Soap Itching    IVORY SOAP  . Tramadol Itching    Current Outpatient Prescriptions  Medication Sig Dispense Refill  . acetaminophen (TYLENOL) 500 MG tablet Take 1,000 mg by mouth every 6 (six) hours as needed for moderate pain.    Marland Kitchen amLODipine (NORVASC) 10 MG tablet Take 10 mg by mouth at bedtime.    Marland Kitchen aspirin 81 MG chewable tablet Chew 81 mg by mouth daily.    Marland Kitchen atorvastatin (LIPITOR) 40 MG tablet Take 1 tablet (40 mg total) by mouth daily at 6 PM. 30 tablet 0  . cinacalcet (SENSIPAR) 90 MG tablet Take 90 mg by mouth 3 (three) times daily with meals.     Marland Kitchen HYDROcodone-acetaminophen (NORCO/VICODIN) 5-325  MG tablet Take 2 tablets by mouth every 4 (four) hours as needed. 20 tablet 0  . insulin aspart protamine-insulin aspart (NOVOLOG 70/30) (70-30) 100 UNIT/ML injection Inject 6-10 Units into the skin 2 (two) times daily with a meal. Take 10 units in the morning and 6 units in the evening    . metoCLOPramide (REGLAN) 5 MG tablet Take 5 mg by mouth 3 (three) times daily before meals.    . metoprolol (LOPRESSOR) 50 MG tablet Take 1 tablet (50 mg total) by mouth 2 (two) times daily. 60 tablet 0  . sevelamer (RENVELA) 800 MG tablet Take 3,200 mg by mouth 3 (three) times daily with meals.     . ticagrelor (BRILINTA) 90 MG TABS tablet Take 1 tablet (90 mg total) by mouth 2 (two) times daily. 60 tablet 0   No current facility-administered medications for this visit.    Social History   Social History  . Marital Status: Single    Spouse Name: N/A  . Number of Children: N/A  . Years of Education: N/A   Occupational History  . Not on file.   Social History Main Topics  . Smoking status: Never Smoker   . Smokeless tobacco: Never Used  . Alcohol Use: No  . Drug Use: No  . Sexual Activity: Not on file   Other Topics Concern  . Not on file   Social History Narrative    Family History  Problem Relation Age of Onset  .  Diabetes Mother   . Heart disease Mother   . Hypertension Mother   . Heart attack Mother 21  . Diabetes Sister   . Hypertension Brother   . Heart attack Brother     ROS General: Negative; No fevers, chills, or night sweats HEENT: Negative; No changes in vision or hearing, sinus congestion, difficulty swallowing Pulmonary: Negative; No cough, wheezing, shortness of breath, hemoptysis Cardiovascular: See HPI:  GI: Negative; No nausea, vomiting, diarrhea, or abdominal pain GU: Negative; No dysuria, hematuria, or difficulty voiding Musculoskeletal: Bilateral above-the-knee amputations; recent back pain Renal: End-stage renal disease on hemodialysis Hematologic: Negative; no easy bruising, bleeding Endocrine: Negative; no heat/cold intolerance; no diabetes, Neuro: Negative; no changes in balance, headaches Skin: Negative; No rashes or skin lesions Psychiatric: Negative; No behavioral problems, depression Sleep: Negative; No snoring,  daytime sleepiness, hypersomnolence, bruxism, restless legs, hypnogognic hallucinations. Other comprehensive 14 point system review is negative   Physical Exam BP 142/74 mmHg  Pulse 59 Wt Readings from Last 3 Encounters:  08/13/15 156 lb (70.761 kg)  05/17/15 153 lb (69.4 kg)  03/06/15 149 lb 14.6 oz (68 kg)   General: Alert, oriented, no distress.  Skin: normal turgor, no rashes, warm and dry HEENT: Normocephalic, atraumatic. Pupils equal round and reactive to light; sclera anicteric; extraocular muscles intact, No lid lag; Nose without nasal septal hypertrophy; Mouth/Parynx benign; Mallinpatti scale 3 Neck: No JVD, no carotid bruits; normal carotid upstroke Lungs: clear to ausculatation and percussion bilaterally; no wheezing or rales, normal inspiratory and expiratory effort Chest wall: without tenderness to palpitation Heart: PMI not displaced, RRR, s1 s2 normal, 1/6 systolic murmur, No diastolic murmur, no rubs, gallops, thrills, or  heaves Abdomen: soft, nontender; no hepatosplenomehaly, BS+; abdominal aorta nontender and not dilated by palpation. Back: minimal tenderness in the right flank Pulses: 2+  Musculoskeletal: full range of motion, normal strength, no joint deformities Extremities: Bilateral above-the-knee amputations Neurologic: grossly nonfocal; Cranial nerves grossly wnl Psychologic: Normal mood and affect   ECG (independently read  by me): Normal sinus rhythm at 69 bpm.  T-wave inversion in leads 1 and aVL.  Increased QTc interval 514 ms.  LABS:  BMP Latest Ref Rng 09/08/2015 08/18/2015 08/14/2015  Glucose 65 - 99 mg/dL 146(H) 375(H) 218(H)  BUN 6 - 20 mg/dL 34(H) 27(H) 28(H)  Creatinine 0.44 - 1.00 mg/dL 7.67(H) 5.70(H) 6.72(H)  Sodium 135 - 145 mmol/L 138 135 136  Potassium 3.5 - 5.1 mmol/L 3.6 3.3(L) 3.6  Chloride 101 - 111 mmol/L 95(L) 91(L) 94(L)  CO2 22 - 32 mmol/L 29 - 30  Calcium 8.9 - 10.3 mg/dL 8.8(L) - 9.9     Hepatic Function Latest Ref Rng 09/08/2015 02/04/2015 02/01/2015  Total Protein 6.5 - 8.1 g/dL 7.3 8.1 8.2(H)  Albumin 3.5 - 5.0 g/dL 3.2(L) 3.5 3.5  AST 15 - 41 U/L '19 15 18  '$ ALT 14 - 54 U/L 9(L) 9(L) 9(L)  Alk Phosphatase 38 - 126 U/L 101 66 65  Total Bilirubin 0.3 - 1.2 mg/dL 0.7 1.2 0.5  Bilirubin, Direct 0.0 - 0.3 mg/dL - - -    CBC Latest Ref Rng 09/08/2015 08/18/2015 08/14/2015  WBC 4.0 - 10.5 K/uL 8.3 - 8.6  Hemoglobin 12.0 - 15.0 g/dL 8.8(L) 9.9(L) 9.4(L)  Hematocrit 36.0 - 46.0 % 26.5(L) 29.0(L) 27.8(L)  Platelets 150 - 400 K/uL 221 - 253   Lab Results  Component Value Date   MCV 84.9 09/08/2015   MCV 84.0 08/14/2015   MCV 83.5 02/04/2015    Lab Results  Component Value Date   TSH 2.358 02/01/2015    BNP No results found for: BNP  ProBNP No results found for: PROBNP   Lipid Panel     Component Value Date/Time   CHOL 121* 09/10/2015 0902   TRIG 118 09/10/2015 0902   HDL 34* 09/10/2015 0902   CHOLHDL 3.6 09/10/2015 0902   VLDL 24 09/10/2015 0902    LDLCALC 63 09/10/2015 0902     RADIOLOGY: Ct Abdomen Pelvis Wo Contrast  09/08/2015  CLINICAL DATA:  Bilateral back pain radiating to the abdomen. Symptoms for 2 days. EXAM: CT ABDOMEN AND PELVIS WITHOUT CONTRAST TECHNIQUE: Multidetector CT imaging of the abdomen and pelvis was performed following the standard protocol without IV contrast. COMPARISON:  Contrast-enhanced CT 06/23/2012 FINDINGS: Lower chest: Scattered linear atelectasis or scarring. The heart is enlarged. Left lower lobe pulmonary nodules are new from prior exam, with a 4 mm nodule in the lingula, 5 mm nodule in the lower lobe, and 6 mm nodule abutting the diaphragm. Liver: Enlarged measuring 20.5 cm craniocaudal dimension. No focal lesion. Intrahepatic vascular calcifications are seen. Hepatobiliary: Gallbladder physiologically distended, no calcified stone. No biliary dilatation. Pancreas: No ductal dilatation or inflammation. Spleen: Normal in size. Intra splenic vascular calcifications are seen. Adrenal glands: No nodule. Kidneys: Thinning of the renal parenchyma consistent with chronic renal disease. Increased number and size of multiple cysts within both kidneys bilaterally, for example cyst in the lower right kidney measures 2.9 cm, previously 1.6 cm. A 2.6 cm cyst arising from the lower right kidney with hyperdense fluid fluid level, may reflect intra-cyst hemorrhage. Minimal adjacent soft tissue stranding. No hydronephrosis of either kidney. Stomach/Bowel: Stomach physiologically distended. There are no dilated or thickened small bowel loops. Small volume of stool throughout the colon without colonic wall thickening. The appendix is normal. Vascular/Lymphatic: Advanced atherosclerosis of the abdominal aorta and its branches. Probable vascular stents in the mesenteric vessels. No aneurysm. Small retroperitoneal lymph nodes. IVC appears prominent Reproductive: Calcified uterine fibroids.  No adnexal mass. Bladder: Decompressed. Diffuse  wall thickening, likely secondary to decompressed state. Other: No free air, free fluid, or intra-abdominal fluid collection. Soft tissue density in the right groin, likely postsurgical scarring, unchanged. Left femoral dialysis catheter are unchanged, partially included. Musculoskeletal: There are no acute or suspicious osseous abnormalities. Mild increased bone density diffusely suggestive of chronic change of chronic renal disease. IMPRESSION: 1. Cyst arising from the lower right kidney, with internal fluid level and trace adjacent soft tissue stranding. Findings are concerning for intracystic hemorrhage. This could be the cause of patient's pain. There is no frank hydronephrosis. 2. Renal atrophy consistent with chronic renal disease, additional cysts have also increased in size and number from prior. 3. Advanced atherosclerosis. 4. Left lower lobe pulmonary nodules are new from prior. Largest nodule measures 6 mm. If the patient is at high risk for bronchogenic carcinoma, follow-up chest CT at 6-12 months is recommended. If the patient is at low risk for bronchogenic carcinoma, follow-up chest CT at 12 months is recommended. This recommendation follows the consensus statement: Guidelines for Management of Small Pulmonary Nodules Detected on CT Scans: A Statement from the [place] as published in Radiology 2005;237:395-400. Electronically Signed   By: Jeb Levering M.D.   On: 09/08/2015 22:21   Dg Chest 2 View  09/08/2015  CLINICAL DATA:  Bilateral back pain radiating to abdomen for 2 days EXAM: CHEST  2 VIEW COMPARISON:  08/13/2015 FINDINGS: Moderate cardiac enlargement. Vascular pattern normal. Mild bibasilar opacities. No consolidation or effusion. IMPRESSION: Improved but persistent bibasilar opacities which could represent a combination of atelectasis or scarring. No evidence of pulmonary edema. Cannot exclude possibility of infectious infiltrates in the appropriate clinical setting.  Electronically Signed   By: Skipper Cliche M.D.   On: 09/08/2015 21:53   Dg Chest 2 View  08/13/2015  CLINICAL DATA:  Acute onset of shortness of breath and cough. Initial encounter. EXAM: CHEST  2 VIEW COMPARISON:  Chest radiograph performed 02/04/2015 FINDINGS: The lungs are well-aerated and clear. Vascular congestion is noted. Mild bibasilar opacities likely reflect mild interstitial edema. There is no evidence of significant pleural effusion or pneumothorax. The heart is mildly enlarged. No acute osseous abnormalities are seen. IMPRESSION: Vascular congestion and mild cardiomegaly. Mild bibasilar airspace opacities likely reflect mild interstitial edema. Electronically Signed   By: Garald Balding M.D.   On: 08/13/2015 23:45      ASSESSMENT AND PLAN: Ms. Janelie Goltz is a 45 year old African-American female who has a long-standing history of insulin dependent diabetes mellitus and end-stage renal disease.  She suffered a non-ST segment elevation MI in June 2016 and was found to have subtotal stenosis of her left circumflex coronary artery which was successfully stented with a DES stent.  She had issues with her right wrist.  Presently her wrist discomfort has improved.  She is 6 months following her non-ST segment MI and continues to be on dual antiplatelet therapy with aspirin and Brilinta.  As long as she is stable from this perspective I have recommended that she defer elective surgery for another 6 months; however,  if surgery is necessary sooner she will need to stop her Brilinta for 5 days prior to procedure with plans to re-institute therapy once stable. Her resting pulse is 69 on her current dose of metoprolol 50 mg twice a day.  She continues to be on amlodipine 10 mg.  Her blood pressure today is stable.  She is on lipid lowering therapy with atorvastatin 40 mg  and denies myalgias.  She will be following up with Dr. Jimmy Footman for renal disease who will also review her CT findings and renal  cysts.  I am recommending fasting laboratory be obtained.  I will contact her regarding the results and adjustments to her medical regimen made if necessary.  I will see her in 6 months for cardiology reevaluation.    Time spent 25 minutes.   Troy Sine, MD, [facility]  09/11/2015 6:04 PM

## 2015-09-18 ENCOUNTER — Emergency Department (HOSPITAL_COMMUNITY)
Admission: EM | Admit: 2015-09-18 | Discharge: 2015-09-18 | Disposition: A | Discharge status: Home / Self Care | Payer: Medicare Other | Attending: Emergency Medicine | Admitting: Emergency Medicine

## 2015-09-18 ENCOUNTER — Encounter (HOSPITAL_COMMUNITY): Payer: Self-pay | Admitting: Emergency Medicine

## 2015-09-18 ENCOUNTER — Encounter: Payer: Self-pay | Admitting: *Deleted

## 2015-09-18 DIAGNOSIS — Z7902 Long term (current) use of antithrombotics/antiplatelets: Secondary | ICD-10-CM | POA: Diagnosis not present

## 2015-09-18 DIAGNOSIS — Z79899 Other long term (current) drug therapy: Secondary | ICD-10-CM | POA: Insufficient documentation

## 2015-09-18 DIAGNOSIS — Z8614 Personal history of Methicillin resistant Staphylococcus aureus infection: Secondary | ICD-10-CM | POA: Diagnosis not present

## 2015-09-18 DIAGNOSIS — Z7982 Long term (current) use of aspirin: Secondary | ICD-10-CM | POA: Insufficient documentation

## 2015-09-18 DIAGNOSIS — N186 End stage renal disease: Secondary | ICD-10-CM | POA: Insufficient documentation

## 2015-09-18 DIAGNOSIS — T82838A Hemorrhage of vascular prosthetic devices, implants and grafts, initial encounter: Secondary | ICD-10-CM | POA: Diagnosis not present

## 2015-09-18 DIAGNOSIS — Z794 Long term (current) use of insulin: Secondary | ICD-10-CM | POA: Diagnosis not present

## 2015-09-18 DIAGNOSIS — I120 Hypertensive chronic kidney disease with stage 5 chronic kidney disease or end stage renal disease: Secondary | ICD-10-CM | POA: Diagnosis not present

## 2015-09-18 DIAGNOSIS — Z9889 Other specified postprocedural states: Secondary | ICD-10-CM | POA: Diagnosis not present

## 2015-09-18 DIAGNOSIS — T8249XA Other complication of vascular dialysis catheter, initial encounter: Secondary | ICD-10-CM | POA: Diagnosis present

## 2015-09-18 DIAGNOSIS — Z872 Personal history of diseases of the skin and subcutaneous tissue: Secondary | ICD-10-CM | POA: Diagnosis not present

## 2015-09-18 DIAGNOSIS — Z8719 Personal history of other diseases of the digestive system: Secondary | ICD-10-CM | POA: Diagnosis not present

## 2015-09-18 DIAGNOSIS — E7800 Pure hypercholesterolemia, unspecified: Secondary | ICD-10-CM | POA: Diagnosis not present

## 2015-09-18 DIAGNOSIS — Z8669 Personal history of other diseases of the nervous system and sense organs: Secondary | ICD-10-CM | POA: Insufficient documentation

## 2015-09-18 DIAGNOSIS — E119 Type 2 diabetes mellitus without complications: Secondary | ICD-10-CM | POA: Insufficient documentation

## 2015-09-18 DIAGNOSIS — Z992 Dependence on renal dialysis: Secondary | ICD-10-CM | POA: Insufficient documentation

## 2015-09-18 DIAGNOSIS — Y658 Other specified misadventures during surgical and medical care: Secondary | ICD-10-CM | POA: Diagnosis not present

## 2015-09-18 LAB — BASIC METABOLIC PANEL
Anion gap: 15 (ref 5–15)
BUN: 15 mg/dL (ref 6–20)
CO2: 28 mmol/L (ref 22–32)
Calcium: 8.9 mg/dL (ref 8.9–10.3)
Chloride: 96 mmol/L — ABNORMAL LOW (ref 101–111)
Creatinine, Ser: 7.1 mg/dL — ABNORMAL HIGH (ref 0.44–1.00)
GFR calc Af Amer: 7 mL/min — ABNORMAL LOW (ref >60)
GFR calc non Af Amer: 6 mL/min — ABNORMAL LOW (ref >60)
Glucose, Bld: 79 mg/dL (ref 65–99)
Potassium: 3.5 mmol/L (ref 3.5–5.1)
Sodium: 139 mmol/L (ref 135–145)

## 2015-09-18 LAB — CBC
HCT: 29.2 % — ABNORMAL LOW (ref 36.0–46.0)
Hemoglobin: 9.3 g/dL — ABNORMAL LOW (ref 12.0–15.0)
MCH: 27.5 pg (ref 26.0–34.0)
MCHC: 31.8 g/dL (ref 30.0–36.0)
MCV: 86.4 fL (ref 78.0–100.0)
Platelets: 460 10*3/uL — ABNORMAL HIGH (ref 150–400)
RBC: 3.38 MIL/uL — ABNORMAL LOW (ref 3.87–5.11)
RDW: 18.7 % — ABNORMAL HIGH (ref 11.5–15.5)
WBC: 8.9 10*3/uL (ref 4.0–10.5)

## 2015-09-18 LAB — CBG MONITORING, ED
GLUCOSE-CAPILLARY: 71 mg/dL (ref 65–99)
GLUCOSE-CAPILLARY: 82 mg/dL (ref 65–99)
Glucose-Capillary: 76 mg/dL (ref 65–99)

## 2015-09-18 MED ORDER — ADENOSINE 6 MG/2ML IV SOLN: 6.0000 mg | Freq: Once | INTRAVENOUS | Status: DC

## 2015-09-18 MED ORDER — GLUCOSE 40 % PO GEL
1.0000 | Freq: Once | ORAL | Status: AC
  Administered 2015-09-18: 37.5 g via ORAL
  Filled 2015-09-18: qty 1

## 2015-09-18 NOTE — ED Notes (Signed)
CBG 82 

## 2015-09-18 NOTE — ED Notes (Signed)
Pt. reports bleeding at left  upper thigh AV graft this morning denies injury, pressure applied during transport - no bleeding at arrival , hemodialysis q Mon/Wed/Friday.

## 2015-09-18 NOTE — ED Notes (Signed)
Pt CBG, 82. Nurse was notified. 

## 2015-09-18 NOTE — ED Notes (Signed)
Pt CBG, 76. Nurse was notified. 

## 2015-09-18 NOTE — Consult Note (Addendum)
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  She states that for the last month she has had prolonged bleeding from her left thigh graft site.  It has taken over an hour for the stick sites to stop bleeding after HD.  She was referred to Dr. 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  She has not had HD since Mon. And when she woke up this morning her thigh was bleeding.  She was transported via EMS to [facility] hospital.  Since she was here in the ED there has not been any bleeding around her graft site.    The last revision that was performed on her left AV thigh graft was Replacement of venous limb of the AV Gore-Tex graft left femoral loop.     No current facility-administered medications for this encounter.   Current Outpatient Prescriptions  Medication Sig Dispense Refill  . acetaminophen (TYLENOL) 500 MG tablet Take 1,000 mg by mouth every 6 (six) hours as needed for moderate pain.    Marland Kitchen amLODipine (NORVASC) 10 MG tablet Take 10 mg by mouth at bedtime.    Marland Kitchen aspirin 81 MG chewable tablet Chew 81 mg by mouth daily.    Marland Kitchen atorvastatin (LIPITOR) 40 MG tablet Take 1 tablet (40 mg total) by mouth daily at 6 PM. 30 tablet 0  . cinacalcet (SENSIPAR) 90 MG tablet Take 90 mg by mouth 3 (three) times daily with meals.     Marland Kitchen HYDROcodone-acetaminophen (NORCO/VICODIN) 5-325 MG tablet Take 2 tablets by mouth every 4 (four) hours as needed. 20 tablet 0  . insulin aspart protamine-insulin aspart (NOVOLOG 70/30) (70-30) 100 UNIT/ML injection Inject 6-10 Units into the skin 2 (two) times daily with a meal. Take 10 units in the morning and 6 units in the evening    . metoCLOPramide (REGLAN) 5 MG tablet Take 5 mg by mouth 3 (three) times daily before meals.    . metoprolol (LOPRESSOR) 50 MG tablet Take 1 tablet (50 mg total) by mouth 2 (two)  times daily. 60 tablet 0  . sevelamer (RENVELA) 800 MG tablet Take 3,200 mg by mouth 3 (three) times daily with meals.     . ticagrelor (BRILINTA) 90 MG TABS tablet Take 1 tablet (90 mg total) by mouth 2 (two) times daily. 60 tablet 0      Past Medical History  Diagnosis Date  . GERD (gastroesophageal reflux disease)   . History of gangrene     left foot  . Hx MRSA infection   . S/P bilateral above knee amputation (HCC)   . Coronary artery disease 01/2015    single vessel disease CFX, stented 02/01/2015  . Hypertension     states has been on med. x "a long time"  . Diabetes mellitus     IDDM  . ESRF (end stage renal failure) (HCC)     dialysis M,W, F; left thigh AV graft  . High cholesterol   . 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 Impaired mobility     bilateral AKA, is in a wheelchair    Past Surgical History  Procedure Laterality Date  . Av fistula placement  10/18/2008    right thigh  AV graft  . Finger debridement  06/20/2010    right middle finger  . Finger amputation  05/27/2010    right middle  . Thrombectomy / arteriovenous graft revision  01/14/2010    thrombectomy left superficial femoral artery; left thigh AV graft placement  . 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 Leg amputation below knee  10/11/2009    right  . Central venous catheter insertion  08/06/2009    removal right IJ Diatek cath., placement left IJ Diatek cath.  . Above knee leg amputation  05/21/2009    left  . Foot amputation  04/04/2009    left transtibial amputation  . Excision / curettage bone cyst talus / calcaneus  03/04/2009    partial calcaneal exc. left; placement wound VAC  . Groin debridement  11/16/2008    right rectus femoris muscle flap to right groin wound; right groing debridement  . Femoral endarterectomy  11/06/2008    right common femoral; embolectomy right femoral artery; removal right femoral AV graft  .  Central venous catheter insertion  07/10/2008; 12/31/2006; 01/19/2006; 06/19/2005; 09/19/2004; 09/08/2004    right IJ Diatek catheter  . Av fistula repair  07/08/2008    removal left upper arm AV graft  . Thrombectomy / arteriovenous graft revision  05/11/2007;12/31/2006; 01/18/2006; 12/16/2005    left upper arm  . Dialysis fistula creation  01/20/2007    left upper arm AV graft  . Dialysis fistula creation  01/28/2006    right upper arm AV graft  . Thrombectomy / arteriovenous graft revision  10/26/2005    left forearm AV graft  . Dialysis fistula creation  10/04/2005    left forearm AV graft  . Av fistula repair  07/14/2005    removal infected right forearm AV graft  . Thrombectomy / arteriovenous graft revision  03/11/2005; 10/20/2004    right forearm AV graft  . Dialysis fistula creation  09/10/2004    right forearm AV graft  . Av fistula placement  07/18/2004    creation left AV fistula, radial to cephalic  . Finger exploration  07/13/2002    and repair left middle finger  . Carpal tunnel release  01/26/2012    Procedure: CARPAL TUNNEL RELEASE;  Surgeon: Mary Reaper, MD;  Location: [facility];  Service: Orthopedics;  Laterality: Left;  . Esophagogastroduodenoscopy  06/26/2012    Procedure: ESOPHAGOGASTRODUODENOSCOPY (EGD);  Surgeon: Mary Elizabeth, MD;  Location: [facility] ENDOSCOPY;  Service: Endoscopy;  Laterality: N/A;  . Shuntogram N/A 11/12/2011    Procedure: [name];  Surgeon: Mary Hertz, MD;  Location: [facility] CATH LAB;  Service: Cardiovascular;  Laterality: N/A;  . Cardiac catheterization N/A 02/01/2015    Procedure: Left Heart Cath and Coronary Angiography;  Surgeon: Mary Bihari, MD;  Location: [facility] INVASIVE CV LAB;  Service: Cardiovascular;  Laterality: N/A;  . Cardiac catheterization N/A 02/01/2015    Procedure: Coronary Stent Intervention;  Surgeon: Mary Bihari, MD;  Location: [facility] INVASIVE CV LAB;  Service: Cardiovascular;  Laterality: N/A;  . Thrombectomy and revision of  arterioventous (av) goretex  graft Left 04/24/2012    thigh; replacement of venous limb    Social History Social History  Substance Use Topics  . Smoking status: Never Smoker   . Smokeless tobacco: Never Used  . Alcohol Use: No    Family History Family History  Problem Relation Age of Onset  . Diabetes Mother   . Heart disease Mother   . Hypertension  Mother   . Heart attack Mother 17  . Diabetes Sister   . Hypertension Brother   . Heart attack Brother     Allergies  Allergen Reactions  . Percocet [Oxycodone-Acetaminophen] Other (See Comments)    GI UPSET  . Flagyl [Metronidazole] Other (See Comments)  . Soap Itching    IVORY SOAP  . Tramadol Itching     REVIEW OF SYSTEMS  General:  Weight loss,  Fever,  chills Neurologic:  Dizziness,  Blackouts,  Seizure  Stroke,  "Mini stroke",  Slurred speech,  Temporary blindness;  weakness in arms or legs,  Hoarseness  Dysphagia Cardiac:  Chest pain/pressure,  Shortness of breath at rest  Shortness of breath with exertion,  Atrial fibrillation or irregular heartbeat  Vascular:  Pain in legs with walking,  Pain in legs at rest,  Pain in legs at night,   Non-healing ulcer,  Blood clot in vein/DVT,   Pulmonary:  Home oxygen,  Productive cough,  Coughing up blood,  Asthma,   Wheezing  COPD Musculoskeletal:   Arthritis,  Low back pain,  Joint pain Hematologic:  Easy Bruising,  Anemia;  Hepatitis Gastrointestinal:  Blood in stool,  Gastroesophageal Reflux/heartburn, Urinary: [ x] chronic Kidney disease,  on HD - [ x] MWF or  TTHS,  Burning with urination,  Difficulty urinating Skin:  Rashes,  Wounds Psychological:  Anxiety,  Depression  Physical Examination Filed Vitals:   09/18/15 0609 09/18/15 0900  BP: 142/67 123/68  Pulse: 91 65  Temp: 98.6 F (37 C)   TempSrc: Oral   Resp: 14 17  SpO2: 99%  96%   There is no weight on file to calculate BMI.  General:  WDWN in NAD Gait: Normal HENT: WNL Eyes: Pupils equal Pulmonary: normal non-labored breathing , without Rales, rhonchi,  wheezing Cardiac: RRR, without  Murmurs, rubs or gallops; No carotid bruits Abdomen: soft, NT, no masses Skin: no rashes, ulcers noted;  no Gangrene , no cellulitis; no open wounds;  Vascular Exam/Pulses:palpable thrill left thigh AVG, no bleeding surrounding the graft.  No pulsatile mass. Musculoskeletal: Bilateral AKA well healed Neurologic: A&O X 3; Appropriate Affect ; SENSATION: normal; MOTOR FUNCTION: 5/5 Symmetric UE; Speech is fluent/normal   Significant Diagnostic Studies: CBC Lab Results  Component Value Date   WBC 8.9 09/18/2015   HGB 9.3* 09/18/2015   HCT 29.2* 09/18/2015   MCV 86.4 09/18/2015   PLT 460* 09/18/2015    BMET    Component Value Date/Time   NA 139 09/18/2015 0951   K 3.5 09/18/2015 0951   CL 96* 09/18/2015 0951   CO2 28 09/18/2015 0951   GLUCOSE 79 09/18/2015 0951   BUN 15 09/18/2015 0951   CREATININE 7.10* 09/18/2015 0951   CALCIUM 8.9 09/18/2015 0951   GFRNONAA 6* 09/18/2015 0951   GFRAA 7* 09/18/2015 0951   CrCl cannot be calculated (Unknown ideal weight.).  COAG Lab Results  Component Value Date   INR 1.11 02/01/2015   INR 1.04 06/18/2012   INR 1.19 04/24/2012     Non-Invasive Vascular Imaging:   ASSESSMENT/PLAN:  History of bleeding graft left thigh av graft We need to see the studies from Dr. Truman Hayward office.  Since the graft is not bleeding, this could  be worked up as an outpatient.   I took a CD to Dr. Imogene Burn of Dr. Truman Hayward procedure for review.  Pending this she maybe discharged home today.   Clinton Gallant [facility] 09/18/2015 10:51 AM   Addendum  I have independently interviewed and examined the patient, and I agree with the physician assistant's findings.  There is an area of attenuated skin overlying the arterial limb of this AVG compatible  with a pseudoaneurysm.  Unfortunately, the Interventional Nephrology study cannot be viewed.  Patient will need revision of this left thigh AVG based on clinical exam.  Unfortunately, this patient is on Brilinta for her cardiac stent, so she is NOT a surgical candidate as Brilinta can NOT be easily reversed, even with platelet transfusion.  The [facility] main pharmacy is recommending a 7 days period of reversal prior to proceeding with thigh AVG revision.  Will have to get in touch with Cardiology to determine the replacement regimen they want.    - In event of severe bleeding, will have to oversew the thigh graft externally and revise the graft later on once the Brilinta is reversed. - I gave the patient explicit instructions in regards to single finger occlusion of any bleeding and call EMS. - My office will contact Cardiology to determine bridging regimen which coming off Brilinta.  Will then also scheduled L thigh AVG revision with interposition graft for 7 days after Brilinta held.  Leonides Sake, MD [facility] Office: [phone] Pager: [phone]  09/18/2015, 3:00 PM

## 2015-09-18 NOTE — ED Provider Notes (Signed)
CSN: [id number]     Arrival date & time 09/18/15  0604 History   First MD Initiated Contact with Patient 09/18/15 [phone]     Chief Complaint  Patient presents with  . Vascular Access Problem     (Consider location/radiation/quality/duration/timing/severity/associated sxs/prior Treatment) HPI  [name] Is a 45 year old female with a history of diabetes, hypertension, vascular disease, bilateral AKA's, and end-stage renal disease on hemodialysis. Patient dialyzes Monday, Wednesday and Friday and last dialyzed 2 days ago. 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 She saw Dr. Imogene Burn yesterday and he repaired the upper portion of her fistula. She had a small scab in the lower portion which has previously opened twice before. The patient states that Dr. Imogene Burn told her to have the dialysis technicians stay away from that site. She was on her way to dialysis this morning when the left AV fistula began bleeding from the scab at the lower portion. She states that it was spurting blood and covered her whole body. The patient has blood in her hair and over all of her clothing. She has had previous hemorrhage from the graft site requiring blood transfusion. She called her daughter who called EMS. She states that EMS held direct pressure and she is not currently bleeding.  She does not take blood thinners. She states that her blood glucose this morning was in the 70s. She has not eaten since last night.  Past Medical History  Diagnosis Date  . GERD (gastroesophageal reflux disease)   . History of gangrene     left foot  . Hx MRSA infection   . S/P bilateral above knee amputation (HCC)   . Coronary artery disease 01/2015    single vessel disease CFX, stented 02/01/2015  . Hypertension     states has been on med. x "a long time"  . Diabetes mellitus     IDDM  . ESRF (end stage renal failure) (HCC)     dialysis M,W, F; left thigh AV graft  . High cholesterol   . No natural  teeth     does not wear dentures  . 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 Impaired mobility     bilateral AKA, is in a wheelchair   Past Surgical History  Procedure Laterality Date  . Av fistula placement  10/18/2008    right thigh AV graft  . Finger debridement  06/20/2010    right middle finger  . Finger amputation  05/27/2010    right middle  . Thrombectomy / arteriovenous graft revision  01/14/2010    thrombectomy left superficial femoral artery; left thigh AV graft placement  . 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 Leg amputation below knee  10/11/2009    right  . Central venous catheter insertion  08/06/2009    removal right IJ Diatek cath., placement left IJ Diatek cath.  . Above knee leg amputation  05/21/2009    left  . Foot amputation  04/04/2009    left transtibial amputation  . Excision / curettage bone cyst talus / calcaneus  03/04/2009    partial calcaneal exc. left; placement wound VAC  . Groin debridement  11/16/2008    right rectus femoris muscle flap to right groin wound; right groing debridement  . Femoral endarterectomy  11/06/2008    right common femoral; embolectomy right femoral artery; removal right femoral AV graft  . Central venous  catheter insertion  07/10/2008; 12/31/2006; 01/19/2006; 06/19/2005; 09/19/2004; 09/08/2004    right IJ Diatek catheter  . Av fistula repair  07/08/2008    removal left upper arm AV graft  . Thrombectomy / arteriovenous graft revision  05/11/2007;12/31/2006; 01/18/2006; 12/16/2005    left upper arm  . Dialysis fistula creation  01/20/2007    left upper arm AV graft  . Dialysis fistula creation  01/28/2006    right upper arm AV graft  . Thrombectomy / arteriovenous graft revision  10/26/2005    left forearm AV graft  . Dialysis fistula creation  10/04/2005    left forearm AV graft  . Av fistula repair  07/14/2005    removal infected right forearm AV graft  . Thrombectomy / arteriovenous graft revision  03/11/2005;  10/20/2004    right forearm AV graft  . Dialysis fistula creation  09/10/2004    right forearm AV graft  . Av fistula placement  07/18/2004    creation left AV fistula, radial to cephalic  . Finger exploration  07/13/2002    and repair left middle finger  . Carpal tunnel release  01/26/2012    Procedure: CARPAL TUNNEL RELEASE;  Surgeon: Nicki Reaper, MD;  Location: [facility];  Service: Orthopedics;  Laterality: Left;  . Esophagogastroduodenoscopy  06/26/2012    Procedure: ESOPHAGOGASTRODUODENOSCOPY (EGD);  Surgeon: Charna Elizabeth, MD;  Location: [facility] ENDOSCOPY;  Service: Endoscopy;  Laterality: N/A;  . Shuntogram N/A 11/12/2011    Procedure: Betsey Amen;  Surgeon: Fransisco Hertz, MD;  Location: [facility] CATH LAB;  Service: Cardiovascular;  Laterality: N/A;  . Cardiac catheterization N/A 02/01/2015    Procedure: Left Heart Cath and Coronary Angiography;  Surgeon: Lennette Bihari, MD;  Location: [facility] INVASIVE CV LAB;  Service: Cardiovascular;  Laterality: N/A;  . Cardiac catheterization N/A 02/01/2015    Procedure: Coronary Stent Intervention;  Surgeon: Lennette Bihari, MD;  Location: [facility] INVASIVE CV LAB;  Service: Cardiovascular;  Laterality: N/A;  . Thrombectomy and revision of arterioventous (av) goretex  graft Left 04/24/2012    thigh; replacement of venous limb   Family History  Problem Relation Age of Onset  . Diabetes Mother   . Heart disease Mother   . Hypertension Mother   . Heart attack Mother 44  . Diabetes Sister   . Hypertension Brother   . Heart attack Brother    Social History  Substance Use Topics  . Smoking status: Never Smoker   . Smokeless tobacco: Never Used  . Alcohol Use: No   OB History    No data available     Review of Systems  Ten systems reviewed and are negative for acute change, except as noted in the HPI.    Allergies  Percocet; Flagyl; Soap; and Tramadol  Home Medications   Prior to Admission medications   Medication Sig Start Date End Date Taking?  Authorizing Provider  acetaminophen (TYLENOL) 500 MG tablet Take 1,000 mg by mouth every 6 (six) hours as needed for moderate pain.    Historical Provider, MD  amLODipine (NORVASC) 10 MG tablet Take 10 mg by mouth at bedtime.    Historical Provider, MD  aspirin 81 MG chewable tablet Chew 81 mg by mouth daily.    Historical Provider, MD  atorvastatin (LIPITOR) 40 MG tablet Take 1 tablet (40 mg total) by mouth daily at 6 PM. 02/02/15   Calvert Cantor, MD  cinacalcet (SENSIPAR) 90 MG tablet Take 90 mg by mouth 3 (three) times daily with  meals.     Historical Provider, MD  HYDROcodone-acetaminophen (NORCO/VICODIN) 5-325 MG tablet Take 2 tablets by mouth every 4 (four) hours as needed. 09/08/15   Stacy Gardner, MD  insulin aspart protamine-insulin aspart (NOVOLOG 70/30) (70-30) 100 UNIT/ML injection Inject 6-10 Units into the skin 2 (two) times daily with a meal. Take 10 units in the morning and 6 units in the evening    Historical Provider, MD  metoCLOPramide (REGLAN) 5 MG tablet Take 5 mg by mouth 3 (three) times daily before meals.    Historical Provider, MD  metoprolol (LOPRESSOR) 50 MG tablet Take 1 tablet (50 mg total) by mouth 2 (two) times daily. 02/03/15   Calvert Cantor, MD  sevelamer (RENVELA) 800 MG tablet Take 3,200 mg by mouth 3 (three) times daily with meals.     Historical Provider, MD  ticagrelor (BRILINTA) 90 MG TABS tablet Take 1 tablet (90 mg total) by mouth 2 (two) times daily. 02/02/15   Calvert Cantor, MD   BP 142/67 mmHg  Pulse 91  Temp(Src) 98.6 F (37 C) (Oral)  Resp 14  SpO2 99% Physical Exam  Constitutional: She is oriented to person, place, and time. She appears well-developed and well-nourished. No distress.  Appeared older than stated age.  HENT:  Head: Normocephalic and atraumatic.  Eyes: Conjunctivae are normal. No scleral icterus.  Neck: Normal range of motion.  Cardiovascular: Normal rate, regular rhythm and normal heart sounds.  Exam reveals no gallop and no friction rub.    No murmur heard. Pulmonary/Chest: Effort normal and breath sounds normal. No respiratory distress.  Abdominal: Soft. Bowel sounds are normal. She exhibits no distension and no mass. There is no tenderness. There is no guarding.  Musculoskeletal:  BL AKAs.   Neurological: She is alert and oriented to person, place, and time.  Skin: Skin is warm and dry. She is not diaphoretic.  Nursing note and vitals reviewed.   ED Course  Procedures (including critical care time) Labs Review Labs Reviewed - No data to display  Imaging Review No results found. I have personally reviewed and evaluated these images and lab results as part of my medical decision-making.   EKG Interpretation None      MDM   Final diagnoses:  Surgical arteriovenous fistula hemorrhage, initial encounter [facility])    Patient with Ulceration of the inferior portion of her left AV fistula.  Consultation with Dr. Imogene Burn. The patient was seen outpatient by Dr. Juel Burrow , who is an interventional nephrologist. Dr. Imogene Burn asks for nephrology consult.   I've spoken with Dr. Darrick Penna, he will send his PA to evaluate the problem. This appears to me to need vascular intervention as there is an ulceration that has nothing to do with the procedure that was performed yesterday.   The nephrology PA has seen the patient and feels this is also vascular surgical issue. Vascular surgery PA will see the patient for consult.     Patient seen by vascular. Dr. Imogene Burn will evaluate, however, is still caught up in surgery.    Patient seen by Dr. Imogene Burn. She's had no repeat bleeding here in the emergency department. Unfortunately, because the patient is on Brilinta for surgical intervention. Cannot be performed, although he feels that she does need revision. Dr. Imogene Burn contacted pharmacy and states that it takes at least one week. 2 Brilinta to clear the system. He will consult with her cardiologist to manage this. She will follow up outpatient to  set her up for AV fistula revision.  In the meantime she is to use strong precautions to prevent bleeding at home. He instructed the patient to return to the emergency department immediately for any signs of bleeding. Her labs are reviewed. She does not appear to need emergent dialysis and appears safe to follow up Friday for her normal dialysis schedule. I have discussed home. Precautions with the patient. She appears safe for discharge at this time.    Arthor Captain, PA-C 09/18/15 1546  Jerelyn Scott, MD 09/19/15 0700

## 2015-09-18 NOTE — ED Notes (Signed)
MD at bedside. 

## 2015-09-18 NOTE — Discharge Instructions (Signed)
· . °  TREATMENT  The first goal of treating bleeding varicose veins is to stop the bleeding. Then, the aim is to keep any bleeding from happening again. Treatment will depend on the cause of the bleeding and how bad it is. Ask your health care provider about what would be best for you. Options include:  Raising (elevating) your leg. Lie down with your leg propped up on a pillow or cushion. Your foot should be above the level of your heart.  Applying pressure to the spot that is bleeding. The bleeding should stop in a short time.  Apply a pressure dressing  Wearing elastic stockings that "compress" your legs (compression stockings). An elastic bandage may do the same thing.   Closing off or surgically removing the bleeding varicose veins with one of the following:  Sclerotherapy. A solution is injected into the vein to close it off.  Laser treatment. A laser is used to heat the vein to close it off.  Radiofrequency vein ablation. An electrical current produced by radio waves is used to close off the vein.  Phlebectomy. The vein is surgically removed through small incisions made over the varicose vein.  Vein ligation and stripping. The vein is surgically removed through incisions made over the varicose vein after the vein has been tied (ligated).   Check your skin every day. Look for new sores and signs of bleeding.  To prevent future bleeding:  Use extra care in situations where you could cut your legs, such as when shaving or gardening.  Try to keep your legs elevated as much as possible. Lie down when you can. SEEK MEDICAL CARE IF:   Your  Fistula continue to bleed.  You develop new sores near your varicose veins.  You have a sore that does not heal or gets bigger.  You have increased pain in your leg.  The area around a varicose vein becomes warm, red, or tender to the touch.  You notice a yellowish fluid that smells bad coming from a spot where there was bleeding.  You  have a fever. SEEK IMMEDIATE MEDICAL CARE IF:   You have chest pain or difficulty breathing.  You have severe leg pain.   This information is not intended to replace advice given to you by your health care provider. Make sure you discuss any questions you have with your health care provider.   Document Released: 01/03/2009 Document Revised: 09/07/2014 Document Reviewed: 12/19/2013 Elsevier Interactive Patient Education [employer]

## 2015-09-18 NOTE — Progress Notes (Signed)
   Daily Progress Note  Asked to review this chart briefly, I did NOT see this patient yesterday as I was not in clinic.  I suspect she saw the interventional nephrologist, as complications from that procedure should be evaluated by his partners with [facility] first.   Leonides Sake, MD [facility] Office: [phone] Pager: [phone]  09/18/2015, 8:30 AM

## 2015-09-18 NOTE — Progress Notes (Signed)
Examine the pt with  ED PA-C, Arthor Captain.  Bleeding has stopped.  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  She had an intervention with PTA of venous anastamosis yesterday at [facility] by Dr. Juel Burrow.     Per Dr. Truman Hayward note 1/17 : 50-60% venous anastomosis stenosis to 10% with a 9x4 Vaccess angioplasty. Body of right thigh loop AVG (arterial lateral) with some cannulation site irregularities and a pseudoaneurysm in arterial limb with a pedunculated stalk to the main body of the grarft. IVC wide open with good arterial inflow and arterial anastomosis.  Recs Please do not cannulate that pseudoaneurysm (only skin holding the blood in). Remove suture next treatment. Sending to [facility] for an arteria limb bypass.   Discussed findings with Dr. Imogene Burn who requested films from [facility].  Have also discussed with Dr. Juel Burrow who will have them sent over shortly.  I believe she is high risk for rebleeding and unsafe to be discharged until this is repaired.  Mary Herbert, PA-C   Findings above noted and reviewed. [name] C

## 2015-09-18 NOTE — ED Notes (Signed)
MD Chen at bedside.

## 2015-09-18 NOTE — ED Notes (Signed)
Pt's graft in left upper thigh not bleeding at present, daughter at bedside.

## 2015-09-19 ENCOUNTER — Telehealth: Payer: Self-pay | Admitting: Cardiovascular Disease

## 2015-09-19 NOTE — Telephone Encounter (Signed)
Reviewed with Dr. Royann Shivers DOD, ok to hold Brilinta for 7 days for procedure.  Please do not d/c the aspirin.

## 2015-09-19 NOTE — Telephone Encounter (Signed)
Dr. Imogene Burn [facility] saw patient for problem of bleeding from left thigh.  Pt will require surgery: Revision of left thigh graft.  Okey Regal calling on behalf of physician, seeking surgical clearance & recommendation for bridging protocol - will need to be off Brilinta 7 days prior to surgery.  Wants to know recommendation for bridging.   Clearance can be sent to Attn: Okey Regal Fax - [phone]

## 2015-09-20 ENCOUNTER — Encounter: Payer: Self-pay | Admitting: *Deleted

## 2015-09-20 ENCOUNTER — Other Ambulatory Visit: Payer: Self-pay

## 2015-09-20 NOTE — Telephone Encounter (Signed)
Reviewed by Dr. Royann Shivers - clearance letter sent to provided fax #.

## 2015-09-23 NOTE — Telephone Encounter (Signed)
acknowledged

## 2015-09-26 ENCOUNTER — Encounter (HOSPITAL_COMMUNITY): Payer: Self-pay | Admitting: *Deleted

## 2015-09-26 NOTE — Progress Notes (Signed)
Pt denies SOB and chest pain but is under the care of Dr. Tresa Endo, Cardiology. Pt stated that her last dose of Brilinta was 1/23 as instructed. Pt stated that she was instructed to take no insulin on DOS and half the night prior to procedure. Pt stated that her fasting blood sugar " is usually in the 100's." Pt instructed to check BS the morning of procedure along with diabetes protocol and management for BS <70 and >220. Pt made aware to stop taking otc vitamins, fish oil, herbal medications and NSAID's. Pt verbalized understanding of all pre-op instructions. Revonda Standard, [place], Anesthesia, asked to review pt history; see note.

## 2015-09-26 NOTE — Progress Notes (Signed)
Anesthesia Chart Review: SAME DAY WORK-UP.  Patient is a 45 year old female scheduled for revision of left thigh AVGG on 10/01/15 by Dr. Darrick Penna. She has had recently bleeding for her AVGG and has a known pseudoaneurysm. She required transport to ED on 09/18/15 for AVGG bleeding. She had been on Brilinta for a cardiac stent, so needed hold this medication for 7 days prior to surgery. Cardiology was notified with recommendation to continue ASA. Dr. Royann Shivers (covering for Dr. Tresa Endo) felt she was "acceptable risk" (see Letters tab).  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 PCP is listed as Dr. Billee Cashing. Cardiologist is Dr. Nicki Guadalajara. Nephrologist is with [employer].  Meds include amlodipine, ASA 81 mg, Lipitor, Sensipar, Norco, Novolog, Reglan, Lopressor, Renvela, Brilinta (on hold).  09/10/15 EKG: NSR, T wave abnormality, consider lateral ischemia. Prolonged QT.  02/01/15 Echo: Study Conclusions - Left ventricle: Severe hypokinesis of the inferolateral wall. The other walls are vigorous. EF is 55%. The cavity size was normal. Wall thickness was increased in a pattern of mild LVH. - Left atrium: The atrium was mildly dilated. - Right ventricle: The cavity size was mildly dilated. Systolic function was mildly reduced. - Pulmonary arteries: PA peak pressure: 65 mm Hg (S). - Pericardium, extracardiac: A trivial pericardial effusion was identified posterior to the heart.  02/01/15 Cardiac cath (presented to ED with N/V, mid abdominal pain but no CP, ruled in for NSTEMI):  Prox Cx lesion, 30% stenosed.  Prox Cx to Mid Cx lesion, 99% stenosed. There is a 0% residual stenosis post intervention.  A drug-eluting stent was placed. - Elevated left ventricular end-diastolic pressure 36 mm. Left ventriculography was not performed. - Single vessel  coronary obstructive disease with 30-40% proximal left circumflex stenosis followed by 99% proximal to mid circumflex stenosis prior to a sharp proximal bend in the vessel. - Successful PCI of the 99% left circumflex stenosis with insertion of 2.515 mm Xience Alpine DES stent postdilated to 2.71 mm with the stenosis reduced to 0%.  RECOMMENDATION: The patient should be continued with dual platelet therapy for minimum of a year. She will undergo dialysis this evening, particularly with her marked elevation of left ventricular end-diastolic pressure.   09/08/15 CXR: IMPRESSION: Improved but persistent bibasilar opacities which could represent a combination of atelectasis or scarring. No evidence of pulmonary edema. Cannot exclude possibility of infectious infiltrates in the appropriate clinical setting.  09/18/15/ Labs noted. She will need a ISTAT on arrival.   If no acute changes then I anticipate that she can proceed as planned.  Velna Ochs [facility] Short Stay Center/Anesthesiology Phone [phone] 09/26/2015 10:43 AM

## 2015-09-30 MED ORDER — SODIUM CHLORIDE 0.9 % IV SOLN
INTRAVENOUS | Status: DC
  Administered 2015-10-01: 07:00:00 via INTRAVENOUS

## 2015-09-30 MED ORDER — DEXTROSE 5 % IV SOLN
1.5000 g | INTRAVENOUS | Status: AC
  Administered 2015-10-01: 1.5 g via INTRAVENOUS

## 2015-09-30 MED ORDER — CHLORHEXIDINE GLUCONATE CLOTH 2 % EX PADS: 6.0000 | MEDICATED_PAD | Freq: Once | CUTANEOUS | Status: DC

## 2015-10-01 ENCOUNTER — Encounter (HOSPITAL_COMMUNITY): Payer: Self-pay | Admitting: *Deleted

## 2015-10-01 ENCOUNTER — Ambulatory Visit (HOSPITAL_COMMUNITY): Payer: Medicare Other | Admitting: Vascular Surgery

## 2015-10-01 ENCOUNTER — Encounter (HOSPITAL_COMMUNITY)
Admission: RE | Disposition: A | Discharge status: Home / Self Care | Payer: Self-pay | Source: Ambulatory Visit | Attending: Vascular Surgery

## 2015-10-01 ENCOUNTER — Ambulatory Visit (HOSPITAL_COMMUNITY)
Admission: RE | Admit: 2015-10-01 | Discharge: 2015-10-01 | Disposition: A | Discharge status: Home / Self Care | Payer: Medicare Other | Source: Ambulatory Visit | Attending: Vascular Surgery | Admitting: Vascular Surgery

## 2015-10-01 DIAGNOSIS — N186 End stage renal disease: Secondary | ICD-10-CM | POA: Diagnosis not present

## 2015-10-01 DIAGNOSIS — I2510 Atherosclerotic heart disease of native coronary artery without angina pectoris: Secondary | ICD-10-CM | POA: Insufficient documentation

## 2015-10-01 DIAGNOSIS — Z7982 Long term (current) use of aspirin: Secondary | ICD-10-CM | POA: Insufficient documentation

## 2015-10-01 DIAGNOSIS — I120 Hypertensive chronic kidney disease with stage 5 chronic kidney disease or end stage renal disease: Secondary | ICD-10-CM | POA: Diagnosis not present

## 2015-10-01 DIAGNOSIS — Z794 Long term (current) use of insulin: Secondary | ICD-10-CM | POA: Diagnosis not present

## 2015-10-01 DIAGNOSIS — Z89611 Acquired absence of right leg above knee: Secondary | ICD-10-CM | POA: Insufficient documentation

## 2015-10-01 DIAGNOSIS — Z89612 Acquired absence of left leg above knee: Secondary | ICD-10-CM | POA: Insufficient documentation

## 2015-10-01 DIAGNOSIS — I252 Old myocardial infarction: Secondary | ICD-10-CM | POA: Diagnosis not present

## 2015-10-01 DIAGNOSIS — Z992 Dependence on renal dialysis: Secondary | ICD-10-CM | POA: Insufficient documentation

## 2015-10-01 DIAGNOSIS — E1122 Type 2 diabetes mellitus with diabetic chronic kidney disease: Secondary | ICD-10-CM | POA: Diagnosis not present

## 2015-10-01 DIAGNOSIS — T82898A Other specified complication of vascular prosthetic devices, implants and grafts, initial encounter: Secondary | ICD-10-CM | POA: Diagnosis not present

## 2015-10-01 DIAGNOSIS — Y832 Surgical operation with anastomosis, bypass or graft as the cause of abnormal reaction of the patient, or of later complication, without mention of misadventure at the time of the procedure: Secondary | ICD-10-CM | POA: Diagnosis not present

## 2015-10-01 DIAGNOSIS — Z79899 Other long term (current) drug therapy: Secondary | ICD-10-CM | POA: Diagnosis not present

## 2015-10-01 HISTORY — PX: REVISION OF ARTERIOVENOUS GORETEX GRAFT: SHX6073

## 2015-10-01 HISTORY — DX: Acute myocardial infarction, unspecified: I21.9

## 2015-10-01 LAB — POCT I-STAT 4, (NA,K, GLUC, HGB,HCT)
GLUCOSE: 80 mg/dL (ref 65–99)
HEMATOCRIT: 35 % — AB (ref 36.0–46.0)
HEMOGLOBIN: 11.9 g/dL — AB (ref 12.0–15.0)
POTASSIUM: 3.3 mmol/L — AB (ref 3.5–5.1)
SODIUM: 139 mmol/L (ref 135–145)

## 2015-10-01 LAB — GLUCOSE, CAPILLARY
GLUCOSE-CAPILLARY: 158 mg/dL — AB (ref 65–99)
Glucose-Capillary: 128 mg/dL — ABNORMAL HIGH (ref 65–99)

## 2015-10-01 LAB — APTT: APTT: 33 s (ref 24–37)

## 2015-10-01 LAB — PROTIME-INR
INR: 1.1 (ref 0.00–1.49)
PROTHROMBIN TIME: 14.4 s (ref 11.6–15.2)

## 2015-10-01 SURGERY — REVISION OF ARTERIOVENOUS GORETEX GRAFT
Anesthesia: General | Site: Leg Upper | Laterality: Left

## 2015-10-01 MED ORDER — ONDANSETRON HCL 4 MG/2ML IJ SOLN
INTRAMUSCULAR | Status: AC
  Filled 2015-10-01: qty 2

## 2015-10-01 MED ORDER — MIDAZOLAM HCL 2 MG/2ML IJ SOLN
INTRAMUSCULAR | Status: AC
  Filled 2015-10-01: qty 2

## 2015-10-01 MED ORDER — FENTANYL CITRATE (PF) 100 MCG/2ML IJ SOLN
INTRAMUSCULAR | Status: DC | PRN
  Administered 2015-10-01: 25 ug via INTRAVENOUS
  Administered 2015-10-01 (×2): 50 ug via INTRAVENOUS

## 2015-10-01 MED ORDER — LIDOCAINE HCL (CARDIAC) 20 MG/ML IV SOLN
INTRAVENOUS | Status: AC
  Filled 2015-10-01: qty 5

## 2015-10-01 MED ORDER — PROPOFOL 10 MG/ML IV BOLUS
INTRAVENOUS | Status: AC
  Filled 2015-10-01: qty 20

## 2015-10-01 MED ORDER — PROTAMINE SULFATE 10 MG/ML IV SOLN
INTRAVENOUS | Status: AC
  Filled 2015-10-01: qty 5

## 2015-10-01 MED ORDER — PROMETHAZINE HCL 25 MG/ML IJ SOLN: 6.2500 mg | INTRAMUSCULAR | Status: DC | PRN

## 2015-10-01 MED ORDER — THROMBIN 20000 UNITS EX SOLR
CUTANEOUS | Status: DC | PRN
  Administered 2015-10-01: 20 mL via TOPICAL

## 2015-10-01 MED ORDER — FENTANYL CITRATE (PF) 250 MCG/5ML IJ SOLN
INTRAMUSCULAR | Status: AC
  Filled 2015-10-01: qty 5

## 2015-10-01 MED ORDER — SODIUM CHLORIDE 0.9 % IV SOLN
INTRAVENOUS | Status: DC | PRN
  Administered 2015-10-01: 500 mL

## 2015-10-01 MED ORDER — SUCCINYLCHOLINE CHLORIDE 20 MG/ML IJ SOLN
INTRAMUSCULAR | Status: DC | PRN
  Administered 2015-10-01: 50 mg via INTRAVENOUS

## 2015-10-01 MED ORDER — MIDAZOLAM HCL 5 MG/5ML IJ SOLN
INTRAMUSCULAR | Status: DC | PRN
  Administered 2015-10-01: 2 mg via INTRAVENOUS

## 2015-10-01 MED ORDER — HYDROMORPHONE HCL 1 MG/ML IJ SOLN
INTRAMUSCULAR | Status: AC
  Filled 2015-10-01: qty 1

## 2015-10-01 MED ORDER — 0.9 % SODIUM CHLORIDE (POUR BTL) OPTIME
TOPICAL | Status: DC | PRN
  Administered 2015-10-01: 1000 mL

## 2015-10-01 MED ORDER — PHENYLEPHRINE HCL 10 MG/ML IJ SOLN
INTRAMUSCULAR | Status: DC | PRN
  Administered 2015-10-01 (×4): 80 ug via INTRAVENOUS

## 2015-10-01 MED ORDER — ONDANSETRON HCL 4 MG/2ML IJ SOLN
INTRAMUSCULAR | Status: DC | PRN
  Administered 2015-10-01: 4 mg via INTRAVENOUS

## 2015-10-01 MED ORDER — CEFUROXIME SODIUM 1.5 G IJ SOLR
INTRAMUSCULAR | Status: AC
  Filled 2015-10-01: qty 1.5

## 2015-10-01 MED ORDER — HYDROCODONE-ACETAMINOPHEN 5-325 MG PO TABS: 2.0000 | ORAL_TABLET | ORAL | Status: DC | PRN

## 2015-10-01 MED ORDER — PHENYLEPHRINE 40 MCG/ML (10ML) SYRINGE FOR IV PUSH (FOR BLOOD PRESSURE SUPPORT)
PREFILLED_SYRINGE | INTRAVENOUS | Status: AC
  Filled 2015-10-01: qty 10

## 2015-10-01 MED ORDER — LIDOCAINE HCL (PF) 1 % IJ SOLN
INTRAMUSCULAR | Status: AC
  Filled 2015-10-01: qty 30

## 2015-10-01 MED ORDER — HEPARIN SODIUM (PORCINE) 1000 UNIT/ML IJ SOLN
INTRAMUSCULAR | Status: DC | PRN
  Administered 2015-10-01: 5000 [IU] via INTRAVENOUS

## 2015-10-01 MED ORDER — THROMBIN 20000 UNITS EX SOLR
CUTANEOUS | Status: AC
  Filled 2015-10-01: qty 20000

## 2015-10-01 MED ORDER — PROPOFOL 10 MG/ML IV BOLUS
INTRAVENOUS | Status: AC
  Filled 2015-10-01: qty 40

## 2015-10-01 MED ORDER — LIDOCAINE HCL (CARDIAC) 20 MG/ML IV SOLN
INTRAVENOUS | Status: DC | PRN
  Administered 2015-10-01: 40 mg via INTRAVENOUS

## 2015-10-01 MED ORDER — PROPOFOL 10 MG/ML IV BOLUS
INTRAVENOUS | Status: DC | PRN
  Administered 2015-10-01: 50 mg via INTRAVENOUS
  Administered 2015-10-01: 110 mg via INTRAVENOUS

## 2015-10-01 MED ORDER — SODIUM CHLORIDE 0.9 % IJ SOLN
INTRAMUSCULAR | Status: AC
  Filled 2015-10-01: qty 10

## 2015-10-01 MED ORDER — DEXTROSE 50 % IV SOLN
INTRAVENOUS | Status: AC
  Administered 2015-10-01: 25 mL via INTRAVENOUS
  Filled 2015-10-01: qty 50

## 2015-10-01 MED ORDER — EPHEDRINE SULFATE 50 MG/ML IJ SOLN
INTRAMUSCULAR | Status: AC
  Filled 2015-10-01: qty 1

## 2015-10-01 MED ORDER — SUCCINYLCHOLINE CHLORIDE 20 MG/ML IJ SOLN
INTRAMUSCULAR | Status: AC
  Filled 2015-10-01: qty 1

## 2015-10-01 MED ORDER — HYDROMORPHONE HCL 1 MG/ML IJ SOLN
0.2500 mg | INTRAMUSCULAR | Status: DC | PRN
  Administered 2015-10-01 (×2): 0.5 mg via INTRAVENOUS

## 2015-10-01 MED ORDER — PROTAMINE SULFATE 10 MG/ML IV SOLN
INTRAVENOUS | Status: DC | PRN
  Administered 2015-10-01 (×2): 20 mg via INTRAVENOUS
  Administered 2015-10-01: 10 mg via INTRAVENOUS

## 2015-10-01 MED ORDER — ROCURONIUM BROMIDE 50 MG/5ML IV SOLN
INTRAVENOUS | Status: AC
  Filled 2015-10-01: qty 1

## 2015-10-01 SURGICAL SUPPLY — 37 items
CANISTER SUCTION 2500CC (MISCELLANEOUS) ×2 IMPLANT
CANNULA VESSEL 3MM 2 BLNT TIP (CANNULA) ×1 IMPLANT
CLIP TI MEDIUM 6 (CLIP) ×2 IMPLANT
CLIP TI WIDE RED SMALL 6 (CLIP) ×2 IMPLANT
DECANTER SPIKE VIAL GLASS SM (MISCELLANEOUS) ×2 IMPLANT
DRAPE INCISE IOBAN 66X45 STRL (DRAPES) ×1 IMPLANT
DRAPE ORTHO SPLIT 77X108 STRL (DRAPES) ×2
DRAPE SURG ORHT 6 SPLT 77X108 (DRAPES) IMPLANT
ELECT REM PT RETURN 9FT ADLT (ELECTROSURGICAL) ×2
ELECTRODE REM PT RTRN 9FT ADLT (ELECTROSURGICAL) ×1 IMPLANT
GEL ULTRASOUND 20GR AQUASONIC (MISCELLANEOUS) IMPLANT
GLOVE BIO SURGEON STRL SZ 6.5 (GLOVE) ×2 IMPLANT
GLOVE BIO SURGEON STRL SZ7.5 (GLOVE) ×2 IMPLANT
GLOVE BIOGEL PI IND STRL 6.5 (GLOVE) IMPLANT
GLOVE BIOGEL PI INDICATOR 6.5 (GLOVE) ×3
GLOVE ECLIPSE 6.5 STRL STRAW (GLOVE) ×1 IMPLANT
GOWN STRL REUS W/ TWL LRG LVL3 (GOWN DISPOSABLE) ×3 IMPLANT
GOWN STRL REUS W/TWL LRG LVL3 (GOWN DISPOSABLE) ×6
GRAFT GORETEX STND 6X20 (Vascular Products) ×2 IMPLANT
GRAFT GORETEXSTD 6X20 (Vascular Products) IMPLANT
KIT BASIN OR (CUSTOM PROCEDURE TRAY) ×2 IMPLANT
KIT ROOM TURNOVER OR (KITS) ×2 IMPLANT
LIQUID BAND (GAUZE/BANDAGES/DRESSINGS) ×2 IMPLANT
LOOP VESSEL MINI RED (MISCELLANEOUS) IMPLANT
NDL HYPO 25GX1X1/2 BEV (NEEDLE) ×1 IMPLANT
NEEDLE HYPO 25GX1X1/2 BEV (NEEDLE) ×2 IMPLANT
NS IRRIG 1000ML POUR BTL (IV SOLUTION) ×2 IMPLANT
PACK CV ACCESS (CUSTOM PROCEDURE TRAY) ×2 IMPLANT
PAD ARMBOARD 7.5X6 YLW CONV (MISCELLANEOUS) ×4 IMPLANT
SPONGE SURGIFOAM ABS GEL 100 (HEMOSTASIS) IMPLANT
SUT ETHILON 3 0 PS 1 (SUTURE) ×2 IMPLANT
SUT PROLENE 6 0 CC (SUTURE) ×2 IMPLANT
SUT VIC AB 3-0 SH 27 (SUTURE) ×2
SUT VIC AB 3-0 SH 27X BRD (SUTURE) ×1 IMPLANT
SUT VICRYL 4-0 PS2 18IN ABS (SUTURE) ×2 IMPLANT
UNDERPAD 30X30 INCONTINENT (UNDERPADS AND DIAPERS) ×2 IMPLANT
WATER STERILE IRR 1000ML POUR (IV SOLUTION) ×2 IMPLANT

## 2015-10-01 NOTE — Op Note (Signed)
Procedure: Revision left thigh AV graft  Preoperative diagnosis: Aneurysmal degeneration left thigh AV graft  Postoperative diagnosis: Same  Anesthesia: General  Asst. Samantha Rhyne PA-C  Operative findings: #1   6 mm PTFE interposition graft left thigh  Operative details: After obtaining informed consent, the patient was taken to the operating room. The patient was placed in supine position on the operating room table. After induction of general anesthesia and placement of endotracheal tube the patient's entire left thigh was prepped and draped in a sterile fashion. Next a transverse incision was made just above and below a graft aneurysm that was in the lateral aspect of the left thigh. Each incision was carried down through the subcutaneous tissues down to level. The graft was dissected free circumferentially. The patient was given 5,000 units of intravenous heparin. The graft was clamped in the proximal incision as well as the distal incision with a fistula clamp. The graft was then divided in the proximal incision. There was some intimal hyperplasia  and degeneration of the graft but there was good arterial inflow. A new 6 mm graft was tunneled subcutaneously lateral to the old graft. This was then sewn end-to-end to the proximal graft using running 6-0 Prolene suture. The interposition graft was cut to length and sewn end-to-end to the venous limb of the graft again with a running 6-0 Prolene suture. Just prior to completion of the anastomosis everything was forebled backbled and thoroughly flushed. Anastomosis was secured, clamps released, and there was flow in the graft immediately. Hemostasis was obtained with thrombin Gelfoam and the assistance of 50 mg of protamine. Subcutaneous tissues of both incisions reapproximated with a running 3-0 Vicryl suture. Skin of both incisions was closed with 4 0 Vicryl subcuticular stitch. Dermabond was applied incisions. Next, there was an ulcerated aneurysmal  segment of graft that had now been disconnected.  I made an elliptical incision around the ulcer and carried it through the subcutaneous tissues to the graft.  This was dissected free circumferentially transected and excised.  Hemostasis was obtained and the skin reapproximated using 3 0 nylon sutures. Patient tolerated the procedure well and there were no complications. Instrument, sponge and needle counts were correct at the end of the case. The patient was taken to the recovery room in stable condition.  Fabienne Bruns, MD [facility] Office: [phone] Pager: [phone]

## 2015-10-01 NOTE — Anesthesia Postprocedure Evaluation (Signed)
Anesthesia Post Note  Patient: [name]  Procedure(s) Performed: Procedure(s) (LRB): REVISION OF ARTERIOVENOUS GORETEX GRAFT-LEFT THIGH (Left)  Patient location during evaluation: PACU Anesthesia Type: General Level of consciousness: awake and alert Pain management: pain level controlled Vital Signs Assessment: post-procedure vital signs reviewed and stable Respiratory status: spontaneous breathing, nonlabored ventilation, respiratory function stable and patient connected to nasal cannula oxygen Cardiovascular status: blood pressure returned to baseline and stable Postop Assessment: no signs of nausea or vomiting Anesthetic complications: no    Last Vitals:  Filed Vitals:   10/01/15 0623 10/01/15 0953  BP: 137/64   Pulse: 69   Temp: 37.4 C 37.1 C  Resp: 18     Last Pain:  Filed Vitals:   10/01/15 0959  PainSc: Asleep                 Teagen Bucio S

## 2015-10-01 NOTE — Transfer of Care (Signed)
Immediate Anesthesia Transfer of Care Note  Patient: [name]  Procedure(s) Performed: Procedure(s): REVISION OF ARTERIOVENOUS GORETEX GRAFT-LEFT THIGH (Left)  Patient Location: PACU  Anesthesia Type:General  Level of Consciousness: awake, patient cooperative and responds to stimulation, drowsy  Airway & Oxygen Therapy: Patient Spontanous Breathing and Patient connected to nasal cannula oxygen  Post-op Assessment: Report given to RN and Post -op Vital signs reviewed and stable  Post vital signs: Reviewed and stable  Last Vitals:  Filed Vitals:   10/01/15 0623  BP: 137/64  Pulse: 69  Temp: 37.4 C  Resp: 18    Complications: No apparent anesthesia complications

## 2015-10-01 NOTE — Anesthesia Preprocedure Evaluation (Addendum)
Anesthesia Evaluation  Patient identified by MRN, date of birth, ID band Patient awake    Reviewed: Allergy & Precautions, NPO status , Patient's Chart, lab work & pertinent test results  Airway Mallampati: II  TM Distance: >3 FB Neck ROM: Full    Dental no notable dental hx. (+) Edentulous Upper, Edentulous Lower, Dental Advisory Given   Pulmonary neg pulmonary ROS,    Pulmonary exam normal breath sounds clear to auscultation       Cardiovascular hypertension, Pt. on medications and Pt. on home beta blockers + CAD (single vessel disease, SFX stented 02/01/15), + Past MI and + Peripheral Vascular Disease  Normal cardiovascular exam Rhythm:Regular Rate:Normal     Neuro/Psych  Neuromuscular disease (median nerve lesion) negative neurological ROS  negative psych ROS   GI/Hepatic Neg liver ROS, GERD  ,  Endo/Other  diabetes  Renal/GU ESRF and DialysisRenal disease  negative genitourinary   Musculoskeletal negative musculoskeletal ROS (+)   Abdominal   Peds negative pediatric ROS (+)  Hematology negative hematology ROS (+)   Anesthesia Other Findings   Reproductive/Obstetrics negative OB ROS                            Anesthesia Physical Anesthesia Plan  ASA: III  Anesthesia Plan: General   Post-op Pain Management:    Induction: Intravenous  Airway Management Planned: Oral ETT and LMA  Additional Equipment:   Intra-op Plan:   Post-operative Plan: Extubation in OR  Informed Consent: I have reviewed the patients History and Physical, chart, labs and discussed the procedure including the risks, benefits and alternatives for the proposed anesthesia with the patient or authorized representative who has indicated his/her understanding and acceptance.   Dental advisory given  Plan Discussed with: CRNA and Surgeon  Anesthesia Plan Comments:         Anesthesia Quick Evaluation

## 2015-10-01 NOTE — H&P (View-Only) (Signed)
   CONSULT NOTE   MRN : [id number]  Reason for Consult: Left bleeding thigh graft  Referring Physician: ED   History of Present Illness: 44 y/o female with long ESRD on HD.  She states that for the last month she has had prolonged bleeding from her left thigh graft site.  It has taken over an hour for the stick sites to stop bleeding after HD.  She was referred to Dr. Lin who performed a procedure and stated there was an aneurysm per the patient.  She has not had HD since Mon. And when she woke up this morning her thigh was bleeding.  She was transported via EMS to [place].  Since she was here in the ED there has not been any bleeding around her graft site.    The last revision that was performed on her left AV thigh graft was Replacement of venous limb of the AV Gore-Tex graft left femoral loop.     No current facility-administered medications for this encounter.   Current Outpatient Prescriptions  Medication Sig Dispense Refill  . acetaminophen (TYLENOL) 500 MG tablet Take 1,000 mg by mouth every 6 (six) hours as needed for moderate pain.    . amLODipine (NORVASC) 10 MG tablet Take 10 mg by mouth at bedtime.    . aspirin 81 MG chewable tablet Chew 81 mg by mouth daily.    . atorvastatin (LIPITOR) 40 MG tablet Take 1 tablet (40 mg total) by mouth daily at 6 PM. 30 tablet 0  . cinacalcet (SENSIPAR) 90 MG tablet Take 90 mg by mouth 3 (three) times daily with meals.     . HYDROcodone-acetaminophen (NORCO/VICODIN) 5-325 MG tablet Take 2 tablets by mouth every 4 (four) hours as needed. 20 tablet 0  . insulin aspart protamine-insulin aspart (NOVOLOG 70/30) (70-30) 100 UNIT/ML injection Inject 6-10 Units into the skin 2 (two) times daily with a meal. Take 10 units in the morning and 6 units in the evening    . metoCLOPramide (REGLAN) 5 MG tablet Take 5 mg by mouth 3 (three) times daily before meals.    . metoprolol (LOPRESSOR) 50 MG tablet Take 1 tablet (50 mg total) by mouth 2 (two)  times daily. 60 tablet 0  . sevelamer (RENVELA) 800 MG tablet Take 3,200 mg by mouth 3 (three) times daily with meals.     . ticagrelor (BRILINTA) 90 MG TABS tablet Take 1 tablet (90 mg total) by mouth 2 (two) times daily. 60 tablet 0      Past Medical History  Diagnosis Date  . GERD (gastroesophageal reflux disease)   . History of gangrene     left foot  . Hx MRSA infection   . S/P bilateral above knee amputation (HCC)   . Coronary artery disease 01/2015    single vessel disease CFX, stented 02/01/2015  . Hypertension     states has been on med. x "a long time"  . Diabetes mellitus     IDDM  . ESRF (end stage renal failure) (HCC)     dialysis M,W, F; left thigh AV graft  . High cholesterol   . 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 Impaired mobility     bilateral AKA, is in a wheelchair    Past Surgical History  Procedure Laterality Date  . Av fistula placement  10/18/2008    right thigh   AV graft  . Finger debridement  06/20/2010    right middle finger  . Finger amputation  05/27/2010    right middle  . Thrombectomy / arteriovenous graft revision  01/14/2010    thrombectomy left superficial femoral artery; left thigh AV graft placement  . 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 Leg amputation below knee  10/11/2009    right  . Central venous catheter insertion  08/06/2009    removal right IJ Diatek cath., placement left IJ Diatek cath.  . Above knee leg amputation  05/21/2009    left  . Foot amputation  04/04/2009    left transtibial amputation  . Excision / curettage bone cyst talus / calcaneus  03/04/2009    partial calcaneal exc. left; placement wound VAC  . Groin debridement  11/16/2008    right rectus femoris muscle flap to right groin wound; right groing debridement  . Femoral endarterectomy  11/06/2008    right common femoral; embolectomy right femoral artery; removal right femoral AV graft  .  Central venous catheter insertion  07/10/2008; 12/31/2006; 01/19/2006; 06/19/2005; 09/19/2004; 09/08/2004    right IJ Diatek catheter  . Av fistula repair  07/08/2008    removal left upper arm AV graft  . Thrombectomy / arteriovenous graft revision  05/11/2007;12/31/2006; 01/18/2006; 12/16/2005    left upper arm  . Dialysis fistula creation  01/20/2007    left upper arm AV graft  . Dialysis fistula creation  01/28/2006    right upper arm AV graft  . Thrombectomy / arteriovenous graft revision  10/26/2005    left forearm AV graft  . Dialysis fistula creation  10/04/2005    left forearm AV graft  . Av fistula repair  07/14/2005    removal infected right forearm AV graft  . Thrombectomy / arteriovenous graft revision  03/11/2005; 10/20/2004    right forearm AV graft  . Dialysis fistula creation  09/10/2004    right forearm AV graft  . Av fistula placement  07/18/2004    creation left AV fistula, radial to cephalic  . Finger exploration  07/13/2002    and repair left middle finger  . Carpal tunnel release  01/26/2012    Procedure: CARPAL TUNNEL RELEASE;  Surgeon: Gary R Kuzma, MD;  Location: [facility];  Service: Orthopedics;  Laterality: Left;  . Esophagogastroduodenoscopy  06/26/2012    Procedure: ESOPHAGOGASTRODUODENOSCOPY (EGD);  Surgeon: Jyothi Mann, MD;  Location: [facility];  Service: Endoscopy;  Laterality: N/A;  . Shuntogram N/A 11/12/2011    Procedure: SHUNTOGRAM;  Surgeon: Brian L Chen, MD;  Location: [facility];  Service: Cardiovascular;  Laterality: N/A;  . Cardiac catheterization N/A 02/01/2015    Procedure: Left Heart Cath and Coronary Angiography;  Surgeon: Thomas A Kelly, MD;  Location: [facility];  Service: Cardiovascular;  Laterality: N/A;  . Cardiac catheterization N/A 02/01/2015    Procedure: Coronary Stent Intervention;  Surgeon: Thomas A Kelly, MD;  Location: [facility];  Service: Cardiovascular;  Laterality: N/A;  . Thrombectomy and revision of  arterioventous (av) goretex  graft Left 04/24/2012    thigh; replacement of venous limb    Social History Social History  Substance Use Topics  . Smoking status: Never Smoker   . Smokeless tobacco: Never Used  . Alcohol Use: No    Family History Family History  Problem Relation Age of Onset  . Diabetes Mother   . Heart disease Mother   . Hypertension   Mother   . Heart attack Mother 64  . Diabetes Sister   . Hypertension Brother   . Heart attack Brother     Allergies  Allergen Reactions  . Percocet [Oxycodone-Acetaminophen] Other (See Comments)    GI UPSET  . Flagyl [Metronidazole] Other (See Comments)  . Soap Itching    IVORY SOAP  . Tramadol Itching     REVIEW OF SYSTEMS  General: [ ] Weight loss, [ ] Fever, [ ] chills Neurologic: [ ] Dizziness, [ ] Blackouts, [ ] Seizure [ ] Stroke, [ ] "Mini stroke", [ ] Slurred speech, [ ] Temporary blindness; [ ] weakness in arms or legs, [ ] Hoarseness [ ] Dysphagia Cardiac: [ ] Chest pain/pressure, [ ] Shortness of breath at rest [ ] Shortness of breath with exertion, [ ] Atrial fibrillation or irregular heartbeat  Vascular: [ ] Pain in legs with walking, [ ] Pain in legs at rest, [ ] Pain in legs at night,  [ ] Non-healing ulcer, [ ] Blood clot in vein/DVT,   Pulmonary: [ ] Home oxygen, [ ] Productive cough, [ ] Coughing up blood, [ ] Asthma,  [ ] Wheezing [ ] COPD Musculoskeletal:  [ ] Arthritis, [ ] Low back pain, [ ] Joint pain Hematologic: [ ] Easy Bruising, [ ] Anemia; [ ] Hepatitis Gastrointestinal: [ ] Blood in stool, [ ] Gastroesophageal Reflux/heartburn, Urinary: [ x] chronic Kidney disease, [ ] on HD - [ x] MWF or [ ] TTHS, [ ] Burning with urination, [ ] Difficulty urinating Skin: [ ] Rashes, [ ] Wounds Psychological: [ ] Anxiety, [ ] Depression  Physical Examination Filed Vitals:   09/18/15 0609 09/18/15 0900  BP: 142/67 123/68  Pulse: 91 65  Temp: 98.6 F (37 C)   TempSrc: Oral   Resp: 14 17  SpO2: 99%  96%   There is no weight on file to calculate BMI.  General:  WDWN in NAD Gait: Normal HENT: WNL Eyes: Pupils equal Pulmonary: normal non-labored breathing , without Rales, rhonchi,  wheezing Cardiac: RRR, without  Murmurs, rubs or gallops; No carotid bruits Abdomen: soft, NT, no masses Skin: no rashes, ulcers noted;  no Gangrene , no cellulitis; no open wounds;  Vascular Exam/Pulses:palpable thrill left thigh AVG, no bleeding surrounding the graft.  No pulsatile mass. Musculoskeletal: Bilateral AKA well healed Neurologic: A&O X 3; Appropriate Affect ; SENSATION: normal; MOTOR FUNCTION: 5/5 Symmetric UE; Speech is fluent/normal   Significant Diagnostic Studies: CBC Lab Results  Component Value Date   WBC 8.9 09/18/2015   HGB 9.3* 09/18/2015   HCT 29.2* 09/18/2015   MCV 86.4 09/18/2015   PLT 460* 09/18/2015    BMET    Component Value Date/Time   NA 139 09/18/2015 0951   K 3.5 09/18/2015 0951   CL 96* 09/18/2015 0951   CO2 28 09/18/2015 0951   GLUCOSE 79 09/18/2015 0951   BUN 15 09/18/2015 0951   CREATININE 7.10* 09/18/2015 0951   CALCIUM 8.9 09/18/2015 0951   GFRNONAA 6* 09/18/2015 0951   GFRAA 7* 09/18/2015 0951   CrCl cannot be calculated (Unknown ideal weight.).  COAG Lab Results  Component Value Date   INR 1.11 02/01/2015   INR 1.04 06/18/2012   INR 1.19 04/24/2012     Non-Invasive Vascular Imaging:   ASSESSMENT/PLAN:  History of bleeding graft left thigh av graft We need to see the studies from Dr. Lin's office.  Since the graft is not bleeding, this could   be worked up as an outpatient.   I took a CD to Dr. Chen of Dr. Lin's procedure for review.  Pending this she maybe discharged home today.   COLLINS, EMMA MAUREEN 09/18/2015 10:51 AM   Addendum  I have independently interviewed and examined the patient, and I agree with the physician assistant's findings.  There is an area of attenuated skin overlying the arterial limb of this AVG compatible  with a pseudoaneurysm.  Unfortunately, the Interventional Nephrology study cannot be viewed.  Patient will need revision of this left thigh AVG based on clinical exam.  Unfortunately, this patient is on Brilinta for her cardiac stent, so she is NOT a surgical candidate as Brilinta can NOT be easily reversed, even with platelet transfusion.  The [facility] main pharmacy is recommending a 7 days period of reversal prior to proceeding with thigh AVG revision.  Will have to get in touch with Cardiology to determine the replacement regimen they want.    - In event of severe bleeding, will have to oversew the thigh graft externally and revise the graft later on once the Brilinta is reversed. - I gave the patient explicit instructions in regards to single finger occlusion of any bleeding and call EMS. - My office will contact Cardiology to determine bridging regimen which coming off Brilinta.  Will then also scheduled L thigh AVG revision with interposition graft for 7 days after Brilinta held.  Brian Chen, MD [facility] Office: [phone] Pager: [phone]  09/18/2015, 3:00 PM     

## 2015-10-01 NOTE — Progress Notes (Signed)
CBG of 80 noted Dr Okey Dupre informed. CRNA in room and received orders for d50.

## 2015-10-01 NOTE — Discharge Instructions (Signed)
° ° °  10/01/2015 [name] [id number] Jan 29, 1971  Surgeon(s): Sherren Kerns, MD  Procedure(s): REVISION OF ARTERIOVENOUS GORETEX GRAFT-LEFT THIGH  x May stick graft on designated area only:  Do NOT stick over lateral portion of graft & over incisions x 4 months.  May stick medial side of graft immediately. SEE DIAGRAM.

## 2015-10-01 NOTE — Interval H&P Note (Signed)
History and Physical Interval Note:  10/01/2015 7:18 AM  [name]  has presented today for surgery, with the diagnosis of Bleeding Left Thigh Arteriovenous Graft T82.898  The various methods of treatment have been discussed with the patient and family. After consideration of risks, benefits and other options for treatment, the patient has consented to  Procedure(s): REVISION OF ARTERIOVENOUS GORETEX GRAFT-LEFT THIGH (Left) as a surgical intervention .  The patient's history has been reviewed, patient examined, no change in status, stable for surgery.  I have reviewed the patient's chart and labs.  Questions were answered to the patient's satisfaction.     Fabienne Bruns

## 2015-10-01 NOTE — Anesthesia Procedure Notes (Addendum)
Procedure Name: Intubation Date/Time: 10/01/2015 7:39 AM Performed by: Faustino Congress Mico Spark Pre-anesthesia Checklist: Patient identified, Emergency Drugs available, Suction available and Patient being monitored Patient Re-evaluated:Patient Re-evaluated prior to inductionOxygen Delivery Method: Circle system utilized Preoxygenation: Pre-oxygenation with 100% oxygen Intubation Type: IV induction Ventilation: Mask ventilation without difficulty Laryngoscope Size: Mac and 3 Grade View: Grade II Tube type: Oral Tube size: 7.5 mm Number of attempts: 1 Airway Equipment and Method: Stylet Placement Confirmation: ETT inserted through vocal cords under direct vision,  positive ETCO2 and breath sounds checked- equal and bilateral Secured at: 22 cm Tube secured with: Tape Dental Injury: Teeth and Oropharynx as per pre-operative assessment  Comments: LMA size 4 unable to seat property despite attempts x3, copious amount of oral secretions, decision made to use oral ETT. DL x1 with MAC 3, grade 2 view, atraumatic insertion of 7.5 ETT. BBS=, +ETCO2, tube secured.

## 2015-10-02 ENCOUNTER — Encounter (HOSPITAL_COMMUNITY): Payer: Self-pay | Admitting: Vascular Surgery

## 2015-10-03 ENCOUNTER — Telehealth: Payer: Self-pay | Admitting: Vascular Surgery

## 2015-10-03 NOTE — Telephone Encounter (Signed)
LM with pt re appt, dpm

## 2015-10-03 NOTE — Telephone Encounter (Signed)
-----   Message from Sharee Pimple, RN sent at 10/01/2015 11:18 AM EST ----- Regarding: schedule   ----- Message -----    From: Dara Lords, PA-C    Sent: 10/01/2015   9:59 AM      To: [facility] Charge Pool  S/p revision of left thigh graft 10/01/15.  F/u with Dr. Darrick Penna in 2 weeks.  She will need sutures removed.  Thanks, Lelon Mast

## 2015-10-10 ENCOUNTER — Encounter: Payer: Self-pay | Admitting: Vascular Surgery

## 2015-10-17 ENCOUNTER — Encounter: Payer: Self-pay | Admitting: Vascular Surgery

## 2015-10-17 ENCOUNTER — Ambulatory Visit (INDEPENDENT_AMBULATORY_CARE_PROVIDER_SITE_OTHER): Payer: Medicare Other | Admitting: Vascular Surgery

## 2015-10-17 VITALS — BP 169/81 | HR 69 | Temp 98.1°F | Resp 16 | Ht 63.0 in | Wt 152.0 lb

## 2015-10-17 DIAGNOSIS — Z992 Dependence on renal dialysis: Secondary | ICD-10-CM

## 2015-10-17 DIAGNOSIS — N186 End stage renal disease: Secondary | ICD-10-CM

## 2015-10-17 MED ORDER — CEPHALEXIN 500 MG PO CAPS: 500.0000 mg | ORAL_CAPSULE | Freq: Every day | ORAL | Status: AC

## 2015-10-17 NOTE — Progress Notes (Signed)
Patient is a 45 year old female who returns for follow-up today. 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 She states her graft is working well. She reports a small area of drainage where her old graft was removed. She denies fever or chills.  Physical exam:  Filed Vitals:   10/17/15 1504 10/17/15 1509  BP: 169/82 169/81  Pulse: 70 69  Temp: 98.1 F (36.7 C)   TempSrc: Oral   Resp: 16   Height:  (1.6 m)   Weight: 152 lb (68.947 kg)   SpO2: 96%     Left lower extremity: Incision closed with nylon sutures from graft removal site is intact there is a 1 mm opening with some serous drainage at the inferior aspect of this. No surrounding erythema. Her new graft is lateral to this and there is an audible bruit within it. The other 2 incisions from the placement of a new graft are well-healed with no erythema or drainage.  Assessment: Doing well status post revision left thigh graft. The area were her old graft was removed is having a small amount of drainage. She was placed on Keflex today for a 2 week course. She'll follow-up with me in 2 weeks' time to make sure this has completely healed. The dialysis center should be able to access the entire graft after she has seen me in follow-up in 2 weeks.  Fabienne Bruns, MD [facility] Office: [phone] Pager: [phone]

## 2015-10-17 NOTE — Progress Notes (Signed)
Filed Vitals:   10/17/15 1504 10/17/15 1509  BP: 169/82 169/81  Pulse: 70 69  Temp: 98.1 F (36.7 C)   TempSrc: Oral   Resp: 16   Height:  (1.6 m)   Weight: 152 lb (68.947 kg)   SpO2: 96%

## 2015-10-24 ENCOUNTER — Encounter: Payer: Self-pay | Admitting: Vascular Surgery

## 2015-10-31 ENCOUNTER — Encounter: Payer: Self-pay | Admitting: Vascular Surgery

## 2015-10-31 ENCOUNTER — Ambulatory Visit (INDEPENDENT_AMBULATORY_CARE_PROVIDER_SITE_OTHER): Payer: Medicare Other | Admitting: Vascular Surgery

## 2015-10-31 VITALS — BP 182/83 | HR 71 | Temp 97.1°F | Resp 16 | Ht 63.0 in | Wt 152.0 lb

## 2015-10-31 DIAGNOSIS — N186 End stage renal disease: Secondary | ICD-10-CM

## 2015-10-31 DIAGNOSIS — Z992 Dependence on renal dialysis: Secondary | ICD-10-CM

## 2015-10-31 NOTE — Progress Notes (Signed)
Patient is a 45 year old female who returns for follow-up today. 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 She states her graft is working well. She reports a small area of drainage where her old graft was removed. She denies fever or chills.  Physical exam:    Filed Vitals:   10/31/15 1620 10/31/15 1622  BP: 178/81 182/83  Pulse: 71 71  Temp: 97.1 F (36.2 C)   TempSrc: Oral   Resp: 16   Height:  (1.6 m)   Weight: 152 lb (68.947 kg)   SpO2: 97%     Left lower extremity: All incisions healed no drainage no erythema audible bruit and graft Assessment: Doing well status post revision left thigh graft. She will follow-up on as-needed basis. Okay to start cannulating graft next week.  Fabienne Bruns, MD [facility] Office: [phone] Pager: [phone]

## 2015-10-31 NOTE — Progress Notes (Signed)
Filed Vitals:   10/31/15 1620 10/31/15 1622  BP: 178/81 182/83  Pulse: 71 71  Temp: 97.1 F (36.2 C)   TempSrc: Oral   Resp: 16   Height:  (1.6 m)   Weight: 152 lb (68.947 kg)   SpO2: 97%

## 2016-01-07 ENCOUNTER — Telehealth: Payer: Self-pay | Admitting: Cardiovascular Disease

## 2016-01-07 NOTE — Telephone Encounter (Signed)
She needs appt for June please.

## 2016-01-08 ENCOUNTER — Telehealth: Payer: Self-pay | Admitting: Cardiovascular Disease

## 2016-01-09 NOTE — Telephone Encounter (Signed)
Close encounter 

## 2016-01-21 ENCOUNTER — Ambulatory Visit: Payer: Medicare Other | Admitting: Cardiovascular Disease

## 2016-01-21 ENCOUNTER — Ambulatory Visit (INDEPENDENT_AMBULATORY_CARE_PROVIDER_SITE_OTHER): Payer: Medicare Other | Admitting: Cardiovascular Disease

## 2016-01-21 ENCOUNTER — Encounter: Payer: Self-pay | Admitting: Cardiovascular Disease

## 2016-01-21 VITALS — BP 129/77 | HR 71

## 2016-01-21 DIAGNOSIS — N186 End stage renal disease: Secondary | ICD-10-CM | POA: Diagnosis not present

## 2016-01-21 DIAGNOSIS — Z992 Dependence on renal dialysis: Secondary | ICD-10-CM

## 2016-01-21 DIAGNOSIS — I10 Essential (primary) hypertension: Secondary | ICD-10-CM | POA: Diagnosis not present

## 2016-01-21 DIAGNOSIS — I214 Non-ST elevation (NSTEMI) myocardial infarction: Secondary | ICD-10-CM

## 2016-01-21 DIAGNOSIS — I2510 Atherosclerotic heart disease of native coronary artery without angina pectoris: Secondary | ICD-10-CM | POA: Diagnosis not present

## 2016-01-21 DIAGNOSIS — Z9861 Coronary angioplasty status: Secondary | ICD-10-CM

## 2016-01-21 NOTE — Patient Instructions (Signed)
Your physician has requested that you have a lexiscan myoview. For further information please visit [email or web address] Please follow instruction sheet, as given.   Your physician wants you to follow-up in: 6 months or sooner if needed. You will receive a reminder letter in the mail two months in advance. If you don't receive a letter, please call our office to schedule the follow-up appointment.  If you need a refill on your cardiac medications before your next appointment, please call your pharmacy.

## 2016-01-23 ENCOUNTER — Encounter: Payer: Self-pay | Admitting: Cardiovascular Disease

## 2016-01-23 NOTE — Progress Notes (Signed)
Patient ID: [name], female   DOB: [birth year], 45 y.o.   MRN: [id number]     HPI: [name] is a 45 y.o. female who presents to the office today for a 4 month follow up cardiology evaluation.  [name] has a long-standing history of diabetes mellitus, end-stage renal disease on dialysis, peripheral vascular disease, and is status post bilateral above-the-knee amputations.  On 02/01/2015 she was admitted with nausea, vomiting, and increasing dyspnea.  Troponins were positive.  She underwent cardiac catheterization which revealed single-vessel CAD with 30-40% proximal left circumflex stenosis followed by a 99% proximal to mid stenosis prior to a sharp bend in the vessel.  She had elevation of left ventricular end-diastolic pressure 36.  She underwent successful PCI to the 99% circumflex stenosis with insertion of a 2.515 mm Xience Alpine DES stent postdilated to 2.71 mm.  Subsequent echo showed an ejection fraction at 55%.  She was seen in the office by Tenny Craw, PA-C for consideration of preoperative clearance prior to exploration and decompression for lesion nerve in her right wrist.  Since she was on dual antiplatelet therapy.  She was advised to defer having her surgery for a minimum of 6 months following her acute coronary syndrome, and ideally up to a year. planned.  Review her medical records indicates that she was seen in the emergency room in January 2017 with back pain.  A CT scan revealed a cyst arising from the lower right kidney with an nternal fluid level and trace adjacent soft tissue stranding which was concern for possible intracystic hemorrhage.  Renal atrophy consistent with chronic renal disease and had numerous cysts.  There was advanced atherosclerosis.  There was left lower lobe pulmonary nodules.   Since I last saw her, she underwent revision of her left thigh graft for aneurysmal degenerated segment on 10/01/2015.  She denies any recurrent episodes of chest pain or  significant shortness of breath.  She undergoes dialysis on Monday, Wednesday and Fridays.  She has seen Dr. Fredna Dow, but is not yet scheduled for carpal tunnel surgery of her right hand.  She presents for reevaluation.  Past Medical History  Diagnosis Date  . GERD (gastroesophageal reflux disease)   . History of gangrene     left foot  . Hx MRSA infection   . S/P bilateral above knee amputation ([name])   . Coronary artery disease 01/2015    single vessel disease CFX, stented 02/01/2015  . Hypertension     states has been on med. x "a long time"  . Diabetes mellitus     IDDM  . ESRF (end stage renal failure) (HCC)     dialysis M,W, F; left thigh AV graft  . High cholesterol   . 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 Impaired mobility     bilateral AKA, is in a wheelchair  . Myocardial infarction [facility])     " slight heart attack on 01/31/15    Past Surgical History  Procedure Laterality Date  . Av fistula placement  10/18/2008    right thigh AV graft  . Finger debridement  06/20/2010    right middle finger  . Finger amputation  05/27/2010    right middle  . Thrombectomy / arteriovenous graft revision  01/14/2010    thrombectomy left superficial femoral artery; left thigh AV graft placement  . 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 Leg amputation below knee  10/11/2009    right  . Central venous catheter insertion  08/06/2009    removal right IJ Diatek cath., placement left IJ Diatek cath.  . Above knee leg amputation  05/21/2009    left  . Foot amputation  04/04/2009    left transtibial amputation  . Excision / curettage bone cyst talus / calcaneus  03/04/2009    partial calcaneal exc. left; placement wound VAC  . Groin debridement  11/16/2008    right rectus femoris muscle flap to right groin wound; right groing debridement  . Femoral endarterectomy  11/06/2008    right common femoral; embolectomy right femoral  artery; removal right femoral AV graft  . Central venous catheter insertion  07/10/2008; 12/31/2006; 01/19/2006; 06/19/2005; 09/19/2004; 09/08/2004    right IJ Diatek catheter  . Av fistula repair  07/08/2008    removal left upper arm AV graft  . Thrombectomy / arteriovenous graft revision  05/11/2007;12/31/2006; 01/18/2006; 12/16/2005    left upper arm  . Dialysis fistula creation  01/20/2007    left upper arm AV graft  . Dialysis fistula creation  01/28/2006    right upper arm AV graft  . Thrombectomy / arteriovenous graft revision  10/26/2005    left forearm AV graft  . Dialysis fistula creation  10/04/2005    left forearm AV graft  . Av fistula repair  07/14/2005    removal infected right forearm AV graft  . Thrombectomy / arteriovenous graft revision  03/11/2005; 10/20/2004    right forearm AV graft  . Dialysis fistula creation  09/10/2004    right forearm AV graft  . Av fistula placement  07/18/2004    creation left AV fistula, radial to cephalic  . Finger exploration  07/13/2002    and repair left middle finger  . Carpal tunnel release  01/26/2012    Procedure: CARPAL TUNNEL RELEASE;  Surgeon: Wynonia Sours, MD;  Location: [place];  Service: Orthopedics;  Laterality: Left;  . Esophagogastroduodenoscopy  06/26/2012    Procedure: ESOPHAGOGASTRODUODENOSCOPY (EGD);  Surgeon: Juanita Craver, MD;  Location: [facility] ENDOSCOPY;  Service: Endoscopy;  Laterality: N/A;  . Shuntogram N/A 11/12/2011    Procedure: Earney Mallet;  Surgeon: Conrad Zinc, MD;  Location: [facility] CATH LAB;  Service: Cardiovascular;  Laterality: N/A;  . Cardiac catheterization N/A 02/01/2015    Procedure: Left Heart Cath and Coronary Angiography;  Surgeon: Troy Sine, MD;  Location: [place] CV LAB;  Service: Cardiovascular;  Laterality: N/A;  . Cardiac catheterization N/A 02/01/2015    Procedure: Coronary Stent Intervention;  Surgeon: Troy Sine, MD;  Location: [place] CV LAB;  Service: Cardiovascular;  Laterality: N/A;    . Thrombectomy and revision of arterioventous (av) goretex  graft Left 04/24/2012    thigh; replacement of venous limb  . Revision of arteriovenous goretex graft Left 10/01/2015    Procedure: REVISION OF ARTERIOVENOUS GORETEX GRAFT-LEFT THIGH;  Surgeon: Elam Dutch, MD;  Location: [place];  Service: Vascular;  Laterality: Left;    Allergies  Allergen Reactions  . Percocet [Oxycodone-Acetaminophen] Other (See Comments)    GI UPSET  . Flagyl [Metronidazole] Other (See Comments)  . Soap Itching    IVORY SOAP  . Tramadol Itching    Current Outpatient Prescriptions  Medication Sig Dispense Refill  . amLODipine (NORVASC) 10 MG tablet Take 10 mg by mouth at bedtime.    Marland Kitchen aspirin 81 MG chewable tablet Chew  81 mg by mouth daily.    Marland Kitchen atorvastatin (LIPITOR) 40 MG tablet Take 1 tablet (40 mg total) by mouth daily at 6 PM. 30 tablet 0  . cetirizine (ZYRTEC) 10 MG tablet Take 10 mg by mouth daily as needed.  11  . cinacalcet (SENSIPAR) 90 MG tablet Take 90 mg by mouth 3 (three) times daily with meals.     Marland Kitchen HYDROcodone-acetaminophen (NORCO/VICODIN) 5-325 MG tablet Take 2 tablets by mouth every 4 (four) hours as needed. 20 tablet 0  . insulin aspart protamine-insulin aspart (NOVOLOG 70/30) (70-30) 100 UNIT/ML injection Inject 6-10 Units into the skin 2 (two) times daily with a meal. Take 10 units in the morning and 6 units in the evening    . LYRICA 25 MG capsule Take by mouth 2 (two) times daily.  5  . metoCLOPramide (REGLAN) 5 MG tablet Take 5 mg by mouth 3 (three) times daily before meals.    . metoprolol (LOPRESSOR) 50 MG tablet Take 1 tablet (50 mg total) by mouth 2 (two) times daily. 60 tablet 0  . sevelamer (RENVELA) 800 MG tablet Take 3,200 mg by mouth 3 (three) times daily with meals.     . ticagrelor (BRILINTA) 90 MG TABS tablet Take 1 tablet (90 mg total) by mouth 2 (two) times daily. 60 tablet 0  . triamcinolone cream (KENALOG) 0.1 %      No current facility-administered medications  for this visit.    Social History   Social History  . Marital Status: Single    Spouse Name: N/A  . Number of Children: N/A  . Years of Education: N/A   Occupational History  . Not on file.   Social History Main Topics  . Smoking status: Never Smoker   . Smokeless tobacco: Never Used  . Alcohol Use: No  . Drug Use: No  . Sexual Activity: Not on file   Other Topics Concern  . Not on file   Social History Narrative    Family History  Problem Relation Age of Onset  . Diabetes Mother   . Heart disease Mother   . Hypertension Mother   . Heart attack Mother 66  . Diabetes Sister   . Hypertension Brother   . Heart attack Brother     ROS General: Negative; No fevers, chills, or night sweats HEENT: Negative; No changes in vision or hearing, sinus congestion, difficulty swallowing Pulmonary: Negative; No cough, wheezing, shortness of breath, hemoptysis Cardiovascular: See HPI:  GI: Negative; No nausea, vomiting, diarrhea, or abdominal pain GU: Negative; No dysuria, hematuria, or difficulty voiding Musculoskeletal: Bilateral above-the-knee amputations; recent back pain; right hand carpal tunnel syndrome Renal: End-stage renal disease on hemodialysis Hematologic: Negative; no easy bruising, bleeding Endocrine: Negative; no heat/cold intolerance; no diabetes, Neuro: Negative; no changes in balance, headaches Skin: Negative; No rashes or skin lesions Psychiatric: Negative; No behavioral problems, depression Sleep: Negative; No snoring,  daytime sleepiness, hypersomnolence, bruxism, restless legs, hypnogognic hallucinations. Other comprehensive 14 point system review is negative   Physical Exam BP 129/77 mmHg  Pulse 71 Wt Readings from Last 3 Encounters:  10/31/15 152 lb (68.947 kg)  10/17/15 152 lb (68.947 kg)  10/01/15 156 lb (70.761 kg)   General: Alert, oriented, no distress.  Skin: normal turgor, no rashes, warm and dry HEENT: Normocephalic, atraumatic. Pupils  equal round and reactive to light; sclera anicteric; extraocular muscles intact, No lid lag; Nose without nasal septal hypertrophy; Mouth/Parynx benign; Mallinpatti scale 3 Neck: No JVD, no carotid bruits;  normal carotid upstroke Lungs: clear to ausculatation and percussion bilaterally; no wheezing or rales, normal inspiratory and expiratory effort Chest wall: without tenderness to palpitation Heart: PMI not displaced, RRR, s1 s2 normal, 1/6 systolic murmur, No diastolic murmur, no rubs, gallops, thrills, or heaves Abdomen: soft, nontender; no hepatosplenomehaly, BS+; abdominal aorta nontender and not dilated by palpation. Back: minimal tenderness in the right flank Pulses: 2+  Extremities: Bilateral above-the-knee amputations Neurologic: grossly nonfocal; Cranial nerves grossly wnl Psychologic: Normal mood and affect  ECG (independently read by me): Normal sinus at 71 bpm.  T changes.  QTc interval 458/QTC 497  January 2017 ECG (independently read by me): Normal sinus rhythm at 69 bpm.  T-wave inversion in leads 1 and aVL.  Increased QTc interval 514 ms.  LABS:  BMP Latest Ref Rng 10/01/2015 09/18/2015 09/08/2015  Glucose 65 - 99 mg/dL 80 79 146(H)  BUN 6 - 20 mg/dL - 15 34(H)  Creatinine 0.44 - 1.00 mg/dL - 7.10(H) 7.67(H)  Sodium 135 - 145 mmol/L 139 139 138  Potassium 3.5 - 5.1 mmol/L 3.3(L) 3.5 3.6  Chloride 101 - 111 mmol/L - 96(L) 95(L)  CO2 22 - 32 mmol/L - 28 29  Calcium 8.9 - 10.3 mg/dL - 8.9 8.8(L)     Hepatic Function Latest Ref Rng 09/08/2015 02/04/2015 02/01/2015  Total Protein 6.5 - 8.1 g/dL 7.3 8.1 8.2(H)  Albumin 3.5 - 5.0 g/dL 3.2(L) 3.5 3.5  AST 15 - 41 U/L _0 ALT 14 - 54 U/L 9(L) 9(L) 9(L)  Alk Phosphatase 38 - 126 U/L 101 66 65  Total Bilirubin 0.3 - 1.2 mg/dL 0.7 1.2 0.5    CBC Latest Ref Rng 10/01/2015 09/18/2015 09/08/2015  WBC 4.0 - 10.5 K/uL - 8.9 8.3  Hemoglobin 12.0 - 15.0 g/dL 11.9(L) 9.3(L) 8.8(L)  Hematocrit 36.0 - 46.0 % 35.0(L) 29.2(L) 26.5(L)    Platelets 150 - 400 K/uL - 460(H) 221   Lab Results  Component Value Date   MCV 86.4 09/18/2015   MCV 84.9 09/08/2015   MCV 84.0 08/14/2015    Lab Results  Component Value Date   TSH 2.358 02/01/2015    BNP No results found for: BNP  ProBNP No results found for: PROBNP   Lipid Panel     Component Value Date/Time   CHOL 121* 09/10/2015 0902   TRIG 118 09/10/2015 0902   HDL 34* 09/10/2015 0902   CHOLHDL 3.6 09/10/2015 0902   VLDL 24 09/10/2015 0902   LDLCALC 63 09/10/2015 0902     RADIOLOGY: No results found.    ASSESSMENT AND PLAN: Ms. Nyaisha Simao is a 45 year old African-American female who has a long-standing history of insulin dependent diabetes mellitus and end-stage renal disease.  She suffered a non-ST segment elevation MI in June 2016 and was found to have subtotal stenosis of her left circumflex coronary artery which was successfully stented with a DES stent.  She had issues with her right wrist and was diagnosed with carpal tunnel syndrome.  When I last saw her wrist discomfort had improved.  Her blood pressure today is stable on metoprolol 50 mg twice a day.  Amlodipine 10 mg daily.  She is on atorvastatin 40 mg for hyperlipidemia with target LDL less than 70.  Per ACS guidelines she will have been on dual MI platelet therapy for a year, next week.  She is still in need to undergo elective carpal tunnel surgery.  In June I am scheduling her for [facility] study to  assess scar/ischemia, which will be used for preoperative clearance.  If this is low risk and does not demonstrate significant ischemia she will need to hold Brilinta for 5 days prior to elective carpal tunnel surgery. She is on lipid lowering therapy with atorvastatin 40 mg and denies myalgias.  A renal standpoint. she is followed by Dr. Jimmy Footman  regarding for renal disease and renal cysts.  I will contact her regarding her nuclear stress test.  I will see her in 6 months for cardiology  reevaluation.  Time spent 25 minutes.  Troy Sine, MD, [facility]  01/23/2016 2:24 PM

## 2016-01-30 ENCOUNTER — Inpatient Hospital Stay (HOSPITAL_COMMUNITY): Admission: RE | Admit: 2016-01-30 | Payer: Medicare Other | Source: Ambulatory Visit

## 2016-02-05 NOTE — Addendum Note (Signed)
Addended by: Evans LanceSTOVER, Satoshi Kalas W on: 02/05/2016 08:10 AM   Modules accepted: Orders

## 2016-02-06 ENCOUNTER — Other Ambulatory Visit: Payer: Self-pay | Admitting: Cardiovascular Disease

## 2016-02-06 ENCOUNTER — Telehealth: Payer: Self-pay | Admitting: *Deleted

## 2016-02-06 ENCOUNTER — Other Ambulatory Visit: Payer: Self-pay | Admitting: *Deleted

## 2016-02-06 DIAGNOSIS — Z9861 Coronary angioplasty status: Secondary | ICD-10-CM

## 2016-02-06 DIAGNOSIS — I2510 Atherosclerotic heart disease of native coronary artery without angina pectoris: Secondary | ICD-10-CM

## 2016-02-06 DIAGNOSIS — I10 Essential (primary) hypertension: Secondary | ICD-10-CM

## 2016-02-06 NOTE — Telephone Encounter (Signed)
Spoke with patient regarding Lexiscan Myoview--[facility] 02/13/16 at 9:00 am---arrive at 8:45 am--follow present  Instructions.  Patient voiced her understanding.

## 2016-02-11 ENCOUNTER — Encounter (HOSPITAL_COMMUNITY): Payer: Medicare Other

## 2016-02-13 ENCOUNTER — Encounter (HOSPITAL_COMMUNITY)
Admission: RE | Admit: 2016-02-13 | Discharge: 2016-02-13 | Disposition: A | Discharge status: Home / Self Care | Payer: Medicare Other | Source: Ambulatory Visit | Attending: Cardiovascular Disease | Admitting: Cardiovascular Disease

## 2016-02-13 ENCOUNTER — Encounter (HOSPITAL_BASED_OUTPATIENT_CLINIC_OR_DEPARTMENT_OTHER)
Admission: RE | Admit: 2016-02-13 | Discharge: 2016-02-13 | Disposition: A | Discharge status: Home / Self Care | Payer: Medicare Other | Source: Ambulatory Visit | Attending: Cardiovascular Disease | Admitting: Cardiovascular Disease

## 2016-02-13 DIAGNOSIS — I2510 Atherosclerotic heart disease of native coronary artery without angina pectoris: Secondary | ICD-10-CM

## 2016-02-13 DIAGNOSIS — I10 Essential (primary) hypertension: Secondary | ICD-10-CM

## 2016-02-13 DIAGNOSIS — Z9861 Coronary angioplasty status: Secondary | ICD-10-CM | POA: Insufficient documentation

## 2016-02-13 LAB — NM MYOCAR MULTI W/SPECT W/WALL MOTION / EF
CHL CUP NUCLEAR SSS: 14
CHL CUP STRESS STAGE 1 DBP: 92 mmHg
CHL CUP STRESS STAGE 1 GRADE: 0 %
CHL CUP STRESS STAGE 1 SPEED: 0 mph
CHL CUP STRESS STAGE 2 GRADE: 0 %
CHL CUP STRESS STAGE 2 HR: 67 {beats}/min
CHL CUP STRESS STAGE 2 SPEED: 0 mph
CHL CUP STRESS STAGE 3 DBP: 74 mmHg
CHL CUP STRESS STAGE 3 HR: 78 {beats}/min
CHL CUP STRESS STAGE 4 DBP: 74 mmHg
CHL CUP STRESS STAGE 4 SBP: 129 mmHg
CSEPPBP: 129 mmHg
CSEPPHR: 78 {beats}/min
CSEPPMHR: 44 %
Estimated workload: 1 METS
Exercise duration (min): 5 min
Exercise duration (sec): 16 s
LHR: 0
LVDIAVOL: 120 mL (ref 46–106)
LVSYSVOL: 68 mL
NUC STRESS TID: 1.06
Rest HR: 69 {beats}/min
SDS: 2
SRS: 12
Stage 1 HR: 67 {beats}/min
Stage 1 SBP: 153 mmHg
Stage 3 Grade: 0 %
Stage 3 SBP: 137 mmHg
Stage 3 Speed: 0 mph
Stage 4 Grade: 0 %
Stage 4 HR: 78 {beats}/min
Stage 4 Speed: 0 mph

## 2016-02-13 MED ORDER — TECHNETIUM TC 99M TETROFOSMIN IV KIT
10.0000 | PACK | Freq: Once | INTRAVENOUS | Status: AC | PRN
  Administered 2016-02-13: 10 via INTRAVENOUS

## 2016-02-13 MED ORDER — TECHNETIUM TC 99M TETROFOSMIN IV KIT
30.0000 | PACK | Freq: Once | INTRAVENOUS | Status: AC | PRN
  Administered 2016-02-13: 30 via INTRAVENOUS

## 2016-02-13 MED ORDER — REGADENOSON 0.4 MG/5ML IV SOLN
INTRAVENOUS | Status: AC
  Filled 2016-02-13: qty 5

## 2016-02-13 MED ORDER — REGADENOSON 0.4 MG/5ML IV SOLN
0.4000 mg | Freq: Once | INTRAVENOUS | Status: AC
  Administered 2016-02-13: 0.4 mg via INTRAVENOUS

## 2016-02-17 ENCOUNTER — Encounter: Payer: Self-pay | Admitting: *Deleted

## 2016-03-09 ENCOUNTER — Other Ambulatory Visit: Payer: Self-pay

## 2016-03-09 DIAGNOSIS — Z1231 Encounter for screening mammogram for malignant neoplasm of breast: Secondary | ICD-10-CM

## 2016-03-26 ENCOUNTER — Other Ambulatory Visit: Payer: Self-pay | Admitting: Family Medicine

## 2016-03-26 DIAGNOSIS — N6320 Unspecified lump in the left breast, unspecified quadrant: Secondary | ICD-10-CM

## 2016-04-02 ENCOUNTER — Other Ambulatory Visit: Payer: Self-pay | Admitting: Family Medicine

## 2016-04-02 ENCOUNTER — Ambulatory Visit
Admission: RE | Admit: 2016-04-02 | Discharge: 2016-04-02 | Disposition: A | Discharge status: Home / Self Care | Payer: Medicare Other | Source: Ambulatory Visit | Attending: Family Medicine | Admitting: Family Medicine

## 2016-04-02 DIAGNOSIS — N6320 Unspecified lump in the left breast, unspecified quadrant: Secondary | ICD-10-CM

## 2016-04-02 DIAGNOSIS — N6310 Unspecified lump in the right breast, unspecified quadrant: Secondary | ICD-10-CM

## 2016-04-08 ENCOUNTER — Other Ambulatory Visit: Payer: Self-pay | Admitting: Family Medicine

## 2016-04-08 DIAGNOSIS — N6310 Unspecified lump in the right breast, unspecified quadrant: Secondary | ICD-10-CM

## 2016-04-09 ENCOUNTER — Ambulatory Visit
Admission: RE | Admit: 2016-04-09 | Discharge: 2016-04-09 | Disposition: A | Discharge status: Home / Self Care | Payer: Medicare Other | Source: Ambulatory Visit | Attending: Family Medicine | Admitting: Family Medicine

## 2016-04-09 DIAGNOSIS — N6310 Unspecified lump in the right breast, unspecified quadrant: Secondary | ICD-10-CM

## 2016-05-12 ENCOUNTER — Emergency Department (HOSPITAL_COMMUNITY)
Admission: EM | Admit: 2016-05-12 | Discharge: 2016-05-12 | Disposition: A | Discharge status: Home / Self Care | Payer: Medicare Other | Source: Home / Self Care | Attending: Emergency Medicine | Admitting: Emergency Medicine

## 2016-05-12 ENCOUNTER — Encounter (HOSPITAL_COMMUNITY): Payer: Self-pay | Admitting: Emergency Medicine

## 2016-05-12 DIAGNOSIS — N186 End stage renal disease: Secondary | ICD-10-CM

## 2016-05-12 DIAGNOSIS — I2510 Atherosclerotic heart disease of native coronary artery without angina pectoris: Secondary | ICD-10-CM

## 2016-05-12 DIAGNOSIS — I252 Old myocardial infarction: Secondary | ICD-10-CM

## 2016-05-12 DIAGNOSIS — M545 Low back pain: Secondary | ICD-10-CM

## 2016-05-12 DIAGNOSIS — E119 Type 2 diabetes mellitus without complications: Secondary | ICD-10-CM | POA: Insufficient documentation

## 2016-05-12 DIAGNOSIS — Z794 Long term (current) use of insulin: Secondary | ICD-10-CM

## 2016-05-12 DIAGNOSIS — Z79899 Other long term (current) drug therapy: Secondary | ICD-10-CM

## 2016-05-12 DIAGNOSIS — I120 Hypertensive chronic kidney disease with stage 5 chronic kidney disease or end stage renal disease: Secondary | ICD-10-CM | POA: Insufficient documentation

## 2016-05-12 DIAGNOSIS — Z7982 Long term (current) use of aspirin: Secondary | ICD-10-CM | POA: Insufficient documentation

## 2016-05-12 LAB — CBC
HCT: 36.1 % (ref 36.0–46.0)
HEMOGLOBIN: 11.8 g/dL — AB (ref 12.0–15.0)
MCH: 28 pg (ref 26.0–34.0)
MCHC: 32.7 g/dL (ref 30.0–36.0)
MCV: 85.7 fL (ref 78.0–100.0)
PLATELETS: 270 10*3/uL (ref 150–400)
RBC: 4.21 MIL/uL (ref 3.87–5.11)
RDW: 17.2 % — ABNORMAL HIGH (ref 11.5–15.5)
WBC: 9.1 10*3/uL (ref 4.0–10.5)

## 2016-05-12 LAB — COMPREHENSIVE METABOLIC PANEL
ALK PHOS: 128 U/L — AB (ref 38–126)
ALT: 12 U/L — AB (ref 14–54)
ANION GAP: 14 (ref 5–15)
AST: 25 U/L (ref 15–41)
Albumin: 3.8 g/dL (ref 3.5–5.0)
BILIRUBIN TOTAL: 0.5 mg/dL (ref 0.3–1.2)
BUN: 24 mg/dL — ABNORMAL HIGH (ref 6–20)
CALCIUM: 10.3 mg/dL (ref 8.9–10.3)
CO2: 29 mmol/L (ref 22–32)
CREATININE: 6.85 mg/dL — AB (ref 0.44–1.00)
Chloride: 90 mmol/L — ABNORMAL LOW (ref 101–111)
GFR, EST AFRICAN AMERICAN: 8 mL/min — AB (ref >60)
GFR, EST NON AFRICAN AMERICAN: 6 mL/min — AB (ref >60)
Glucose, Bld: 134 mg/dL — ABNORMAL HIGH (ref 65–99)
Potassium: 3.7 mmol/L (ref 3.5–5.1)
SODIUM: 133 mmol/L — AB (ref 135–145)
TOTAL PROTEIN: 8.1 g/dL (ref 6.5–8.1)

## 2016-05-12 LAB — LIPASE, BLOOD: Lipase: 29 U/L (ref 11–51)

## 2016-05-12 MED ORDER — HYDROCODONE-ACETAMINOPHEN 5-325 MG PO TABS
1.0000 | ORAL_TABLET | Freq: Once | ORAL | Status: AC
  Administered 2016-05-12: 1 via ORAL
  Filled 2016-05-12: qty 1

## 2016-05-12 MED ORDER — METHOCARBAMOL 500 MG PO TABS
500.0000 mg | ORAL_TABLET | Freq: Two times a day (BID) | ORAL | 0 refills | Status: DC
Start: 2016-05-12 — End: 2016-05-25

## 2016-05-12 MED ORDER — HYDROCODONE-ACETAMINOPHEN 5-325 MG PO TABS: 2.0000 | ORAL_TABLET | ORAL | 0 refills | Status: DC | PRN

## 2016-05-12 NOTE — Discharge Instructions (Signed)
Return to the ED with any concerns including fever, numbness of legs, not able to move legs, vomiting, decreased level of alertness/lethargy, or any other alarming symptoms

## 2016-05-12 NOTE — ED Triage Notes (Addendum)
Pt arrives with c/o intermittent abdominal pain and "back problems" described as thoracic back pain. States pain 10/10, but when asked to describe she says "its really the weakness" that she's concerned about. OTC tylenol not effective for pain. No N/V/D, no fevers at home. Hx dialysis, HTN, bilateral leg amputee.   Pt no longer makes urine.

## 2016-05-14 ENCOUNTER — Inpatient Hospital Stay (HOSPITAL_COMMUNITY)
Admission: EM | Admit: 2016-05-14 | Discharge: 2016-05-16 | DRG: 682 | Disposition: A | Discharge status: Home / Self Care | Payer: Medicare Other | Attending: Nephrology | Admitting: Nephrology

## 2016-05-14 ENCOUNTER — Encounter (HOSPITAL_COMMUNITY): Payer: Self-pay | Admitting: *Deleted

## 2016-05-14 ENCOUNTER — Emergency Department (HOSPITAL_COMMUNITY): Payer: Medicare Other

## 2016-05-14 DIAGNOSIS — Z89021 Acquired absence of right finger(s): Secondary | ICD-10-CM

## 2016-05-14 DIAGNOSIS — N189 Chronic kidney disease, unspecified: Secondary | ICD-10-CM

## 2016-05-14 DIAGNOSIS — Z89612 Acquired absence of left leg above knee: Secondary | ICD-10-CM

## 2016-05-14 DIAGNOSIS — R9431 Abnormal electrocardiogram [ECG] [EKG]: Secondary | ICD-10-CM | POA: Diagnosis present

## 2016-05-14 DIAGNOSIS — E1151 Type 2 diabetes mellitus with diabetic peripheral angiopathy without gangrene: Secondary | ICD-10-CM | POA: Diagnosis present

## 2016-05-14 DIAGNOSIS — I252 Old myocardial infarction: Secondary | ICD-10-CM

## 2016-05-14 DIAGNOSIS — I2510 Atherosclerotic heart disease of native coronary artery without angina pectoris: Secondary | ICD-10-CM | POA: Diagnosis present

## 2016-05-14 DIAGNOSIS — E860 Dehydration: Secondary | ICD-10-CM | POA: Diagnosis not present

## 2016-05-14 DIAGNOSIS — Z7982 Long term (current) use of aspirin: Secondary | ICD-10-CM

## 2016-05-14 DIAGNOSIS — E7800 Pure hypercholesterolemia, unspecified: Secondary | ICD-10-CM | POA: Diagnosis present

## 2016-05-14 DIAGNOSIS — E118 Type 2 diabetes mellitus with unspecified complications: Secondary | ICD-10-CM

## 2016-05-14 DIAGNOSIS — Z8614 Personal history of Methicillin resistant Staphylococcus aureus infection: Secondary | ICD-10-CM

## 2016-05-14 DIAGNOSIS — IMO0001 Reserved for inherently not codable concepts without codable children: Secondary | ICD-10-CM

## 2016-05-14 DIAGNOSIS — Z885 Allergy status to narcotic agent status: Secondary | ICD-10-CM

## 2016-05-14 DIAGNOSIS — Z794 Long term (current) use of insulin: Secondary | ICD-10-CM

## 2016-05-14 DIAGNOSIS — I10 Essential (primary) hypertension: Secondary | ICD-10-CM | POA: Diagnosis present

## 2016-05-14 DIAGNOSIS — E1122 Type 2 diabetes mellitus with diabetic chronic kidney disease: Secondary | ICD-10-CM | POA: Diagnosis present

## 2016-05-14 DIAGNOSIS — N2581 Secondary hyperparathyroidism of renal origin: Secondary | ICD-10-CM | POA: Diagnosis present

## 2016-05-14 DIAGNOSIS — I1311 Hypertensive heart and chronic kidney disease without heart failure, with stage 5 chronic kidney disease, or end stage renal disease: Principal | ICD-10-CM | POA: Diagnosis present

## 2016-05-14 DIAGNOSIS — Z89611 Acquired absence of right leg above knee: Secondary | ICD-10-CM

## 2016-05-14 DIAGNOSIS — Z888 Allergy status to other drugs, medicaments and biological substances status: Secondary | ICD-10-CM

## 2016-05-14 DIAGNOSIS — D649 Anemia, unspecified: Secondary | ICD-10-CM | POA: Diagnosis present

## 2016-05-14 DIAGNOSIS — K219 Gastro-esophageal reflux disease without esophagitis: Secondary | ICD-10-CM | POA: Diagnosis present

## 2016-05-14 DIAGNOSIS — Z833 Family history of diabetes mellitus: Secondary | ICD-10-CM

## 2016-05-14 DIAGNOSIS — N186 End stage renal disease: Secondary | ICD-10-CM | POA: Diagnosis not present

## 2016-05-14 DIAGNOSIS — I9589 Other hypotension: Secondary | ICD-10-CM | POA: Diagnosis present

## 2016-05-14 DIAGNOSIS — I959 Hypotension, unspecified: Secondary | ICD-10-CM | POA: Diagnosis present

## 2016-05-14 DIAGNOSIS — M545 Low back pain: Secondary | ICD-10-CM | POA: Diagnosis present

## 2016-05-14 DIAGNOSIS — R739 Hyperglycemia, unspecified: Secondary | ICD-10-CM | POA: Diagnosis not present

## 2016-05-14 DIAGNOSIS — Z8249 Family history of ischemic heart disease and other diseases of the circulatory system: Secondary | ICD-10-CM

## 2016-05-14 DIAGNOSIS — R112 Nausea with vomiting, unspecified: Secondary | ICD-10-CM

## 2016-05-14 DIAGNOSIS — Z9861 Coronary angioplasty status: Secondary | ICD-10-CM

## 2016-05-14 DIAGNOSIS — I4581 Long QT syndrome: Secondary | ICD-10-CM | POA: Diagnosis not present

## 2016-05-14 DIAGNOSIS — Z23 Encounter for immunization: Secondary | ICD-10-CM

## 2016-05-14 DIAGNOSIS — N179 Acute kidney failure, unspecified: Secondary | ICD-10-CM | POA: Diagnosis present

## 2016-05-14 DIAGNOSIS — L299 Pruritus, unspecified: Secondary | ICD-10-CM | POA: Diagnosis not present

## 2016-05-14 DIAGNOSIS — Z79899 Other long term (current) drug therapy: Secondary | ICD-10-CM

## 2016-05-14 DIAGNOSIS — E1165 Type 2 diabetes mellitus with hyperglycemia: Secondary | ICD-10-CM | POA: Diagnosis present

## 2016-05-14 DIAGNOSIS — Z992 Dependence on renal dialysis: Secondary | ICD-10-CM

## 2016-05-14 DIAGNOSIS — Z91048 Other nonmedicinal substance allergy status: Secondary | ICD-10-CM

## 2016-05-14 DIAGNOSIS — Z7902 Long term (current) use of antithrombotics/antiplatelets: Secondary | ICD-10-CM

## 2016-05-14 LAB — I-STAT VENOUS BLOOD GAS, ED
ACID-BASE EXCESS: 2 mmol/L (ref 0.0–2.0)
BICARBONATE: 25.8 mmol/L (ref 20.0–28.0)
O2 Saturation: 82 %
PCO2 VEN: 37.5 mmHg — AB (ref 44.0–60.0)
PH VEN: 7.446 — AB (ref 7.250–7.430)
PO2 VEN: 44 mmHg (ref 32.0–45.0)
TCO2: 27 mmol/L (ref 0–100)

## 2016-05-14 LAB — CBG MONITORING, ED
Glucose-Capillary: 249 mg/dL — ABNORMAL HIGH (ref 65–99)
Glucose-Capillary: 282 mg/dL — ABNORMAL HIGH (ref 65–99)

## 2016-05-14 LAB — BASIC METABOLIC PANEL
ANION GAP: 23 — AB (ref 5–15)
BUN: 26 mg/dL — AB (ref 6–20)
CHLORIDE: 90 mmol/L — AB (ref 101–111)
CO2: 21 mmol/L — ABNORMAL LOW (ref 22–32)
Calcium: 8.6 mg/dL — ABNORMAL LOW (ref 8.9–10.3)
Creatinine, Ser: 5.8 mg/dL — ABNORMAL HIGH (ref 0.44–1.00)
GFR calc Af Amer: 9 mL/min — ABNORMAL LOW (ref >60)
GFR calc non Af Amer: 8 mL/min — ABNORMAL LOW (ref >60)
Glucose, Bld: 289 mg/dL — ABNORMAL HIGH (ref 65–99)
POTASSIUM: 4 mmol/L (ref 3.5–5.1)
SODIUM: 134 mmol/L — AB (ref 135–145)

## 2016-05-14 LAB — CBC
HEMATOCRIT: 36.4 % (ref 36.0–46.0)
HEMOGLOBIN: 12 g/dL (ref 12.0–15.0)
MCH: 28.6 pg (ref 26.0–34.0)
MCHC: 33 g/dL (ref 30.0–36.0)
MCV: 86.7 fL (ref 78.0–100.0)
Platelets: 193 10*3/uL (ref 150–400)
RBC: 4.2 MIL/uL (ref 3.87–5.11)
RDW: 18.2 % — AB (ref 11.5–15.5)
WBC: 12.8 10*3/uL — AB (ref 4.0–10.5)

## 2016-05-14 LAB — I-STAT CG4 LACTIC ACID, ED: LACTIC ACID, VENOUS: 3.85 mmol/L — AB (ref 0.5–1.9)

## 2016-05-14 MED ORDER — ONDANSETRON 4 MG PO TBDP
ORAL_TABLET | ORAL | Status: AC
  Filled 2016-05-14: qty 1

## 2016-05-14 MED ORDER — SODIUM CHLORIDE 0.9 % IV BOLUS (SEPSIS)
250.0000 mL | Freq: Once | INTRAVENOUS | Status: AC
  Administered 2016-05-14: 250 mL via INTRAVENOUS

## 2016-05-14 MED ORDER — DIPHENHYDRAMINE HCL 50 MG/ML IJ SOLN
25.0000 mg | Freq: Once | INTRAMUSCULAR | Status: AC
  Administered 2016-05-14: 25 mg via INTRAVENOUS
  Filled 2016-05-14: qty 1

## 2016-05-14 MED ORDER — ONDANSETRON 4 MG PO TBDP
4.0000 mg | ORAL_TABLET | Freq: Once | ORAL | Status: AC
  Administered 2016-05-14: 4 mg via ORAL

## 2016-05-14 MED ORDER — ONDANSETRON 4 MG PO TBDP: 8.0000 mg | ORAL_TABLET | Freq: Once | ORAL | Status: DC

## 2016-05-14 MED ORDER — DIPHENHYDRAMINE HCL 25 MG PO CAPS
25.0000 mg | ORAL_CAPSULE | Freq: Once | ORAL | Status: AC
  Administered 2016-05-14: 25 mg via ORAL
  Filled 2016-05-14: qty 1

## 2016-05-14 MED ORDER — SODIUM CHLORIDE 0.9 % IV BOLUS (SEPSIS)
500.0000 mL | Freq: Once | INTRAVENOUS | Status: AC
  Administered 2016-05-14: 500 mL via INTRAVENOUS

## 2016-05-14 NOTE — ED Triage Notes (Addendum)
Pt with multiple complaints.  States she was seen here on 9/12 for back pain but was unable to pick up the medications.  Now she also feels weak, she's been vomiting, and her entire body continues to itch, despite taking benadryl.  Pt skin cool and clammy.  Last dialysis was Wed.

## 2016-05-14 NOTE — ED Notes (Signed)
Pt given crackers and diet sprite

## 2016-05-14 NOTE — ED Provider Notes (Signed)
[facility]-EMERGENCY DEPT Provider Note   CSN: [id number] Arrival date & time: 05/14/16  1649     History   Chief Complaint Chief Complaint  Patient presents with  . Weakness  . Back Pain    HPI [name] is a 45 y.o. female.  The history is provided by the patient.  Weakness  This is a new problem. The current episode started more than 2 days ago. The problem has not changed since onset.There has been no fever. Associated symptoms include vomiting. Pertinent negatives include no chest pain. Associated symptoms comments: Mild cough and SOB .  Back Pain   This is a new problem. The current episode started more than 2 days ago. The problem occurs daily. The problem has been gradually improving. The pain is present in the lumbar spine. Associated symptoms include weakness. Pertinent negatives include no chest pain and no fever.  Patient presents with multiple complaints:  Patient with h/o ESRD on dialysis (MWF, no missed sessions) She reports for over 3 days she has had diffuse weakness.  She also reports low back pain (reports back pain actually improving)  Recently seen in the ED for back pain and had meds given but unable to have those filled No fever She reports nausea/vomiting (nonbloody) in past day No diarrhea No cp No abd pain She reports decreased PO over past several days and this is her biggest concern as she fears she might not be able to take her medications  Past Medical History:  Diagnosis Date  . Coronary artery disease 01/2015   single vessel disease CFX, stented 02/01/2015  . Diabetes mellitus    IDDM  . ESRF (end stage renal failure) (HCC)    dialysis M,W, F; left thigh AV graft  . GERD (gastroesophageal reflux disease)   . High cholesterol   . History of gangrene    left foot  . Hx MRSA infection   . Hypertension    states has been on med. x "a long time"  . Impaired mobility    bilateral AKA, is in a wheelchair  . 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 Myocardial infarction (HCC)    " slight heart attack on 01/31/15  . 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 S/P bilateral above knee amputation [facility])     Patient Active Problem List   Diagnosis Date Noted  . Renal cyst 09/11/2015  . Essential hypertension 03/06/2015  . Chest pain 02/05/2015  . Elevated troponin   . CAD S/P CFX DES 02/01/15   . PVD (peripheral vascular disease) (HCC)   . Nausea with vomiting   . Acute pulmonary edema (HCC)   . Hemoptysis   . NSTEMI (non-ST elevated myocardial infarction) (HCC)   . HCAP (healthcare-associated pneumonia) 04/20/2013  . Hyperglycemia 06/23/2012  . MRSA infection 06/07/2012  . 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 Arteriovenous graft infection (HCC) 04/28/2012  . Bacteremia 04/25/2012  . Leucocytosis 04/24/2012  . ESRD on dialysis 04/24/2012  . 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 Anemia 04/24/2012    Past Surgical History:  Procedure Laterality Date  . ABOVE KNEE LEG AMPUTATION  11/08/2009   right  . ABOVE KNEE LEG AMPUTATION  05/21/2009   left  . AV FISTULA PLACEMENT  10/18/2008   right thigh AV graft  . AV FISTULA PLACEMENT  07/18/2004   creation left AV fistula, radial to cephalic  . AV  FISTULA REPAIR  07/08/2008   removal left upper arm AV graft  . AV FISTULA REPAIR  07/14/2005   removal infected right forearm AV graft  . CARDIAC CATHETERIZATION N/A 02/01/2015   Procedure: Left Heart Cath and Coronary Angiography;  Surgeon: Lennette Bihari, MD;  Location: [facility] INVASIVE CV LAB;  Service: Cardiovascular;  Laterality: N/A;  . CARDIAC CATHETERIZATION N/A 02/01/2015   Procedure: Coronary Stent Intervention;  Surgeon: Lennette Bihari, MD;  Location: [facility] INVASIVE CV LAB;  Service: Cardiovascular;  Laterality: N/A;  . CARPAL TUNNEL RELEASE  01/26/2012   Procedure: CARPAL TUNNEL RELEASE;  Surgeon: Nicki Reaper, MD;  Location:  SURGERY CENTER;   Service: Orthopedics;  Laterality: Left;  . CENTRAL VENOUS CATHETER INSERTION  08/06/2009   removal right IJ Diatek cath., placement left IJ Diatek cath.  . CENTRAL VENOUS CATHETER INSERTION  07/10/2008; 12/31/2006; 01/19/2006; 06/19/2005; 09/19/2004; 09/08/2004   right IJ Diatek catheter  . DIALYSIS FISTULA CREATION  01/20/2007   left upper arm AV graft  . DIALYSIS FISTULA CREATION  01/28/2006   right upper arm AV graft  . DIALYSIS FISTULA CREATION  10/04/2005   left forearm AV graft  . DIALYSIS FISTULA CREATION  09/10/2004   right forearm AV graft  . ESOPHAGOGASTRODUODENOSCOPY  06/26/2012   Procedure: ESOPHAGOGASTRODUODENOSCOPY (EGD);  Surgeon: Charna Elizabeth, MD;  Location: [facility] ENDOSCOPY;  Service: Endoscopy;  Laterality: N/A;  . EXCISION / CURETTAGE BONE CYST TALUS / CALCANEUS  03/04/2009   partial calcaneal exc. left; placement wound VAC  . FEMORAL ENDARTERECTOMY  11/06/2008   right common femoral; embolectomy right femoral artery; removal right femoral AV graft  . FINGER AMPUTATION  05/27/2010   right middle  . FINGER DEBRIDEMENT  06/20/2010   right middle finger  . FINGER EXPLORATION  07/13/2002   and repair left middle finger  . FOOT AMPUTATION  04/04/2009   left transtibial amputation  . GROIN DEBRIDEMENT  11/16/2008   right rectus femoris muscle flap to right groin wound; right groing debridement  . LEG AMPUTATION BELOW KNEE  10/11/2009   right  . REVISION OF ARTERIOVENOUS GORETEX GRAFT Left 10/01/2015   Procedure: REVISION OF ARTERIOVENOUS GORETEX GRAFT-LEFT THIGH;  Surgeon: Sherren Kerns, MD;  Location: [facility] OR;  Service: Vascular;  Laterality: Left;  . SHUNTOGRAM N/A 11/12/2011   Procedure: Betsey Amen;  Surgeon: Fransisco Hertz, MD;  Location: [facility] CATH LAB;  Service: Cardiovascular;  Laterality: N/A;  . THROMBECTOMY / ARTERIOVENOUS GRAFT REVISION  01/14/2010   thrombectomy left superficial femoral artery; left thigh AV graft placement  . THROMBECTOMY / ARTERIOVENOUS GRAFT REVISION   05/11/2007;12/31/2006; 01/18/2006; 12/16/2005   left upper arm  . THROMBECTOMY / ARTERIOVENOUS GRAFT REVISION  10/26/2005   left forearm AV graft  . THROMBECTOMY / ARTERIOVENOUS GRAFT REVISION  03/11/2005; 10/20/2004   right forearm AV graft  . THROMBECTOMY AND REVISION OF ARTERIOVENTOUS (AV) GORETEX  GRAFT Left 04/24/2012   thigh; replacement of venous limb    OB History    No data available       Home Medications    Prior to Admission medications   Medication Sig Start Date End Date Taking? Authorizing Provider  aspirin 81 MG chewable tablet Chew 81 mg by mouth daily.   Yes Historical Provider, MD  atorvastatin (LIPITOR) 40 MG tablet Take 1 tablet (40 mg total) by mouth daily at 6 PM. 02/02/15  Yes Calvert Cantor, MD  cetirizine (ZYRTEC) 10 MG tablet Take 10 mg by mouth daily as needed for  allergies.  01/13/16  Yes Historical Provider, MD  cinacalcet (SENSIPAR) 30 MG tablet Take 60 mg by mouth daily.   Yes Historical Provider, MD  insulin aspart protamine-insulin aspart (NOVOLOG 70/30) (70-30) 100 UNIT/ML injection Inject 6-10 Units into the skin 2 (two) times daily with a meal. Take 10 units in the morning and 6 units in the evening   Yes Historical Provider, MD  metoCLOPramide (REGLAN) 5 MG tablet Take 5 mg by mouth 3 (three) times daily before meals.   Yes Historical Provider, MD  metoprolol (LOPRESSOR) 50 MG tablet Take 1 tablet (50 mg total) by mouth 2 (two) times daily. 02/03/15  Yes Calvert Cantor, MD  midodrine (PROAMATINE) 10 MG tablet Take 10 mg by mouth 3 (three) times daily.   Yes Historical Provider, MD  NOVOLIN 70/30 (70-30) 100 UNIT/ML injection Inject 10-12 Units into the skin 2 (two) times daily. 04/07/16  Yes Historical Provider, MD  sevelamer (RENVELA) 800 MG tablet Take 2,400 mg by mouth 3 (three) times daily with meals.    Yes Historical Provider, MD  ticagrelor (BRILINTA) 90 MG TABS tablet Take 1 tablet (90 mg total) by mouth 2 (two) times daily. 02/02/15  Yes Calvert Cantor, MD    HYDROcodone-acetaminophen (NORCO/VICODIN) 5-325 MG tablet Take 2 tablets by mouth every 4 (four) hours as needed. 05/12/16   Jerelyn Scott, MD  methocarbamol (ROBAXIN) 500 MG tablet Take 1 tablet (500 mg total) by mouth 2 (two) times daily. 05/12/16   Jerelyn Scott, MD    Family History Family History  Problem Relation Age of Onset  . Diabetes Mother   . Heart disease Mother   . Hypertension Mother   . Heart attack Mother 24  . Diabetes Sister   . Hypertension Brother   . Heart attack Brother     Social History Social History  Substance Use Topics  . Smoking status: Never Smoker  . Smokeless tobacco: Never Used  . Alcohol use No     Allergies   Percocet [oxycodone-acetaminophen]; Flagyl [metronidazole]; Soap; and Tramadol   Review of Systems Review of Systems  Constitutional: Positive for fatigue. Negative for fever.  Respiratory: Positive for cough.   Cardiovascular: Negative for chest pain.  Gastrointestinal: Positive for nausea and vomiting. Negative for constipation and diarrhea.  Musculoskeletal: Positive for back pain.  Neurological: Positive for weakness.  All other systems reviewed and are negative.    Physical Exam Updated Vital Signs BP 90/70   Pulse 69   Temp 97.6 F (36.4 C) (Oral)   Resp 12   Ht 5\' 3"  (1.6 m)   LMP 05/14/2005   SpO2 100%   Physical Exam CONSTITUTIONAL: Chronically ill appearing HEAD: Normocephalic/atraumatic EYES: EOMI ENMT: Mucous membranes moist NECK: supple no meningeal signs SPINE/BACK:entire spine nontender CV: S1/S2 noted, no murmurs/rubs/gallops noted LUNGS: Lungs are clear to auscultation bilaterally, no apparent distress ABDOMEN: soft, nontender GU:no cva tenderness NEURO: Pt is awake/alert/appropriate, no distress.  Watching TV EXTREMITIES: pulses normal/equal, bilateral AKA SKIN: warm, color normal PSYCH: no abnormalities of mood noted, alert and oriented to situation   ED Treatments / Results  Labs (all  labs ordered are listed, but only abnormal results are displayed) Labs Reviewed  BASIC METABOLIC PANEL - Abnormal; Notable for the following:       Result Value   Sodium 134 (*)    Chloride 90 (*)    CO2 21 (*)    Glucose, Bld 289 (*)    BUN 26 (*)    Creatinine, Ser  5.80 (*)    Calcium 8.6 (*)    GFR calc non Af Amer 8 (*)    GFR calc Af Amer 9 (*)    Anion gap 23 (*)    All other components within normal limits  CBC - Abnormal; Notable for the following:    WBC 12.8 (*)    RDW 18.2 (*)    All other components within normal limits  CBG MONITORING, ED - Abnormal; Notable for the following:    Glucose-Capillary 249 (*)    All other components within normal limits  I-STAT CG4 LACTIC ACID, ED - Abnormal; Notable for the following:    Lactic Acid, Venous 3.85 (*)    All other components within normal limits  CBG MONITORING, ED - Abnormal; Notable for the following:    Glucose-Capillary 282 (*)    All other components within normal limits  I-STAT VENOUS BLOOD GAS, ED - Abnormal; Notable for the following:    pH, Ven 7.446 (*)    pCO2, Ven 37.5 (*)    All other components within normal limits  BLOOD GAS, VENOUS    EKG  EKG Interpretation  Date/Time:  Thursday May 14 2016 17:14:26 EDT Ventricular Rate:  70 PR Interval:  130 QRS Duration: 76 QT Interval:  474 QTC Calculation: 511 R Axis:   80 Text Interpretation:  Normal sinus rhythm Low voltage QRS Possible Inferior infarct , age undetermined Cannot rule out Anterior infarct , age undetermined Prolonged QT Abnormal ECG Confirmed by Bebe ShaggyWICKLINE  MD, Ilaisaane Marts ([id number]) on 05/14/2016 8:48:53 PM       Radiology Dg Chest Portable 1 View  Result Date: 05/14/2016 CLINICAL DATA:  Weakness, nausea and vomiting for 2 days. EXAM: PORTABLE CHEST 1 VIEW COMPARISON:  09/08/2015 FINDINGS: Unchanged moderate cardiomegaly. The lungs are clear. The pulmonary vasculature is normal. There is no large effusion. IMPRESSION: Stable cardiomegaly.   No consolidation or effusion. Electronically Signed   By: Ellery Plunkaniel R Mitchell M.D.   On: 05/14/2016 22:13    Procedures Procedures (including critical care time)  Medications Ordered in ED Medications  ondansetron (ZOFRAN-ODT) 4 MG disintegrating tablet (not administered)  diphenhydrAMINE (BENADRYL) injection 25 mg (not administered)  sodium chloride 0.9 % bolus 250 mL (not administered)  ondansetron (ZOFRAN-ODT) disintegrating tablet 4 mg (4 mg Oral Given 05/14/16 1715)  diphenhydrAMINE (BENADRYL) capsule 25 mg (25 mg Oral Given 05/14/16 2259)  sodium chloride 0.9 % bolus 500 mL (500 mLs Intravenous New Bag/Given 05/14/16 2259)     Initial Impression / Assessment and Plan / ED Course  I have reviewed the triage vital signs and the nursing notes.  Pertinent labs & imaging results that were available during my care of the patient were reviewed by me and considered in my medical decision making (see chart for details).  Clinical Course    Pt reports recent back pain (now improved) with associated fatigue and now vomiting Her main concern is her appetite She is clinically well appearing Dehydration likely due to vomiting but bicarb is appropriate She did reports cough so CX ordered 11:44 PM CXR negative Pt with continued hypotension (mostly in 90s) Apparently she has had issue with hypotension before and is on midodrine She reports her PCP has been managing this She is noted to have anion gap.  Lactate is elevated but also mild hyperglycemia but bicarb is normal.  There could be a component of dehydration but also perhaps early DKA.   She is afebrile, CXR negative, no abd pain and she  does not make urine so sepsis is unlikely  The patient is noted to have a MAP's <65/ SBP's <90. With the current information available to me, I don't think the patient is in septic shock. The MAP's <65/ SBP's <90, is related to an acute condition that is not due to an infectiondehydration,  hyperglycemia.  Pt awake/alert Gentle hydrated started She is having pruritis, benadryl ordered Due to lab abnormalities will admit She will need dialysis tomorrow D/w dr danford for admission   Final Clinical Impressions(s) / ED Diagnoses   Final diagnoses:  Hyperglycemia  Chronic renal failure, unspecified stage  Dehydration    New Prescriptions New Prescriptions   No medications on file     Zadie Rhine, MD 05/14/16 2348

## 2016-05-15 ENCOUNTER — Encounter (HOSPITAL_COMMUNITY): Payer: Self-pay | Admitting: Family Medicine

## 2016-05-15 DIAGNOSIS — Z7902 Long term (current) use of antithrombotics/antiplatelets: Secondary | ICD-10-CM | POA: Diagnosis not present

## 2016-05-15 DIAGNOSIS — N2581 Secondary hyperparathyroidism of renal origin: Secondary | ICD-10-CM | POA: Diagnosis present

## 2016-05-15 DIAGNOSIS — Z8614 Personal history of Methicillin resistant Staphylococcus aureus infection: Secondary | ICD-10-CM | POA: Diagnosis not present

## 2016-05-15 DIAGNOSIS — E1165 Type 2 diabetes mellitus with hyperglycemia: Secondary | ICD-10-CM | POA: Diagnosis present

## 2016-05-15 DIAGNOSIS — E118 Type 2 diabetes mellitus with unspecified complications: Secondary | ICD-10-CM | POA: Diagnosis not present

## 2016-05-15 DIAGNOSIS — Z23 Encounter for immunization: Secondary | ICD-10-CM | POA: Diagnosis not present

## 2016-05-15 DIAGNOSIS — N186 End stage renal disease: Secondary | ICD-10-CM | POA: Diagnosis present

## 2016-05-15 DIAGNOSIS — E7800 Pure hypercholesterolemia, unspecified: Secondary | ICD-10-CM | POA: Diagnosis present

## 2016-05-15 DIAGNOSIS — R112 Nausea with vomiting, unspecified: Secondary | ICD-10-CM | POA: Diagnosis not present

## 2016-05-15 DIAGNOSIS — Z992 Dependence on renal dialysis: Secondary | ICD-10-CM | POA: Diagnosis not present

## 2016-05-15 DIAGNOSIS — I4581 Long QT syndrome: Secondary | ICD-10-CM | POA: Diagnosis not present

## 2016-05-15 DIAGNOSIS — Z89611 Acquired absence of right leg above knee: Secondary | ICD-10-CM | POA: Diagnosis not present

## 2016-05-15 DIAGNOSIS — N179 Acute kidney failure, unspecified: Secondary | ICD-10-CM | POA: Diagnosis present

## 2016-05-15 DIAGNOSIS — K219 Gastro-esophageal reflux disease without esophagitis: Secondary | ICD-10-CM | POA: Diagnosis present

## 2016-05-15 DIAGNOSIS — L299 Pruritus, unspecified: Secondary | ICD-10-CM | POA: Diagnosis not present

## 2016-05-15 DIAGNOSIS — R9431 Abnormal electrocardiogram [ECG] [EKG]: Secondary | ICD-10-CM | POA: Diagnosis present

## 2016-05-15 DIAGNOSIS — I1311 Hypertensive heart and chronic kidney disease without heart failure, with stage 5 chronic kidney disease, or end stage renal disease: Secondary | ICD-10-CM | POA: Diagnosis present

## 2016-05-15 DIAGNOSIS — M545 Low back pain: Secondary | ICD-10-CM | POA: Diagnosis present

## 2016-05-15 DIAGNOSIS — D649 Anemia, unspecified: Secondary | ICD-10-CM | POA: Diagnosis present

## 2016-05-15 DIAGNOSIS — E1122 Type 2 diabetes mellitus with diabetic chronic kidney disease: Secondary | ICD-10-CM | POA: Diagnosis present

## 2016-05-15 DIAGNOSIS — Z89612 Acquired absence of left leg above knee: Secondary | ICD-10-CM | POA: Diagnosis not present

## 2016-05-15 DIAGNOSIS — I959 Hypotension, unspecified: Secondary | ICD-10-CM | POA: Diagnosis not present

## 2016-05-15 DIAGNOSIS — I252 Old myocardial infarction: Secondary | ICD-10-CM | POA: Diagnosis not present

## 2016-05-15 DIAGNOSIS — I9589 Other hypotension: Secondary | ICD-10-CM | POA: Diagnosis present

## 2016-05-15 DIAGNOSIS — Z89021 Acquired absence of right finger(s): Secondary | ICD-10-CM | POA: Diagnosis not present

## 2016-05-15 DIAGNOSIS — Z794 Long term (current) use of insulin: Secondary | ICD-10-CM | POA: Diagnosis not present

## 2016-05-15 DIAGNOSIS — E860 Dehydration: Secondary | ICD-10-CM | POA: Diagnosis present

## 2016-05-15 DIAGNOSIS — E1151 Type 2 diabetes mellitus with diabetic peripheral angiopathy without gangrene: Secondary | ICD-10-CM | POA: Diagnosis present

## 2016-05-15 DIAGNOSIS — I2510 Atherosclerotic heart disease of native coronary artery without angina pectoris: Secondary | ICD-10-CM | POA: Diagnosis present

## 2016-05-15 LAB — CBC
HEMATOCRIT: 35.9 % — AB (ref 36.0–46.0)
Hemoglobin: 12.1 g/dL (ref 12.0–15.0)
MCH: 28.7 pg (ref 26.0–34.0)
MCHC: 33.7 g/dL (ref 30.0–36.0)
MCV: 85.3 fL (ref 78.0–100.0)
Platelets: 205 10*3/uL (ref 150–400)
RBC: 4.21 MIL/uL (ref 3.87–5.11)
RDW: 17.8 % — AB (ref 11.5–15.5)
WBC: 11.5 10*3/uL — ABNORMAL HIGH (ref 4.0–10.5)

## 2016-05-15 LAB — RENAL FUNCTION PANEL
ANION GAP: 22 — AB (ref 5–15)
Albumin: 3.7 g/dL (ref 3.5–5.0)
BUN: 32 mg/dL — ABNORMAL HIGH (ref 6–20)
CHLORIDE: 91 mmol/L — AB (ref 101–111)
CO2: 22 mmol/L (ref 22–32)
Calcium: 8.5 mg/dL — ABNORMAL LOW (ref 8.9–10.3)
Creatinine, Ser: 6.04 mg/dL — ABNORMAL HIGH (ref 0.44–1.00)
GFR calc non Af Amer: 8 mL/min — ABNORMAL LOW (ref >60)
GFR, EST AFRICAN AMERICAN: 9 mL/min — AB (ref >60)
Glucose, Bld: 253 mg/dL — ABNORMAL HIGH (ref 65–99)
Phosphorus: 7.4 mg/dL — ABNORMAL HIGH (ref 2.5–4.6)
Potassium: 3.8 mmol/L (ref 3.5–5.1)
Sodium: 135 mmol/L (ref 135–145)

## 2016-05-15 LAB — GLUCOSE, CAPILLARY
GLUCOSE-CAPILLARY: 246 mg/dL — AB (ref 65–99)
GLUCOSE-CAPILLARY: 269 mg/dL — AB (ref 65–99)
Glucose-Capillary: 101 mg/dL — ABNORMAL HIGH (ref 65–99)
Glucose-Capillary: 147 mg/dL — ABNORMAL HIGH (ref 65–99)
Glucose-Capillary: 174 mg/dL — ABNORMAL HIGH (ref 65–99)

## 2016-05-15 LAB — LACTIC ACID, PLASMA
LACTIC ACID, VENOUS: 2.9 mmol/L — AB (ref 0.5–1.9)
Lactic Acid, Venous: 3.1 mmol/L (ref 0.5–1.9)

## 2016-05-15 LAB — MRSA PCR SCREENING: MRSA by PCR: NEGATIVE

## 2016-05-15 MED ORDER — ATORVASTATIN CALCIUM 40 MG PO TABS
40.0000 mg | ORAL_TABLET | Freq: Every day | ORAL | Status: DC
  Administered 2016-05-15: 40 mg via ORAL
  Filled 2016-05-15 (×2): qty 1

## 2016-05-15 MED ORDER — INSULIN ASPART PROT & ASPART (70-30 MIX) 100 UNIT/ML ~~LOC~~ SUSP
10.0000 [IU] | Freq: Every day | SUBCUTANEOUS | Status: DC
Start: 2016-05-15 — End: 2016-05-16
  Administered 2016-05-15: 10 [IU] via SUBCUTANEOUS

## 2016-05-15 MED ORDER — INSULIN ASPART 100 UNIT/ML ~~LOC~~ SOLN
0.0000 [IU] | Freq: Every day | SUBCUTANEOUS | Status: DC
  Administered 2016-05-15: 2 [IU] via SUBCUTANEOUS

## 2016-05-15 MED ORDER — HYDROXYZINE HCL 10 MG PO TABS
10.0000 mg | ORAL_TABLET | Freq: Four times a day (QID) | ORAL | Status: DC | PRN
  Administered 2016-05-15 (×2): 10 mg via ORAL
  Filled 2016-05-15 (×2): qty 1

## 2016-05-15 MED ORDER — INSULIN ASPART 100 UNIT/ML ~~LOC~~ SOLN
0.0000 [IU] | Freq: Three times a day (TID) | SUBCUTANEOUS | Status: DC
  Administered 2016-05-15: 1 [IU] via SUBCUTANEOUS
  Administered 2016-05-15 – 2016-05-16 (×2): 5 [IU] via SUBCUTANEOUS

## 2016-05-15 MED ORDER — ACETAMINOPHEN 325 MG PO TABS: 650.0000 mg | ORAL_TABLET | Freq: Four times a day (QID) | ORAL | Status: DC | PRN

## 2016-05-15 MED ORDER — ASPIRIN 81 MG PO CHEW
81.0000 mg | CHEWABLE_TABLET | Freq: Every day | ORAL | Status: DC
  Administered 2016-05-15 – 2016-05-16 (×2): 81 mg via ORAL
  Filled 2016-05-15 (×2): qty 1

## 2016-05-15 MED ORDER — SODIUM CHLORIDE 0.9% FLUSH
3.0000 mL | Freq: Two times a day (BID) | INTRAVENOUS | Status: DC
  Administered 2016-05-15 (×3): 3 mL via INTRAVENOUS

## 2016-05-15 MED ORDER — HYDROXYZINE HCL 25 MG PO TABS
25.0000 mg | ORAL_TABLET | Freq: Once | ORAL | Status: AC
  Administered 2016-05-15: 25 mg via ORAL
  Filled 2016-05-15: qty 1

## 2016-05-15 MED ORDER — METHOCARBAMOL 500 MG PO TABS
500.0000 mg | ORAL_TABLET | Freq: Two times a day (BID) | ORAL | Status: DC
  Administered 2016-05-15 – 2016-05-16 (×4): 500 mg via ORAL
  Filled 2016-05-15 (×4): qty 1

## 2016-05-15 MED ORDER — ACETAMINOPHEN 650 MG RE SUPP: 650.0000 mg | Freq: Four times a day (QID) | RECTAL | Status: DC | PRN

## 2016-05-15 MED ORDER — MIDODRINE HCL 5 MG PO TABS
10.0000 mg | ORAL_TABLET | Freq: Three times a day (TID) | ORAL | Status: DC
  Administered 2016-05-15 – 2016-05-16 (×5): 10 mg via ORAL
  Filled 2016-05-15 (×5): qty 2

## 2016-05-15 MED ORDER — HYDROCODONE-ACETAMINOPHEN 5-325 MG PO TABS: 2.0000 | ORAL_TABLET | ORAL | Status: DC | PRN

## 2016-05-15 MED ORDER — INSULIN ASPART PROT & ASPART (70-30 MIX) 100 UNIT/ML ~~LOC~~ SUSP
12.0000 [IU] | Freq: Every day | SUBCUTANEOUS | Status: DC
  Administered 2016-05-16: 12 [IU] via SUBCUTANEOUS
  Filled 2016-05-15: qty 10

## 2016-05-15 MED ORDER — ZOLPIDEM TARTRATE 5 MG PO TABS
5.0000 mg | ORAL_TABLET | Freq: Once | ORAL | Status: AC
Start: 2016-05-15 — End: 2016-05-15
  Administered 2016-05-15: 5 mg via ORAL
  Filled 2016-05-15: qty 1

## 2016-05-15 MED ORDER — CINACALCET HCL 30 MG PO TABS
60.0000 mg | ORAL_TABLET | Freq: Every day | ORAL | Status: DC
  Administered 2016-05-15 – 2016-05-16 (×2): 60 mg via ORAL
  Filled 2016-05-15 (×2): qty 2

## 2016-05-15 MED ORDER — CALCITRIOL 0.5 MCG PO CAPS
ORAL_CAPSULE | ORAL | Status: AC
  Filled 2016-05-15: qty 3

## 2016-05-15 MED ORDER — DIPHENHYDRAMINE HCL 25 MG PO CAPS
25.0000 mg | ORAL_CAPSULE | Freq: Four times a day (QID) | ORAL | Status: DC | PRN
  Administered 2016-05-15: 25 mg via ORAL

## 2016-05-15 MED ORDER — TICAGRELOR 90 MG PO TABS
90.0000 mg | ORAL_TABLET | Freq: Two times a day (BID) | ORAL | Status: DC
  Administered 2016-05-15 – 2016-05-16 (×4): 90 mg via ORAL
  Filled 2016-05-15 (×4): qty 1

## 2016-05-15 MED ORDER — CALCITRIOL 0.5 MCG PO CAPS
1.5000 ug | ORAL_CAPSULE | ORAL | Status: DC
  Administered 2016-05-15: 1.5 ug via ORAL

## 2016-05-15 MED ORDER — DIPHENHYDRAMINE HCL 25 MG PO CAPS
ORAL_CAPSULE | ORAL | Status: AC
Start: 2016-05-15 — End: 2016-05-15
  Filled 2016-05-15: qty 1

## 2016-05-15 MED ORDER — SEVELAMER CARBONATE 800 MG PO TABS
2400.0000 mg | ORAL_TABLET | Freq: Three times a day (TID) | ORAL | Status: DC
  Administered 2016-05-15 – 2016-05-16 (×4): 2400 mg via ORAL
  Filled 2016-05-15 (×5): qty 3

## 2016-05-15 MED ORDER — INFLUENZA VAC SPLIT QUAD 0.5 ML IM SUSY
0.5000 mL | PREFILLED_SYRINGE | INTRAMUSCULAR | Status: AC
  Administered 2016-05-16: 0.5 mL via INTRAMUSCULAR
  Filled 2016-05-15: qty 0.5

## 2016-05-15 MED ORDER — CAMPHOR-MENTHOL 0.5-0.5 % EX LOTN
TOPICAL_LOTION | CUTANEOUS | Status: DC | PRN
  Filled 2016-05-15 (×2): qty 222

## 2016-05-15 MED ORDER — METOCLOPRAMIDE HCL 5 MG PO TABS
5.0000 mg | ORAL_TABLET | Freq: Three times a day (TID) | ORAL | Status: DC
  Administered 2016-05-15 – 2016-05-16 (×3): 5 mg via ORAL
  Filled 2016-05-15 (×4): qty 1

## 2016-05-15 MED ORDER — HEPARIN SODIUM (PORCINE) 5000 UNIT/ML IJ SOLN
5000.0000 [IU] | Freq: Three times a day (TID) | INTRAMUSCULAR | Status: DC
  Administered 2016-05-15 – 2016-05-16 (×4): 5000 [IU] via SUBCUTANEOUS
  Filled 2016-05-15 (×3): qty 1

## 2016-05-15 NOTE — ED Provider Notes (Signed)
[facility]-EMERGENCY DEPT Provider Note   CSN: [id number] Arrival date & time: 05/12/16  1944     History   Chief Complaint Chief Complaint  Patient presents with  . Back Pain  . Abdominal Pain    HPI [name] is a 45 y.o. female.  HPI  Pt with hx of DM, ESRD on dialysis, bilateral leg amputee, presenting with c/o low back pain.  She states pain is worse in different positions.  No vomiting, no fever.  No chest or abdominal pain.  Pt has not missed any dialysis sessions.  Her back pain is in mid and lower back.  She currently denies any abdominal pain- she has hx of pancreatitis but states this does not feel the same.  There are no other associated systemic symptoms, there are no other alleviating or modifying factors.   Past Medical History:  Diagnosis Date  . Coronary artery disease 01/2015   single vessel disease CFX, stented 02/01/2015  . Diabetes mellitus    IDDM  . ESRF (end stage renal failure) (HCC)    dialysis M,W, F; left thigh AV graft  . GERD (gastroesophageal reflux disease)   . High cholesterol   . History of gangrene    left foot  . Hx MRSA infection   . Hypertension    states has been on med. x "a long time"  . Impaired mobility    bilateral AKA, is in a wheelchair  . 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 Myocardial infarction (HCC)    " slight heart attack on 01/31/15  . 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 S/P bilateral above knee amputation [facility])     Patient Active Problem List   Diagnosis Date Noted  . Prolonged QT interval 05/15/2016  . Hypotension, unspecified 05/14/2016  . Renal cyst 09/11/2015  . Essential hypertension 03/06/2015  . Chest pain 02/05/2015  . Elevated troponin   . CAD S/P CFX DES 02/01/15   . PVD (peripheral vascular disease) (HCC)   . Nausea with vomiting   . Acute pulmonary edema (HCC)   . Hemoptysis   . NSTEMI (non-ST elevated myocardial infarction) (HCC)   . HCAP (healthcare-associated  pneumonia) 04/20/2013  . Hyperglycemia 06/23/2012  . MRSA infection 06/07/2012  . 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 Arteriovenous graft infection (HCC) 04/28/2012  . Bacteremia 04/25/2012  . Leucocytosis 04/24/2012  . ESRD on dialysis 04/24/2012  . 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 Anemia 04/24/2012    Past Surgical History:  Procedure Laterality Date  . ABOVE KNEE LEG AMPUTATION  11/08/2009   right  . ABOVE KNEE LEG AMPUTATION  05/21/2009   left  . AV FISTULA PLACEMENT  10/18/2008   right thigh AV graft  . AV FISTULA PLACEMENT  07/18/2004   creation left AV fistula, radial to cephalic  . AV FISTULA REPAIR  07/08/2008   removal left upper arm AV graft  . AV FISTULA REPAIR  07/14/2005   removal infected right forearm AV graft  . CARDIAC CATHETERIZATION N/A 02/01/2015   Procedure: Left Heart Cath and Coronary Angiography;  Surgeon: Lennette Bihari, MD;  Location: [facility] INVASIVE CV LAB;  Service: Cardiovascular;  Laterality: N/A;  . CARDIAC CATHETERIZATION N/A 02/01/2015   Procedure: Coronary Stent Intervention;  Surgeon: Lennette Bihari, MD;  Location: [facility] INVASIVE CV LAB;  Service: Cardiovascular;  Laterality: N/A;  . CARPAL TUNNEL RELEASE  01/26/2012  Procedure: CARPAL TUNNEL RELEASE;  Surgeon: Nicki ReaperGary R Kuzma, MD;  Location: [facility];  Service: Orthopedics;  Laterality: Left;  . CENTRAL VENOUS CATHETER INSERTION  08/06/2009   removal right IJ Diatek cath., placement left IJ Diatek cath.  . CENTRAL VENOUS CATHETER INSERTION  07/10/2008; 12/31/2006; 01/19/2006; 06/19/2005; 09/19/2004; 09/08/2004   right IJ Diatek catheter  . DIALYSIS FISTULA CREATION  01/20/2007   left upper arm AV graft  . DIALYSIS FISTULA CREATION  01/28/2006   right upper arm AV graft  . DIALYSIS FISTULA CREATION  10/04/2005   left forearm AV graft  . DIALYSIS FISTULA CREATION  09/10/2004   right forearm AV graft  . ESOPHAGOGASTRODUODENOSCOPY  06/26/2012     Procedure: ESOPHAGOGASTRODUODENOSCOPY (EGD);  Surgeon: Charna ElizabethJyothi Mann, MD;  [facility];  Service: Endoscopy;  Laterality: N/A;  . EXCISION / CURETTAGE BONE CYST TALUS / CALCANEUS  03/04/2009   partial calcaneal exc. left; placement wound VAC  . FEMORAL ENDARTERECTOMY  11/06/2008   right common femoral; embolectomy right femoral artery; removal right femoral AV graft  . FINGER AMPUTATION  05/27/2010   right middle  . FINGER DEBRIDEMENT  06/20/2010   right middle finger  . FINGER EXPLORATION  07/13/2002   and repair left middle finger  . FOOT AMPUTATION  04/04/2009   left transtibial amputation  . GROIN DEBRIDEMENT  11/16/2008   right rectus femoris muscle flap to right groin wound; right groing debridement  . LEG AMPUTATION BELOW KNEE  10/11/2009   right  . REVISION OF ARTERIOVENOUS GORETEX GRAFT Left 10/01/2015   Procedure: REVISION OF ARTERIOVENOUS GORETEX GRAFT-LEFT THIGH;  Surgeon: Sherren Kernsharles E Fields, MD;  [facility];  Service: Vascular;  Laterality: Left;  . SHUNTOGRAM N/A 11/12/2011   Procedure: Betsey AmenSHUNTOGRAM;  Surgeon: Fransisco HertzBrian L Chen, MD;  [facility];  Service: Cardiovascular;  Laterality: N/A;  . THROMBECTOMY / ARTERIOVENOUS GRAFT REVISION  01/14/2010   thrombectomy left superficial femoral artery; left thigh AV graft placement  . THROMBECTOMY / ARTERIOVENOUS GRAFT REVISION  05/11/2007;12/31/2006; 01/18/2006; 12/16/2005   left upper arm  . THROMBECTOMY / ARTERIOVENOUS GRAFT REVISION  10/26/2005   left forearm AV graft  . THROMBECTOMY / ARTERIOVENOUS GRAFT REVISION  03/11/2005; 10/20/2004   right forearm AV graft  . THROMBECTOMY AND REVISION OF ARTERIOVENTOUS (AV) GORETEX  GRAFT Left 04/24/2012   thigh; replacement of venous limb    OB History    No data available       Home Medications    Prior to Admission medications   Medication Sig Start Date End Date Taking? Authorizing Provider  aspirin 81 MG chewable tablet Chew 81 mg by mouth daily.   Yes Historical Provider,  MD  atorvastatin (LIPITOR) 40 MG tablet Take 1 tablet (40 mg total) by mouth daily at 6 PM. 02/02/15  Yes Calvert CantorSaima Rizwan, MD  cetirizine (ZYRTEC) 10 MG tablet Take 10 mg by mouth daily as needed for allergies.  01/13/16  Yes Historical Provider, MD  cinacalcet (SENSIPAR) 30 MG tablet Take 60 mg by mouth daily.   Yes Historical Provider, MD  insulin aspart protamine-insulin aspart (NOVOLOG 70/30) (70-30) 100 UNIT/ML injection Inject 6-10 Units into the skin 2 (two) times daily with a meal. Take 10 units in the morning and 6 units in the evening   Yes Historical Provider, MD  metoCLOPramide (REGLAN) 5 MG tablet Take 5 mg by mouth 3 (three) times daily before meals.   Yes Historical Provider, MD  metoprolol (LOPRESSOR) 50 MG tablet Take  1 tablet (50 mg total) by mouth 2 (two) times daily. 02/03/15  Yes Calvert Cantor, MD  midodrine (PROAMATINE) 10 MG tablet Take 10 mg by mouth 3 (three) times daily.   Yes Historical Provider, MD  NOVOLIN 70/30 (70-30) 100 UNIT/ML injection Inject 10-12 Units into the skin 2 (two) times daily. 04/07/16  Yes Historical Provider, MD  sevelamer (RENVELA) 800 MG tablet Take 2,400 mg by mouth 3 (three) times daily with meals.    Yes Historical Provider, MD  ticagrelor (BRILINTA) 90 MG TABS tablet Take 1 tablet (90 mg total) by mouth 2 (two) times daily. 02/02/15  Yes Calvert Cantor, MD  HYDROcodone-acetaminophen (NORCO/VICODIN) 5-325 MG tablet Take 2 tablets by mouth every 4 (four) hours as needed. 05/12/16   Jerelyn Scott, MD  methocarbamol (ROBAXIN) 500 MG tablet Take 1 tablet (500 mg total) by mouth 2 (two) times daily. 05/12/16   Jerelyn Scott, MD    Family History Family History  Problem Relation Age of Onset  . Diabetes Mother   . Heart disease Mother   . Hypertension Mother   . Heart attack Mother 31  . Diabetes Sister   . Hypertension Brother   . Heart attack Brother     Social History Social History  Substance Use Topics  . Smoking status: Never Smoker  . Smokeless  tobacco: Never Used  . Alcohol use No     Allergies   Percocet [oxycodone-acetaminophen]; Flagyl [metronidazole]; Soap; and Tramadol   Review of Systems Review of Systems ROS reviewed and all otherwise negative except for mentioned in HPI  Physical Exam Updated Vital Signs BP 102/64 (BP Location: Right Arm)   Pulse 64   Temp 97.5 F (36.4 C) (Oral)   Resp 19   SpO2 95%  Vitals reviewed Physical Exam Physical Examination: General appearance - alert, well appearing, and in no distress Mental status - alert, oriented to person, place, and time Eyes - no conjunctival injection no scleral icterus Chest - clear to auscultation, no wheezes, rales or rhonchi, symmetric air entry Heart - normal rate, regular rhythm, normal S1, S2, no murmurs, rubs, clicks or gallops Abdomen - soft, nontender, nondistended, no masses or organomegaly Back exam - no midline tenderness of spine, no CVA tenderness, ttp over bilateral lumbar paraspinous muscles Neurological - alert, oriented, normal speech Extremities - peripheral pulses normal, no pedal edema, no clubbing or cyanosis s/p bilateral lower extremity amputation Skin - normal coloration and turgor  ED Treatments / Results  Labs (all labs ordered are listed, but only abnormal results are displayed) Labs Reviewed  COMPREHENSIVE METABOLIC PANEL - Abnormal; Notable for the following:       Result Value   Sodium 133 (*)    Chloride 90 (*)    Glucose, Bld 134 (*)    BUN 24 (*)    Creatinine, Ser 6.85 (*)    ALT 12 (*)    Alkaline Phosphatase 128 (*)    GFR calc non Af Amer 6 (*)    GFR calc Af Amer 8 (*)    All other components within normal limits  CBC - Abnormal; Notable for the following:    Hemoglobin 11.8 (*)    RDW 17.2 (*)    All other components within normal limits  LIPASE, BLOOD    EKG  EKG Interpretation None       Radiology Dg Chest Portable 1 View  Result Date: 05/14/2016 CLINICAL DATA:  Weakness, nausea and  vomiting for 2 days. EXAM: PORTABLE CHEST  1 VIEW COMPARISON:  09/08/2015 FINDINGS: Unchanged moderate cardiomegaly. The lungs are clear. The pulmonary vasculature is normal. There is no large effusion. IMPRESSION: Stable cardiomegaly.  No consolidation or effusion. Electronically Signed   By: Ellery Plunk M.D.   On: 05/14/2016 22:13    Procedures Procedures (including critical care time)  Medications Ordered in ED Medications  HYDROcodone-acetaminophen (NORCO/VICODIN) 5-325 MG per tablet 1 tablet (1 tablet Oral Given 05/12/16 2209)     Initial Impression / Assessment and Plan / ED Course  I have reviewed the triage vital signs and the nursing notes.  Pertinent labs & imaging results that were available during my care of the patient were reviewed by me and considered in my medical decision making (see chart for details).  Clinical Course    Pt presenting with low back pain.  Her pain is reproducible on exam and is not in the midline- it is worse in certain positions.  I feel this most likely represents musculoskeletal pain.  She is on dialysis but has not missed any sessions  She no longer makes urine.  She has not had fever.  She currently denies abdominal pain or vomiting.  Pt discharged to start muscle relaxer for her symptoms.  Close f/u with her PMD.  Discharged with strict return precautions.  Pt agreeable with plan.  Final Clinical Impressions(s) / ED Diagnoses   Final diagnoses:  Low back pain, unspecified back pain laterality, with sciatica presence unspecified    New Prescriptions Discharge Medication List as of 05/12/2016 10:18 PM    START taking these medications   Details  methocarbamol (ROBAXIN) 500 MG tablet Take 1 tablet (500 mg total) by mouth 2 (two) times daily., Starting Tue 05/12/2016, Print         Jerelyn Scott, MD 05/16/16 [phone]

## 2016-05-15 NOTE — Progress Notes (Signed)
Patient has been restless and unable to sleep,offered benadryl but per patient it doesn't work. MD on call text paged. Order received in epic. Will continue to monitor. [name], Mary Buttsharito [name], [name]

## 2016-05-15 NOTE — Progress Notes (Signed)
New Admission Note:   Ar[facility] Mental Orientation: Alert and oriented x4 Telemetry: Box #4 Assessment: Completed Skin: see doc flowshet IV: NSL RFA Pain: Denies Tubes: N/A Safety Measures: Safety Fall Prevention Plan has been given, discussed Admission: Completed [address] Orientation: Patient has been orientated to the room, unit and staff.  Family: none at bedside  Orders have been reviewed and implemented. Will continue to monitor the patient. Call light has been placed within reach and bed alarm has been activated.   Toll BrothersCharito Latreece Mochizuki BSN, RN-BC Phone number: [phone]

## 2016-05-15 NOTE — H&P (Signed)
History and Physical  Patient Name: [name]     JXB:[id number]    DOB: 01-Dec-1970    DOA: 05/14/2016 PCP: Billee Cashing, MD   Patient coming from: Home  Chief Complaint: Fatigue, decreased appetite, nausea and vomiting  HPI: [name] is a 45 y.o. 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  The patient started to feel generally bad about a month ago. She thinks that a medicine to raise her blood pressure (presumably midodrine) was started for this reason about a week ago by her nephrologist, but it has made her feel no different.  Yesterday she presented to the emergency room with midline back pain, was prescribed methocarbamol and discharged. Then overnight last night, she had vomiting multiple times, intermittent sweats, and felt generalized malaise and so she returned to the ER.    ED course: -Afebrile, heart rate 70s, respirations normal, blood pressure initially hypertensive, but dropped suddenly in the ER down to the 70s and 80s systolic, pulse oximetry normal -She appeared non-toxic and conversant during this time without fever -Na 134, K 4.0, Cr 5.8, WBC 12.8K, Hgb 12, glucose 249, anion gap 23 -Venous blood gas showed no acidosis, and the bicarbonate was 21 -CXR nonfocal -Lactic acid 3.85 -She was given 750 cc normal saline and TRH were asked to observe overnight because of hypotension and elevated lactic acid     ROS: Review of Systems  Constitutional: Positive for chills (and sweats) and malaise/fatigue (and hyperglycemia). Negative for fever.  Respiratory: Negative for cough, sputum production and shortness of breath.   Gastrointestinal: Positive for abdominal pain, nausea (and decreased appetite) and vomiting (many times, starting last night). Negative for blood in stool and diarrhea.    Genitourinary: Negative.   Musculoskeletal: Positive for back pain (midline).  Skin: Positive for itching (severe intractable for several days). Negative for rash.  All other systems reviewed and are negative.         Past Medical History:  Diagnosis Date  . Coronary artery disease 01/2015   single vessel disease CFX, stented 02/01/2015  . Diabetes mellitus    IDDM  . ESRF (end stage renal failure) (HCC)    dialysis M,W, F; left thigh AV graft  . GERD (gastroesophageal reflux disease)   . High cholesterol   . History of gangrene    left foot  . Hx MRSA infection   . Hypertension    states has been on med. x "a long time"  . Impaired mobility    bilateral AKA, is in a wheelchair  . 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 Myocardial infarction (HCC)    " slight heart attack on 01/31/15  . 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 S/P bilateral above knee amputation [facility])     Past Surgical History:  Procedure Laterality Date  . ABOVE KNEE LEG AMPUTATION  11/08/2009   right  . ABOVE KNEE LEG AMPUTATION  05/21/2009   left  . AV FISTULA PLACEMENT  10/18/2008   right thigh AV graft  . AV FISTULA PLACEMENT  07/18/2004   creation left AV fistula, radial to cephalic  . AV FISTULA REPAIR  07/08/2008   removal left upper arm AV graft  . AV FISTULA REPAIR  07/14/2005   removal infected right forearm AV  graft  . CARDIAC CATHETERIZATION N/A 02/01/2015   Procedure: Left Heart Cath and Coronary Angiography;  Surgeon: Lennette Bihari, MD;  Location: [facility] INVASIVE CV LAB;  Service: Cardiovascular;  Laterality: N/A;  . CARDIAC CATHETERIZATION N/A 02/01/2015   Procedure: Coronary Stent Intervention;  Surgeon: Lennette Bihari, MD;  Location: [facility] INVASIVE CV LAB;  Service: Cardiovascular;  Laterality: N/A;  . CARPAL TUNNEL RELEASE  01/26/2012   Procedure: CARPAL TUNNEL RELEASE;  Surgeon: Nicki Reaper, MD;  Location: [facility];  Service: Orthopedics;  Laterality: Left;   . CENTRAL VENOUS CATHETER INSERTION  08/06/2009   removal right IJ Diatek cath., placement left IJ Diatek cath.  . CENTRAL VENOUS CATHETER INSERTION  07/10/2008; 12/31/2006; 01/19/2006; 06/19/2005; 09/19/2004; 09/08/2004   right IJ Diatek catheter  . DIALYSIS FISTULA CREATION  01/20/2007   left upper arm AV graft  . DIALYSIS FISTULA CREATION  01/28/2006   right upper arm AV graft  . DIALYSIS FISTULA CREATION  10/04/2005   left forearm AV graft  . DIALYSIS FISTULA CREATION  09/10/2004   right forearm AV graft  . ESOPHAGOGASTRODUODENOSCOPY  06/26/2012   Procedure: ESOPHAGOGASTRODUODENOSCOPY (EGD);  Surgeon: Charna Elizabeth, MD;  Location: [facility] ENDOSCOPY;  Service: Endoscopy;  Laterality: N/A;  . EXCISION / CURETTAGE BONE CYST TALUS / CALCANEUS  03/04/2009   partial calcaneal exc. left; placement wound VAC  . FEMORAL ENDARTERECTOMY  11/06/2008   right common femoral; embolectomy right femoral artery; removal right femoral AV graft  . FINGER AMPUTATION  05/27/2010   right middle  . FINGER DEBRIDEMENT  06/20/2010   right middle finger  . FINGER EXPLORATION  07/13/2002   and repair left middle finger  . FOOT AMPUTATION  04/04/2009   left transtibial amputation  . GROIN DEBRIDEMENT  11/16/2008   right rectus femoris muscle flap to right groin wound; right groing debridement  . LEG AMPUTATION BELOW KNEE  10/11/2009   right  . REVISION OF ARTERIOVENOUS GORETEX GRAFT Left 10/01/2015   Procedure: REVISION OF ARTERIOVENOUS GORETEX GRAFT-LEFT THIGH;  Surgeon: Sherren Kerns, MD;  Location[facility];  Service: Vascular;  Laterality: Left;  . SHUNTOGRAM N/A 11/12/2011   Procedure: Betsey Amen;  Surgeon: Fransisco Hertz, MD;  Location: [facility] CATH LAB;  Service: Cardiovascular;  Laterality: N/A;  . THROMBECTOMY / ARTERIOVENOUS GRAFT REVISION  01/14/2010   thrombectomy left superficial femoral artery; left thigh AV graft placement  . THROMBECTOMY / ARTERIOVENOUS GRAFT REVISION  05/11/2007;12/31/2006; 01/18/2006; 12/16/2005   left upper  arm  . THROMBECTOMY / ARTERIOVENOUS GRAFT REVISION  10/26/2005   left forearm AV graft  . THROMBECTOMY / ARTERIOVENOUS GRAFT REVISION  03/11/2005; 10/20/2004   right forearm AV graft  . THROMBECTOMY AND REVISION OF ARTERIOVENTOUS (AV) GORETEX  GRAFT Left 04/24/2012   thigh; replacement of venous limb    Social History: Patient is a double amputee and not ambulatory, has no dementia. She  reports that she has never smoked. She has never used smokeless tobacco. She reports that she does not drink alcohol or use drugs.  Allergies  Allergen Reactions  . Percocet [Oxycodone-Acetaminophen] Other (See Comments)    GI UPSET  . Flagyl [Metronidazole] Other (See Comments)  . Soap Itching    IVORY SOAP  . Tramadol Itching    Family history: family history includes Diabetes in her mother and sister; Heart attack in her brother; Heart attack (age of onset: 19) in her mother; Heart disease in her mother; Hypertension in her brother and mother.  Prior to  Admission medications   Medication Sig Start Date End Date Taking? Authorizing Provider  aspirin 81 MG chewable tablet Chew 81 mg by mouth daily.   Yes Historical Provider, MD  atorvastatin (LIPITOR) 40 MG tablet Take 1 tablet (40 mg total) by mouth daily at 6 PM. 02/02/15  Yes Calvert CantorSaima Rizwan, MD  cetirizine (ZYRTEC) 10 MG tablet Take 10 mg by mouth daily as needed for allergies.  01/13/16  Yes Historical Provider, MD  cinacalcet (SENSIPAR) 30 MG tablet Take 60 mg by mouth daily.   Yes Historical Provider, MD  insulin aspart protamine-insulin aspart (NOVOLOG 70/30) (70-30) 100 UNIT/ML injection Inject 6-10 Units into the skin 2 (two) times daily with a meal. Take 10 units in the morning and 6 units in the evening   Yes Historical Provider, MD  metoCLOPramide (REGLAN) 5 MG tablet Take 5 mg by mouth 3 (three) times daily before meals.   Yes Historical Provider, MD  metoprolol (LOPRESSOR) 50 MG tablet Take 1 tablet (50 mg total) by mouth 2 (two) times daily.  02/03/15  Yes Calvert CantorSaima Rizwan, MD  midodrine (PROAMATINE) 10 MG tablet Take 10 mg by mouth 3 (three) times daily.   Yes Historical Provider, MD  NOVOLIN 70/30 (70-30) 100 UNIT/ML injection Inject 10-12 Units into the skin 2 (two) times daily. 04/07/16  Yes Historical Provider, MD  sevelamer (RENVELA) 800 MG tablet Take 2,400 mg by mouth 3 (three) times daily with meals.    Yes Historical Provider, MD  ticagrelor (BRILINTA) 90 MG TABS tablet Take 1 tablet (90 mg total) by mouth 2 (two) times daily. 02/02/15  Yes Calvert CantorSaima Rizwan, MD  HYDROcodone-acetaminophen (NORCO/VICODIN) 5-325 MG tablet Take 2 tablets by mouth every 4 (four) hours as needed. 05/12/16   Jerelyn ScottMartha Linker, MD  methocarbamol (ROBAXIN) 500 MG tablet Take 1 tablet (500 mg total) by mouth 2 (two) times daily. 05/12/16   Jerelyn ScottMartha Linker, MD       Physical Exam: BP 94/78   Pulse 68   Temp 97.6 F (36.4 C) (Oral)   Resp 12   Ht 5\' 3"  (1.6 m)   LMP 05/14/2005   SpO2 100%  General appearance: Obese chronically ill appearing adult female, alert and in no acut distress, somewhat sleepy from dose of IV Benadryl just given.  Incessantly scratching. Eyes: Anicteric, conjunctiva pink, lids and lashes normal. PERRL.    ENT: No nasal deformity, discharge, epistaxis.  Hearing normal. OP moist without lesions or exudates.   Neck: No neck masses.  Trachea midline.  No thyromegaly/tenderness. Lymph: No cervical or supraclavicular lymphadenopathy. Skin: Warm and dry.  No suspicious rashes or lesions. Cardiac: RRR, nl S1-S2, no murmurs appreciated.  Capillary refill is brisk.  JVP normal.   Respiratory: Normal respiratory rate and rhythm.  CTAB without rales or wheezes. Abdomen: Abdomen soft.  No TTP. No ascites, distension, hepatosplenomegaly given habitus.   MSK: Bilateral AKA.  No cyanosis or clubbing. Neuro: Cranial nerves 3-12 intact.  Sensation intact to light touch. Speech is fluent.  Muscle strength normal in upper extremities.    Psych: Sensorium  intact and responding to questions, attention normal other than sleepy from medicine.  Behavior appropriate.  Affect blunte.  Judgment and insight appear normal.     Labs on Admission:  I have personally reviewed following labs and imaging studies: CBC:  Recent Labs Lab 05/12/16 1958 05/14/16 1716  WBC 9.1 12.8*  HGB 11.8* 12.0  HCT 36.1 36.4  MCV 85.7 86.7  PLT 270 193   Basic  Metabolic Panel:  Recent Labs Lab 05/12/16 1958 05/14/16 1716  NA 133* 134*  K 3.7 4.0  CL 90* 90*  CO2 29 21*  GLUCOSE 134* 289*  BUN 24* 26*  CREATININE 6.85* 5.80*  CALCIUM 10.3 8.6*   GFR: CrCl cannot be calculated (Unknown ideal weight.).  Liver Function Tests:  Recent Labs Lab 05/12/16 1958  AST 25  ALT 12*  ALKPHOS 128*  BILITOT 0.5  PROT 8.1  ALBUMIN 3.8    Recent Labs Lab 05/12/16 1958  LIPASE 29   CBG:  Recent Labs Lab 05/14/16 1712 05/14/16 2257  GLUCAP 249* 282*   Sepsis Labs: Lactic acid 3.8      Radiological Exams on Admission: Personally reviewed and CXR shows large cardiomegaly without focal opacity: Dg Chest Portable 1 View  Result Date: 05/14/2016 CLINICAL DATA:  Weakness, nausea and vomiting for 2 days. EXAM: PORTABLE CHEST 1 VIEW COMPARISON:  09/08/2015 FINDINGS: Unchanged moderate cardiomegaly. The lungs are clear. The pulmonary vasculature is normal. There is no large effusion. IMPRESSION: Stable cardiomegaly.  No consolidation or effusion. Electronically Signed   By: Ellery Plunk M.D.   On: 05/14/2016 22:13    EKG: Independently reviewed. Rate 70, QTC prolonged at 511, sinus rhythm, no ST changes.    Assessment/Plan 1. Hypotension:  Dialysis-related, compounded by recent vomiting.  Transient, and improving.  Despite her elevated lactic acid and leukocytosis, the patient appears clinically stable, non-toxic, mentating well, without focal symptoms, and does not meet SIRS criteria.  Will maintain low suspicion for infection  however. -Gentle fluids given -Obtain blood cultures -Repeat lactic acid in 3 hours -Continue midodrine -Hold metoprolol hold metoprolol for now  2. ESRD on HD MWF:  -Consult to Nephrology for HD -Check BMP and Phos in AM -Continue Cinacalcet, Renvela  3. Nausea and vomiting:  Gentle fluids given.  Will hold further fluids at this time, because BP above 100 systolic while I was in room.   -Continue metoclopramide, monitor QTc  4. HTN and CAD secondary prevention:  -Continue aspirin, statin, take Aguilar -Hold metoprolol  5. Prolonged QT interval:  -Avoid other QT prolonging medications  6. IDDM with hyperglycemia:  AG elevated but do not suspect DKA. -Continue home 70/30 -SSI with meals  7. Back pain:  Suspect this is MSK related. -Continue hydrocodone and methocarbamol PRN     DVT prophylaxis: Heparin  Code Status: Full  Family Communication: None present  Disposition Plan: Anticipate observation overnight and monitor BP.  HD tomorrow. Consults called: Nephrology Admission status: OBS, tele      Medical decision making: Patient seen at 11:45 PM on 05/14/2016.  The patient was discussed with Dr. Bebe Shaggy.  What exists of the patient's chart was reviewed in depth and summarized above.  Clinical condition: stable.        Alberteen Sam [facility] Pager [phone]

## 2016-05-15 NOTE — Progress Notes (Signed)
CRITICAL VALUE ALERT  Critical value received: Lactic acid 3.1  Date of notification:  05/15/16  Time of notification:  0454  Critical value read back:Yes.    Nurse who received alert:  Kermit Baloharito Riyanshi Wahab MD notified (1st page):  Dr. Pixie CasinoKim,J  Time of first page:  [phone]  MD notified (2nd page):  Time of second page:  Responding MD:  Kim,James,MD  Time MD responded:  [phone]

## 2016-05-15 NOTE — Progress Notes (Signed)
Received report from ED RN. Room ready for patient. Wallace Cogliano [name], RN 

## 2016-05-15 NOTE — ED Notes (Signed)
Attempted report x 2 

## 2016-05-15 NOTE — Progress Notes (Addendum)
PROGRESS NOTE    Mary QuanRegina F Nazar  UUV:[id number] DOB: Apr 03, 1971 DOA: 05/14/2016 PCP: Billee CashingMCKENZIE, WAYLAND, MD    Brief Narrative: 45 year old female with history of ESRD on hemodialysis Monday Wednesday Friday, bilateral AKA amputee, diabetes, coronary artery disease, dialysis related hypotension on midodrine at home presented with generalized weakness, nausea vomiting. Patient was found to have elevated lactic acid level, leukocytosis. Blood pressure was low in the ER.  Assessment & Plan:   #  Hypotension in dialysis patient: Patient reported chronic hypertension requiring midodrine at home. Lower blood pressure is likely due to vomiting and decreased oral intake. Continue midodrine and monitor blood pressure closely. -Patient has mild elevation in lactic acid level high were no sign of infection. Follow up culture results. Holding antibiotics.  # ESRD on dialysis: nephrology consulted this morning. Receiving hemodialysis treatment as per nephrologist. Continue renal medications  #  Insulin dependent diabetes mellitus with complications [facility](HCC): Continue current insulin regimen. Monitor blood sugar level.    #  Nausea with vomiting and generalized weakness on admission likely due to uremia: Symptoms resolved in the morning. Denies abdomen pain. Encourage oral intake. Abdomen exam benign. -Continue Reglan.   # h/o CAD S/P CFX DES 02/01/15; continue aspirin, Lipitor. Beta blocker on hold because of hypotension.  #Itching in dialysis patient: Not improving with Benadryl. Added Atarax. Receiving dialysis as per nephrologist.    DVT prophylaxis: Heparin subcutaneous. Code Status: Full  Family Communication: No family present at bedside. Disposition Plan: Likely discharge to home in 1-2 days..   Consultants:  Nephrologist.   Subjective: Patient was seen and examined at dialysis unit. Patient was complaining of generalized itching not improving with oral Bactrim. Denied nausea, vomiting,  abdominal pain. Feels generally weak.   Objective: Vitals:   05/15/16 1130 05/15/16 1157 05/15/16 1217 05/15/16 1312  BP: 105/61 100/66 96/66 (!) 109/94  Pulse: 71 71 74 98  Resp:   15 16  Temp:   97.8 F (36.6 C) 97.9 F (36.6 C)  TempSrc:   Oral Oral  SpO2:  100% 100% 92%  Weight:   78.6 kg (173 lb 4.5 oz)   Height:        Intake/Output Summary (Last 24 hours) at 05/15/16 1419 Last data filed at 05/15/16 1404  Gross per 24 hour  Intake              990 ml  Output              300 ml  Net              690 ml   Filed Weights   05/15/16 0128 05/15/16 1217  Weight: 78.9 kg (174 lb) 78.6 kg (173 lb 4.5 oz)    Examination:  General exam: Not in distress, scratching because of worsening itching.  Respiratory system: Clear to auscultation. Respiratory effort normal. Cardiovascular system: S1 & S2 heard, RRR.  Gastrointestinal system: Abdomen is nondistended, soft and nontender. Normal bowel sounds heard. Central nervous system: Alert and oriented. Following commands Extremities: Bilateral AKA. Skin: No obvious rash or ulcer.    Data Reviewed: I have personally reviewed following labs and imaging studies  CBC:  Recent Labs Lab 05/12/16 1958 05/14/16 1716 05/15/16 0344  WBC 9.1 12.8* 11.5*  HGB 11.8* 12.0 12.1  HCT 36.1 36.4 35.9*  MCV 85.7 86.7 85.3  PLT 270 193 205   Basic Metabolic Panel:  Recent Labs Lab 05/12/16 1958 05/14/16 1716 05/15/16 0344  NA 133* 134* 135  K  3.7 4.0 3.8  CL 90* 90* 91*  CO2 29 21* 22  GLUCOSE 134* 289* 253*  BUN 24* 26* 32*  CREATININE 6.85* 5.80* 6.04*  CALCIUM 10.3 8.6* 8.5*  PHOS  --   --  7.4*   GFR: Estimated Creatinine Clearance: 11.6 mL/min (by C-G formula based on SCr of 6.04 mg/dL (H)). Liver Function Tests:  Recent Labs Lab 05/12/16 1958 05/15/16 0344  AST 25  --   ALT 12*  --   ALKPHOS 128*  --   BILITOT 0.5  --   PROT 8.1  --   ALBUMIN 3.8 3.7    Recent Labs Lab 05/12/16 1958  LIPASE 29   No  results for input(s): AMMONIA in the last 168 hours. Coagulation Profile: No results for input(s): INR, PROTIME in the last 168 hours. Cardiac Enzymes: No results for input(s): CKTOTAL, CKMB, CKMBINDEX, TROPONINI in the last 168 hours. BNP (last 3 results) No results for input(s): PROBNP in the last 8760 hours. HbA1C: No results for input(s): HGBA1C in the last 72 hours. CBG:  Recent Labs Lab 05/14/16 1712 05/14/16 2257 05/15/16 0126 05/15/16 0804 05/15/16 1309  GLUCAP 249* 282* 246* 269* 101*   Lipid Profile: No results for input(s): CHOL, HDL, LDLCALC, TRIG, CHOLHDL, LDLDIRECT in the last 72 hours. Thyroid Function Tests: No results for input(s): TSH, T4TOTAL, FREET4, T3FREE, THYROIDAB in the last 72 hours. Anemia Panel: No results for input(s): VITAMINB12, FOLATE, FERRITIN, TIBC, IRON, RETICCTPCT in the last 72 hours. Sepsis Labs:  Recent Labs Lab 05/14/16 2305 05/15/16 0344  LATICACIDVEN 3.85* 3.1*    Recent Results (from the past 240 hour(s))  MRSA PCR Screening     Status: None   Collection Time: 05/15/16  2:09 AM  Result Value Ref Range Status   MRSA by PCR NEGATIVE NEGATIVE Final    Comment:        The GeneXpert MRSA Assay (FDA approved for NASAL specimens only), is one component of a comprehensive MRSA colonization surveillance program. It is not intended to diagnose MRSA infection nor to guide or monitor treatment for MRSA infections.          Radiology Studies: Dg Chest Portable 1 View  Result Date: 05/14/2016 CLINICAL DATA:  Weakness, nausea and vomiting for 2 days. EXAM: PORTABLE CHEST 1 VIEW COMPARISON:  09/08/2015 FINDINGS: Unchanged moderate cardiomegaly. The lungs are clear. The pulmonary vasculature is normal. There is no large effusion. IMPRESSION: Stable cardiomegaly.  No consolidation or effusion. Electronically Signed   By: Ellery Plunk M.D.   On: 05/14/2016 22:13        Scheduled Meds: . aspirin  81 mg Oral Daily  .  atorvastatin  40 mg Oral q1800  . calcitRIOL  1.5 mcg Oral Q M,W,F-HD  . cinacalcet  60 mg Oral Q breakfast  . heparin  5,000 Units Subcutaneous Q8H  . [START ON 05/16/2016] Influenza vac split quadrivalent PF  0.5 mL Intramuscular Tomorrow-1000  . insulin aspart  0-5 Units Subcutaneous QHS  . insulin aspart  0-9 Units Subcutaneous TID WC  . insulin aspart protamine- aspart  10 Units Subcutaneous Q supper  . insulin aspart protamine- aspart  12 Units Subcutaneous Q breakfast  . methocarbamol  500 mg Oral BID  . metoCLOPramide  5 mg Oral TID AC  . midodrine  10 mg Oral TID WC  . sevelamer carbonate  2,400 mg Oral TID WC  . sodium chloride flush  3 mL Intravenous Q12H  . ticagrelor  90 mg  Oral BID   Continuous Infusions:    LOS: 0 days    Time spent: 26 minutes    Dron Jaynie Collins, MD [facility] Pager [phone]  If 7PM-7AM, please contact night-coverage [email or web address] Password [email or web address] 05/15/2016, 2:19 PM

## 2016-05-15 NOTE — ED Notes (Signed)
Attempted Report 

## 2016-05-16 LAB — BASIC METABOLIC PANEL
ANION GAP: 22 — AB (ref 5–15)
BUN: 19 mg/dL (ref 6–20)
CALCIUM: 9.1 mg/dL (ref 8.9–10.3)
CO2: 21 mmol/L — ABNORMAL LOW (ref 22–32)
CREATININE: 4.58 mg/dL — AB (ref 0.44–1.00)
Chloride: 93 mmol/L — ABNORMAL LOW (ref 101–111)
GFR, EST AFRICAN AMERICAN: 12 mL/min — AB (ref >60)
GFR, EST NON AFRICAN AMERICAN: 11 mL/min — AB (ref >60)
Glucose, Bld: 178 mg/dL — ABNORMAL HIGH (ref 65–99)
Potassium: 4 mmol/L (ref 3.5–5.1)
SODIUM: 136 mmol/L (ref 135–145)

## 2016-05-16 LAB — CBC
HEMATOCRIT: 37.9 % (ref 36.0–46.0)
Hemoglobin: 12.6 g/dL (ref 12.0–15.0)
MCH: 28.7 pg (ref 26.0–34.0)
MCHC: 33.2 g/dL (ref 30.0–36.0)
MCV: 86.3 fL (ref 78.0–100.0)
Platelets: 211 10*3/uL (ref 150–400)
RBC: 4.39 MIL/uL (ref 3.87–5.11)
RDW: 17.9 % — AB (ref 11.5–15.5)
WBC: 13.6 10*3/uL — AB (ref 4.0–10.5)

## 2016-05-16 LAB — LACTIC ACID, PLASMA: LACTIC ACID, VENOUS: 3.1 mmol/L — AB (ref 0.5–1.9)

## 2016-05-16 LAB — GLUCOSE, CAPILLARY
GLUCOSE-CAPILLARY: 284 mg/dL — AB (ref 65–99)
GLUCOSE-CAPILLARY: 203 mg/dL — AB (ref 65–99)

## 2016-05-16 NOTE — Consult Note (Signed)
Referring Provider: No ref. provider found Primary Care Physician:  Billee Cashing, MD Primary Nephrologist:  Dr. Hyman Hopes  Reason for Consultation:   Medical management of ESRD including anemia and secondary  Hyperparathyroidism  HPI: [name] is a 45 y.o. 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  The patient started to feel generally bad about a month ago. She thinks that a medicine to raise her blood pressure (presumably midodrine) was started for this reason about a week ago by her nephrologist, but it has made her feel no different.  In ER she was found to be hypertensive with a leukocytosis and a lactate level of 3.85   Past Medical History:  Diagnosis Date  . Coronary artery disease 01/2015   single vessel disease CFX, stented 02/01/2015  . Diabetes mellitus    IDDM  . ESRF (end stage renal failure) (HCC)    dialysis M,W, F; left thigh AV graft  . GERD (gastroesophageal reflux disease)   . High cholesterol   . History of gangrene    left foot  . Hx MRSA infection   . Hypertension    states has been on med. x "a long time"  . Impaired mobility    bilateral AKA, is in a wheelchair  . 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 Myocardial infarction (HCC)    " slight heart attack on 01/31/15  . 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 S/P bilateral above knee amputation [facility])     Past Surgical History:  Procedure Laterality Date  . ABOVE KNEE LEG AMPUTATION  11/08/2009   right  . ABOVE KNEE LEG AMPUTATION  05/21/2009   left  . AV FISTULA PLACEMENT  10/18/2008   right thigh AV graft  . AV FISTULA PLACEMENT  07/18/2004   creation left AV fistula, radial to cephalic  . AV FISTULA REPAIR  07/08/2008   removal left upper arm AV graft  . AV FISTULA REPAIR  07/14/2005   removal infected right  forearm AV graft  . CARDIAC CATHETERIZATION N/A 02/01/2015   Procedure: Left Heart Cath and Coronary Angiography;  Surgeon: Lennette Bihari, MD;  Location: [facility] INVASIVE CV LAB;  Service: Cardiovascular;  Laterality: N/A;  . CARDIAC CATHETERIZATION N/A 02/01/2015   Procedure: Coronary Stent Intervention;  Surgeon: Lennette Bihari, MD;  Location: [facility] INVASIVE CV LAB;  Service: Cardiovascular;  Laterality: N/A;  . CARPAL TUNNEL RELEASE  01/26/2012   Procedure: CARPAL TUNNEL RELEASE;  Surgeon: Nicki Reaper, MD;  Location: [facility];  Service: Orthopedics;  Laterality: Left;  . CENTRAL VENOUS CATHETER INSERTION  08/06/2009   removal right IJ Diatek cath., placement left IJ Diatek cath.  . CENTRAL VENOUS CATHETER INSERTION  07/10/2008; 12/31/2006; 01/19/2006; 06/19/2005; 09/19/2004; 09/08/2004   right IJ Diatek catheter  . DIALYSIS FISTULA CREATION  01/20/2007   left upper arm AV graft  . DIALYSIS FISTULA CREATION  01/28/2006   right upper arm AV graft  . DIALYSIS FISTULA CREATION  10/04/2005   left forearm AV graft  . DIALYSIS FISTULA CREATION  09/10/2004   right forearm AV graft  . ESOPHAGOGASTRODUODENOSCOPY  06/26/2012   Procedure: ESOPHAGOGASTRODUODENOSCOPY (EGD);  Surgeon: Charna Elizabeth, MD;  Location: [facility] ENDOSCOPY;  Service: Endoscopy;  Laterality: N/A;  . EXCISION / CURETTAGE BONE CYST  TALUS / CALCANEUS  03/04/2009   partial calcaneal exc. left; placement wound VAC  . FEMORAL ENDARTERECTOMY  11/06/2008   right common femoral; embolectomy right femoral artery; removal right femoral AV graft  . FINGER AMPUTATION  05/27/2010   right middle  . FINGER DEBRIDEMENT  06/20/2010   right middle finger  . FINGER EXPLORATION  07/13/2002   and repair left middle finger  . FOOT AMPUTATION  04/04/2009   left transtibial amputation  . GROIN DEBRIDEMENT  11/16/2008   right rectus femoris muscle flap to right groin wound; right groing debridement  . LEG AMPUTATION BELOW KNEE  10/11/2009   right  . REVISION OF  ARTERIOVENOUS GORETEX GRAFT Left 10/01/2015   Procedure: REVISION OF ARTERIOVENOUS GORETEX GRAFT-LEFT THIGH;  Surgeon: Sherren Kernsharles E Fields, MD;  [facility];  Service: Vascular;  Laterality: Left;  . SHUNTOGRAM N/A 11/12/2011   Procedure: Betsey AmenSHUNTOGRAM;  Surgeon: Fransisco HertzBrian L Chen, MD;  [facility];  Service: Cardiovascular;  Laterality: N/A;  . THROMBECTOMY / ARTERIOVENOUS GRAFT REVISION  01/14/2010   thrombectomy left superficial femoral artery; left thigh AV graft placement  . THROMBECTOMY / ARTERIOVENOUS GRAFT REVISION  05/11/2007;12/31/2006; 01/18/2006; 12/16/2005   left upper arm  . THROMBECTOMY / ARTERIOVENOUS GRAFT REVISION  10/26/2005   left forearm AV graft  . THROMBECTOMY / ARTERIOVENOUS GRAFT REVISION  03/11/2005; 10/20/2004   right forearm AV graft  . THROMBECTOMY AND REVISION OF ARTERIOVENTOUS (AV) GORETEX  GRAFT Left 04/24/2012   thigh; replacement of venous limb    Prior to Admission medications   Medication Sig Start Date End Date Taking? Authorizing Provider  aspirin 81 MG chewable tablet Chew 81 mg by mouth daily.   Yes Historical Provider, MD  atorvastatin (LIPITOR) 40 MG tablet Take 1 tablet (40 mg total) by mouth daily at 6 PM. 02/02/15  Yes Calvert CantorSaima Rizwan, MD  cetirizine (ZYRTEC) 10 MG tablet Take 10 mg by mouth daily as needed for allergies.  01/13/16  Yes Historical Provider, MD  cinacalcet (SENSIPAR) 30 MG tablet Take 60 mg by mouth daily.   Yes Historical Provider, MD  insulin aspart protamine-insulin aspart (NOVOLOG 70/30) (70-30) 100 UNIT/ML injection Inject 6-10 Units into the skin 2 (two) times daily with a meal. Take 10 units in the morning and 6 units in the evening   Yes Historical Provider, MD  metoCLOPramide (REGLAN) 5 MG tablet Take 5 mg by mouth 3 (three) times daily before meals.   Yes Historical Provider, MD  metoprolol (LOPRESSOR) 50 MG tablet Take 1 tablet (50 mg total) by mouth 2 (two) times daily. 02/03/15  Yes Calvert CantorSaima Rizwan, MD  midodrine (PROAMATINE) 10 MG tablet  Take 10 mg by mouth 3 (three) times daily.   Yes Historical Provider, MD  NOVOLIN 70/30 (70-30) 100 UNIT/ML injection Inject 10-12 Units into the skin 2 (two) times daily. 04/07/16  Yes Historical Provider, MD  sevelamer (RENVELA) 800 MG tablet Take 2,400 mg by mouth 3 (three) times daily with meals.    Yes Historical Provider, MD  ticagrelor (BRILINTA) 90 MG TABS tablet Take 1 tablet (90 mg total) by mouth 2 (two) times daily. 02/02/15  Yes Calvert CantorSaima Rizwan, MD  HYDROcodone-acetaminophen (NORCO/VICODIN) 5-325 MG tablet Take 2 tablets by mouth every 4 (four) hours as needed. 05/12/16   Jerelyn ScottMartha Linker, MD  methocarbamol (ROBAXIN) 500 MG tablet Take 1 tablet (500 mg total) by mouth 2 (two) times daily. 05/12/16   Jerelyn ScottMartha Linker, MD    Current Facility-Administered Medications  Medication Dose Route  Frequency Provider Last Rate Last Dose  . acetaminophen (TYLENOL) tablet 650 mg  650 mg Oral Q6H PRN Alberteen Sam, MD       Or  . acetaminophen (TYLENOL) suppository 650 mg  650 mg Rectal Q6H PRN Alberteen Sam, MD      . aspirin chewable tablet 81 mg  81 mg Oral Daily Alberteen Sam, MD   81 mg at 05/15/16 1436  . atorvastatin (LIPITOR) tablet 40 mg  40 mg Oral q1800 Alberteen Sam, MD   40 mg at 05/15/16 1811  . calcitRIOL (ROCALTROL) capsule 1.5 mcg  1.5 mcg Oral Q M,W,F-HD Weston Settle, PA-C   1.5 mcg at 05/15/16 1148  . camphor-menthol (SARNA) lotion   Topical PRN Dron Jaynie Collins, MD      . cinacalcet [facility]) tablet 60 mg  60 mg Oral Q breakfast Alberteen Sam, MD   60 mg at 05/16/16 0824  . diphenhydrAMINE (BENADRYL) capsule 25 mg  25 mg Oral Q6H PRN Alberteen Sam, MD   25 mg at 05/15/16 0925  . heparin injection 5,000 Units  5,000 Units Subcutaneous Q8H Alberteen Sam, MD   5,000 Units at 05/16/16 0538  . HYDROcodone-acetaminophen (NORCO/VICODIN) 5-325 MG per tablet 2 tablet  2 tablet Oral Q4H PRN Alberteen Sam, MD      . hydrOXYzine  (ATARAX/VISTARIL) tablet 10 mg  10 mg Oral Q6H PRN Dron Jaynie Collins, MD   10 mg at 05/15/16 2239  . Influenza vac split quadrivalent PF (FLUARIX) injection 0.5 mL  0.5 mL Intramuscular Tomorrow-1000 Alberteen Sam, MD      . insulin aspart (novoLOG) injection 0-5 Units  0-5 Units Subcutaneous QHS Alberteen Sam, MD   2 Units at 05/15/16 0145  . insulin aspart (novoLOG) injection 0-9 Units  0-9 Units Subcutaneous TID WC Alberteen Sam, MD   5 Units at 05/16/16 0829  . insulin aspart protamine- aspart (NOVOLOG MIX 70/30) injection 10 Units  10 Units Subcutaneous Q supper Alberteen Sam, MD   10 Units at 05/15/16 1820  . insulin aspart protamine- aspart (NOVOLOG MIX 70/30) injection 12 Units  12 Units Subcutaneous Q breakfast Alberteen Sam, MD   12 Units at 05/16/16 [phone]  . methocarbamol (ROBAXIN) tablet 500 mg  500 mg Oral BID Alberteen Sam, MD   500 mg at 05/15/16 2233  . metoCLOPramide (REGLAN) tablet 5 mg  5 mg Oral TID AC Alberteen Sam, MD   5 mg at 05/16/16 0824  . midodrine (PROAMATINE) tablet 10 mg  10 mg Oral TID WC Alberteen Sam, MD   10 mg at 05/16/16 0833  . sevelamer carbonate (RENVELA) tablet 2,400 mg  2,400 mg Oral TID WC Alberteen Sam, MD   2,400 mg at 05/16/16 0824  . sodium chloride flush (NS) 0.9 % injection 3 mL  3 mL Intravenous Q12H Alberteen Sam, MD   3 mL at 05/15/16 2233  . ticagrelor (BRILINTA) tablet 90 mg  90 mg Oral BID Alberteen Sam, MD   90 mg at 05/15/16 2233    Allergies as of 05/14/2016 - Review Complete 05/14/2016  Allergen Reaction Noted  . Percocet [oxycodone-acetaminophen] Other (See Comments) 02/05/2011  . Flagyl [metronidazole] Other (See Comments) 09/08/2015  . Soap Itching 01/22/2012  . Tramadol Itching 08/03/2012    Family History  Problem Relation Age of Onset  . Diabetes Mother   . Heart disease Mother   . Hypertension Mother   .  Heart attack Mother 40  .  Diabetes Sister   . Hypertension Brother   . Heart attack Brother     Social History   Social History  . Marital status: Single    Spouse name: N/A  . Number of children: N/A  . Years of education: N/A   Occupational History  . Not on file.   Social History Main Topics  . Smoking status: Never Smoker  . Smokeless tobacco: Never Used  . Alcohol use No  . Drug use: No  . Sexual activity: Not on file   Other Topics Concern  . Not on file   Social History Narrative  . No narrative on file    Review of Systems: Constitutional: Positive for chills (and sweats) and malaise/fatigue (and hyperglycemia). Negative for fever.  Respiratory: Negative for cough, sputum production and shortness of breath.   Gastrointestinal: Positive for abdominal pain, nausea (and decreased appetite) and vomiting (many times, starting last night). Negative for blood in stool and diarrhea.  Genitourinary: Negative.   Musculoskeletal: Positive for back pain (midline).  Skin: Positive for itching (severe intractable for several days). Negative for rash.  All other systems reviewed and are negative.  Physical Exam: Vital signs in last 24 hours: Temp:  [97.3 F (36.3 C)-97.9 F (36.6 C)] 97.9 F (36.6 C) (09/16 0808) Pulse Rate:  [68-98] 79 (09/16 0808) Resp:  [15-18] 18 (09/16 0808) BP: (83-109)/(58-94) 97/80 (09/16 0808) SpO2:  [92 %-100 %] 98 % (09/16 0808) Weight:  [78.6 kg (173 lb 4.5 oz)-79.9 kg (176 lb 2.4 oz)] 79.9 kg (176 lb 2.4 oz) (09/15 2023) Last BM Date: 05/14/16 General appearance: Obese chronically ill appearing adult female, alert and in no acut distress, somewhat sleepy from dose of IV Benadryl just given.  Incessantly scratching. Eyes: Anicteric, conjunctiva pink, lids and lashes normal. PERRL.    ENT: No nasal deformity, discharge, epistaxis.  Hearing normal. OP moist without lesions or exudates.   Neck: No neck masses.  Trachea midline.  No thyromegaly/tenderness. Lymph: No  cervical or supraclavicular lymphadenopathy. Skin: Warm and dry.  No suspicious rashes or lesions. Cardiac: RRR, nl S1-S2, no murmurs appreciated.  Capillary refill is brisk.  JVP normal.   Respiratory: Normal respiratory rate and rhythm.  CTAB without rales or wheezes. Abdomen: Abdomen soft.  No TTP. No ascites, distension, hepatosplenomegaly given habitus.   MSK: Bilateral AKA.  No cyanosis or clubbing. Neuro: Cranial nerves 3-12 intact.  Sensation intact to light touch. Speech is fluent.  Muscle strength normal in upper extremities.    Psych: Sensorium intact and responding to questions, attention normal other than sleepy from medicine.  Behavior appropriate.  Affect blunte.  Judgment and insight appear normal.  Intake/Output from previous day: 09/15 0701 - 09/16 0700 In: 600 [P.O.:600] Out: 300  Intake/Output this shift: No intake/output data recorded.  Lab Results:  Recent Labs  05/14/16 1716 05/15/16 0344 05/16/16 0319  WBC 12.8* 11.5* 13.6*  HGB 12.0 12.1 12.6  HCT 36.4 35.9* 37.9  PLT 193 205 211   BMET  Recent Labs  05/14/16 1716 05/15/16 0344 05/16/16 0319  NA 134* 135 136  K 4.0 3.8 4.0  CL 90* 91* 93*  CO2 21* 22 21*  GLUCOSE 289* 253* 178*  BUN 26* 32* 19  CREATININE 5.80* 6.04* 4.58*  CALCIUM 8.6* 8.5* 9.1  PHOS  --  7.4*  --    LFT  Recent Labs  05/15/16 0344  ALBUMIN 3.7   PT/INR No results for  input(s): LABPROT, INR in the last 72 hours. Hepatitis Panel No results for input(s): HEPBSAG, HCVAB, HEPAIGM, HEPBIGM in the last 72 hours.  Studies/Results: Dg Chest Portable 1 View  Result Date: 05/14/2016 CLINICAL DATA:  Weakness, nausea and vomiting for 2 days. EXAM: PORTABLE CHEST 1 VIEW COMPARISON:  09/08/2015 FINDINGS: Unchanged moderate cardiomegaly. The lungs are clear. The pulmonary vasculature is normal. There is no large effusion. IMPRESSION: Stable cardiomegaly.  No consolidation or effusion. Electronically Signed   By: Ellery Plunk  M.D.   On: 05/14/2016 22:13    Assessment/Plan: 1. Hypotension:  Dialysis-related, compounded by recent vomiting.  Transient, and improving.  Despite her elevated lactic acid and leukocytosis, the patient appears clinically stable, non-toxic, mentating well, without focal symptoms, and does not meet SIRS criteria. Continue midodrine   2. ESRD on HD MWF: dialysis uneventful yesterday --- will not need dialysis until Monday    3. Nausea and vomiting:  Appears to be resolved    4. HTN and CAD secondary prevention:  -Continue aspirin, statin, take Aguilar -Hold metoprolol  5. Prolonged QT interval:  -Avoid other QT prolonging medications  6. IDDM with hyperglycemia:  Per hospitalist service   7. Anemia  Hb 12.6   8.  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

## 2016-05-16 NOTE — Progress Notes (Signed)
Patient refused all her noontime medications."Im ready to go ,ill take my home medicines when i'll get home'.

## 2016-05-16 NOTE — Discharge Summary (Signed)
Physician Discharge Summary  [name] ZOX:[id number] DOB: 1971/01/26 DOA: 05/14/2016  PCP: Billee Cashing, MD  Admit date: 05/14/2016 Discharge date: 05/16/2016  Admitted From: home Disposition: home  Recommendations for Outpatient Follow-up:  1. Follow up with PCP in 1-2 weeks 2. Please obtain BMP/CBC in one week 3. Please follow up on the following pending results:blood culture result with your PCP or nephrologist.  Home Health:no Equipment/Devices:no  Discharge Condition:stable CODE STATUS:full Diet recommendation: carb modified heart healthy  Brief/Interim Summary:45 y.o. 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 Patient appeared nontoxic and in were sent during hospitalization. Patient was found to have elevated lactic acid level with no acidosis.  #  Hypotension in dialysis patient: Patient reported chronic hypotension requiring midodrine at home. Lower blood pressure is likely due to vomiting and decreased oral intake. Continued midodrine  with improvement in blood pressure. -Patient has mild elevation in lactic acid level with no sign of infection. She has no fever and her symptoms improved in the hospital. Patient denied chills, headache, nausea, vomiting. No sign of infection. She appears clinically stable. Cultures are now growing so far. On reviewing patient's medical record, patient has had elevated lactic acid level in the past. I advised patient to follow-up with her nephrologist and continue hemodialysis treatment. -No sign of infection or tenderness around the dialysis access site.  # ESRD on dialysis:  Receiving hemodialysis treatment as per nephrologist. Continue renal medications  #  Insulin dependent diabetes mellitus with  complications (HCC): Continue current insulin regimen. Monitor blood sugar level. Advised to follow-up with PCP.    #  Nausea with vomiting and generalized weakness on admission likely due to uremia: Symptoms resolved.  Today, patient denied fever, chills, nausea, vomiting, abdominal pain. She is able to tolerate diet well.    # h/o CAD S/P CFX DES 02/01/15; continue aspirin, Lipitor. Beta blocker on hold because of hypotension. Advised to follow-up with her PCP and nephrologist outpatient.  #Itching in dialysis patient: Benadryl as needed and recommended to continue dialysis treatment outpatient.   Discharge Diagnoses:  Principal Problem:   Hypotension, unspecified Active Problems:   ESRD on dialysis   Insulin dependent diabetes mellitus with complications (HCC)   Hyperglycemia   Nausea with vomiting   CAD S/P CFX DES 02/01/15        Discharge Instructions  Discharge Instructions    Call MD for:  difficulty breathing, headache or visual disturbances    Complete by:  As directed    Diet - low sodium heart healthy    Complete by:  As directed    Discharge instructions    Complete by:  As directed    Please follow up with your PCP and nephrologist.   Increase activity slowly    Complete by:  As directed        Medication List    STOP taking these medications   metoprolol 50 MG tablet Commonly known as:  LOPRESSOR     TAKE these medications   aspirin 81 MG chewable tablet Chew 81 mg by mouth daily.   atorvastatin 40 MG tablet Commonly known as:  LIPITOR Take 1 tablet (40 mg total) by mouth daily at 6 PM.   cetirizine 10 MG tablet Commonly known as:  ZYRTEC  Take 10 mg by mouth daily as needed for allergies.   cinacalcet 30 MG tablet Commonly known as:  SENSIPAR Take 60 mg by mouth daily.   HYDROcodone-acetaminophen 5-325 MG tablet Commonly known as:  NORCO/VICODIN Take 2 tablets by mouth every 4 (four) hours as needed.   insulin aspart protamine- aspart  (70-30) 100 UNIT/ML injection Commonly known as:  NOVOLOG MIX 70/30 Inject 6-10 Units into the skin 2 (two) times daily with a meal. Take 10 units in the morning and 6 units in the evening   methocarbamol 500 MG tablet Commonly known as:  ROBAXIN Take 1 tablet (500 mg total) by mouth 2 (two) times daily.   metoCLOPramide 5 MG tablet Commonly known as:  REGLAN Take 5 mg by mouth 3 (three) times daily before meals.   midodrine 10 MG tablet Commonly known as:  PROAMATINE Take 10 mg by mouth 3 (three) times daily.   NOVOLIN 70/30 (70-30) 100 UNIT/ML injection Generic drug:  insulin NPH-regular Human Inject 10-12 Units into the skin 2 (two) times daily.   sevelamer carbonate 800 MG tablet Commonly known as:  RENVELA Take 2,400 mg by mouth 3 (three) times daily with meals.   ticagrelor 90 MG Tabs tablet Commonly known as:  BRILINTA Take 1 tablet (90 mg total) by mouth 2 (two) times daily.      Follow-up Information    Billee Cashing, MD. Schedule an appointment as soon as possible for a visit in 1 week(s).   Specialty:  Family Medicine Contact information: [address] [place] [address] [phone]          Allergies  Allergen Reactions  . Percocet [Oxycodone-Acetaminophen] Other (See Comments)    GI UPSET  . Flagyl [Metronidazole] Other (See Comments)  . Soap Itching    IVORY SOAP  . Tramadol Itching    Consultations:  Nephrologist   Procedures/Studies: Dg Chest Portable 1 View  Result Date: 05/14/2016 CLINICAL DATA:  Weakness, nausea and vomiting for 2 days. EXAM: PORTABLE CHEST 1 VIEW COMPARISON:  09/08/2015 FINDINGS: Unchanged moderate cardiomegaly. The lungs are clear. The pulmonary vasculature is normal. There is no large effusion. IMPRESSION: Stable cardiomegaly.  No consolidation or effusion. Electronically Signed   By: Ellery Plunk M.D.   On: 05/14/2016 22:13      Subjective: The patient was seen and examined at bedside. Patient  denied any. She reported she slept well last night. Denied fever, chills, headache, dizziness, nausea, vomiting, chest pain, shortness of breath, cough or abdominal pain. Reported tolerating diet well. The patient's daughter at bedside.   Discharge Exam: Vitals:   05/16/16 0507 05/16/16 0808  BP: 101/82 97/80  Pulse: 81 79  Resp: 17 18  Temp:  97.9 F (36.6 C)   Vitals:   05/15/16 1720 05/15/16 2023 05/16/16 0507 05/16/16 0808  BP: (!) 83/67 (!) 85/63 101/82 97/80  Pulse: 77 74 81 79  Resp: 17 17 17 18   Temp: 97.3 F (36.3 C) 97.5 F (36.4 C)  97.9 F (36.6 C)  TempSrc: Oral Oral  Oral  SpO2: 100% 100% 96% 98%  Weight:  79.9 kg (176 lb 2.4 oz)    Height:        General: Pt is alert, awake, not in acute distress Cardiovascular: RRR, S1/S2 +, no rubs, no gallops Respiratory: CTA bilaterally, no wheezing, no rhonchi Abdominal: Soft, NT, ND, bowel sounds + Extremities: b/l AKA    The results of significant diagnostics from this hospitalization (including imaging, microbiology,  ancillary and laboratory) are listed below for reference.     Microbiology: Recent Results (from the past 240 hour(s))  Culture, blood (routine x 2)     Status: None (Preliminary result)   Collection Time: 05/15/16 12:05 AM  Result Value Ref Range Status   Specimen Description BLOOD RIGHT HAND  Final   Special Requests BOTTLES DRAWN AEROBIC AND ANAEROBIC 5ML  Final   Culture NO GROWTH < 24 HOURS  Final   Report Status PENDING  Incomplete  Culture, blood (routine x 2)     Status: None (Preliminary result)   Collection Time: 05/15/16 12:20 AM  Result Value Ref Range Status   Specimen Description BLOOD LEFT HAND  Final   Special Requests IN PEDIATRIC BOTTLE 1ML  Final   Culture NO GROWTH < 24 HOURS  Final   Report Status PENDING  Incomplete  MRSA PCR Screening     Status: None   Collection Time: 05/15/16  2:09 AM  Result Value Ref Range Status   MRSA by PCR NEGATIVE NEGATIVE Final    Comment:         The GeneXpert MRSA Assay (FDA approved for NASAL specimens only), is one component of a comprehensive MRSA colonization surveillance program. It is not intended to diagnose MRSA infection nor to guide or monitor treatment for MRSA infections.      Labs: BNP (last 3 results) No results for input(s): BNP in the last 8760 hours. Basic Metabolic Panel:  Recent Labs Lab 05/12/16 1958 05/14/16 1716 05/15/16 0344 05/16/16 0319  NA 133* 134* 135 136  K 3.7 4.0 3.8 4.0  CL 90* 90* 91* 93*  CO2 29 21* 22 21*  GLUCOSE 134* 289* 253* 178*  BUN 24* 26* 32* 19  CREATININE 6.85* 5.80* 6.04* 4.58*  CALCIUM 10.3 8.6* 8.5* 9.1  PHOS  --   --  7.4*  --    Liver Function Tests:  Recent Labs Lab 05/12/16 1958 05/15/16 0344  AST 25  --   ALT 12*  --   ALKPHOS 128*  --   BILITOT 0.5  --   PROT 8.1  --   ALBUMIN 3.8 3.7    Recent Labs Lab 05/12/16 1958  LIPASE 29   No results for input(s): AMMONIA in the last 168 hours. CBC:  Recent Labs Lab 05/12/16 1958 05/14/16 1716 05/15/16 0344 05/16/16 0319  WBC 9.1 12.8* 11.5* 13.6*  HGB 11.8* 12.0 12.1 12.6  HCT 36.1 36.4 35.9* 37.9  MCV 85.7 86.7 85.3 86.3  PLT 270 193 205 211   Cardiac Enzymes: No results for input(s): CKTOTAL, CKMB, CKMBINDEX, TROPONINI in the last 168 hours. BNP: Invalid input(s): POCBNP CBG:  Recent Labs Lab 05/15/16 0804 05/15/16 1309 05/15/16 1718 05/15/16 2022 05/16/16 0807  GLUCAP 269* 101* 147* 174* 284*   D-Dimer No results for input(s): DDIMER in the last 72 hours. Hgb A1c No results for input(s): HGBA1C in the last 72 hours. Lipid Profile No results for input(s): CHOL, HDL, LDLCALC, TRIG, CHOLHDL, LDLDIRECT in the last 72 hours. Thyroid function studies No results for input(s): TSH, T4TOTAL, T3FREE, THYROIDAB in the last 72 hours.  Invalid input(s): FREET3 Anemia work up No results for input(s): VITAMINB12, FOLATE, FERRITIN, TIBC, IRON, RETICCTPCT in the last 72  hours. Urinalysis    Component Value Date/Time   COLORURINE YELLOW 06/20/2008 0827   APPEARANCEUR TURBID (A) 06/20/2008 0827   LABSPEC 1.025 06/20/2008 0827   PHURINE 7.5 06/20/2008 0827   GLUCOSEU NEGATIVE 06/20/2008 0827  HGBUR LARGE (A) 06/20/2008 0827   BILIRUBINUR SMALL (A) 06/20/2008 0827   KETONESUR 15 (A) 06/20/2008 0827   PROTEINUR >300 (A) 06/20/2008 0827   UROBILINOGEN 0.2 06/20/2008 0827   NITRITE NEGATIVE 06/20/2008 0827   LEUKOCYTESUR LARGE (A) 06/20/2008 0827   Sepsis Labs Invalid input(s): PROCALCITONIN,  WBC,  LACTICIDVEN Microbiology Recent Results (from the past 240 hour(s))  Culture, blood (routine x 2)     Status: None (Preliminary result)   Collection Time: 05/15/16 12:05 AM  Result Value Ref Range Status   Specimen Description BLOOD RIGHT HAND  Final   Special Requests BOTTLES DRAWN AEROBIC AND ANAEROBIC  Final   Culture NO GROWTH < 24 HOURS  Final   Report Status PENDING  Incomplete  Culture, blood (routine x 2)     Status: None (Preliminary result)   Collection Time: 05/15/16 12:20 AM  Result Value Ref Range Status   Specimen Description BLOOD LEFT HAND  Final   Special Requests IN PEDIATRIC BOTTLE  Final   Culture NO GROWTH < 24 HOURS  Final   Report Status PENDING  Incomplete  MRSA PCR Screening     Status: None   Collection Time: 05/15/16  2:09 AM  Result Value Ref Range Status   MRSA by PCR NEGATIVE NEGATIVE Final    Comment:        The GeneXpert MRSA Assay (FDA approved for NASAL specimens only), is one component of a comprehensive MRSA colonization surveillance program. It is not intended to diagnose MRSA infection nor to guide or monitor treatment for MRSA infections.      Time coordinating discharge: 28 minutes  SIGNED:   Maxie Barb, MD  [facility] 05/16/2016, 11:06 AM Pager   If 7PM-7AM, please contact night-coverage [email or web address] Password [email or web address]

## 2016-05-17 ENCOUNTER — Emergency Department (HOSPITAL_COMMUNITY): Payer: Medicare Other

## 2016-05-17 ENCOUNTER — Inpatient Hospital Stay (HOSPITAL_COMMUNITY)
Admission: EM | Admit: 2016-05-17 | Discharge: 2016-05-25 | DRG: 280 | Disposition: A | Discharge status: Hospice | Payer: Medicare Other | Attending: Cardiovascular Disease | Admitting: Cardiovascular Disease

## 2016-05-17 ENCOUNTER — Encounter (HOSPITAL_COMMUNITY): Payer: Self-pay | Admitting: Emergency Medicine

## 2016-05-17 DIAGNOSIS — Z89612 Acquired absence of left leg above knee: Secondary | ICD-10-CM

## 2016-05-17 DIAGNOSIS — I25118 Atherosclerotic heart disease of native coronary artery with other forms of angina pectoris: Secondary | ICD-10-CM | POA: Diagnosis present

## 2016-05-17 DIAGNOSIS — E8889 Other specified metabolic disorders: Secondary | ICD-10-CM | POA: Diagnosis present

## 2016-05-17 DIAGNOSIS — E1151 Type 2 diabetes mellitus with diabetic peripheral angiopathy without gangrene: Secondary | ICD-10-CM | POA: Diagnosis present

## 2016-05-17 DIAGNOSIS — E118 Type 2 diabetes mellitus with unspecified complications: Secondary | ICD-10-CM

## 2016-05-17 DIAGNOSIS — I513 Intracardiac thrombosis, not elsewhere classified: Secondary | ICD-10-CM

## 2016-05-17 DIAGNOSIS — N2581 Secondary hyperparathyroidism of renal origin: Secondary | ICD-10-CM | POA: Diagnosis present

## 2016-05-17 DIAGNOSIS — I429 Cardiomyopathy, unspecified: Secondary | ICD-10-CM | POA: Diagnosis present

## 2016-05-17 DIAGNOSIS — I132 Hypertensive heart and chronic kidney disease with heart failure and with stage 5 chronic kidney disease, or end stage renal disease: Secondary | ICD-10-CM | POA: Diagnosis present

## 2016-05-17 DIAGNOSIS — Z7901 Long term (current) use of anticoagulants: Secondary | ICD-10-CM

## 2016-05-17 DIAGNOSIS — Z7189 Other specified counseling: Secondary | ICD-10-CM

## 2016-05-17 DIAGNOSIS — E785 Hyperlipidemia, unspecified: Secondary | ICD-10-CM | POA: Diagnosis present

## 2016-05-17 DIAGNOSIS — R570 Cardiogenic shock: Secondary | ICD-10-CM

## 2016-05-17 DIAGNOSIS — I2510 Atherosclerotic heart disease of native coronary artery without angina pectoris: Secondary | ICD-10-CM

## 2016-05-17 DIAGNOSIS — Z79899 Other long term (current) drug therapy: Secondary | ICD-10-CM

## 2016-05-17 DIAGNOSIS — Z9861 Coronary angioplasty status: Secondary | ICD-10-CM

## 2016-05-17 DIAGNOSIS — Z955 Presence of coronary angioplasty implant and graft: Secondary | ICD-10-CM

## 2016-05-17 DIAGNOSIS — IMO0001 Reserved for inherently not codable concepts without codable children: Secondary | ICD-10-CM

## 2016-05-17 DIAGNOSIS — Z794 Long term (current) use of insulin: Secondary | ICD-10-CM

## 2016-05-17 DIAGNOSIS — Z452 Encounter for adjustment and management of vascular access device: Secondary | ICD-10-CM

## 2016-05-17 DIAGNOSIS — I214 Non-ST elevation (NSTEMI) myocardial infarction: Principal | ICD-10-CM | POA: Diagnosis present

## 2016-05-17 DIAGNOSIS — Z7982 Long term (current) use of aspirin: Secondary | ICD-10-CM

## 2016-05-17 DIAGNOSIS — I10 Essential (primary) hypertension: Secondary | ICD-10-CM | POA: Diagnosis present

## 2016-05-17 DIAGNOSIS — Z89611 Acquired absence of right leg above knee: Secondary | ICD-10-CM

## 2016-05-17 DIAGNOSIS — I959 Hypotension, unspecified: Secondary | ICD-10-CM | POA: Diagnosis present

## 2016-05-17 DIAGNOSIS — Z993 Dependence on wheelchair: Secondary | ICD-10-CM

## 2016-05-17 DIAGNOSIS — E1122 Type 2 diabetes mellitus with diabetic chronic kidney disease: Secondary | ICD-10-CM | POA: Diagnosis present

## 2016-05-17 DIAGNOSIS — D649 Anemia, unspecified: Secondary | ICD-10-CM | POA: Diagnosis present

## 2016-05-17 DIAGNOSIS — I953 Hypotension of hemodialysis: Secondary | ICD-10-CM | POA: Diagnosis present

## 2016-05-17 DIAGNOSIS — K219 Gastro-esophageal reflux disease without esophagitis: Secondary | ICD-10-CM | POA: Diagnosis present

## 2016-05-17 DIAGNOSIS — N186 End stage renal disease: Secondary | ICD-10-CM

## 2016-05-17 DIAGNOSIS — I5023 Acute on chronic systolic (congestive) heart failure: Secondary | ICD-10-CM | POA: Diagnosis present

## 2016-05-17 DIAGNOSIS — Z515 Encounter for palliative care: Secondary | ICD-10-CM

## 2016-05-17 DIAGNOSIS — Z66 Do not resuscitate: Secondary | ICD-10-CM | POA: Diagnosis not present

## 2016-05-17 DIAGNOSIS — E860 Dehydration: Secondary | ICD-10-CM | POA: Diagnosis present

## 2016-05-17 DIAGNOSIS — Z992 Dependence on renal dialysis: Secondary | ICD-10-CM

## 2016-05-17 NOTE — ED Provider Notes (Signed)
[facility]-EMERGENCY DEPT Provider Note   CSN: [id number] Arrival date & time: 05/17/16  2217  By signing my name below, I, [name], attest that this documentation has been prepared under the direction and in the presence of Gilda Crease, MD. Electronically Signed: Rosario Wang, ED Scribe. 05/17/16. 11:33 PM.  History   Chief Complaint Chief Complaint  Patient presents with  . Back Pain  . Nasal Congestion   The history is provided by the patient and the EMS personnel. No language interpreter was used.   HPI Comments: [name] is a 45 y.o. female BIB EMS, who presents to the Emergency Department complaining of constant loss of appetite onset several days ago. Pt reports associated fatigue and diffuse weakness secondary to her loss of appetite. She also notes that she has back pain at baseline, which has not acutely changed recently. Pt was seen in the ED ~6 days ago for her back pain and was d/c at that time with an rx of Methocarbamol. Pt was seen again ~1 day later where she was admitted for hyperglycemia and hypotension. Pt was d/c from admission two days later. She has been taking Norco and Methocarbamol with moderate relief of her back pain. Pt has also been seen by her PCP for her back pain, and has been rx'd Hydrocodone with moderate relief of her back pain as well. She is scheduled to have her next dialysis appointment tomorrow, and her last appointment was ~3 days ago. Pt does not make any urine at baseline. Pt has not been able to sleep well since the onset of her symptoms. Denies emesis, abdominal pain, or any other associated sympyoms.  Past Medical History:  Diagnosis Date  . Coronary artery disease 01/2015   single vessel disease CFX, stented 02/01/2015  . Diabetes mellitus    IDDM  . ESRF (end stage renal failure) (HCC)    dialysis M,W, F; left thigh AV graft  . GERD (gastroesophageal reflux disease)   . High cholesterol   . History of gangrene     left foot  . Hx MRSA infection   . Hypertension    states has been on med. x "a long time"  . Impaired mobility    bilateral AKA, is in a wheelchair  . 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 Myocardial infarction (HCC)    " slight heart attack on 01/31/15  . 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 S/P bilateral above knee amputation [facility])     Patient Active Problem List   Diagnosis Date Noted  . Prolonged QT interval 05/15/2016  . AKI (acute kidney injury) (HCC) 05/15/2016  . Hypotension, unspecified 05/14/2016  . Renal cyst 09/11/2015  . Essential hypertension 03/06/2015  . Chest pain 02/05/2015  . Elevated troponin   . CAD S/P CFX DES 02/01/15   . PVD (peripheral vascular disease) (HCC)   . Nausea with vomiting   . Acute pulmonary edema (HCC)   . Hemoptysis   . NSTEMI (non-ST elevated myocardial infarction) (HCC)   . HCAP (healthcare-associated pneumonia) 04/20/2013  . Hyperglycemia 06/23/2012  . MRSA infection 06/07/2012  . 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 Arteriovenous graft infection (HCC) 04/28/2012  . Bacteremia 04/25/2012  . Leucocytosis 04/24/2012  . ESRD on dialysis 04/24/2012  . 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 Anemia 04/24/2012    Past Surgical History:  Procedure Laterality Date  .  ABOVE KNEE LEG AMPUTATION  11/08/2009   right  . ABOVE KNEE LEG AMPUTATION  05/21/2009   left  . AV FISTULA PLACEMENT  10/18/2008   right thigh AV graft  . AV FISTULA PLACEMENT  07/18/2004   creation left AV fistula, radial to cephalic  . AV FISTULA REPAIR  07/08/2008   removal left upper arm AV graft  . AV FISTULA REPAIR  07/14/2005   removal infected right forearm AV graft  . CARDIAC CATHETERIZATION N/A 02/01/2015   Procedure: Left Heart Cath and Coronary Angiography;  Surgeon: Lennette Bihari, MD;  Location: [facility] INVASIVE CV LAB;  Service: Cardiovascular;  Laterality: N/A;  . CARDIAC  CATHETERIZATION N/A 02/01/2015   Procedure: Coronary Stent Intervention;  Surgeon: Lennette Bihari, MD;  Location: [facility] INVASIVE CV LAB;  Service: Cardiovascular;  Laterality: N/A;  . CARPAL TUNNEL RELEASE  01/26/2012   Procedure: CARPAL TUNNEL RELEASE;  Surgeon: Nicki Reaper, MD;  Location: [facility];  Service: Orthopedics;  Laterality: Left;  . CENTRAL VENOUS CATHETER INSERTION  08/06/2009   removal right IJ Diatek cath., placement left IJ Diatek cath.  . CENTRAL VENOUS CATHETER INSERTION  07/10/2008; 12/31/2006; 01/19/2006; 06/19/2005; 09/19/2004; 09/08/2004   right IJ Diatek catheter  . DIALYSIS FISTULA CREATION  01/20/2007   left upper arm AV graft  . DIALYSIS FISTULA CREATION  01/28/2006   right upper arm AV graft  . DIALYSIS FISTULA CREATION  10/04/2005   left forearm AV graft  . DIALYSIS FISTULA CREATION  09/10/2004   right forearm AV graft  . ESOPHAGOGASTRODUODENOSCOPY  06/26/2012   Procedure: ESOPHAGOGASTRODUODENOSCOPY (EGD);  Surgeon: Charna Elizabeth, MD;  Location: [facility] ENDOSCOPY;  Service: Endoscopy;  Laterality: N/A;  . EXCISION / CURETTAGE BONE CYST TALUS / CALCANEUS  03/04/2009   partial calcaneal exc. left; placement wound VAC  . FEMORAL ENDARTERECTOMY  11/06/2008   right common femoral; embolectomy right femoral artery; removal right femoral AV graft  . FINGER AMPUTATION  05/27/2010   right middle  . FINGER DEBRIDEMENT  06/20/2010   right middle finger  . FINGER EXPLORATION  07/13/2002   and repair left middle finger  . FOOT AMPUTATION  04/04/2009   left transtibial amputation  . GROIN DEBRIDEMENT  11/16/2008   right rectus femoris muscle flap to right groin wound; right groing debridement  . LEG AMPUTATION BELOW KNEE  10/11/2009   right  . REVISION OF ARTERIOVENOUS GORETEX GRAFT Left 10/01/2015   Procedure: REVISION OF ARTERIOVENOUS GORETEX GRAFT-LEFT THIGH;  Surgeon: Sherren Kerns, MD;  Location: [facility] OR;  Service: Vascular;  Laterality: Left;  . SHUNTOGRAM N/A 11/12/2011    Procedure: Betsey Amen;  Surgeon: Fransisco Hertz, MD;  Location: [facility] CATH LAB;  Service: Cardiovascular;  Laterality: N/A;  . THROMBECTOMY / ARTERIOVENOUS GRAFT REVISION  01/14/2010   thrombectomy left superficial femoral artery; left thigh AV graft placement  . THROMBECTOMY / ARTERIOVENOUS GRAFT REVISION  05/11/2007;12/31/2006; 01/18/2006; 12/16/2005   left upper arm  . THROMBECTOMY / ARTERIOVENOUS GRAFT REVISION  10/26/2005   left forearm AV graft  . THROMBECTOMY / ARTERIOVENOUS GRAFT REVISION  03/11/2005; 10/20/2004   right forearm AV graft  . THROMBECTOMY AND REVISION OF ARTERIOVENTOUS (AV) GORETEX  GRAFT Left 04/24/2012   thigh; replacement of venous limb    OB History    No data available       Home Medications    Prior to Admission medications   Medication Sig Start Date End Date Taking? Authorizing Provider  aspirin 81 MG chewable  tablet Chew 81 mg by mouth daily.   Yes Historical Provider, MD  atorvastatin (LIPITOR) 40 MG tablet Take 1 tablet (40 mg total) by mouth daily at 6 PM. 02/02/15  Yes Calvert Cantor, MD  cetirizine (ZYRTEC) 10 MG tablet Take 10 mg by mouth daily as needed for allergies.  01/13/16  Yes Historical Provider, MD  cinacalcet (SENSIPAR) 30 MG tablet Take 60 mg by mouth daily.   Yes Historical Provider, MD  HYDROcodone-acetaminophen (NORCO/VICODIN) 5-325 MG tablet Take 2 tablets by mouth every 4 (four) hours as needed. Patient taking differently: Take 2 tablets by mouth every 4 (four) hours as needed for moderate pain.  05/12/16  Yes Jerelyn Scott, MD  insulin aspart protamine-insulin aspart (NOVOLOG 70/30) (70-30) 100 UNIT/ML injection Inject 6-10 Units into the skin 2 (two) times daily with a meal. Take 10 units in the morning and 6 units in the evening   Yes Historical Provider, MD  methocarbamol (ROBAXIN) 500 MG tablet Take 1 tablet (500 mg total) by mouth 2 (two) times daily. 05/12/16  Yes Jerelyn Scott, MD  metoCLOPramide (REGLAN) 5 MG tablet Take 5 mg by mouth 3 (three)  times daily before meals.   Yes Historical Provider, MD  metoprolol (LOPRESSOR) 50 MG tablet Take 50 mg by mouth daily.    Yes Historical Provider, MD  midodrine (PROAMATINE) 10 MG tablet Take 10 mg by mouth 3 (three) times daily.   Yes Historical Provider, MD  NOVOLIN 70/30 (70-30) 100 UNIT/ML injection Inject 10-12 Units into the skin 2 (two) times daily. 04/07/16  Yes Historical Provider, MD  sevelamer (RENVELA) 800 MG tablet Take 2,400 mg by mouth 3 (three) times daily with meals.    Yes Historical Provider, MD  ticagrelor (BRILINTA) 90 MG TABS tablet Take 1 tablet (90 mg total) by mouth 2 (two) times daily. 02/02/15  Yes Calvert Cantor, MD    Family History Family History  Problem Relation Age of Onset  . Diabetes Mother   . Heart disease Mother   . Hypertension Mother   . Heart attack Mother 72  . Diabetes Sister   . Hypertension Brother   . Heart attack Brother     Social History Social History  Substance Use Topics  . Smoking status: Never Smoker  . Smokeless tobacco: Never Used  . Alcohol use No   Allergies   Percocet [oxycodone-acetaminophen]; Flagyl [metronidazole]; Soap; and Tramadol  Review of Systems Review of Systems  Constitutional: Positive for appetite change.  Gastrointestinal: Negative for nausea and vomiting.  Musculoskeletal: Positive for back pain.  Neurological: Positive for weakness.  All other systems reviewed and are negative.  Physical Exam Updated Vital Signs BP 96/69   Pulse 88   Temp 98 F (36.7 C)   Resp 18   LMP 05/14/2005 Comment: no period since dialysis in 2006  SpO2 100%   Physical Exam  Constitutional: She is oriented to person, place, and time. She appears well-developed and well-nourished. No distress.  HENT:  Head: Normocephalic and atraumatic.  Right Ear: Hearing normal.  Left Ear: Hearing normal.  Nose: Nose normal.  Mouth/Throat: Oropharynx is clear and moist and mucous membranes are normal.  Eyes: Conjunctivae and EOM are  normal. Pupils are equal, round, and reactive to light.  Neck: Normal range of motion. Neck supple.  Cardiovascular: Regular rhythm, S1 normal and S2 normal.  Exam reveals no gallop and no friction rub.   No murmur heard. Pulmonary/Chest: Effort normal and breath sounds normal. No respiratory distress. She  exhibits no tenderness.  Abdominal: Soft. Normal appearance and bowel sounds are normal. There is no hepatosplenomegaly. There is no tenderness. There is no rebound, no guarding, no tenderness at McBurney's point and negative Murphy's sign. No hernia.  Musculoskeletal: Normal range of motion. She exhibits tenderness.  Thoracic spine tenderness throughout.   Neurological: She is alert and oriented to person, place, and time. She has normal strength. No cranial nerve deficit or sensory deficit. Coordination normal. GCS eye subscore is 4. GCS verbal subscore is 5. GCS motor subscore is 6.  Skin: Skin is warm, dry and intact. No rash noted. No cyanosis.  Psychiatric: She has a normal mood and affect. Her speech is normal and behavior is normal. Thought content normal.  Nursing note and vitals reviewed.  ED Treatments / Results  DIAGNOSTIC STUDIES: Oxygen Saturation is 100% on RA, normal by my interpretation.   COORDINATION OF CARE: 11:31 PM-Discussed next steps with pt. Pt verbalized understanding and is agreeable with the plan.   Labs (all labs ordered are listed, but only abnormal results are displayed) Labs Reviewed  CBC WITH DIFFERENTIAL/PLATELET - Abnormal; Notable for the following:       Result Value   WBC 10.6 (*)    Hemoglobin 11.6 (*)    HCT 34.2 (*)    RDW 18.5 (*)    All other components within normal limits  COMPREHENSIVE METABOLIC PANEL - Abnormal; Notable for the following:    Sodium 134 (*)    Chloride 96 (*)    Glucose, Bld 118 (*)    BUN 37 (*)    Creatinine, Ser 7.76 (*)    Calcium 7.4 (*)    Albumin 3.4 (*)    AST 46 (*)    Alkaline Phosphatase 189 (*)     Total Bilirubin 1.4 (*)    GFR calc non Af Amer 6 (*)    GFR calc Af Amer 6 (*)    Anion gap 16 (*)    All other components within normal limits  I-STAT TROPOININ, ED - Abnormal; Notable for the following:    Troponin i, poc 4.31 (*)    All other components within normal limits  LIPASE, BLOOD    EKG  EKG Interpretation None       Radiology Dg Abd Acute W/chest  Result Date: 05/18/2016 CLINICAL DATA:  Acute onset of upper back pain and vomiting. Initial encounter. EXAM: DG ABDOMEN ACUTE W/ 1V CHEST COMPARISON:  Chest radiograph performed 05/14/2016, and CT of the abdomen and pelvis from 09/08/2015 FINDINGS: The lungs are well-aerated. Minimal bibasilar atelectasis is noted. There is no evidence of pleural effusion or pneumothorax. The cardiomediastinal silhouette is enlarged. The visualized bowel gas pattern is unremarkable. Scattered stool and air are seen within the colon; there is no evidence of small bowel dilatation to suggest obstruction. No free intra-abdominal air is identified on the provided upright view. No acute osseous abnormalities are seen; the sacroiliac joints are unremarkable in appearance. Small calcified fibroids are noted overlying the pelvis. Scattered vascular calcifications are seen. IMPRESSION: 1. Unremarkable bowel gas pattern; no free intra-abdominal air seen. Small to moderate amount of stool noted in the colon. 2. Minimal bibasilar atelectasis noted.  Cardiomegaly. 3. Small calcified uterine fibroids again noted. 4. Scattered vascular calcifications seen. Electronically Signed   By: Roanna RaiderJeffery  Chang M.D.   On: 05/18/2016 01:08    Procedures Procedures (including critical care time)  Medications Ordered in ED Medications  aspirin chewable tablet 324 mg (not administered)  Initial Impression / Assessment and Plan / ED Course  I have reviewed the triage vital signs and the nursing notes.  Pertinent labs & imaging results that were available during my care  of the patient were reviewed by me and considered in my medical decision making (see chart for details).  Clinical Course   Patient presents to the emergency department for evaluation of generalized malaise, nausea, poor oral intake. Patient was hospitalized this week for similar symptoms. She was found to be hypotensive during hospitalization, this was felt to be secondary to dialysis today. She also has some chronic issues with hypotension and is on midodrine.   EKG performed at arrival did have nonspecific ST changes in inferior and lateral leads, this is unchanged from EKG performed the other day, but does appear different from other previous EKGs we have had for years prior. A troponin was performed and is markedly elevated. Reviewing her records reveals that in June 2016 she had a similar presentation and was found to have and an NSTEMI, cardiac catheterization showed 99% circumflex lesion that was treated with drug-eluting stent.  She is not currently experiencing chest pain. This was discussed with Dr. Donnie Aho, on call for cardiology. He recommends aspirin and heparinization, admission to medicine service with cardiology consult in a.m.  CRITICAL CARE Performed by: Gilda Crease   Total critical care time: 30 minutes  Critical care time was exclusive of separately billable procedures and treating other patients.  Critical care was necessary to treat or prevent imminent or life-threatening deterioration.  Critical care was time spent personally by me on the following activities: development of treatment plan with patient and/or surrogate as well as nursing, discussions with consultants, evaluation of patient's response to treatment, examination of patient, obtaining history from patient or surrogate, ordering and performing treatments and interventions, ordering and review of laboratory studies, ordering and review of radiographic studies, pulse oximetry and re-evaluation of  patient's condition.   Final Clinical Impressions(s) / ED Diagnoses   Final diagnoses:  NSTEMI (non-ST elevated myocardial infarction) [facility])    New Prescriptions New Prescriptions   No medications on file   I personally performed the services described in this documentation, which was scribed in my presence. The recorded information has been reviewed and is accurate.     Gilda Crease, MD 05/18/16 [phone]

## 2016-05-17 NOTE — ED Triage Notes (Signed)
Pt came via EMS. C/o of runny nose and lower, middle back pain for 3 weeks. Pt evaluated here for same. Given Norco and no relief

## 2016-05-17 NOTE — ED Notes (Signed)
Patient transported to X-ray 

## 2016-05-18 ENCOUNTER — Encounter (HOSPITAL_COMMUNITY): Payer: Self-pay | Admitting: Family Medicine

## 2016-05-18 DIAGNOSIS — I429 Cardiomyopathy, unspecified: Secondary | ICD-10-CM | POA: Diagnosis present

## 2016-05-18 DIAGNOSIS — D638 Anemia in other chronic diseases classified elsewhere: Secondary | ICD-10-CM | POA: Diagnosis not present

## 2016-05-18 DIAGNOSIS — N186 End stage renal disease: Secondary | ICD-10-CM

## 2016-05-18 DIAGNOSIS — I25118 Atherosclerotic heart disease of native coronary artery with other forms of angina pectoris: Secondary | ICD-10-CM | POA: Diagnosis present

## 2016-05-18 DIAGNOSIS — Z515 Encounter for palliative care: Secondary | ICD-10-CM | POA: Diagnosis not present

## 2016-05-18 DIAGNOSIS — Z789 Other specified health status: Secondary | ICD-10-CM | POA: Diagnosis not present

## 2016-05-18 DIAGNOSIS — R570 Cardiogenic shock: Secondary | ICD-10-CM | POA: Diagnosis present

## 2016-05-18 DIAGNOSIS — I953 Hypotension of hemodialysis: Secondary | ICD-10-CM | POA: Diagnosis present

## 2016-05-18 DIAGNOSIS — I959 Hypotension, unspecified: Secondary | ICD-10-CM

## 2016-05-18 DIAGNOSIS — Z7982 Long term (current) use of aspirin: Secondary | ICD-10-CM | POA: Diagnosis not present

## 2016-05-18 DIAGNOSIS — E118 Type 2 diabetes mellitus with unspecified complications: Secondary | ICD-10-CM

## 2016-05-18 DIAGNOSIS — I213 ST elevation (STEMI) myocardial infarction of unspecified site: Secondary | ICD-10-CM | POA: Diagnosis not present

## 2016-05-18 DIAGNOSIS — Z89612 Acquired absence of left leg above knee: Secondary | ICD-10-CM | POA: Diagnosis not present

## 2016-05-18 DIAGNOSIS — D649 Anemia, unspecified: Secondary | ICD-10-CM | POA: Diagnosis present

## 2016-05-18 DIAGNOSIS — Z9861 Coronary angioplasty status: Secondary | ICD-10-CM | POA: Diagnosis not present

## 2016-05-18 DIAGNOSIS — Z794 Long term (current) use of insulin: Secondary | ICD-10-CM | POA: Diagnosis not present

## 2016-05-18 DIAGNOSIS — E1122 Type 2 diabetes mellitus with diabetic chronic kidney disease: Secondary | ICD-10-CM | POA: Diagnosis present

## 2016-05-18 DIAGNOSIS — I2510 Atherosclerotic heart disease of native coronary artery without angina pectoris: Secondary | ICD-10-CM | POA: Diagnosis not present

## 2016-05-18 DIAGNOSIS — R079 Chest pain, unspecified: Secondary | ICD-10-CM | POA: Diagnosis not present

## 2016-05-18 DIAGNOSIS — E860 Dehydration: Secondary | ICD-10-CM | POA: Diagnosis present

## 2016-05-18 DIAGNOSIS — I10 Essential (primary) hypertension: Secondary | ICD-10-CM

## 2016-05-18 DIAGNOSIS — E8889 Other specified metabolic disorders: Secondary | ICD-10-CM | POA: Diagnosis present

## 2016-05-18 DIAGNOSIS — Z79899 Other long term (current) drug therapy: Secondary | ICD-10-CM | POA: Diagnosis not present

## 2016-05-18 DIAGNOSIS — Z89611 Acquired absence of right leg above knee: Secondary | ICD-10-CM | POA: Diagnosis not present

## 2016-05-18 DIAGNOSIS — Z992 Dependence on renal dialysis: Secondary | ICD-10-CM | POA: Diagnosis not present

## 2016-05-18 DIAGNOSIS — Z993 Dependence on wheelchair: Secondary | ICD-10-CM | POA: Diagnosis not present

## 2016-05-18 DIAGNOSIS — E1151 Type 2 diabetes mellitus with diabetic peripheral angiopathy without gangrene: Secondary | ICD-10-CM | POA: Diagnosis present

## 2016-05-18 DIAGNOSIS — I214 Non-ST elevation (NSTEMI) myocardial infarction: Secondary | ICD-10-CM | POA: Diagnosis present

## 2016-05-18 DIAGNOSIS — I5023 Acute on chronic systolic (congestive) heart failure: Secondary | ICD-10-CM | POA: Diagnosis present

## 2016-05-18 DIAGNOSIS — E785 Hyperlipidemia, unspecified: Secondary | ICD-10-CM | POA: Diagnosis present

## 2016-05-18 DIAGNOSIS — K219 Gastro-esophageal reflux disease without esophagitis: Secondary | ICD-10-CM | POA: Diagnosis present

## 2016-05-18 DIAGNOSIS — I132 Hypertensive heart and chronic kidney disease with heart failure and with stage 5 chronic kidney disease, or end stage renal disease: Secondary | ICD-10-CM | POA: Diagnosis present

## 2016-05-18 DIAGNOSIS — N2581 Secondary hyperparathyroidism of renal origin: Secondary | ICD-10-CM | POA: Diagnosis present

## 2016-05-18 DIAGNOSIS — Z955 Presence of coronary angioplasty implant and graft: Secondary | ICD-10-CM | POA: Diagnosis not present

## 2016-05-18 LAB — COMPREHENSIVE METABOLIC PANEL
ALK PHOS: 189 U/L — AB (ref 38–126)
ALT: 29 U/L (ref 14–54)
ANION GAP: 16 — AB (ref 5–15)
AST: 46 U/L — ABNORMAL HIGH (ref 15–41)
Albumin: 3.4 g/dL — ABNORMAL LOW (ref 3.5–5.0)
BILIRUBIN TOTAL: 1.4 mg/dL — AB (ref 0.3–1.2)
BUN: 37 mg/dL — ABNORMAL HIGH (ref 6–20)
CALCIUM: 7.4 mg/dL — AB (ref 8.9–10.3)
CO2: 22 mmol/L (ref 22–32)
Chloride: 96 mmol/L — ABNORMAL LOW (ref 101–111)
Creatinine, Ser: 7.76 mg/dL — ABNORMAL HIGH (ref 0.44–1.00)
GFR, EST AFRICAN AMERICAN: 6 mL/min — AB (ref >60)
GFR, EST NON AFRICAN AMERICAN: 6 mL/min — AB (ref >60)
GLUCOSE: 118 mg/dL — AB (ref 65–99)
POTASSIUM: 3.7 mmol/L (ref 3.5–5.1)
Sodium: 134 mmol/L — ABNORMAL LOW (ref 135–145)
Total Protein: 7.3 g/dL (ref 6.5–8.1)

## 2016-05-18 LAB — CBC WITH DIFFERENTIAL/PLATELET
BASOS PCT: 0 %
Basophils Absolute: 0 10*3/uL (ref 0.0–0.1)
Eosinophils Absolute: 0 10*3/uL (ref 0.0–0.7)
Eosinophils Relative: 0 %
HEMATOCRIT: 34.2 % — AB (ref 36.0–46.0)
HEMOGLOBIN: 11.6 g/dL — AB (ref 12.0–15.0)
LYMPHS ABS: 3 10*3/uL (ref 0.7–4.0)
Lymphocytes Relative: 28 %
MCH: 28.8 pg (ref 26.0–34.0)
MCHC: 33.9 g/dL (ref 30.0–36.0)
MCV: 84.9 fL (ref 78.0–100.0)
MONO ABS: 0.9 10*3/uL (ref 0.1–1.0)
MONOS PCT: 9 %
NEUTROS ABS: 6.6 10*3/uL (ref 1.7–7.7)
NEUTROS PCT: 63 %
Platelets: 224 10*3/uL (ref 150–400)
RBC: 4.03 MIL/uL (ref 3.87–5.11)
RDW: 18.5 % — AB (ref 11.5–15.5)
WBC: 10.6 10*3/uL — ABNORMAL HIGH (ref 4.0–10.5)

## 2016-05-18 LAB — I-STAT TROPONIN, ED: TROPONIN I, POC: 4.31 ng/mL — AB (ref 0.00–0.08)

## 2016-05-18 LAB — CBC
HEMATOCRIT: 34.2 % — AB (ref 36.0–46.0)
HEMOGLOBIN: 11.6 g/dL — AB (ref 12.0–15.0)
MCH: 28.8 pg (ref 26.0–34.0)
MCHC: 33.9 g/dL (ref 30.0–36.0)
MCV: 84.9 fL (ref 78.0–100.0)
Platelets: 222 10*3/uL (ref 150–400)
RBC: 4.03 MIL/uL (ref 3.87–5.11)
RDW: 18.6 % — AB (ref 11.5–15.5)
WBC: 8.9 10*3/uL (ref 4.0–10.5)

## 2016-05-18 LAB — BASIC METABOLIC PANEL
ANION GAP: 19 — AB (ref 5–15)
BUN: 41 mg/dL — AB (ref 6–20)
CHLORIDE: 95 mmol/L — AB (ref 101–111)
CO2: 20 mmol/L — ABNORMAL LOW (ref 22–32)
Calcium: 7.5 mg/dL — ABNORMAL LOW (ref 8.9–10.3)
Creatinine, Ser: 8.08 mg/dL — ABNORMAL HIGH (ref 0.44–1.00)
GFR calc Af Amer: 6 mL/min — ABNORMAL LOW (ref >60)
GFR, EST NON AFRICAN AMERICAN: 5 mL/min — AB (ref >60)
GLUCOSE: 91 mg/dL (ref 65–99)
POTASSIUM: 5 mmol/L (ref 3.5–5.1)
SODIUM: 134 mmol/L — AB (ref 135–145)

## 2016-05-18 LAB — TROPONIN I
Troponin I: 3.38 ng/mL (ref <0.03)
Troponin I: 2.76 ng/mL (ref <0.03)

## 2016-05-18 LAB — HEPARIN LEVEL (UNFRACTIONATED)
HEPARIN UNFRACTIONATED: 0.59 [IU]/mL (ref 0.30–0.70)
HEPARIN UNFRACTIONATED: 0.54 [IU]/mL (ref 0.30–0.70)

## 2016-05-18 LAB — GLUCOSE, CAPILLARY
GLUCOSE-CAPILLARY: 176 mg/dL — AB (ref 65–99)
GLUCOSE-CAPILLARY: 79 mg/dL (ref 65–99)

## 2016-05-18 LAB — CBG MONITORING, ED
GLUCOSE-CAPILLARY: 75 mg/dL (ref 65–99)
Glucose-Capillary: 93 mg/dL (ref 65–99)

## 2016-05-18 LAB — LIPASE, BLOOD: LIPASE: 29 U/L (ref 11–51)

## 2016-05-18 MED ORDER — ATORVASTATIN CALCIUM 40 MG PO TABS
40.0000 mg | ORAL_TABLET | Freq: Every day | ORAL | Status: DC
  Administered 2016-05-19 – 2016-05-24 (×6): 40 mg via ORAL
  Filled 2016-05-18 (×6): qty 1

## 2016-05-18 MED ORDER — METOCLOPRAMIDE HCL 10 MG PO TABS
5.0000 mg | ORAL_TABLET | Freq: Three times a day (TID) | ORAL | Status: DC
  Administered 2016-05-18 – 2016-05-24 (×15): 5 mg via ORAL
  Filled 2016-05-18 (×15): qty 1

## 2016-05-18 MED ORDER — ONDANSETRON HCL 4 MG/2ML IJ SOLN
4.0000 mg | Freq: Four times a day (QID) | INTRAMUSCULAR | Status: DC | PRN
  Administered 2016-05-18 – 2016-05-23 (×10): 4 mg via INTRAVENOUS
  Filled 2016-05-18 (×11): qty 2

## 2016-05-18 MED ORDER — INSULIN ASPART 100 UNIT/ML ~~LOC~~ SOLN: 0.0000 [IU] | SUBCUTANEOUS | Status: DC

## 2016-05-18 MED ORDER — HEPARIN SODIUM (PORCINE) 1000 UNIT/ML DIALYSIS: 6000.0000 [IU] | Freq: Once | INTRAMUSCULAR | Status: DC

## 2016-05-18 MED ORDER — PENTAFLUOROPROP-TETRAFLUOROETH EX AERO: 1.0000 "application " | INHALATION_SPRAY | CUTANEOUS | Status: DC | PRN

## 2016-05-18 MED ORDER — NITROGLYCERIN 0.4 MG SL SUBL: 0.4000 mg | SUBLINGUAL_TABLET | SUBLINGUAL | Status: DC | PRN

## 2016-05-18 MED ORDER — SODIUM CHLORIDE 0.9 % IV SOLN: 100.0000 mL | INTRAVENOUS | Status: DC | PRN

## 2016-05-18 MED ORDER — ALTEPLASE 2 MG IJ SOLR: 2.0000 mg | Freq: Once | INTRAMUSCULAR | Status: DC | PRN

## 2016-05-18 MED ORDER — METHOCARBAMOL 500 MG PO TABS
500.0000 mg | ORAL_TABLET | Freq: Two times a day (BID) | ORAL | Status: DC
  Administered 2016-05-18 – 2016-05-22 (×10): 500 mg via ORAL
  Filled 2016-05-18 (×12): qty 1

## 2016-05-18 MED ORDER — HEPARIN SODIUM (PORCINE) 1000 UNIT/ML DIALYSIS: 1000.0000 [IU] | INTRAMUSCULAR | Status: DC | PRN

## 2016-05-18 MED ORDER — CINACALCET HCL 30 MG PO TABS: 60.0000 mg | ORAL_TABLET | Freq: Every day | ORAL | Status: DC

## 2016-05-18 MED ORDER — CALCITRIOL 0.5 MCG PO CAPS
ORAL_CAPSULE | ORAL | Status: AC
  Filled 2016-05-18: qty 3

## 2016-05-18 MED ORDER — LIDOCAINE-PRILOCAINE 2.5-2.5 % EX CREA: 1.0000 "application " | TOPICAL_CREAM | CUTANEOUS | Status: DC | PRN

## 2016-05-18 MED ORDER — CALCITRIOL 0.25 MCG PO CAPS
1.5000 ug | ORAL_CAPSULE | ORAL | Status: DC
  Administered 2016-05-18 – 2016-05-20 (×2): 1.5 ug via ORAL
  Filled 2016-05-18: qty 6

## 2016-05-18 MED ORDER — INSULIN ASPART 100 UNIT/ML ~~LOC~~ SOLN
0.0000 [IU] | Freq: Three times a day (TID) | SUBCUTANEOUS | Status: DC
  Administered 2016-05-19: 3 [IU] via SUBCUTANEOUS
  Administered 2016-05-19 – 2016-05-20 (×2): 2 [IU] via SUBCUTANEOUS
  Administered 2016-05-20: 1 [IU] via SUBCUTANEOUS
  Administered 2016-05-20: 2 [IU] via SUBCUTANEOUS

## 2016-05-18 MED ORDER — HEPARIN (PORCINE) IN NACL 100-0.45 UNIT/ML-% IJ SOLN
900.0000 [IU]/h | INTRAMUSCULAR | Status: DC
  Administered 2016-05-18 – 2016-05-20 (×5): 1000 [IU]/h via INTRAVENOUS
  Administered 2016-05-21 – 2016-05-23 (×3): 900 [IU]/h via INTRAVENOUS
  Filled 2016-05-18 (×7): qty 250

## 2016-05-18 MED ORDER — ASPIRIN 81 MG PO CHEW
81.0000 mg | CHEWABLE_TABLET | Freq: Every day | ORAL | Status: DC
  Administered 2016-05-18 – 2016-05-24 (×7): 81 mg via ORAL
  Filled 2016-05-18 (×8): qty 1

## 2016-05-18 MED ORDER — METOPROLOL TARTRATE 50 MG PO TABS
50.0000 mg | ORAL_TABLET | Freq: Every day | ORAL | Status: DC
  Administered 2016-05-19: 50 mg via ORAL
  Filled 2016-05-18: qty 1

## 2016-05-18 MED ORDER — SEVELAMER CARBONATE 800 MG PO TABS
2400.0000 mg | ORAL_TABLET | Freq: Three times a day (TID) | ORAL | Status: DC
  Administered 2016-05-19 – 2016-05-20 (×2): 2400 mg via ORAL
  Filled 2016-05-18 (×10): qty 3

## 2016-05-18 MED ORDER — TICAGRELOR 90 MG PO TABS
90.0000 mg | ORAL_TABLET | Freq: Two times a day (BID) | ORAL | Status: DC
  Administered 2016-05-18 – 2016-05-20 (×6): 90 mg via ORAL
  Filled 2016-05-18 (×6): qty 1

## 2016-05-18 MED ORDER — ASPIRIN 81 MG PO CHEW
324.0000 mg | CHEWABLE_TABLET | Freq: Once | ORAL | Status: AC
  Administered 2016-05-18: 324 mg via ORAL
  Filled 2016-05-18: qty 4

## 2016-05-18 MED ORDER — RENA-VITE PO TABS
1.0000 | ORAL_TABLET | Freq: Every day | ORAL | Status: DC
  Administered 2016-05-18 – 2016-05-24 (×7): 1 via ORAL
  Filled 2016-05-18 (×6): qty 1

## 2016-05-18 MED ORDER — HEPARIN BOLUS VIA INFUSION
4000.0000 [IU] | Freq: Once | INTRAVENOUS | Status: AC
  Administered 2016-05-18: 4000 [IU] via INTRAVENOUS
  Filled 2016-05-18: qty 4000

## 2016-05-18 MED ORDER — LOPERAMIDE HCL 2 MG PO CAPS
4.0000 mg | ORAL_CAPSULE | Freq: Once | ORAL | Status: AC
  Administered 2016-05-18: 4 mg via ORAL
  Filled 2016-05-18: qty 2

## 2016-05-18 MED ORDER — CINACALCET HCL 30 MG PO TABS
60.0000 mg | ORAL_TABLET | Freq: Two times a day (BID) | ORAL | Status: DC
  Administered 2016-05-19 – 2016-05-22 (×5): 60 mg via ORAL
  Filled 2016-05-18 (×5): qty 2

## 2016-05-18 MED ORDER — LIDOCAINE HCL (PF) 1 % IJ SOLN: 5.0000 mL | INTRAMUSCULAR | Status: DC | PRN

## 2016-05-18 MED ORDER — HYDROCODONE-ACETAMINOPHEN 5-325 MG PO TABS
2.0000 | ORAL_TABLET | ORAL | Status: DC | PRN
  Administered 2016-05-18 – 2016-05-25 (×8): 2 via ORAL
  Filled 2016-05-18 (×8): qty 2

## 2016-05-18 NOTE — ED Notes (Signed)
Pt's CBG 93.  Informed Chrislyn, RN.

## 2016-05-18 NOTE — ED Notes (Signed)
Pt given lotion. 

## 2016-05-18 NOTE — Progress Notes (Signed)
ANTICOAGULATION CONSULT NOTE - Initial Consult  Pharmacy Consult for Heparin Indication: chest pain/ACS  Allergies  Allergen Reactions  . Percocet [Oxycodone-Acetaminophen] Other (See Comments)    GI UPSET  . Flagyl [Metronidazole] Other (See Comments)  . Soap Itching    IVORY SOAP  . Tramadol Itching    Patient Measurements:   Heparin Dosing Weight: 70 kg  Vital Signs: Temp: 98 F (36.7 C) (09/17 2228) BP: 96/69 (09/17 2245) Pulse Rate: 88 (09/17 2245)  Labs:  Recent Labs  05/15/16 0344 05/16/16 0319 05/18/16 0022  HGB 12.1 12.6 11.6*  HCT 35.9* 37.9 34.2*  PLT 205 211 224  CREATININE 6.04* 4.58* 7.76*    Estimated Creatinine Clearance: 9.1 mL/min (by C-G formula based on SCr of 7.76 mg/dL (H)).   Medical History: Past Medical History:  Diagnosis Date  . Coronary artery disease 01/2015   single vessel disease CFX, stented 02/01/2015  . Diabetes mellitus    IDDM  . ESRF (end stage renal failure) (HCC)    dialysis M,W, F; left thigh AV graft  . GERD (gastroesophageal reflux disease)   . High cholesterol   . History of gangrene    left foot  . Hx MRSA infection   . Hypertension    states has been on med. x "a long time"  . Impaired mobility    bilateral AKA, is in a wheelchair  . 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 Myocardial infarction (HCC)    " slight heart attack on 01/31/15  . 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 S/P bilateral above knee amputation (HCC)     Medications:  No current facility-administered medications on file prior to encounter.    Current Outpatient Prescriptions on File Prior to Encounter  Medication Sig Dispense Refill  . aspirin 81 MG chewable tablet Chew 81 mg by mouth daily.    Marland Kitchen. atorvastatin (LIPITOR) 40 MG tablet Take 1 tablet (40 mg total) by mouth daily at 6 PM. 30 tablet 0  . cetirizine (ZYRTEC) 10 MG tablet Take 10 mg by mouth daily as needed for allergies.   11  . cinacalcet (SENSIPAR) 30  MG tablet Take 60 mg by mouth daily.    Marland Kitchen. HYDROcodone-acetaminophen (NORCO/VICODIN) 5-325 MG tablet Take 2 tablets by mouth every 4 (four) hours as needed. (Patient taking differently: Take 2 tablets by mouth every 4 (four) hours as needed for moderate pain. ) 20 tablet 0  . insulin aspart protamine-insulin aspart (NOVOLOG 70/30) (70-30) 100 UNIT/ML injection Inject 6-10 Units into the skin 2 (two) times daily with a meal. Take 10 units in the morning and 6 units in the evening    . methocarbamol (ROBAXIN) 500 MG tablet Take 1 tablet (500 mg total) by mouth 2 (two) times daily. 20 tablet 0  . metoCLOPramide (REGLAN) 5 MG tablet Take 5 mg by mouth 3 (three) times daily before meals.    . midodrine (PROAMATINE) 10 MG tablet Take 10 mg by mouth 3 (three) times daily.    Marland Kitchen. NOVOLIN 70/30 (70-30) 100 UNIT/ML injection Inject 10-12 Units into the skin 2 (two) times daily.    . sevelamer (RENVELA) 800 MG tablet Take 2,400 mg by mouth 3 (three) times daily with meals.     . ticagrelor (BRILINTA) 90 MG TABS tablet Take 1 tablet (90 mg total) by mouth 2 (two) times daily. 60 tablet 0     Assessment: 45 y.o. female with elevated  cardiac markers for heparin  Goal of Therapy:  Heparin level 0.3-0.7 units/ml Monitor platelets by anticoagulation protocol: Yes   Plan:  Heparin 4000 units IV bolus, then start heparin 1000 units/hr Check heparin level in 8 hours.   Eddie Candle 05/18/2016,2:14 AM

## 2016-05-18 NOTE — ED Notes (Signed)
Critical Lab Troponin 3.38 MD made aware.

## 2016-05-18 NOTE — Consult Note (Addendum)
Cardiology Consultation     Patient ID: [name], [id number], March 10, 1971 Admit date: 05/17/2016 Date of Consult: 05/18/2016  Primary Physician: Dr. Alyson Ingles Primary Cardiologist: Dr. Corky Downs Referring Physician: Dr. Waldron Labs  Chief Complaint: positive troponin Reason for Consultation:  NSTEMI  HPI: Ms. [name] is a 45 -year-old female who has a long-standing history of diabetes mellitus, peripheral vascular disease and is status post bilateral above-the-knee remote amputations, and has end-stage renal disease on dialysis.  In June 2016 she was omitted with nausea vomiting and increasing dyspnea with positive troponins.  Cardiac catheterization revealed 30-40% proximal circumflex stenosis followed by 99% proximal to mid stenosis prior to sharp and in a vessel.  She underwent PCI to the subtotal 99% stenosis with insertion of 2.515 mm Xience Alpine DES stent postdilated 2.71 mm.  A subsequent echocardiography revealed normal ejection fraction.  She had been doing fairly well until last week when last Wednesday she experienced nausea, vomiting and diaphoresis.  She was admitted briefly in observation status and underwent dialysis on Friday and was discharged on September 16.  She re-presented yesterday with increasing fatigue without further nausea and vomiting.  Her symptoms were reminiscent of symptoms from June 2016 when she had suffered her non-ST segment MI.  She was admitted.  Troponin was elevated at 4.31.  She has undergone dialysis today.  She denies any dialysis induced chest pain.  Cardiology consultation was requested in light of her positive cardiac markers.  PMHx:   Past Medical History:  Diagnosis Date  . Coronary artery disease 01/2015   single vessel disease CFX, stented 02/01/2015  . Diabetes mellitus    IDDM  . ESRF (end stage renal failure) (HCC)    dialysis M,W, F; left thigh AV graft  . GERD (gastroesophageal reflux disease)   . High cholesterol   . History of  gangrene    left foot  . Hx MRSA infection   . Hypertension    states has been on med. x "a long time"  . Impaired mobility    bilateral AKA, is in a wheelchair  . 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 Myocardial infarction ([place])    " slight heart attack on 01/31/15  . 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 S/P bilateral above knee amputation [facility])    Past Surgical History:  Procedure Laterality Date  . ABOVE KNEE LEG AMPUTATION  11/08/2009   right  . ABOVE KNEE LEG AMPUTATION  05/21/2009   left  . AV FISTULA PLACEMENT  10/18/2008   right thigh AV graft  . AV FISTULA PLACEMENT  07/18/2004   creation left AV fistula, radial to cephalic  . AV FISTULA REPAIR  07/08/2008   removal left upper arm AV graft  . AV FISTULA REPAIR  07/14/2005   removal infected right forearm AV graft  . CARDIAC CATHETERIZATION N/A 02/01/2015   Procedure: Left Heart Cath and Coronary Angiography;  Surgeon: Troy Sine, MD;  Location: [place] CV LAB;  Service: Cardiovascular;  Laterality: N/A;  . CARDIAC CATHETERIZATION N/A 02/01/2015   Procedure: Coronary Stent Intervention;  Surgeon: Troy Sine, MD;  Location: [place] CV LAB;  Service: Cardiovascular;  Laterality: N/A;  . CARPAL TUNNEL RELEASE  01/26/2012   Procedure: CARPAL TUNNEL RELEASE;  Surgeon: Wynonia Sours, MD;  Location: [place];  Service: Orthopedics;  Laterality: Left;  . CENTRAL VENOUS CATHETER INSERTION  08/06/2009   removal right  IJ Diatek cath., placement left IJ Diatek cath.  . CENTRAL VENOUS CATHETER INSERTION  07/10/2008; 12/31/2006; 01/19/2006; 06/19/2005; 09/19/2004; 09/08/2004   right IJ Diatek catheter  . DIALYSIS FISTULA CREATION  01/20/2007   left upper arm AV graft  . DIALYSIS FISTULA CREATION  01/28/2006   right upper arm AV graft  . DIALYSIS FISTULA CREATION  10/04/2005   left forearm AV graft  . DIALYSIS FISTULA CREATION  09/10/2004   right forearm AV graft  . ESOPHAGOGASTRODUODENOSCOPY   06/26/2012   Procedure: ESOPHAGOGASTRODUODENOSCOPY (EGD);  Surgeon: Juanita Craver, MD;  Location: [facility] ENDOSCOPY;  Service: Endoscopy;  Laterality: N/A;  . EXCISION / CURETTAGE BONE CYST TALUS / CALCANEUS  03/04/2009   partial calcaneal exc. left; placement wound VAC  . FEMORAL ENDARTERECTOMY  11/06/2008   right common femoral; embolectomy right femoral artery; removal right femoral AV graft  . FINGER AMPUTATION  05/27/2010   right middle  . FINGER DEBRIDEMENT  06/20/2010   right middle finger  . FINGER EXPLORATION  07/13/2002   and repair left middle finger  . FOOT AMPUTATION  04/04/2009   left transtibial amputation  . GROIN DEBRIDEMENT  11/16/2008   right rectus femoris muscle flap to right groin wound; right groing debridement  . LEG AMPUTATION BELOW KNEE  10/11/2009   right  . REVISION OF ARTERIOVENOUS GORETEX GRAFT Left 10/01/2015   Procedure: REVISION OF ARTERIOVENOUS GORETEX GRAFT-LEFT THIGH;  Surgeon: Elam Dutch, MD;  Location: [place];  Service: Vascular;  Laterality: Left;  . SHUNTOGRAM N/A 11/12/2011   Procedure: Earney Mallet;  Surgeon: Conrad Jersey, MD;  Location[facility] CATH LAB;  Service: Cardiovascular;  Laterality: N/A;  . THROMBECTOMY / ARTERIOVENOUS GRAFT REVISION  01/14/2010   thrombectomy left superficial femoral artery; left thigh AV graft placement  . THROMBECTOMY / ARTERIOVENOUS GRAFT REVISION  05/11/2007;12/31/2006; 01/18/2006; 12/16/2005   left upper arm  . THROMBECTOMY / ARTERIOVENOUS GRAFT REVISION  10/26/2005   left forearm AV graft  . THROMBECTOMY / ARTERIOVENOUS GRAFT REVISION  03/11/2005; 10/20/2004   right forearm AV graft  . THROMBECTOMY AND REVISION OF ARTERIOVENTOUS (AV) GORETEX  GRAFT Left 04/24/2012   thigh; replacement of venous limb    FAMHx: Family History  Problem Relation Age of Onset  . Diabetes Mother   . Heart disease Mother   . Hypertension Mother   . Heart attack Mother 65  . Diabetes Sister   . Hypertension Brother   . Heart attack Brother      SOCHx:  reports that she has never smoked. She has never used smokeless tobacco. She reports that she does not drink alcohol or use drugs.  ALLERGIES: Allergies  Allergen Reactions  . Percocet [Oxycodone-Acetaminophen] Other (See Comments)    GI UPSET  . Flagyl [Metronidazole] Other (See Comments)  . Soap Itching    IVORY SOAP  . Tramadol Itching     HOME MEDICATIONS: Prescriptions Prior to Admission  Medication Sig Dispense Refill Last Dose  . aspirin 81 MG chewable tablet Chew 81 mg by mouth daily.   05/17/2016 at Unknown time  . atorvastatin (LIPITOR) 40 MG tablet Take 1 tablet (40 mg total) by mouth daily at 6 PM. 30 tablet 0 05/16/2016  . cetirizine (ZYRTEC) 10 MG tablet Take 10 mg by mouth daily as needed for allergies.   11 unk  . cinacalcet (SENSIPAR) 30 MG tablet Take 60 mg by mouth daily.   05/17/2016 at Unknown time  . HYDROcodone-acetaminophen (NORCO/VICODIN) 5-325 MG tablet Take 2 tablets by mouth  every 4 (four) hours as needed. (Patient taking differently: Take 2 tablets by mouth every 4 (four) hours as needed for moderate pain. ) 20 tablet 0 unk  . insulin aspart protamine-insulin aspart (NOVOLOG 70/30) (70-30) 100 UNIT/ML injection Inject 6-10 Units into the skin 2 (two) times daily with a meal. Take 10 units in the morning and 6 units in the evening   05/17/2016 at am  . methocarbamol (ROBAXIN) 500 MG tablet Take 1 tablet (500 mg total) by mouth 2 (two) times daily. 20 tablet 0 05/17/2016 at am  . metoCLOPramide (REGLAN) 5 MG tablet Take 5 mg by mouth 3 (three) times daily before meals.   05/17/2016 at am  . metoprolol (LOPRESSOR) 50 MG tablet Take 50 mg by mouth daily.    05/17/2016 at 1200  . midodrine (PROAMATINE) 10 MG tablet Take 10 mg by mouth 3 (three) times daily.   05/17/2016 at am  . NOVOLIN 70/30 (70-30) 100 UNIT/ML injection Inject 10-12 Units into the skin 2 (two) times daily.   05/17/2016 at am  . sevelamer (RENVELA) 800 MG tablet Take 2,400 mg by mouth 3  (three) times daily with meals.    05/17/2016 at am  . ticagrelor (BRILINTA) 90 MG TABS tablet Take 1 tablet (90 mg total) by mouth 2 (two) times daily. 60 tablet 0 05/17/2016 at am  . amLODipine (NORVASC) 10 MG tablet Take 10 mg by mouth daily.       HOSPITAL MEDICATIONS: . aspirin  81 mg Oral Daily  . atorvastatin  40 mg Oral q1800  . calcitRIOL  1.5 mcg Oral Q M,W,F-HD  . cinacalcet  60 mg Oral BID WC  . [START ON 05/19/2016] insulin aspart  0-9 Units Subcutaneous TID WC  . methocarbamol  500 mg Oral BID  . metoCLOPramide  5 mg Oral TID AC  . metoprolol  50 mg Oral Daily  . multivitamin  1 tablet Oral QHS  . sevelamer carbonate  2,400 mg Oral TID WC  . ticagrelor  90 mg Oral BID    ROS General: Negative; No fevers, chills, or night sweats; Has it for fatigue. HEENT: Negative; No changes in vision or hearing, sinus congestion, difficulty swallowing Pulmonary: Negative; No cough, wheezing, shortness of breath, hemoptysis Cardiovascular: Negative; No chest pain, presyncope, syncope, palpitations GI: Positive for nausea and vomiting last week GU: Negative; No dysuria, hematuria, or difficulty voiding Musculoskeletal: Negative; no myalgias, joint pain, or weakness Hematologic/Oncology: Negative; no easy bruising, bleeding Endocrine: Negative; no heat/cold intolerance; no diabetes Neuro: Negative; no changes in balance, headaches Skin: Negative; No rashes or skin lesions Psychiatric: Negative; No behavioral problems, depression Sleep: Negative; No snoring, daytime sleepiness, hypersomnolence, bruxism, restless legs, hypnogognic hallucinations, no cataplexy Other comprehensive 14 point system review is negative.    VITALS: Blood pressure 97/70, pulse 86, temperature 97.5 F (36.4 C), temperature source Oral, resp. rate 18, weight 163 lb 2.3 oz (74 kg), last menstrual period 05/14/2005, SpO2 92 %.  PHYSICAL EXAM: General: Alert, oriented, no distress.  Skin: normal turgor, no  rashes, warm and dry HEENT: Normocephalic, atraumatic. Pupils equal round and reactive to light; sclera anicteric; extraocular muscles intact, No lid lag; Nose without nasal septal hypertrophy; Mouth/Parynx benign; Mallinpatti scale 3 Neck: No JVD, no carotid bruits; normal carotid upstroke Lungs: clear to ausculatation and percussion bilaterally; no wheezing or rales, normal inspiratory and expiratory effort Chest wall: without tenderness to palpitation Heart: PMI not displaced, RRR, s1 s2 normal, 1/6 systolic murmur, No diastolic murmur, no  rubs, gallops, thrills, or heaves Abdomen: obese; soft, nontender; no hepatosplenomehaly, BS+; abdominal aorta nontender and not dilated by palpation. Back: no  Flank tenderness Pulses: 2+  Extremities: Bilateral above-the-knee amputations Neurologic: grossly nonfocal; Cranial nerves grossly wnl Psychologic: Normal mood and affect  ECG (independently read by me): NSR at 76; NSST changes; QTc 457 msec  LABS: Results for orders placed or performed during the hospital encounter of 05/17/16 (from the past 48 hour(s))  CBC with Differential/Platelet     Status: Abnormal   Collection Time: 05/18/16 12:22 AM  Result Value Ref Range   WBC 10.6 (H) 4.0 - 10.5 K/uL   RBC 4.03 3.87 - 5.11 MIL/uL   Hemoglobin 11.6 (L) 12.0 - 15.0 g/dL   HCT 34.2 (L) 36.0 - 46.0 %   MCV 84.9 78.0 - 100.0 fL   MCH 28.8 26.0 - 34.0 pg   MCHC 33.9 30.0 - 36.0 g/dL   RDW 18.5 (H) 11.5 - 15.5 %   Platelets 224 150 - 400 K/uL   Neutrophils Relative % 63 %   Neutro Abs 6.6 1.7 - 7.7 K/uL   Lymphocytes Relative 28 %   Lymphs Abs 3.0 0.7 - 4.0 K/uL   Monocytes Relative 9 %   Monocytes Absolute 0.9 0.1 - 1.0 K/uL   Eosinophils Relative 0 %   Eosinophils Absolute 0.0 0.0 - 0.7 K/uL   Basophils Relative 0 %   Basophils Absolute 0.0 0.0 - 0.1 K/uL  Comprehensive metabolic panel     Status: Abnormal   Collection Time: 05/18/16 12:22 AM  Result Value Ref Range   Sodium 134 (L)  135 - 145 mmol/L   Potassium 3.7 3.5 - 5.1 mmol/L   Chloride 96 (L) 101 - 111 mmol/L   CO2 22 22 - 32 mmol/L   Glucose, Bld 118 (H) 65 - 99 mg/dL   BUN 37 (H) 6 - 20 mg/dL   Creatinine, Ser 7.76 (H) 0.44 - 1.00 mg/dL    Comment: DELTA CHECK NOTED   Calcium 7.4 (L) 8.9 - 10.3 mg/dL   Total Protein 7.3 6.5 - 8.1 g/dL   Albumin 3.4 (L) 3.5 - 5.0 g/dL   AST 46 (H) 15 - 41 U/L   ALT 29 14 - 54 U/L   Alkaline Phosphatase 189 (H) 38 - 126 U/L   Total Bilirubin 1.4 (H) 0.3 - 1.2 mg/dL   GFR calc non Af Amer 6 (L) >60 mL/min   GFR calc Af Amer 6 (L) >60 mL/min    Comment: (NOTE) The eGFR has been calculated using the CKD EPI equation. This calculation has not been validated in all clinical situations. eGFR's persistently <60 mL/min signify possible Chronic Kidney Disease.    Anion gap 16 (H) 5 - 15  Lipase, blood     Status: None   Collection Time: 05/18/16 12:22 AM  Result Value Ref Range   Lipase 29 11 - 51 U/L  I-stat troponin, ED     Status: Abnormal   Collection Time: 05/18/16  1:04 AM  Result Value Ref Range   Troponin i, poc 4.31 (HH) 0.00 - 0.08 ng/mL   Comment NOTIFIED PHYSICIAN    Comment 3            Comment: Due to the release kinetics of cTnI, a negative result within the first hours of the onset of symptoms does not rule out myocardial infarction with certainty. If myocardial infarction is still suspected, repeat the test at appropriate intervals.   Troponin  I     Status: Abnormal   Collection Time: 05/18/16  7:19 AM  Result Value Ref Range   Troponin I 3.38 (HH) <0.03 ng/mL    Comment: CRITICAL RESULT CALLED TO, READ BACK BY AND VERIFIED WITHVenora Maples [id number] [facility]   Basic metabolic panel     Status: Abnormal   Collection Time: 05/18/16  7:19 AM  Result Value Ref Range   Sodium 134 (L) 135 - 145 mmol/L   Potassium 5.0 3.5 - 5.1 mmol/L    Comment: SLIGHT HEMOLYSIS   Chloride 95 (L) 101 - 111 mmol/L   CO2 20 (L) 22 - 32 mmol/L   Glucose, Bld 91 65 -  99 mg/dL   BUN 41 (H) 6 - 20 mg/dL   Creatinine, Ser 8.08 (H) 0.44 - 1.00 mg/dL   Calcium 7.5 (L) 8.9 - 10.3 mg/dL   GFR calc non Af Amer 5 (L) >60 mL/min   GFR calc Af Amer 6 (L) >60 mL/min    Comment: (NOTE) The eGFR has been calculated using the CKD EPI equation. This calculation has not been validated in all clinical situations. eGFR's persistently <60 mL/min signify possible Chronic Kidney Disease.    Anion gap 19 (H) 5 - 15  CBC     Status: Abnormal   Collection Time: 05/18/16  7:19 AM  Result Value Ref Range   WBC 8.9 4.0 - 10.5 K/uL   RBC 4.03 3.87 - 5.11 MIL/uL   Hemoglobin 11.6 (L) 12.0 - 15.0 g/dL   HCT 34.2 (L) 36.0 - 46.0 %   MCV 84.9 78.0 - 100.0 fL   MCH 28.8 26.0 - 34.0 pg   MCHC 33.9 30.0 - 36.0 g/dL   RDW 18.6 (H) 11.5 - 15.5 %   Platelets 222 150 - 400 K/uL  CBG monitoring, ED     Status: None   Collection Time: 05/18/16  8:21 AM  Result Value Ref Range   Glucose-Capillary 93 65 - 99 mg/dL   Comment 1 Notify RN   Heparin level (unfractionated)     Status: None   Collection Time: 05/18/16 11:31 AM  Result Value Ref Range   Heparin Unfractionated 0.59 0.30 - 0.70 IU/mL    Comment:        IF HEPARIN RESULTS ARE BELOW EXPECTED VALUES, AND PATIENT DOSAGE HAS BEEN CONFIRMED, SUGGEST FOLLOW UP TESTING OF ANTITHROMBIN III LEVELS.   CBG monitoring, ED     Status: None   Collection Time: 05/18/16 12:07 PM  Result Value Ref Range   Glucose-Capillary 75 65 - 99 mg/dL   Comment 1 Notify RN   Glucose, capillary     Status: None   Collection Time: 05/18/16  5:11 PM  Result Value Ref Range   Glucose-Capillary 79 65 - 99 mg/dL   Lipid Panel     Component Value Date/Time   CHOL 121 (L) 09/10/2015 0902   TRIG 118 09/10/2015 0902   HDL 34 (L) 09/10/2015 0902   CHOLHDL 3.6 09/10/2015 0902   VLDL 24 09/10/2015 0902   LDLCALC 63 09/10/2015 0902   IMAGING: Dg Abd Acute W/chest  Result Date: 05/18/2016 CLINICAL DATA:  Acute onset of upper back pain and  vomiting. Initial encounter. EXAM: DG ABDOMEN ACUTE W/ 1V CHEST COMPARISON:  Chest radiograph performed 05/14/2016, and CT of the abdomen and pelvis from 09/08/2015 FINDINGS: The lungs are well-aerated. Minimal bibasilar atelectasis is noted. There is no evidence of pleural effusion or pneumothorax. The cardiomediastinal silhouette is enlarged.  The visualized bowel gas pattern is unremarkable. Scattered stool and air are seen within the colon; there is no evidence of small bowel dilatation to suggest obstruction. No free intra-abdominal air is identified on the provided upright view. No acute osseous abnormalities are seen; the sacroiliac joints are unremarkable in appearance. Small calcified fibroids are noted overlying the pelvis. Scattered vascular calcifications are seen. IMPRESSION: 1. Unremarkable bowel gas pattern; no free intra-abdominal air seen. Small to moderate amount of stool noted in the colon. 2. Minimal bibasilar atelectasis noted.  Cardiomegaly. 3. Small calcified uterine fibroids again noted. 4. Scattered vascular calcifications seen. Electronically Signed   By: Garald Balding M.D.   On: 05/18/2016 01:08    IMPRESSION:  1. NSTEMI:  may have occurred on September 13 when she presented with nausea, vomiting, and had similar complaints as her prior presentation in 2016. 2. CAD: His catheterization revealed single-vessel CAD, status post LCx PCI June 2016. 3. ESRD: on hemodialysis 4. IDDM: 5. Hyperlipidemia: on atorvastatin 40 mg; her last LDL in January 2017 was at target at 12.  The patient is pain-free.  She never experienced chest pain last year when she presented with a non-STEMI and had a symptom complex of nausea and vomiting.  Her ECG is nondiagnostic.  Troponins are positive.  She had been stable over the past year without recurrent symptomatology.  At this point, I have discussed definitive reevaluation with diagnostic cardiac catheterization with possible PCI if indicated.  She  has continued on dual antiplatelet therapy.  She is maintaining sinus rhythm.  She is on atorvastatin for lipid lowering therapy with target LDL less than 70.  I'm not certain if the schedule allows for her to have definitive cardiac catheterization tomorrow.  However, should give a clear liquid breakfast in the event this can be added on for later in the day.  If not, plan tentative cardiac catheterization on Wednesday with plans for  dialysis on Wednesday following the catheterization. The risks and benefits of a cardiac catheterization including, but not limited to, death, stroke, MI, kidney damage and bleeding were discussed with the patient who indicates understanding and agrees to proceed.   Troy Sine, MD, [facility] 05/18/2016 6:26 PM

## 2016-05-18 NOTE — Progress Notes (Signed)
ANTICOAGULATION CONSULT NOTE  Pharmacy Consult for Heparin Indication: chest pain/ACS  Allergies  Allergen Reactions  . Percocet [Oxycodone-Acetaminophen] Other (See Comments)    GI UPSET  . Flagyl [Metronidazole] Other (See Comments)  . Soap Itching    IVORY SOAP  . Tramadol Itching    Patient Measurements:   Heparin Dosing Weight: 70 kg  Vital Signs: Temp: 97.4 F (36.3 C) (09/18 0625) Temp Source: Oral (09/18 0625) BP: 101/80 (09/18 1200) Pulse Rate: 73 (09/18 1200)  Labs:  Recent Labs  05/16/16 0319 05/18/16 0022 05/18/16 0719 05/18/16 1131  HGB 12.6 11.6* 11.6*  --   HCT 37.9 34.2* 34.2*  --   PLT 211 224 222  --   HEPARINUNFRC  --   --   --  0.59  CREATININE 4.58* 7.76* 8.08*  --   TROPONINI  --   --  3.38*  --     Estimated Creatinine Clearance: 8.7 mL/min (by C-G formula based on SCr of 8.08 mg/dL (H)).   Assessment: 45 y.o. female started on IV heparin for r/o ACS. Troponin elevated. Initial heparin level is therapeutic. No bleeding or line issues noted.   Goal of Therapy:  Heparin level 0.3-0.7 units/ml Monitor platelets by anticoagulation protocol: Yes   Plan:  - Continue heparin gtt 1000 units/hr - Check an 1800 heparin level to confirm - Daily heparin level and CBC  [name], [name] 05/18/2016,12:57 PM

## 2016-05-18 NOTE — Progress Notes (Signed)
ANTICOAGULATION CONSULT NOTE  Pharmacy Consult for Heparin Indication: chest pain/ACS  Allergies  Allergen Reactions  . Percocet [Oxycodone-Acetaminophen] Other (See Comments)    GI UPSET  . Flagyl [Metronidazole] Other (See Comments)  . Soap Itching    IVORY SOAP  . Tramadol Itching    Patient Measurements: Weight: 163 lb 2.3 oz (74 kg) Heparin Dosing Weight: 70 kg  Vital Signs: Temp: 97.5 F (36.4 C) (09/18 1806) Temp Source: Oral (09/18 1806) BP: 97/70 (09/18 1806) Pulse Rate: 86 (09/18 1806)  Labs:  Recent Labs  05/16/16 0319 05/18/16 0022 05/18/16 0719 05/18/16 1131 05/18/16 1832  HGB 12.6 11.6* 11.6*  --   --   HCT 37.9 34.2* 34.2*  --   --   PLT 211 224 222  --   --   HEPARINUNFRC  --   --   --  0.59 0.54  CREATININE 4.58* 7.76* 8.08*  --   --   TROPONINI  --   --  3.38*  --  2.76*    Estimated Creatinine Clearance: 8.4 mL/min (by C-G formula based on SCr of 8.08 mg/dL (H)).   Assessment: 45 y.o. female started on IV heparin for r/o ACS. Troponin elevated. Both the initial and confirmatory heparin level are therapeutic . No bleeding or line issues noted.   Goal of Therapy:  Heparin level 0.3-0.7 units/ml Monitor platelets by anticoagulation protocol: Yes   Plan:  - Continue heparin gtt 1000 units/hr - Daily heparin level and CBC  Ruben Imony Teren Franckowiak, PharmD Clinical Pharmacist Pager: [phone] 05/18/2016 8:03 PM

## 2016-05-18 NOTE — H&P (Signed)
History and Physical  Patient Name: [name]     ONG:[id number]    DOB: 1971-07-27    DOA: 05/17/2016 PCP: Billee Cashing, MD   Patient coming from: Home  Chief Complaint: Nausea, decreased appetite  HPI: [name] is a 45 y.o. female with a past medical history significant for ESRD on HD MWF, bilateral AKA amputee, IDDM, CAD s/p DES in 2016, HTN, and hypotension on midodrine who presents with persistent nausea and fatigue.  The patient was in her usual state of health until about 5 days ago, when she had onset of nausea and vomiting, sweats, malaise and fatigue. She was evaluated in the ER, noted to be hypotensive, admitted for observation for presumed dehydration.  She was given gentle IV fluids, underwent dialysis in the hospital, and her antihypertensives were held. She had persistent lactic acidosis, but her nausea and vomiting resolved and she appeared clinically stable after 2 days, without evidence of infection, and so she was discharged home with presumed hypotension related to dialysis as well as nausea and vomiting presumed from uremia.  Since discharge, she has been home, but felt no improvement.  She is fatigued and her sister has to rouse her just to talk to her.  She has no energy, feels nausea and decreased appetite and so tonight she returned to the ER because she just felt no improvement.  ED course: -Afebrile, heart rate 70s, respirations normal, blood pressure 90s over 70s, pulse oximetry normal -Na 134, K 3.7, Cr 7.7 (due for dialysis tomorrow), bicarbonate normal, WBC 10.6K, Hgb 11.6 (stable) -The troponin was 4.3 -ECG showed sinus rhythm, nonspecific flattening of T wave ranges in inferior and lateral leads, with subtle ST depressions in inferior leads -The case was discussed with cardiology who recommended aspirin and heparinization -TRH were consulted for admission  Of note, the patient was admitted with NSTEMI in June 2016, with the same presentation  (nausea, decreased appetite, fatigue), found incidentally to have an elevated troponin, underwent LHC that showed a 99% left circumflex lesion which was stented with a DES by Dr. Tresa Endo.  More recently she had a nuclear perfusion study that was read as low risk in June 2017, also showed EF 40%.           ROS: Review of Systems  Constitutional: Positive for malaise/fatigue. Negative for chills and fever.  Respiratory: Negative for shortness of breath.   Cardiovascular: Negative for chest pain.  Gastrointestinal: Positive for nausea and vomiting.  Neurological: Positive for weakness (sleepiness). Negative for speech change, focal weakness and loss of consciousness.  All other systems reviewed and are negative.         Past Medical History:  Diagnosis Date  . Coronary artery disease 01/2015   single vessel disease CFX, stented 02/01/2015  . Diabetes mellitus    IDDM  . ESRF (end stage renal failure) (HCC)    dialysis M,W, F; left thigh AV graft  . GERD (gastroesophageal reflux disease)   . High cholesterol   . History of gangrene    left foot  . Hx MRSA infection   . Hypertension    states has been on med. x "a long time"  . Impaired mobility    bilateral AKA, is in a wheelchair  . 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 Myocardial infarction (HCC)    " slight heart attack on 01/31/15  . 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  S/P bilateral above knee amputation [facility])     Past Surgical History:  Procedure Laterality Date  . ABOVE KNEE LEG AMPUTATION  11/08/2009   right  . ABOVE KNEE LEG AMPUTATION  05/21/2009   left  . AV FISTULA PLACEMENT  10/18/2008   right thigh AV graft  . AV FISTULA PLACEMENT  07/18/2004   creation left AV fistula, radial to cephalic  . AV FISTULA REPAIR  07/08/2008   removal left upper arm AV graft  . AV FISTULA REPAIR  07/14/2005   removal infected right forearm AV graft  . CARDIAC CATHETERIZATION N/A 02/01/2015    Procedure: Left Heart Cath and Coronary Angiography;  Surgeon: Lennette Bihari, MD;  Location: [facility] INVASIVE CV LAB;  Service: Cardiovascular;  Laterality: N/A;  . CARDIAC CATHETERIZATION N/A 02/01/2015   Procedure: Coronary Stent Intervention;  Surgeon: Lennette Bihari, MD;  Location: [facility] INVASIVE CV LAB;  Service: Cardiovascular;  Laterality: N/A;  . CARPAL TUNNEL RELEASE  01/26/2012   Procedure: CARPAL TUNNEL RELEASE;  Surgeon: Nicki Reaper, MD;  Location: [facility];  Service: Orthopedics;  Laterality: Left;  . CENTRAL VENOUS CATHETER INSERTION  08/06/2009   removal right IJ Diatek cath., placement left IJ Diatek cath.  . CENTRAL VENOUS CATHETER INSERTION  07/10/2008; 12/31/2006; 01/19/2006; 06/19/2005; 09/19/2004; 09/08/2004   right IJ Diatek catheter  . DIALYSIS FISTULA CREATION  01/20/2007   left upper arm AV graft  . DIALYSIS FISTULA CREATION  01/28/2006   right upper arm AV graft  . DIALYSIS FISTULA CREATION  10/04/2005   left forearm AV graft  . DIALYSIS FISTULA CREATION  09/10/2004   right forearm AV graft  . ESOPHAGOGASTRODUODENOSCOPY  06/26/2012   Procedure: ESOPHAGOGASTRODUODENOSCOPY (EGD);  Surgeon: Charna Elizabeth, MD;  Location: [facility] ENDOSCOPY;  Service: Endoscopy;  Laterality: N/A;  . EXCISION / CURETTAGE BONE CYST TALUS / CALCANEUS  03/04/2009   partial calcaneal exc. left; placement wound VAC  . FEMORAL ENDARTERECTOMY  11/06/2008   right common femoral; embolectomy right femoral artery; removal right femoral AV graft  . FINGER AMPUTATION  05/27/2010   right middle  . FINGER DEBRIDEMENT  06/20/2010   right middle finger  . FINGER EXPLORATION  07/13/2002   and repair left middle finger  . FOOT AMPUTATION  04/04/2009   left transtibial amputation  . GROIN DEBRIDEMENT  11/16/2008   right rectus femoris muscle flap to right groin wound; right groing debridement  . LEG AMPUTATION BELOW KNEE  10/11/2009   right  . REVISION OF ARTERIOVENOUS GORETEX GRAFT Left 10/01/2015   Procedure:  REVISION OF ARTERIOVENOUS GORETEX GRAFT-LEFT THIGH;  Surgeon: Sherren Kerns, MD;  Location: [facility] OR;  Service: Vascular;  Laterality: Left;  . SHUNTOGRAM N/A 11/12/2011   Procedure: Betsey Amen;  Surgeon: Fransisco Hertz, MD;  Location: [facility] CATH LAB;  Service: Cardiovascular;  Laterality: N/A;  . THROMBECTOMY / ARTERIOVENOUS GRAFT REVISION  01/14/2010   thrombectomy left superficial femoral artery; left thigh AV graft placement  . THROMBECTOMY / ARTERIOVENOUS GRAFT REVISION  05/11/2007;12/31/2006; 01/18/2006; 12/16/2005   left upper arm  . THROMBECTOMY / ARTERIOVENOUS GRAFT REVISION  10/26/2005   left forearm AV graft  . THROMBECTOMY / ARTERIOVENOUS GRAFT REVISION  03/11/2005; 10/20/2004   right forearm AV graft  . THROMBECTOMY AND REVISION OF ARTERIOVENTOUS (AV) GORETEX  GRAFT Left 04/24/2012   thigh; replacement of venous limb    Social History: Patient lives with her sister.  The patient is wheelchair bound because of bilateral AKA.  She does  not smoke.    Allergies  Allergen Reactions  . Percocet [Oxycodone-Acetaminophen] Other (See Comments)    GI UPSET  . Flagyl [Metronidazole] Other (See Comments)  . Soap Itching    IVORY SOAP  . Tramadol Itching    Family history: family history includes Diabetes in her mother and sister; Heart attack in her brother; Heart attack (age of onset: 54) in her mother; Heart disease in her mother; Hypertension in her brother and mother.  Prior to Admission medications   Medication Sig Start Date End Date Taking? Authorizing Provider  aspirin 81 MG chewable tablet Chew 81 mg by mouth daily.   Yes Historical Provider, MD  atorvastatin (LIPITOR) 40 MG tablet Take 1 tablet (40 mg total) by mouth daily at 6 PM. 02/02/15  Yes Calvert Cantor, MD  cetirizine (ZYRTEC) 10 MG tablet Take 10 mg by mouth daily as needed for allergies.  01/13/16  Yes Historical Provider, MD  cinacalcet (SENSIPAR) 30 MG tablet Take 60 mg by mouth daily.   Yes Historical Provider, MD    HYDROcodone-acetaminophen (NORCO/VICODIN) 5-325 MG tablet Take 2 tablets by mouth every 4 (four) hours as needed. Patient taking differently: Take 2 tablets by mouth every 4 (four) hours as needed for moderate pain.  05/12/16  Yes Jerelyn Scott, MD  insulin aspart protamine-insulin aspart (NOVOLOG 70/30) (70-30) 100 UNIT/ML injection Inject 6-10 Units into the skin 2 (two) times daily with a meal. Take 10 units in the morning and 6 units in the evening   Yes Historical Provider, MD  methocarbamol (ROBAXIN) 500 MG tablet Take 1 tablet (500 mg total) by mouth 2 (two) times daily. 05/12/16  Yes Jerelyn Scott, MD  metoCLOPramide (REGLAN) 5 MG tablet Take 5 mg by mouth 3 (three) times daily before meals.   Yes Historical Provider, MD  metoprolol (LOPRESSOR) 50 MG tablet Take 50 mg by mouth daily.    Yes Historical Provider, MD  midodrine (PROAMATINE) 10 MG tablet Take 10 mg by mouth 3 (three) times daily.   Yes Historical Provider, MD  NOVOLIN 70/30 (70-30) 100 UNIT/ML injection Inject 10-12 Units into the skin 2 (two) times daily. 04/07/16  Yes Historical Provider, MD  sevelamer (RENVELA) 800 MG tablet Take 2,400 mg by mouth 3 (three) times daily with meals.    Yes Historical Provider, MD  ticagrelor (BRILINTA) 90 MG TABS tablet Take 1 tablet (90 mg total) by mouth 2 (two) times daily. 02/02/15  Yes Calvert Cantor, MD       Physical Exam: BP 96/69   Pulse 88   Temp 98 F (36.7 C)   Resp 18   LMP 05/14/2005 Comment: no period since dialysis in 2006  SpO2 100%  General appearance: Obese chronically ill appearing adult female, alert and in no acute distress.  Awake and alert. Eyes: Anicteric, conjunctiva pink, lids and lashes normal. PERRL.    ENT: No nasal deformity, discharge, epistaxis.  Hearing normal. OP moist without lesions or exudates.   Neck: No neck masses.  Trachea midline.  No thyromegaly/tenderness. Skin: Warm and dry.  Dry and excoriated. Cardiac: RRR, nl S1-S2, no murmurs appreciated.   Capillary refill is brisk.  JVP normal.  No pain with palpation over the precordium. Respiratory: Normal respiratory rate and rhythm.  CTAB without rales or wheezes, soft rhonchi on right. MSK: Bilateral AKA.  No cyanosis or clubbing. Neuro: Cranial nerves 3-12 intact.  Sensation intact to light touch. Speech is fluent.  Muscle strength normal in upper extremities.  Psych: Sensorium intact and responding to questions, attention normal.  Behavior appropriate.  Affect normal.  Judgment and insight appear normal.     Labs on Admission:  I have personally reviewed following labs and imaging studies: CBC:  Recent Labs Lab 05/12/16 1958 05/14/16 1716 05/15/16 0344 05/16/16 0319 05/18/16 0022  WBC 9.1 12.8* 11.5* 13.6* 10.6*  NEUTROABS  --   --   --   --  6.6  HGB 11.8* 12.0 12.1 12.6 11.6*  HCT 36.1 36.4 35.9* 37.9 34.2*  MCV 85.7 86.7 85.3 86.3 84.9  PLT 270 193 205 211 224   Basic Metabolic Panel:  Recent Labs Lab 05/12/16 1958 05/14/16 1716 05/15/16 0344 05/16/16 0319 05/18/16 0022  NA 133* 134* 135 136 134*  K 3.7 4.0 3.8 4.0 3.7  CL 90* 90* 91* 93* 96*  CO2 29 21* 22 21* 22  GLUCOSE 134* 289* 253* 178* 118*  BUN 24* 26* 32* 19 37*  CREATININE 6.85* 5.80* 6.04* 4.58* 7.76*  CALCIUM 10.3 8.6* 8.5* 9.1 7.4*  PHOS  --   --  7.4*  --   --    GFR: Estimated Creatinine Clearance: 9.1 mL/min (by C-G formula based on SCr of 7.76 mg/dL (H)).  Liver Function Tests:  Recent Labs Lab 05/12/16 1958 05/15/16 0344 05/18/16 0022  AST 25  --  46*  ALT 12*  --  29  ALKPHOS 128*  --  189*  BILITOT 0.5  --  1.4*  PROT 8.1  --  7.3  ALBUMIN 3.8 3.7 3.4*    Recent Labs Lab 05/12/16 1958 05/18/16 0022  LIPASE 29 29   No results for input(s): AMMONIA in the last 168 hours. Coagulation Profile: No results for input(s): INR, PROTIME in the last 168 hours. Cardiac Enzymes: No results for input(s): CKTOTAL, CKMB, CKMBINDEX, TROPONINI in the last 168 hours. BNP (last 3  results) No results for input(s): PROBNP in the last 8760 hours. HbA1C: No results for input(s): HGBA1C in the last 72 hours. CBG:  Recent Labs Lab 05/15/16 1309 05/15/16 1718 05/15/16 2022 05/16/16 0807 05/16/16 1130  GLUCAP 101* 147* 174* 284* 203*   Lipid Profile: No results for input(s): CHOL, HDL, LDLCALC, TRIG, CHOLHDL, LDLDIRECT in the last 72 hours. Thyroid Function Tests: No results for input(s): TSH, T4TOTAL, FREET4, T3FREE, THYROIDAB in the last 72 hours. Anemia Panel: No results for input(s): VITAMINB12, FOLATE, FERRITIN, TIBC, IRON, RETICCTPCT in the last 72 hours. Sepsis Labs:  Invalid input(s): PROCALCITONIN, LACTICIDVEN Recent Results (from the past 240 hour(s))  Culture, blood (routine x 2)     Status: None (Preliminary result)   Collection Time: 05/15/16 12:05 AM  Result Value Ref Range Status   Specimen Description BLOOD RIGHT HAND  Final   Special Requests BOTTLES DRAWN AEROBIC AND ANAEROBIC  Final   Culture NO GROWTH 2 DAYS  Final   Report Status PENDING  Incomplete  Culture, blood (routine x 2)     Status: None (Preliminary result)   Collection Time: 05/15/16 12:20 AM  Result Value Ref Range Status   Specimen Description BLOOD LEFT HAND  Final   Special Requests IN PEDIATRIC BOTTLE  Final   Culture NO GROWTH 2 DAYS  Final   Report Status PENDING  Incomplete  MRSA PCR Screening     Status: None   Collection Time: 05/15/16  2:09 AM  Result Value Ref Range Status   MRSA by PCR NEGATIVE NEGATIVE Final    Comment:  The GeneXpert MRSA Assay (FDA approved for NASAL specimens only), is one component of a comprehensive MRSA colonization surveillance program. It is not intended to diagnose MRSA infection nor to guide or monitor treatment for MRSA infections.          Radiological Exams on Admission: Personally reviewed and CXR/AXR shows no SBO and no focal opacity: Dg Abd Acute W/chest  Result Date: 05/18/2016 CLINICAL DATA:   Acute onset of upper back pain and vomiting. Initial encounter. EXAM: DG ABDOMEN ACUTE W/ 1V CHEST COMPARISON:  Chest radiograph performed 05/14/2016, and CT of the abdomen and pelvis from 09/08/2015 FINDINGS: The lungs are well-aerated. Minimal bibasilar atelectasis is noted. There is no evidence of pleural effusion or pneumothorax. The cardiomediastinal silhouette is enlarged. The visualized bowel gas pattern is unremarkable. Scattered stool and air are seen within the colon; there is no evidence of small bowel dilatation to suggest obstruction. No free intra-abdominal air is identified on the provided upright view. No acute osseous abnormalities are seen; the sacroiliac joints are unremarkable in appearance. Small calcified fibroids are noted overlying the pelvis. Scattered vascular calcifications are seen. IMPRESSION: 1. Unremarkable bowel gas pattern; no free intra-abdominal air seen. Small to moderate amount of stool noted in the colon. 2. Minimal bibasilar atelectasis noted.  Cardiomegaly. 3. Small calcified uterine fibroids again noted. 4. Scattered vascular calcifications seen. Electronically Signed   By: Roanna Raider M.D.   On: 05/18/2016 01:08    EKG: Independently reviewed. Rate 75, QTc 457, normal sinus with nonspecific inferolateral T wave changes and ?ST depressions in inferior leads.    Assessment/Plan 1. NSTEMI with nausea/fatigue as anginal equivalent:  Suspect this occurred 5 days ago and was the cause of the patient's nausea/vomiting at last presentation as well as her hypotension.  Currently hemodynamically stable. -Heparin gtt -Aspirin 325 given -Monitor on telemetry -Consult to Cardiology, appreciate cares      2. ESRD on HD MWF:  -Consult to Nephrology for HD -Continue Cinacalcet, Renvela -Hold midodrine for now given #1  3. IDDM:  BG normal at admission. -Hold home 70/30 -SSI q4hrs while NPO  4. HTN and CAD:  -Continue aspirin 81 mg, statin -Continue  ticagrelor for now  -Hold amlodipine while on midodrine (per pharmacy, both are prescribed by Nephrology, will confirm that they are both intended or not)  5. Back pain:  -May continue Norco and Robaxin PRN for pain  6. Other medications: -Continue Reglan TID     DVT prophylaxis: N/A on heparin  Code Status: FULL  Family Communication: None present  Disposition Plan: Anticipate IV heparin and Cardiology evaluation tomorrow, further disposition per Cardiology. Consults called: Cards Admission status: INAPTIENT, telemery       Medical decision making: Patient seen at 2:36 AM on 05/18/2016.  The patient was discussed with Dr. Blinda Leatherwood.  What exists of the patient's chart was reviewed in depth and summarized above.  Clinical condition: stable.        Alberteen Sam [facility] Pager [phone]

## 2016-05-18 NOTE — ED Notes (Signed)
Pt's CBG 75.  Informed Tammy SoursGreg, RN.

## 2016-05-18 NOTE — Consult Note (Signed)
[facility] Renal Consultation Note    Indication for Consultation:  Management of ESRD/hemodialysis; anemia, hypertension/volume and secondary hyperparathyroidism PCP: Billee CashingMCKENZIE, WAYLAND, MD  HPI: Leward QuanRegina F Gregori is a 45 y.o. female with ESRD secondary to DM/HTN on HD since 2006, PVD s/p bilateral AKA and most recently CAD/NSTEMI 01/2016 with PTA and stent of cx 01/2015.  She is mostly compliant with her dialysis treatments and attains or gets close to her EDW of 75.  Pre BP range 130 - 140 but the last three outpatient treatments, her pre HD sBP were 90 +/-  And post systolic BP were 161100 -138. NEt UF range from 0.5 - 2.5.  She was in the ED 9/12 for low back pain and abdominal pain. She went to her outpatient HD 9/13 with low gain and net UF of 0.4.  She was then briefly admitted 9/14-9/16 in OBS status;  dialysis here on Friday yielded a net  UF of only 300 cc due to low BP. Her metoprolol was stopped.  She previously had been on MTP 50 at HS and midodrine 10 TID for BP support.  WBC was elevated when admitted 9/14.  It has normalized and diff is wnl.  There is no growth to date on BC drawn 915. CXR both admissions showed no evidence of infection/volume. Abdominal films today were neg for acute findings. There was a slight increase in AST ant total bili from 9/12 to 9/18. Since discharge Saturday,9/17, she has felt extremely weak and fatigued.  Even putting on shirt tired her out. She has had some N, V, sweats but no overt SOB at rest, chest pain or chest pressure.  Her symptoms are reminiscent of an admission 01/2015 when she had a prior NSTEMI.  Several EKGs showed flat t waves, SR, nonspecific intraventricular conduction delays.  Troponin poc was elevated at 4.31, initial troponin is 3.38.  She is currently NPO/hungry and wants something to eat.  Past Medical History:  Diagnosis Date  . Coronary artery disease 01/2015   single vessel disease CFX, stented 02/01/2015  . Diabetes mellitus     IDDM  . ESRF (end stage renal failure) (HCC)    dialysis M,W, F; left thigh AV graft  . GERD (gastroesophageal reflux disease)   . High cholesterol   . History of gangrene    left foot  . Hx MRSA infection   . Hypertension    states has been on med. x "a long time"  . Impaired mobility    bilateral AKA, is in a wheelchair  . 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 Myocardial infarction (HCC)    " slight heart attack on 01/31/15  . 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 S/P bilateral above knee amputation [facility](HCC)    Past Surgical History:  Procedure Laterality Date  . ABOVE KNEE LEG AMPUTATION  11/08/2009   right  . ABOVE KNEE LEG AMPUTATION  05/21/2009   left  . AV FISTULA PLACEMENT  10/18/2008   right thigh AV graft  . AV FISTULA PLACEMENT  07/18/2004   creation left AV fistula, radial to cephalic  . AV FISTULA REPAIR  07/08/2008   removal left upper arm AV graft  . AV FISTULA REPAIR  07/14/2005   removal infected right forearm AV graft  . CARDIAC CATHETERIZATION N/A 02/01/2015   Procedure: Left Heart Cath and Coronary Angiography;  Surgeon: Lennette Biharihomas A Kelly, MD;  [facility];  Service: Cardiovascular;  Laterality: N/A;  . CARDIAC CATHETERIZATION N/A 02/01/2015   Procedure: Coronary Stent Intervention;  Surgeon: Lennette Bihari, MD;  Location: [facility] INVASIVE CV LAB;  Service: Cardiovascular;  Laterality: N/A;  . CARPAL TUNNEL RELEASE  01/26/2012   Procedure: CARPAL TUNNEL RELEASE;  Surgeon: Nicki Reaper, MD;  Location: [facility];  Service: Orthopedics;  Laterality: Left;  . CENTRAL VENOUS CATHETER INSERTION  08/06/2009   removal right IJ Diatek cath., placement left IJ Diatek cath.  . CENTRAL VENOUS CATHETER INSERTION  07/10/2008; 12/31/2006; 01/19/2006; 06/19/2005; 09/19/2004; 09/08/2004   right IJ Diatek catheter  . DIALYSIS FISTULA CREATION  01/20/2007   left upper arm AV graft  . DIALYSIS FISTULA CREATION  01/28/2006   right upper arm AV  graft  . DIALYSIS FISTULA CREATION  10/04/2005   left forearm AV graft  . DIALYSIS FISTULA CREATION  09/10/2004   right forearm AV graft  . ESOPHAGOGASTRODUODENOSCOPY  06/26/2012   Procedure: ESOPHAGOGASTRODUODENOSCOPY (EGD);  Surgeon: Charna Elizabeth, MD;  Location[facility] ENDOSCOPY;  Service: Endoscopy;  Laterality: N/A;  . EXCISION / CURETTAGE BONE CYST TALUS / CALCANEUS  03/04/2009   partial calcaneal exc. left; placement wound VAC  . FEMORAL ENDARTERECTOMY  11/06/2008   right common femoral; embolectomy right femoral artery; removal right femoral AV graft  . FINGER AMPUTATION  05/27/2010   right middle  . FINGER DEBRIDEMENT  06/20/2010   right middle finger  . FINGER EXPLORATION  07/13/2002   and repair left middle finger  . FOOT AMPUTATION  04/04/2009   left transtibial amputation  . GROIN DEBRIDEMENT  11/16/2008   right rectus femoris muscle flap to right groin wound; right groing debridement  . LEG AMPUTATION BELOW KNEE  10/11/2009   right  . REVISION OF ARTERIOVENOUS GORETEX GRAFT Left 10/01/2015   Procedure: REVISION OF ARTERIOVENOUS GORETEX GRAFT-LEFT THIGH;  Surgeon: Sherren Kerns, MD;  Location: [facility] OR;  Service: Vascular;  Laterality: Left;  . SHUNTOGRAM N/A 11/12/2011   Procedure: Betsey Amen;  Surgeon: Fransisco Hertz, MD;  Location: [facility] CATH LAB;  Service: Cardiovascular;  Laterality: N/A;  . THROMBECTOMY / ARTERIOVENOUS GRAFT REVISION  01/14/2010   thrombectomy left superficial femoral artery; left thigh AV graft placement  . THROMBECTOMY / ARTERIOVENOUS GRAFT REVISION  05/11/2007;12/31/2006; 01/18/2006; 12/16/2005   left upper arm  . THROMBECTOMY / ARTERIOVENOUS GRAFT REVISION  10/26/2005   left forearm AV graft  . THROMBECTOMY / ARTERIOVENOUS GRAFT REVISION  03/11/2005; 10/20/2004   right forearm AV graft  . THROMBECTOMY AND REVISION OF ARTERIOVENTOUS (AV) GORETEX  GRAFT Left 04/24/2012   thigh; replacement of venous limb   Family History  Problem Relation Age of Onset  . Diabetes Mother    . Heart disease Mother   . Hypertension Mother   . Heart attack Mother 43  . Diabetes Sister   . Hypertension Brother   . Heart attack Brother    Social History:  reports that she has never smoked. She has never used smokeless tobacco. She reports that she does not drink alcohol or use drugs. Allergies  Allergen Reactions  . Percocet [Oxycodone-Acetaminophen] Other (See Comments)    GI UPSET  . Flagyl [Metronidazole] Other (See Comments)  . Soap Itching    IVORY SOAP  . Tramadol Itching   Prior to Admission medications   Medication Sig Start Date End Date Taking? Authorizing Provider  aspirin 81 MG chewable tablet Chew 81 mg by mouth daily.   Yes Historical Provider, MD  atorvastatin (  LIPITOR) 40 MG tablet Take 1 tablet (40 mg total) by mouth daily at 6 PM. 02/02/15  Yes Calvert Cantor, MD  cetirizine (ZYRTEC) 10 MG tablet Take 10 mg by mouth daily as needed for allergies.  01/13/16  Yes Historical Provider, MD  cinacalcet (SENSIPAR) 30 MG tablet Take 60 mg by mouth daily.   Yes Historical Provider, MD  HYDROcodone-acetaminophen (NORCO/VICODIN) 5-325 MG tablet Take 2 tablets by mouth every 4 (four) hours as needed. Patient taking differently: Take 2 tablets by mouth every 4 (four) hours as needed for moderate pain.  05/12/16  Yes Jerelyn Scott, MD  insulin aspart protamine-insulin aspart (NOVOLOG 70/30) (70-30) 100 UNIT/ML injection Inject 6-10 Units into the skin 2 (two) times daily with a meal. Take 10 units in the morning and 6 units in the evening   Yes Historical Provider, MD  methocarbamol (ROBAXIN) 500 MG tablet Take 1 tablet (500 mg total) by mouth 2 (two) times daily. 05/12/16  Yes Jerelyn Scott, MD  metoCLOPramide (REGLAN) 5 MG tablet Take 5 mg by mouth 3 (three) times daily before meals.   Yes Historical Provider, MD  metoprolol (LOPRESSOR) 50 MG tablet Take 50 mg by mouth daily.    Yes Historical Provider, MD  midodrine (PROAMATINE) 10 MG tablet Take 10 mg by mouth 3 (three)  times daily.   Yes Historical Provider, MD  NOVOLIN 70/30 (70-30) 100 UNIT/ML injection Inject 10-12 Units into the skin 2 (two) times daily. 04/07/16  Yes Historical Provider, MD  sevelamer (RENVELA) 800 MG tablet Take 2,400 mg by mouth 3 (three) times daily with meals.    Yes Historical Provider, MD  ticagrelor (BRILINTA) 90 MG TABS tablet Take 1 tablet (90 mg total) by mouth 2 (two) times daily. 02/02/15  Yes Calvert Cantor, MD  amLODipine (NORVASC) 10 MG tablet Take 10 mg by mouth daily. 05/15/16   Historical Provider, MD   Current Facility-Administered Medications  Medication Dose Route Frequency Provider Last Rate Last Dose  . aspirin chewable tablet 81 mg  81 mg Oral Daily Alberteen Sam, MD   81 mg at 05/18/16 1158  . atorvastatin (LIPITOR) tablet 40 mg  40 mg Oral q1800 Alberteen Sam, MD      . calcitRIOL (ROCALTROL) capsule 1.5 mcg  1.5 mcg Oral Q M,W,F-HD Weston Settle, PA-C      . Melene Muller ON 05/19/2016] cinacalcet (SENSIPAR) tablet 60 mg  60 mg Oral Q breakfast Alberteen Sam, MD      . heparin ADULT infusion 100 units/mL (25000 units/271mL sodium chloride 0.45%)  1,000 Units/hr Intravenous Continuous Gilda Crease, MD 10 mL/hr at 05/18/16 0246 1,000 Units/hr at 05/18/16 0246  . HYDROcodone-acetaminophen (NORCO/VICODIN) 5-325 MG per tablet 2 tablet  2 tablet Oral Q4H PRN Alberteen Sam, MD   2 tablet at 05/18/16 0701  . insulin aspart (novoLOG) injection 0-9 Units  0-9 Units Subcutaneous Q4H Alberteen Sam, MD      . methocarbamol (ROBAXIN) tablet 500 mg  500 mg Oral BID Alberteen Sam, MD   500 mg at 05/18/16 1158  . metoCLOPramide (REGLAN) tablet 5 mg  5 mg Oral TID AC Alberteen Sam, MD   Stopped at 05/18/16 1202  . metoprolol tartrate (LOPRESSOR) tablet 50 mg  50 mg Oral Daily Alberteen Sam, MD   Stopped at 05/18/16 1201  . nitroGLYCERIN (NITROSTAT) SL tablet 0.4 mg  0.4 mg Sublingual Q5 Min x 3 PRN Alberteen Sam, MD       .  ondansetron (ZOFRAN) injection 4 mg  4 mg Intravenous Q6H PRN Alberteen Sam, MD      . sevelamer carbonate (RENVELA) tablet 2,400 mg  2,400 mg Oral TID WC Alberteen Sam, MD      . ticagrelor (BRILINTA) tablet 90 mg  90 mg Oral BID Alberteen Sam, MD   90 mg at 05/18/16 1159   Labs: Basic Metabolic Panel:  Recent Labs Lab 05/15/16 0344 05/16/16 0319 05/18/16 0022 05/18/16 0719  NA 135 136 134* 134*  K 3.8 4.0 3.7 5.0  CL 91* 93* 96* 95*  CO2 22 21* 22 20*  GLUCOSE 253* 178* 118* 91  BUN 32* 19 37* 41*  CREATININE 6.04* 4.58* 7.76* 8.08*  CALCIUM 8.5* 9.1 7.4* 7.5*  PHOS 7.4*  --   --   --    Liver Function Tests:  Recent Labs Lab 05/12/16 1958 05/15/16 0344 05/18/16 0022  AST 25  --  46*  ALT 12*  --  29  ALKPHOS 128*  --  189*  BILITOT 0.5  --  1.4*  PROT 8.1  --  7.3  ALBUMIN 3.8 3.7 3.4*    Recent Labs Lab 05/12/16 1958 05/18/16 0022  LIPASE 29 29  CBC:  Recent Labs Lab 05/14/16 1716 05/15/16 0344 05/16/16 0319 05/18/16 0022 05/18/16 0719  WBC 12.8* 11.5* 13.6* 10.6* 8.9  NEUTROABS  --   --   --  6.6  --   HGB 12.0 12.1 12.6 11.6* 11.6*  HCT 36.4 35.9* 37.9 34.2* 34.2*  MCV 86.7 85.3 86.3 84.9 84.9  PLT 193 205 211 224 222   Cardiac Enzymes:  Recent Labs Lab 05/18/16 0719  TROPONINI 3.38*   CBG:  Recent Labs Lab 05/15/16 2022 05/16/16 0807 05/16/16 1130 05/18/16 0821 05/18/16 1207  GLUCAP 174* 284* 203* 93 75   Iron Studies: No results for input(s): IRON, TIBC, TRANSFERRIN, FERRITIN in the last 72 hours. Studies/Results: Dg Abd Acute W/chest  Result Date: 05/18/2016 CLINICAL DATA:  Acute onset of upper back pain and vomiting. Initial encounter. EXAM: DG ABDOMEN ACUTE W/ 1V CHEST COMPARISON:  Chest radiograph performed 05/14/2016, and CT of the abdomen and pelvis from 09/08/2015 FINDINGS: The lungs are well-aerated. Minimal bibasilar atelectasis is noted. There is no evidence of pleural effusion or  pneumothorax. The cardiomediastinal silhouette is enlarged. The visualized bowel gas pattern is unremarkable. Scattered stool and air are seen within the colon; there is no evidence of small bowel dilatation to suggest obstruction. No free intra-abdominal air is identified on the provided upright view. No acute osseous abnormalities are seen; the sacroiliac joints are unremarkable in appearance. Small calcified fibroids are noted overlying the pelvis. Scattered vascular calcifications are seen. IMPRESSION: 1. Unremarkable bowel gas pattern; no free intra-abdominal air seen. Small to moderate amount of stool noted in the colon. 2. Minimal bibasilar atelectasis noted.  Cardiomegaly. 3. Small calcified uterine fibroids again noted. 4. Scattered vascular calcifications seen. Electronically Signed   By: Roanna Raider M.D.   On: 05/18/2016 01:08    ROS: As per HPI otherwise negative.  Physical Exam: Vitals:   05/18/16 1300 05/18/16 1311 05/18/16 1316 05/18/16 1330  BP: 124/90 119/90 108/77 105/79  Pulse: 75 75 74 75  Resp: 17     Temp: 97.4 F (36.3 C)     TempSrc: Oral     SpO2: 98%     Weight: 76.2 kg (167 lb 15.9 oz)        General: WDWN NAD on HD  sats ok on room air-  Head: NCAT sclera not icteric MMM Neck: Supple.  Lungs: CTA bilaterally without wheezes, rales, or rhonchi. Breathing is unlabored. Heart: RRR with S1 S2.  Abdomen: soft NT + BS Lower extremities: bilateral AKAs; tr-1+ edema; right thigh no sig edeam Neuro: A & O  X 3. Moves all extremities spontaneously. Psych:  Responds to questions appropriately with a normal affect. Dialysis Access:left thigh AVGG   Dialysis Orders: MWF [facility] 4 hr EDW 75 400/800 2 K 2 Ca profile 4 heparin 6000 Mircera 50 q 2 - last 9/13 no Fe calcitriol 1.5  Recent labs:  hgb 11.7 9/13 19% sat was 21- didn't go up much after a full course of Fe- iPTH 2498 Setp, 859 Aug and 2400 in July  ++Last intervention on thigh graft was 8/10.  AF are chronically  low Renvela 3 ac and sensipar 30 bid - per pt - this is different from outpt med list  Assessment/Plan: 1. NSTEMI - hx PTCA and stent 01/2015 - on brilinta/statin/asa/MTP resumed - plans per cards; serial troponins - 2. Nausea and vomiting -reglan, prn zofran,  3. ESRD -  MWF - K 5 - HD today 4. Hypertension/volume  - BP higher - MTP resumed today- continuing midodrine for BP support -; gains have been minimal likely due to N/V. Goal 1.5 on HD 5. Anemia  - hgb 11.6 - last mircera 50 on 9/13 - no ESA needed for now;  6. Metabolic bone disease - continue calcitriol/renvela/increase sensipar to 60 bid - outpt dose- since her iPTH is so high - need to continue this after discharge 7. Nutrition - renal carb mod/vit when off NPO status. 8. DM - per primary  Sheffield Slider, PA-C [facility] Beeper [phone] 05/18/2016, 1:49 PM    Pt seen, examined and agree w A/P as above. ESRD patient with nausea/ malaise, fatigue found to have ^trop 4.3, w some new EKG changes compared to prior years EKG.  Has hx of cardiac stent x 1.  Admitted for prob NSTEMI, on IV hep.  Today is HD day.  SEe orders.  Vinson Moselle MD [employer] pager [phone]    cell [phone] 05/18/2016, 3:25 PM

## 2016-05-18 NOTE — Progress Notes (Signed)
PROGRESS NOTE                                                                                                                                                                                                             Patient Demographics:    [name], is a 45 y.o. female, DOB - 04-22-1971, DGU:[id number]  Admit date - 05/17/2016   Admitting Physician Alberteen Sam, MD  Outpatient Primary MD for the patient is Billee Cashing, MD  LOS - 0  Chief Complaint  Patient presents with  . Back Pain  . Nasal Congestion       Brief Narrative   Patient was admitted earlier during the day, chart, labs, imaging were reviewed   Subjective:    [name] today has, No headache, No chest pain, No abdominal pain -Report generalized weakness.  Assessment  & Plan :    Principal Problem:   NSTEMI manifesting as nausea/fatigue Active Problems:   ESRD on dialysis   Insulin dependent diabetes mellitus with complications (HCC)   Anemia   CAD S/P CFX DES 02/01/15   Essential hypertension   Hypotension, unspecified   NSTEMI with nausea/fatigue as anginal equivalent:  - Suspect this occurred 5 days ago and was the cause of the patient's nausea/vomiting at last presentation as well as her hypotension.  Currently hemodynamically stable. - Heparin gtt - Aspirin 325 given - Monitor on telemetry - Consult to Cardiology, appreciate cares  ESRD on HD MWF:  - renal consulted  IDDM: - Hold home 70/30, cont With insulin sliding scale   HTN and CAD:  - Continue aspirin 81 mg, statin - Continue ticagrelor for now - Hold amlodipine while on midodrine (per pharmacy, both are prescribed by Nephrology, will confirm that they are both intended or not)  Back pain:  - May continue Norco and Robaxin PRN for pain  Other medications: - Continue Reglan TID     Code Status : Full  Family Communication  : D/W patient  Disposition Plan  :  pending further work up.  Consults  :  Renal, cardiology  Procedures  : none  DVT Prophylaxis  :  Lovenox - Heparin - SCDs   Lab Results  Component Value Date   PLT 222 05/18/2016    Antibiotics  :    Anti-infectives    None  Objective:   Vitals:   05/18/16 1530 05/18/16 1600 05/18/16 1615 05/18/16 1630  BP: 101/72 94/69 101/73 99/70  Pulse: 70 73 73 72  Resp:      Temp:      TempSrc:      SpO2:      Weight:        Wt Readings from Last 3 Encounters:  05/18/16 76.2 kg (167 lb 15.9 oz)  05/15/16 79.9 kg (176 lb 2.4 oz)  10/31/15 68.9 kg (152 lb)    No intake or output data in the 24 hours ending 05/18/16 1641   Physical Exam  Awake Alert, Oriented X 3,  Supple Neck,No JVD,  Symmetrical Chest wall movement, Good air movement bilaterally, CTAB RRR,No Gallops,Rubs or new Murmurs, No Parasternal Heave +ve B.Sounds, Abd Soft, No tenderness. B/L AKA    Data Review:    CBC  Recent Labs Lab 05/14/16 1716 05/15/16 0344 05/16/16 0319 05/18/16 0022 05/18/16 0719  WBC 12.8* 11.5* 13.6* 10.6* 8.9  HGB 12.0 12.1 12.6 11.6* 11.6*  HCT 36.4 35.9* 37.9 34.2* 34.2*  PLT 193 205 211 224 222  MCV 86.7 85.3 86.3 84.9 84.9  MCH 28.6 28.7 28.7 28.8 28.8  MCHC 33.0 33.7 33.2 33.9 33.9  RDW 18.2* 17.8* 17.9* 18.5* 18.6*  LYMPHSABS  --   --   --  3.0  --   MONOABS  --   --   --  0.9  --   EOSABS  --   --   --  0.0  --   BASOSABS  --   --   --  0.0  --     Chemistries   Recent Labs Lab 05/12/16 1958 05/14/16 1716 05/15/16 0344 05/16/16 0319 05/18/16 0022 05/18/16 0719  NA 133* 134* 135 136 134* 134*  K 3.7 4.0 3.8 4.0 3.7 5.0  CL 90* 90* 91* 93* 96* 95*  CO2 29 21* 22 21* 22 20*  GLUCOSE 134* 289* 253* 178* 118* 91  BUN 24* 26* 32* 19 37* 41*  CREATININE 6.85* 5.80* 6.04* 4.58* 7.76* 8.08*  CALCIUM 10.3 8.6* 8.5* 9.1 7.4* 7.5*  AST 25  --   --   --  46*  --   ALT 12*  --   --   --  29  --   ALKPHOS 128*  --   --   --  189*  --   BILITOT 0.5   --   --   --  1.4*  --    ------------------------------------------------------------------------------------------------------------------ No results for input(s): CHOL, HDL, LDLCALC, TRIG, CHOLHDL, LDLDIRECT in the last 72 hours.  Lab Results  Component Value Date   HGBA1C 7.1 (H) 02/01/2015   ------------------------------------------------------------------------------------------------------------------ No results for input(s): TSH, T4TOTAL, T3FREE, THYROIDAB in the last 72 hours.  Invalid input(s): FREET3 ------------------------------------------------------------------------------------------------------------------ No results for input(s): VITAMINB12, FOLATE, FERRITIN, TIBC, IRON, RETICCTPCT in the last 72 hours.  Coagulation profile No results for input(s): INR, PROTIME in the last 168 hours.  No results for input(s): DDIMER in the last 72 hours.  Cardiac Enzymes  Recent Labs Lab 05/18/16 0719  TROPONINI 3.38*   ------------------------------------------------------------------------------------------------------------------ No results found for: BNP  Inpatient Medications  Scheduled Meds: . aspirin  81 mg Oral Daily  . atorvastatin  40 mg Oral q1800  . calcitRIOL  1.5 mcg Oral Q M,W,F-HD  . cinacalcet  60 mg Oral BID WC  . heparin  6,000 Units Dialysis Once in dialysis  . insulin aspart  0-9 Units Subcutaneous  Q4H  . methocarbamol  500 mg Oral BID  . metoCLOPramide  5 mg Oral TID AC  . metoprolol  50 mg Oral Daily  . multivitamin  1 tablet Oral QHS  . sevelamer carbonate  2,400 mg Oral TID WC  . ticagrelor  90 mg Oral BID   Continuous Infusions: . heparin 1,000 Units/hr (05/18/16 0246)   PRN Meds:.sodium chloride, sodium chloride, alteplase, heparin, HYDROcodone-acetaminophen, lidocaine (PF), lidocaine-prilocaine, nitroGLYCERIN, ondansetron (ZOFRAN) IV, pentafluoroprop-tetrafluoroeth  Micro Results Recent Results (from the past 240 hour(s))  Culture,  blood (routine x 2)     Status: None (Preliminary result)   Collection Time: 05/15/16 12:05 AM  Result Value Ref Range Status   Specimen Description BLOOD RIGHT HAND  Final   Special Requests BOTTLES DRAWN AEROBIC AND ANAEROBIC  Final   Culture NO GROWTH 3 DAYS  Final   Report Status PENDING  Incomplete  Culture, blood (routine x 2)     Status: None (Preliminary result)   Collection Time: 05/15/16 12:20 AM  Result Value Ref Range Status   Specimen Description BLOOD LEFT HAND  Final   Special Requests IN PEDIATRIC BOTTLE  Final   Culture NO GROWTH 3 DAYS  Final   Report Status PENDING  Incomplete  MRSA PCR Screening     Status: None   Collection Time: 05/15/16  2:09 AM  Result Value Ref Range Status   MRSA by PCR NEGATIVE NEGATIVE Final    Comment:        The GeneXpert MRSA Assay (FDA approved for NASAL specimens only), is one component of a comprehensive MRSA colonization surveillance program. It is not intended to diagnose MRSA infection nor to guide or monitor treatment for MRSA infections.     Radiology Reports Dg Chest Portable 1 View  Result Date: 05/14/2016 CLINICAL DATA:  Weakness, nausea and vomiting for 2 days. EXAM: PORTABLE CHEST 1 VIEW COMPARISON:  09/08/2015 FINDINGS: Unchanged moderate cardiomegaly. The lungs are clear. The pulmonary vasculature is normal. There is no large effusion. IMPRESSION: Stable cardiomegaly.  No consolidation or effusion. Electronically Signed   By: Ellery Plunk M.D.   On: 05/14/2016 22:13   Dg Abd Acute W/chest  Result Date: 05/18/2016 CLINICAL DATA:  Acute onset of upper back pain and vomiting. Initial encounter. EXAM: DG ABDOMEN ACUTE W/ 1V CHEST COMPARISON:  Chest radiograph performed 05/14/2016, and CT of the abdomen and pelvis from 09/08/2015 FINDINGS: The lungs are well-aerated. Minimal bibasilar atelectasis is noted. There is no evidence of pleural effusion or pneumothorax. The cardiomediastinal silhouette is enlarged.  The visualized bowel gas pattern is unremarkable. Scattered stool and air are seen within the colon; there is no evidence of small bowel dilatation to suggest obstruction. No free intra-abdominal air is identified on the provided upright view. No acute osseous abnormalities are seen; the sacroiliac joints are unremarkable in appearance. Small calcified fibroids are noted overlying the pelvis. Scattered vascular calcifications are seen. IMPRESSION: 1. Unremarkable bowel gas pattern; no free intra-abdominal air seen. Small to moderate amount of stool noted in the colon. 2. Minimal bibasilar atelectasis noted.  Cardiomegaly. 3. Small calcified uterine fibroids again noted. 4. Scattered vascular calcifications seen. Electronically Signed   By: Roanna Raider M.D.   On: 05/18/2016 01:08     Everson Mott M.D on 05/18/2016 at 4:41 PM  Between 7am to 7pm - Pager - [phone]  After 7pm go to [email or web address] - password [facility]  [facility] -  Office  [phone]

## 2016-05-19 LAB — CBC
HEMATOCRIT: 34.9 % — AB (ref 36.0–46.0)
Hemoglobin: 11.5 g/dL — ABNORMAL LOW (ref 12.0–15.0)
MCH: 28.2 pg (ref 26.0–34.0)
MCHC: 33 g/dL (ref 30.0–36.0)
MCV: 85.5 fL (ref 78.0–100.0)
PLATELETS: 240 10*3/uL (ref 150–400)
RBC: 4.08 MIL/uL (ref 3.87–5.11)
RDW: 18.2 % — AB (ref 11.5–15.5)
WBC: 9.1 10*3/uL (ref 4.0–10.5)

## 2016-05-19 LAB — GLUCOSE, CAPILLARY
GLUCOSE-CAPILLARY: 136 mg/dL — AB (ref 65–99)
GLUCOSE-CAPILLARY: 155 mg/dL — AB (ref 65–99)
Glucose-Capillary: 87 mg/dL (ref 65–99)
Glucose-Capillary: 211 mg/dL — ABNORMAL HIGH (ref 65–99)
Glucose-Capillary: 179 mg/dL — ABNORMAL HIGH (ref 65–99)

## 2016-05-19 LAB — HEPARIN LEVEL (UNFRACTIONATED): Heparin Unfractionated: 0.38 IU/mL (ref 0.30–0.70)

## 2016-05-19 MED ORDER — ZOLPIDEM TARTRATE 5 MG PO TABS
5.0000 mg | ORAL_TABLET | Freq: Every evening | ORAL | Status: DC | PRN
  Administered 2016-05-19 – 2016-05-25 (×2): 5 mg via ORAL
  Filled 2016-05-19 (×2): qty 1

## 2016-05-19 NOTE — Progress Notes (Signed)
PROGRESS NOTE                                                                                                                                                                                                             Patient Demographics:    [name], is a 45 y.o. female, DOB - 10/02/1970, BJY:[id number]  Admit date - 05/17/2016   Admitting Physician Alberteen Sam, MD  Outpatient Primary MD for the patient is Billee Cashing, MD  LOS - 1  Chief Complaint  Patient presents with  . Back Pain  . Nasal Congestion       Brief Narrative   45 y.o. female with a past medical history significant for ESRD on HD MWF, bilateral AKA amputee, IDDM, CAD s/p DES in 2016, HTN, and hypotension on midodrine who presents with persistent nausea and fatigue, Patient had similar presentation in the past, worked and she was diagnosed with non-STEMI, where they require cardiac cath with PCI, patient with elevated troponins on admission, admitted for NSTEMI, seen by cardiology,on heparin GTT, possible cath today.   Subjective:    [name] today has, No headache, No chest pain, No abdominal pain -Report generalized weakness.  Assessment  & Plan :    Principal Problem:   NSTEMI manifesting as nausea/fatigue Active Problems:   ESRD on dialysis   Insulin dependent diabetes mellitus with complications (HCC)   Anemia   CAD S/P CFX DES 02/01/15   Essential hypertension   Hypotension, unspecified   NSTEMI with nausea/fatigue as anginal equivalent:  - Cardiology input greatly appreciated, and they suspect this has occurred on September 13, as it resembles her previous admission with non-STEMI. - Management per cardiology, continue with aspirin,Ticagrilor and statin - continue with heparin GTT - for cardiac cath if possible today, otherwise will be tomorrow today or tomorrow  ESRD on HD MWF:  - renal consulted  IDDM: - Hold home 70/30, cont With  insulin sliding scale   CAD:  - Continue aspirin 81 mg, statin, ticagrelor for now - Hold amlodipine while on midodrine  Back pain:  - May continue Norco and Robaxin PRN for pain  Other medications: - Continue Reglan TID     Code Status : Full  Family Communication  : D/W patient  Disposition Plan  : pending further work up.  Consults  :  Renal, cardiology  Procedures  : none  DVT Prophylaxis  :  On heaprin GTT  Lab Results  Component Value Date   PLT 240 05/19/2016    Antibiotics  :    Anti-infectives    None        Objective:   Vitals:   05/18/16 1712 05/18/16 1806 05/18/16 2109 05/19/16 0347  BP: 109/75 97/70 90/63  96/64  Pulse: 70 86 76 (!) 129  Resp: 14 18 18 18   Temp: 97.3 F (36.3 C) 97.5 F (36.4 C) 98.1 F (36.7 C) 97.5 F (36.4 C)  TempSrc: Oral Oral Oral Oral  SpO2: 98% 92% 100% 92%  Weight: 74 kg (163 lb 2.3 oz)   75.4 kg (166 lb 3.6 oz)    Wt Readings from Last 3 Encounters:  05/19/16 75.4 kg (166 lb 3.6 oz)  05/15/16 79.9 kg (176 lb 2.4 oz)  10/31/15 68.9 kg (152 lb)     Intake/Output Summary (Last 24 hours) at 05/19/16 1200 Last data filed at 05/19/16 0703  Gross per 24 hour  Intake              140 ml  Output              990 ml  Net             -850 ml     Physical Exam  Awake Alert, Oriented X 3,  Supple Neck,No JVD,  Symmetrical Chest wall movement, Good air movement bilaterally, CTAB RRR,No Gallops,Rubs or new Murmurs, No Parasternal Heave +ve B.Sounds, Abd Soft, No tenderness. B/L AKA    Data Review:    CBC  Recent Labs Lab 05/15/16 0344 05/16/16 0319 05/18/16 0022 05/18/16 0719 05/19/16 0201  WBC 11.5* 13.6* 10.6* 8.9 9.1  HGB 12.1 12.6 11.6* 11.6* 11.5*  HCT 35.9* 37.9 34.2* 34.2* 34.9*  PLT 205 211 224 222 240  MCV 85.3 86.3 84.9 84.9 85.5  MCH 28.7 28.7 28.8 28.8 28.2  MCHC 33.7 33.2 33.9 33.9 33.0  RDW 17.8* 17.9* 18.5* 18.6* 18.2*  LYMPHSABS  --   --  3.0  --   --   MONOABS  --   --   0.9  --   --   EOSABS  --   --  0.0  --   --   BASOSABS  --   --  0.0  --   --     Chemistries   Recent Labs Lab 05/12/16 1958 05/14/16 1716 05/15/16 0344 05/16/16 0319 05/18/16 0022 05/18/16 0719  NA 133* 134* 135 136 134* 134*  K 3.7 4.0 3.8 4.0 3.7 5.0  CL 90* 90* 91* 93* 96* 95*  CO2 29 21* 22 21* 22 20*  GLUCOSE 134* 289* 253* 178* 118* 91  BUN 24* 26* 32* 19 37* 41*  CREATININE 6.85* 5.80* 6.04* 4.58* 7.76* 8.08*  CALCIUM 10.3 8.6* 8.5* 9.1 7.4* 7.5*  AST 25  --   --   --  46*  --   ALT 12*  --   --   --  29  --   ALKPHOS 128*  --   --   --  189*  --   BILITOT 0.5  --   --   --  1.4*  --    ------------------------------------------------------------------------------------------------------------------ No results for input(s): CHOL, HDL, LDLCALC, TRIG, CHOLHDL, LDLDIRECT in the last 72 hours.  Lab Results  Component Value Date   HGBA1C 7.1 (H) 02/01/2015   ------------------------------------------------------------------------------------------------------------------ No results for  input(s): TSH, T4TOTAL, T3FREE, THYROIDAB in the last 72 hours.  Invalid input(s): FREET3 ------------------------------------------------------------------------------------------------------------------ No results for input(s): VITAMINB12, FOLATE, FERRITIN, TIBC, IRON, RETICCTPCT in the last 72 hours.  Coagulation profile No results for input(s): INR, PROTIME in the last 168 hours.  No results for input(s): DDIMER in the last 72 hours.  Cardiac Enzymes  Recent Labs Lab 05/18/16 0719 05/18/16 1832  TROPONINI 3.38* 2.76*   ------------------------------------------------------------------------------------------------------------------ No results found for: BNP  Inpatient Medications  Scheduled Meds: . aspirin  81 mg Oral Daily  . atorvastatin  40 mg Oral q1800  . calcitRIOL  1.5 mcg Oral Q M,W,F-HD  . cinacalcet  60 mg Oral BID WC  . insulin aspart  0-9 Units  Subcutaneous TID WC  . methocarbamol  500 mg Oral BID  . metoCLOPramide  5 mg Oral TID AC  . metoprolol  50 mg Oral Daily  . multivitamin  1 tablet Oral QHS  . sevelamer carbonate  2,400 mg Oral TID WC  . ticagrelor  90 mg Oral BID   Continuous Infusions: . heparin 1,000 Units/hr (05/18/16 2338)   PRN Meds:.HYDROcodone-acetaminophen, nitroGLYCERIN, ondansetron (ZOFRAN) IV, zolpidem  Micro Results Recent Results (from the past 240 hour(s))  Culture, blood (routine x 2)     Status: None (Preliminary result)   Collection Time: 05/15/16 12:05 AM  Result Value Ref Range Status   Specimen Description BLOOD RIGHT HAND  Final   Special Requests BOTTLES DRAWN AEROBIC AND ANAEROBIC  Final   Culture NO GROWTH 4 DAYS  Final   Report Status PENDING  Incomplete  Culture, blood (routine x 2)     Status: None (Preliminary result)   Collection Time: 05/15/16 12:20 AM  Result Value Ref Range Status   Specimen Description BLOOD LEFT HAND  Final   Special Requests IN PEDIATRIC BOTTLE  Final   Culture NO GROWTH 4 DAYS  Final   Report Status PENDING  Incomplete  MRSA PCR Screening     Status: None   Collection Time: 05/15/16  2:09 AM  Result Value Ref Range Status   MRSA by PCR NEGATIVE NEGATIVE Final    Comment:        The GeneXpert MRSA Assay (FDA approved for NASAL specimens only), is one component of a comprehensive MRSA colonization surveillance program. It is not intended to diagnose MRSA infection nor to guide or monitor treatment for MRSA infections.     Radiology Reports Dg Chest Portable 1 View  Result Date: 05/14/2016 CLINICAL DATA:  Weakness, nausea and vomiting for 2 days. EXAM: PORTABLE CHEST 1 VIEW COMPARISON:  09/08/2015 FINDINGS: Unchanged moderate cardiomegaly. The lungs are clear. The pulmonary vasculature is normal. There is no large effusion. IMPRESSION: Stable cardiomegaly.  No consolidation or effusion. Electronically Signed   By: Ellery Plunk M.D.   On:  05/14/2016 22:13   Dg Abd Acute W/chest  Result Date: 05/18/2016 CLINICAL DATA:  Acute onset of upper back pain and vomiting. Initial encounter. EXAM: DG ABDOMEN ACUTE W/ 1V CHEST COMPARISON:  Chest radiograph performed 05/14/2016, and CT of the abdomen and pelvis from 09/08/2015 FINDINGS: The lungs are well-aerated. Minimal bibasilar atelectasis is noted. There is no evidence of pleural effusion or pneumothorax. The cardiomediastinal silhouette is enlarged. The visualized bowel gas pattern is unremarkable. Scattered stool and air are seen within the colon; there is no evidence of small bowel dilatation to suggest obstruction. No free intra-abdominal air is identified on the provided upright view. No acute osseous abnormalities are  seen; the sacroiliac joints are unremarkable in appearance. Small calcified fibroids are noted overlying the pelvis. Scattered vascular calcifications are seen. IMPRESSION: 1. Unremarkable bowel gas pattern; no free intra-abdominal air seen. Small to moderate amount of stool noted in the colon. 2. Minimal bibasilar atelectasis noted.  Cardiomegaly. 3. Small calcified uterine fibroids again noted. 4. Scattered vascular calcifications seen. Electronically Signed   By: Roanna Raider M.D.   On: 05/18/2016 01:08     Alijah Akram M.D on 05/19/2016 at 12:00 PM  Between 7am to 7pm - Pager - [phone]  After 7pm go to [email or web address] - password [facility]  [facility] -  Office  [phone]

## 2016-05-19 NOTE — Progress Notes (Signed)
ANTICOAGULATION CONSULT NOTE  Pharmacy Consult for Heparin Indication: chest pain/ACS  Allergies  Allergen Reactions  . Percocet [Oxycodone-Acetaminophen] Other (See Comments)    GI UPSET  . Flagyl [Metronidazole] Other (See Comments)  . Soap Itching    IVORY SOAP  . Tramadol Itching    Patient Measurements: Weight: 166 lb 3.6 oz (75.4 kg) Heparin Dosing Weight: 70 kg  Vital Signs: Temp: 97.5 F (36.4 C) (09/19 0347) Temp Source: Oral (09/19 0347) BP: 96/64 (09/19 0347) Pulse Rate: 129 (09/19 0347)  Labs:  Recent Labs  05/18/16 0022 05/18/16 0719 05/18/16 1131 05/18/16 1832 05/19/16 0201  HGB 11.6* 11.6*  --   --  11.5*  HCT 34.2* 34.2*  --   --  34.9*  PLT 224 222  --   --  240  HEPARINUNFRC  --   --  0.59 0.54 0.38  CREATININE 7.76* 8.08*  --   --   --   TROPONINI  --  3.38*  --  2.76*  --     Estimated Creatinine Clearance: 8.5 mL/min (by C-G formula based on SCr of 8.08 mg/dL (H)).   Assessment: 45 y.o. female started on IV heparin for r/o ACS. Troponin elevated. Not on anticoagulation PTA. Heparin level remains therapeutic this AM (0.38). No bleeding or line issues noted. Hg low stable, plt wnl. Possible cath today vs. Wed post-HD per MD note.  Goal of Therapy:  Heparin level 0.3-0.7 units/ml Monitor platelets by anticoagulation protocol: Yes   Plan:  - Continue heparin gtt 1000 units/hr - Daily heparin level and CBC - Monitor for s/sx bleeding - F/u Cards plans  Babs BertinHaley Kymani Laursen, PharmD, [facility] Clinical Pharmacist Pager [phone] 05/19/2016 9:46 AM

## 2016-05-19 NOTE — Progress Notes (Signed)
Subjective: No further N & V or weakness.  No pain but she never has pain  Objective: Vital signs in last 24 hours: Temp:  [97.3 F (36.3 C)-98.1 F (36.7 C)] 97.5 F (36.4 C) (09/19 0347) Pulse Rate:  [66-129] 129 (09/19 0347) Resp:  [14-18] 18 (09/19 0347) BP: (90-124)/(63-90) 96/64 (09/19 0347) SpO2:  [92 %-100 %] 92 % (09/19 0347) Weight:  [163 lb 2.3 oz (74 kg)-167 lb 15.9 oz (76.2 kg)] 166 lb 3.6 oz (75.4 kg) (09/19 0347) Weight change:  Last BM Date: 05/19/16 Intake/Output from previous day: 09/18 0701 - 09/19 0700 In: -  Out: 990  Intake/Output this shift: Total I/O In: 140 [P.O.:140] Out: -   PE: General:Pleasant affect, NAD Skin:Warm and dry, brisk capillary refill HEENT:normocephalic, sclera clear, mucus membranes moist Neck:supple, no JVD, no bruits  Heart:S1S2 RRR without murmur, gallup, rub or click Lungs:clear without rales, rhonchi, or wheezes GNF:AOZHAbd:soft, non tender, + BS, do not palpate liver spleen or masses Ext:bil amputation of legs AKA  , 2+ radial pulses Neuro:alert and oriented, MAE, follows commands, + facial symmetry Tele:  SR with  pvcs  Lab Results:  Recent Labs  05/18/16 0719 05/19/16 0201  WBC 8.9 9.1  HGB 11.6* 11.5*  HCT 34.2* 34.9*  PLT 222 240   BMET  Recent Labs  05/18/16 0022 05/18/16 0719  NA 134* 134*  K 3.7 5.0  CL 96* 95*  CO2 22 20*  GLUCOSE 118* 91  BUN 37* 41*  CREATININE 7.76* 8.08*  CALCIUM 7.4* 7.5*    Recent Labs  05/18/16 0719 05/18/16 1832  TROPONINI 3.38* 2.76*    Lab Results  Component Value Date   CHOL 121 (L) 09/10/2015   HDL 34 (L) 09/10/2015   LDLCALC 63 09/10/2015   TRIG 118 09/10/2015   CHOLHDL 3.6 09/10/2015   Lab Results  Component Value Date   HGBA1C 7.1 (H) 02/01/2015     Lab Results  Component Value Date   TSH 2.358 02/01/2015    Hepatic Function Panel  Recent Labs  05/18/16 0022  PROT 7.3  ALBUMIN 3.4*  AST 46*  ALT 29  ALKPHOS 189*  BILITOT 1.4*     No results for input(s): CHOL in the last 72 hours. No results for input(s): PROTIME in the last 72 hours.     Studies/Results: Dg Abd Acute W/chest  Result Date: 05/18/2016 CLINICAL DATA:  Acute onset of upper back pain and vomiting. Initial encounter. EXAM: DG ABDOMEN ACUTE W/ 1V CHEST COMPARISON:  Chest radiograph performed 05/14/2016, and CT of the abdomen and pelvis from 09/08/2015 FINDINGS: The lungs are well-aerated. Minimal bibasilar atelectasis is noted. There is no evidence of pleural effusion or pneumothorax. The cardiomediastinal silhouette is enlarged. The visualized bowel gas pattern is unremarkable. Scattered stool and air are seen within the colon; there is no evidence of small bowel dilatation to suggest obstruction. No free intra-abdominal air is identified on the provided upright view. No acute osseous abnormalities are seen; the sacroiliac joints are unremarkable in appearance. Small calcified fibroids are noted overlying the pelvis. Scattered vascular calcifications are seen. IMPRESSION: 1. Unremarkable bowel gas pattern; no free intra-abdominal air seen. Small to moderate amount of stool noted in the colon. 2. Minimal bibasilar atelectasis noted.  Cardiomegaly. 3. Small calcified uterine fibroids again noted. 4. Scattered vascular calcifications seen. Electronically Signed   By: Roanna RaiderJeffery  Chang M.D.   On: 05/18/2016 01:08    Medications: I have  reviewed the patient's current medications. Scheduled Meds: . aspirin  81 mg Oral Daily  . atorvastatin  40 mg Oral q1800  . calcitRIOL  1.5 mcg Oral Q M,W,F-HD  . cinacalcet  60 mg Oral BID WC  . insulin aspart  0-9 Units Subcutaneous TID WC  . methocarbamol  500 mg Oral BID  . metoCLOPramide  5 mg Oral TID AC  . metoprolol  50 mg Oral Daily  . multivitamin  1 tablet Oral QHS  . sevelamer carbonate  2,400 mg Oral TID WC  . ticagrelor  90 mg Oral BID   Continuous Infusions: . heparin 1,000 Units/hr (05/18/16 2338)   PRN  Meds:.HYDROcodone-acetaminophen, nitroGLYCERIN, ondansetron (ZOFRAN) IV, zolpidem  Assessment/Plan: Principal Problem:   NSTEMI manifesting as nausea/fatigue,  pk troponin 4.31, on IV heparin.  For cath tomorrow.   ? Artery for cath - last one Rt femoral artery.   She will be 3rd case tomorrow with Dr. Katrinka Blazing Active Problems:   ESRD on dialysis MWF   Insulin dependent diabetes mellitus with complications (HCC)   Anemia- stable    CAD S/P CFX DES 02/01/15   Essential hypertension, now 90s to 109 systolic    Hypotension, unspecified    LOS: 1 day   Time spent with pt. :15 minutes. Nada Boozer  Nurse Practitioner Certified Pager [phone] or after 5pm and on weekends call [phone] 05/19/2016, 12:39 PM  Patient seen and examined. Agree with assessment and plan. No recurrent symptoms. Troponins are trending downward.  Cath schedule is too overbooked today to add patient. Have scheduled patient for cath tomorrow with plans for dialysis post cath.    Lennette Bihari, MD, [facility] 05/19/2016 1:36 PM

## 2016-05-19 NOTE — Progress Notes (Signed)
05/18/2016 1805 Received pt to room 2W36 from dialysis.  Pt is A&O, no c/o voiced except she is hungry.  Informed pt on diet and gave her menu to call and order.  Tele monitor applied and CCMD.  Oriented to room, call light and bed.  Call bell in reach. Kathryne HitchAllen, Walker Sitar C

## 2016-05-19 NOTE — Progress Notes (Signed)
[name] Progress Note   Subjective: no c/o  Vitals:   05/18/16 1712 05/18/16 1806 05/18/16 2109 05/19/16 0347  BP: 109/75 97/70 90/63  96/64  Pulse: 70 86 76 (!) 129  Resp: 14 18 18 18   Temp: 97.3 F (36.3 C) 97.5 F (36.4 C) 98.1 F (36.7 C) 97.5 F (36.4 C)  TempSrc: Oral Oral Oral Oral  SpO2: 98% 92% 100% 92%  Weight: 74 kg (163 lb 2.3 oz)   75.4 kg (166 lb 3.6 oz)    Inpatient medications: . aspirin  81 mg Oral Daily  . atorvastatin  40 mg Oral q1800  . calcitRIOL  1.5 mcg Oral Q M,W,F-HD  . cinacalcet  60 mg Oral BID WC  . insulin aspart  0-9 Units Subcutaneous TID WC  . methocarbamol  500 mg Oral BID  . metoCLOPramide  5 mg Oral TID AC  . metoprolol  50 mg Oral Daily  . multivitamin  1 tablet Oral QHS  . sevelamer carbonate  2,400 mg Oral TID WC  . ticagrelor  90 mg Oral BID   . heparin 1,000 Units/hr (05/18/16 2338)   HYDROcodone-acetaminophen, nitroGLYCERIN, ondansetron (ZOFRAN) IV, zolpidem  Exam: General: WDWN NAD on HD sats ok on room air-  Head: NCAT sclera not icteric MMM Neck: Supple.  Lungs: CTA bilaterally without wheezes, rales, or rhonchi. Breathing is unlabored. Heart: RRR with S1 S2.  Abdomen: soft NT + BS Lower extremities: bilateral AKAs; tr-1+ edema; right thigh no sig edeam Neuro: A & O  X 3. Moves all extremities spontaneously. Psych:  Responds to questions appropriately with a normal affect. Dialysis Access:left thigh AVGG   Dialysis Orders: MWF [facility]  4h  75kg   2/2 bath  P4   Hep 6000   L thigh AVG Mircera 50 q 2 - last 9/13 no Fe calcitriol 1.5  Recent labs:  hgb 11.7 9/13 19% sat was 21- didn't go up much after a full course of Fe- iPTH 2498 Setp, 859 Aug and 2400 in July  ++Last intervention on thigh graft was 8/10.  AF chronically low Renvela 3 ac and sensipar 30 bid - per pt - this is different from outpt med list  Assessment/Plan: 1. NSTEMI - hx PTCA and stent 01/2015 - on brilinta/statin/asa/MTP resumed,  possible heart cath today.  On metoprolol.  2. ESRD -  MWF HD. HD tomorrow 3. Volume - at dry wt 4. Chron hypotension - on midodrine 5. Anemia  - hgb 11.6 - last mircera 50 on 9/13 - no ESA needed for now;  6. Metabolic bone disease - continue calcitriol/renvela/increase sensipar to 60 bid - outpt dose- since her iPTH is so high - need to continue this after discharge 7. Nutrition - renal carb mod/vit when off NPO status. 8. DM - per primary  Plan - HD tomorrow, min UF.     Mary Moselleob Gem Conkle MD [facility] pager [phone]    cell [phone] 05/19/2016, 12:06 PM    Recent Labs Lab 05/15/16 0344 05/16/16 0319 05/18/16 0022 05/18/16 0719  NA 135 136 134* 134*  K 3.8 4.0 3.7 5.0  CL 91* 93* 96* 95*  CO2 22 21* 22 20*  GLUCOSE 253* 178* 118* 91  BUN 32* 19 37* 41*  CREATININE 6.04* 4.58* 7.76* 8.08*  CALCIUM 8.5* 9.1 7.4* 7.5*  PHOS 7.4*  --   --   --     Recent Labs Lab 05/12/16 1958 05/15/16 0344 05/18/16 0022  AST 25  --  46*  ALT 12*  --  29  ALKPHOS 128*  --  189*  BILITOT 0.5  --  1.4*  PROT 8.1  --  7.3  ALBUMIN 3.8 3.7 3.4*    Recent Labs Lab 05/18/16 0022 05/18/16 0719 05/19/16 0201  WBC 10.6* 8.9 9.1  NEUTROABS 6.6  --   --   HGB 11.6* 11.6* 11.5*  HCT 34.2* 34.2* 34.9*  MCV 84.9 84.9 85.5  PLT 224 222 240   Iron/TIBC/Ferritin/ %Sat    Component Value Date/Time   IRON 48 06/17/2012 1356   TIBC 218 (L) 06/17/2012 1356   FERRITIN 1,262 (H) 06/17/2012 1356   IRONPCTSAT 22 06/17/2012 1356

## 2016-05-20 ENCOUNTER — Other Ambulatory Visit: Payer: Self-pay

## 2016-05-20 ENCOUNTER — Ambulatory Visit (HOSPITAL_COMMUNITY)
Admission: RE | Admit: 2016-05-20 | Payer: Medicare Other | Source: Ambulatory Visit | Admitting: Interventional Cardiology

## 2016-05-20 ENCOUNTER — Encounter (HOSPITAL_COMMUNITY)
Admission: EM | Disposition: A | Discharge status: Hospice | Payer: Self-pay | Source: Home / Self Care | Attending: Pulmonary Disease

## 2016-05-20 ENCOUNTER — Inpatient Hospital Stay (HOSPITAL_COMMUNITY): Payer: Medicare Other

## 2016-05-20 DIAGNOSIS — R079 Chest pain, unspecified: Secondary | ICD-10-CM

## 2016-05-20 DIAGNOSIS — R570 Cardiogenic shock: Secondary | ICD-10-CM

## 2016-05-20 LAB — RENAL FUNCTION PANEL
Albumin: 3.3 g/dL — ABNORMAL LOW (ref 3.5–5.0)
Anion gap: 19 — ABNORMAL HIGH (ref 5–15)
BUN: 26 mg/dL — ABNORMAL HIGH (ref 6–20)
CHLORIDE: 90 mmol/L — AB (ref 101–111)
CO2: 22 mmol/L (ref 22–32)
Calcium: 8.2 mg/dL — ABNORMAL LOW (ref 8.9–10.3)
Creatinine, Ser: 6.02 mg/dL — ABNORMAL HIGH (ref 0.44–1.00)
GFR, EST AFRICAN AMERICAN: 9 mL/min — AB (ref >60)
GFR, EST NON AFRICAN AMERICAN: 8 mL/min — AB (ref >60)
Glucose, Bld: 115 mg/dL — ABNORMAL HIGH (ref 65–99)
POTASSIUM: 3.8 mmol/L (ref 3.5–5.1)
Phosphorus: 6.4 mg/dL — ABNORMAL HIGH (ref 2.5–4.6)
Sodium: 131 mmol/L — ABNORMAL LOW (ref 135–145)

## 2016-05-20 LAB — ECHOCARDIOGRAM COMPLETE
CHL CUP REG VEL DIAS: 148 cm/s
FS: 5 % — AB (ref 28–44)
IV/PV OW: 0.75
LA diam end sys: 31 mm
LADIAMINDEX: 1.72 cm/m2
LASIZE: 31 mm
LAVOLA4C: 72.6 mL
LDCA: 2.27 cm2
LV PW d: 10.8 mm — AB (ref 0.6–1.1)
LVELAT: 5.73 cm/s
LVOT diameter: 17 mm
MV VTI: 86.6 cm
PV Reg grad dias: 9 mmHg
RV sys press: 42 mmHg
Reg peak vel: 259 cm/s
TDI e' lateral: 5.73
TRMAXVEL: 259 cm/s
Weight: 2698.43 oz

## 2016-05-20 LAB — CULTURE, BLOOD (ROUTINE X 2)
CULTURE: NO GROWTH
CULTURE: NO GROWTH

## 2016-05-20 LAB — MRSA PCR SCREENING: MRSA BY PCR: NEGATIVE

## 2016-05-20 LAB — CBC
HEMATOCRIT: 34.1 % — AB (ref 36.0–46.0)
HEMATOCRIT: 34.5 % — AB (ref 36.0–46.0)
Hemoglobin: 11.9 g/dL — ABNORMAL LOW (ref 12.0–15.0)
Hemoglobin: 11.6 g/dL — ABNORMAL LOW (ref 12.0–15.0)
MCH: 29.5 pg (ref 26.0–34.0)
MCH: 28.9 pg (ref 26.0–34.0)
MCHC: 34.9 g/dL (ref 30.0–36.0)
MCHC: 33.6 g/dL (ref 30.0–36.0)
MCV: 84.6 fL (ref 78.0–100.0)
MCV: 85.8 fL (ref 78.0–100.0)
PLATELETS: 251 10*3/uL (ref 150–400)
Platelets: 260 10*3/uL (ref 150–400)
RBC: 4.03 MIL/uL (ref 3.87–5.11)
RBC: 4.02 MIL/uL (ref 3.87–5.11)
RDW: 18.4 % — AB (ref 11.5–15.5)
RDW: 18.3 % — AB (ref 11.5–15.5)
WBC: 8.1 10*3/uL (ref 4.0–10.5)
WBC: 8 10*3/uL (ref 4.0–10.5)

## 2016-05-20 LAB — HEPARIN LEVEL (UNFRACTIONATED): Heparin Unfractionated: 0.54 IU/mL (ref 0.30–0.70)

## 2016-05-20 LAB — LACTIC ACID, PLASMA
Lactic Acid, Venous: 3 mmol/L (ref 0.5–1.9)
Lactic Acid, Venous: 4.1 mmol/L (ref 0.5–1.9)

## 2016-05-20 LAB — TROPONIN I
TROPONIN I: 2.24 ng/mL — AB (ref <0.03)
TROPONIN I: 2 ng/mL — AB (ref <0.03)
TROPONIN I: 2.29 ng/mL — AB (ref <0.03)

## 2016-05-20 LAB — GLUCOSE, CAPILLARY
GLUCOSE-CAPILLARY: 223 mg/dL — AB (ref 65–99)
Glucose-Capillary: 125 mg/dL — ABNORMAL HIGH (ref 65–99)
Glucose-Capillary: 136 mg/dL — ABNORMAL HIGH (ref 65–99)
Glucose-Capillary: 190 mg/dL — ABNORMAL HIGH (ref 65–99)
Glucose-Capillary: 184 mg/dL — ABNORMAL HIGH (ref 65–99)

## 2016-05-20 LAB — PROTIME-INR
INR: 1.27
Prothrombin Time: 16 seconds — ABNORMAL HIGH (ref 11.4–15.2)

## 2016-05-20 LAB — CARBOXYHEMOGLOBIN
CARBOXYHEMOGLOBIN: 0.9 % (ref 0.5–1.5)
METHEMOGLOBIN: 1.1 % (ref 0.0–1.5)
O2 Saturation: 46.4 %
Total hemoglobin: 12.6 g/dL (ref 12.0–16.0)

## 2016-05-20 LAB — CORTISOL: CORTISOL PLASMA: 28.9 ug/dL

## 2016-05-20 SURGERY — INVASIVE LAB ABORTED CASE

## 2016-05-20 MED ORDER — ALTEPLASE 2 MG IJ SOLR: 2.0000 mg | Freq: Once | INTRAMUSCULAR | Status: DC | PRN

## 2016-05-20 MED ORDER — PERFLUTREN LIPID MICROSPHERE
INTRAVENOUS | Status: AC
  Administered 2016-05-20: 3 mL via INTRAVENOUS
  Filled 2016-05-20: qty 10

## 2016-05-20 MED ORDER — INSULIN ASPART 100 UNIT/ML ~~LOC~~ SOLN
0.0000 [IU] | Freq: Three times a day (TID) | SUBCUTANEOUS | Status: DC
  Administered 2016-05-21: 2 [IU] via SUBCUTANEOUS
  Administered 2016-05-21: 1 [IU] via SUBCUTANEOUS
  Administered 2016-05-22: 5 [IU] via SUBCUTANEOUS
  Administered 2016-05-22: 7 [IU] via SUBCUTANEOUS
  Administered 2016-05-22 – 2016-05-23 (×2): 3 [IU] via SUBCUTANEOUS
  Administered 2016-05-23: 5 [IU] via SUBCUTANEOUS
  Administered 2016-05-23: 1 [IU] via SUBCUTANEOUS
  Administered 2016-05-24: 3 [IU] via SUBCUTANEOUS
  Administered 2016-05-24 – 2016-05-25 (×3): 2 [IU] via SUBCUTANEOUS

## 2016-05-20 MED ORDER — MIDAZOLAM HCL 2 MG/2ML IJ SOLN
INTRAMUSCULAR | Status: DC | PRN
  Administered 2016-05-20: 1 mg via INTRAVENOUS

## 2016-05-20 MED ORDER — DIPHENHYDRAMINE HCL 25 MG PO CAPS
25.0000 mg | ORAL_CAPSULE | Freq: Three times a day (TID) | ORAL | Status: DC | PRN
  Administered 2016-05-21 – 2016-05-24 (×7): 25 mg via ORAL
  Filled 2016-05-20 (×7): qty 1

## 2016-05-20 MED ORDER — PERFLUTREN LIPID MICROSPHERE
1.0000 mL | INTRAVENOUS | Status: AC | PRN
  Administered 2016-05-20: 3 mL via INTRAVENOUS
  Filled 2016-05-20: qty 10

## 2016-05-20 MED ORDER — SODIUM CHLORIDE 0.9 % IV SOLN: 100.0000 mL | INTRAVENOUS | Status: DC | PRN

## 2016-05-20 MED ORDER — MIDAZOLAM HCL 2 MG/2ML IJ SOLN
INTRAMUSCULAR | Status: AC
  Filled 2016-05-20: qty 2

## 2016-05-20 MED ORDER — SODIUM CHLORIDE 0.9 % IV SOLN
INTRAVENOUS | Status: DC
  Administered 2016-05-20: 11:00:00 via INTRAVENOUS

## 2016-05-20 MED ORDER — SODIUM CHLORIDE 0.9 % IV BOLUS (SEPSIS)
1000.0000 mL | Freq: Once | INTRAVENOUS | Status: AC
  Administered 2016-05-20: 1000 mL via INTRAVENOUS

## 2016-05-20 MED ORDER — HEPARIN (PORCINE) IN NACL 2-0.9 UNIT/ML-% IJ SOLN
INTRAMUSCULAR | Status: AC
  Filled 2016-05-20: qty 1000

## 2016-05-20 MED ORDER — PENTAFLUOROPROP-TETRAFLUOROETH EX AERO: 1.0000 "application " | INHALATION_SPRAY | CUTANEOUS | Status: DC | PRN

## 2016-05-20 MED ORDER — LIDOCAINE HCL (PF) 1 % IJ SOLN
INTRAMUSCULAR | Status: AC
  Filled 2016-05-20: qty 30

## 2016-05-20 MED ORDER — ASPIRIN 81 MG PO CHEW: 81.0000 mg | CHEWABLE_TABLET | ORAL | Status: DC

## 2016-05-20 MED ORDER — HYDROCERIN EX CREA
TOPICAL_CREAM | Freq: Two times a day (BID) | CUTANEOUS | Status: DC
  Administered 2016-05-20: 21:00:00 via TOPICAL
  Administered 2016-05-21 (×2): 1 via TOPICAL
  Administered 2016-05-22: 21:00:00 via TOPICAL
  Administered 2016-05-22 – 2016-05-23 (×2): 1 via TOPICAL
  Administered 2016-05-23 – 2016-05-24 (×2): via TOPICAL
  Administered 2016-05-25: 1 via TOPICAL
  Filled 2016-05-20: qty 113

## 2016-05-20 MED ORDER — LIDOCAINE-PRILOCAINE 2.5-2.5 % EX CREA
1.0000 "application " | TOPICAL_CREAM | CUTANEOUS | Status: DC | PRN
  Filled 2016-05-20: qty 5

## 2016-05-20 MED ORDER — DOPAMINE-DEXTROSE 3.2-5 MG/ML-% IV SOLN
INTRAVENOUS | Status: AC
  Administered 2016-05-20: 10 ug/kg/min via INTRAVENOUS
  Filled 2016-05-20: qty 250

## 2016-05-20 MED ORDER — LIDOCAINE HCL (PF) 1 % IJ SOLN: 5.0000 mL | INTRAMUSCULAR | Status: DC | PRN

## 2016-05-20 MED ORDER — DOPAMINE-DEXTROSE 3.2-5 MG/ML-% IV SOLN
0.0000 ug/kg/min | INTRAVENOUS | Status: DC
  Administered 2016-05-20: 10 ug/kg/min via INTRAVENOUS
  Administered 2016-05-21: 6.972 ug/kg/min via INTRAVENOUS
  Administered 2016-05-22: 8 ug/kg/min via INTRAVENOUS
  Filled 2016-05-20 (×2): qty 250

## 2016-05-20 MED ORDER — SODIUM CHLORIDE 0.9% FLUSH: 3.0000 mL | INTRAVENOUS | Status: DC | PRN

## 2016-05-20 MED ORDER — DOPAMINE-DEXTROSE 3.2-5 MG/ML-% IV SOLN: 0.0000 ug/kg/min | INTRAVENOUS | Status: DC

## 2016-05-20 MED ORDER — SODIUM CHLORIDE 0.9 % IV BOLUS (SEPSIS): 250.0000 mL | Freq: Once | INTRAVENOUS | Status: DC

## 2016-05-20 MED ORDER — SODIUM CHLORIDE 0.9% FLUSH
3.0000 mL | Freq: Two times a day (BID) | INTRAVENOUS | Status: DC
  Administered 2016-05-20: 3 mL via INTRAVENOUS

## 2016-05-20 MED ORDER — ONDANSETRON HCL 4 MG/2ML IJ SOLN
INTRAMUSCULAR | Status: DC | PRN
  Administered 2016-05-20: 4 mg via INTRAVENOUS

## 2016-05-20 MED ORDER — IOPAMIDOL (ISOVUE-370) INJECTION 76%
INTRAVENOUS | Status: AC
  Filled 2016-05-20: qty 100

## 2016-05-20 MED ORDER — SODIUM CHLORIDE 0.9 % IV SOLN: 250.0000 mL | INTRAVENOUS | Status: DC | PRN

## 2016-05-20 MED ORDER — ONDANSETRON HCL 4 MG/2ML IJ SOLN
INTRAMUSCULAR | Status: AC
  Filled 2016-05-20: qty 2

## 2016-05-20 MED ORDER — INSULIN ASPART 100 UNIT/ML ~~LOC~~ SOLN
0.0000 [IU] | Freq: Every day | SUBCUTANEOUS | Status: DC
  Administered 2016-05-20 – 2016-05-23 (×3): 2 [IU] via SUBCUTANEOUS

## 2016-05-20 MED ORDER — DOPAMINE-DEXTROSE 3.2-5 MG/ML-% IV SOLN
0.0000 ug/kg/min | INTRAVENOUS | Status: DC
  Administered 2016-05-20 (×2): 10 ug/kg/min via INTRAVENOUS

## 2016-05-20 MED ORDER — HEPARIN SODIUM (PORCINE) 1000 UNIT/ML DIALYSIS: 1000.0000 [IU] | INTRAMUSCULAR | Status: DC | PRN

## 2016-05-20 SURGICAL SUPPLY — 10 items
CATH SWAN GANZ 7F STRAIGHT (CATHETERS) ×3 IMPLANT
KIT HEART LEFT (KITS) ×4 IMPLANT
PACK CARDIAC CATHETERIZATION (CUSTOM PROCEDURE TRAY) ×4 IMPLANT
SHEATH PINNACLE 5F 10CM (SHEATH) ×3 IMPLANT
SHEATH PINNACLE 7F 10CM (SHEATH) ×3 IMPLANT
TRANSDUCER W/STOPCOCK (MISCELLANEOUS) ×5 IMPLANT
TUBING ART PRESS 72  MALE/FEM (TUBING) ×2
TUBING ART PRESS 72 MALE/FEM (TUBING) ×1 IMPLANT
TUBING CIL FLEX 10 FLL-RA (TUBING) ×4 IMPLANT
WIRE EMERALD 3MM-J .035X150CM (WIRE) ×3 IMPLANT

## 2016-05-20 NOTE — Progress Notes (Signed)
[facility] Progress Note   Subjective: BP's dropped into 60's and 70's this am.  No CP, +back pain which is not new.  No n/v , abd pain , no bloody stool.  On IV hep.  HR 70. Getting NS bolus.  EKG is unchanged from 9/18.    Vitals:   05/20/16 0811 05/20/16 0812 05/20/16 0832 05/20/16 0837  BP: (!) 66/53 (!) 73/57 (!) 56/22 (!) 69/51  Pulse: 67 68 69   Resp:   20   Temp:   (!) 96.4 F (35.8 C)   TempSrc:   Rectal   SpO2: 98% 98%    Weight:        Inpatient medications: . aspirin  81 mg Oral Daily  . atorvastatin  40 mg Oral q1800  . calcitRIOL  1.5 mcg Oral Q M,W,F-HD  . cinacalcet  60 mg Oral BID WC  . insulin aspart  0-9 Units Subcutaneous TID WC  . methocarbamol  500 mg Oral BID  . metoCLOPramide  5 mg Oral TID AC  . multivitamin  1 tablet Oral QHS  . sevelamer carbonate  2,400 mg Oral TID WC  . sodium chloride  250 mL Intravenous Once  . sodium chloride  250 mL Intravenous Once  . ticagrelor  90 mg Oral BID   . heparin 1,000 Units/hr (05/20/16 0208)   sodium chloride, diphenhydrAMINE, HYDROcodone-acetaminophen, nitroGLYCERIN, ondansetron (ZOFRAN) IV, zolpidem  Exam: General: sleepy but arouses easily, Ox 3 Head: NCAT sclera not icteric MMM Neck: Supple.  Lungs: faint rales R base, L clear Heart: RRR with S1 S2.  Abdomen: soft NT + BS Lower extremities: bilateral AKAs; tr-1+ edema; right thigh no sig edeam Neuro: A & O  X 3. Moves all extremities spontaneously. Psych:  Responds to questions appropriately with a normal affect. Dialysis Access:left thigh AVGG   Dialysis Orders: MWF [facility]  4h  75kg   2/2 bath  P4   Hep 6000   L thigh AVG Mircera 50 q 2 - last 9/13 no Fe calcitriol 1.5  Recent labs:  hgb 11.7 9/13 19% sat was 21- didn't go up much after a full course of Fe- iPTH 2498 Setp, 859 Aug and 2400 in July  ++Last intervention on thigh graft was 8/10.  AF chronically low Renvela 3 ac and sensipar 30 bid - per pt - this is different from  outpt med list  Assessment/Plan: 1. Hypotension - acutely worse this am, progressive issue over last few weeks. Was just started on midodrine 2-3 weeks ago at the outpt HD unit.  In Jan here her BP was 145/80.  Not septic, poss cardiac. RR is working w pt now, getting NS bolus.  No vol excess on exam.   2. NSTEMI - hx PTCA and stent 01/2015, old inf MI by echo last yr 3. ESRD -  MWF HD. Hold HD today d/t #1 4. Volume - 1kg over dry wt, asymptomatic 5. Anemia  - hgb 11.6 - last mircera 50 on 9/13 - no ESA needed for now;  6. Metabolic bone disease - continue calcitriol/renvela/increase sensipar to 60 bid - outpt dose- since her iPTH is so high - need to continue this after discharge 7. Nutrition - renal carb mod/vit when off NPO status. 8. DM - per primary  Plan - hold HD, too unstable, primary has called CCM to see.     Vinson Moselleob Ayo Smoak MD [facility] pager [phone]    cell [phone] 05/20/2016, 8:53 AM  Recent Labs Lab 05/15/16 0344  05/18/16 0022 05/18/16 0719 05/20/16 0202  NA 135  < > 134* 134* 131*  K 3.8  < > 3.7 5.0 3.8  CL 91*  < > 96* 95* 90*  CO2 22  < > 22 20* 22  GLUCOSE 253*  < > 118* 91 115*  BUN 32*  < > 37* 41* 26*  CREATININE 6.04*  < > 7.76* 8.08* 6.02*  CALCIUM 8.5*  < > 7.4* 7.5* 8.2*  PHOS 7.4*  --   --   --  6.4*  < > = values in this interval not displayed.  Recent Labs Lab 05/15/16 0344 05/18/16 0022 05/20/16 0202  AST  --  46*  --   ALT  --  29  --   ALKPHOS  --  189*  --   BILITOT  --  1.4*  --   PROT  --  7.3  --   ALBUMIN 3.7 3.4* 3.3*    Recent Labs Lab 05/18/16 0022 05/18/16 0719 05/19/16 0201 05/20/16 0201  WBC 10.6* 8.9 9.1 8.1  NEUTROABS 6.6  --   --   --   HGB 11.6* 11.6* 11.5* 11.9*  HCT 34.2* 34.2* 34.9* 34.1*  MCV 84.9 84.9 85.5 84.6  PLT 224 222 240 260   Iron/TIBC/Ferritin/ %Sat    Component Value Date/Time   IRON 48 06/17/2012 1356   TIBC 218 (L) 06/17/2012 1356   FERRITIN 1,262 (H) 06/17/2012  1356   IRONPCTSAT 22 06/17/2012 1356

## 2016-05-20 NOTE — Progress Notes (Signed)
eLink Physician-Brief Progress Note Patient Name: Mary QuanRegina F Backes DOB: 03/07/1971 MRN: [id number]   Date of Service  05/20/2016  HPI/Events of Note  C/o back pruritis - uremic in etiology?  eICU Interventions  Eucerin cream to back now and BID.      Intervention Category Minor Interventions: Routine modifications to care plan (e.g. PRN medications for pain, fever)  Lanae Federer Eugene 05/20/2016, 8:17 PM

## 2016-05-20 NOTE — Progress Notes (Signed)
[id number]

## 2016-05-20 NOTE — Progress Notes (Signed)
eLink Physician-Brief Progress Note Patient Name: Mary QuanRegina F Arrellano DOB: Oct 07, 1970 MRN: [id number]   Date of Service  05/20/2016  HPI/Events of Note  Multiple issues: 1. Lactic Acid 3.0 >> 4.1. LVEF = 15% s/p NSTEMI. Cardiology unable to cath d/t lack of access. Currently on Dopamine IV infusion. COOX pending and 2. Troponin 2.24 >> 2.0. Currently on a Heparin IV infusion.   eICU Interventions  Await results of COOX. Keep MAP >= 65 with Dopamine IV infusion.      Intervention Category Intermediate Interventions: Diagnostic test evaluation  [name] 05/20/2016, 6:06 PM

## 2016-05-20 NOTE — Progress Notes (Signed)
  Echocardiogram 2D Echocardiogram with Definity has been performed.  [name] 05/20/2016, 11:50 AM

## 2016-05-20 NOTE — Progress Notes (Signed)
CRITICAL VALUE ALERT  Critical value received:  Lactic 4.1 Troponin 2.00  Date of notification: 05/20/16  Time of notification:  1630  Critical value read back:yes  Nurse who received alert: Nelly LaurenceNatalie Lashunda Greis  MD notified (1st page):  Sommers

## 2016-05-20 NOTE — Procedures (Signed)
Central Venous Catheter Insertion Procedure Note [name] [id number] [birth year]  Procedure: Insertion of Central Venous Catheter Indications: Assessment of intravascular volume  Procedure Details Consent: Risks of procedure as well as the alternatives and risks of each were explained to the (patient/caregiver).  Consent for procedure obtained. Time Out: Verified patient identification, verified procedure, site/side was marked, verified correct patient position, special equipment/implants available, medications/allergies/relevent history reviewed, required imaging and test results available.  Performed  Maximum sterile technique was used including antiseptics, cap, gloves, gown, hand hygiene, mask and sheet. Skin prep: Chlorhexidine; local anesthetic administered A antimicrobial bonded/coated triple lumen catheter was placed in the left internal jugular vein using the Seldinger technique. Catheter placed to 20 cm. Blood aspirated via all 3 ports and then flushed x 3. Line sutured x 2 and dressing applied.  Ultrasound guidance used.Yes.    Evaluation Blood flow good Complications: No apparent complications Patient did tolerate procedure well. Chest X-ray ordered to verify placement.  CXR: pending.  Joneen RoachPaul Thomas Mabry, AGACNP-BC [facility] Pager [phone] or [phone]  05/20/2016 12:40 PM

## 2016-05-20 NOTE — Progress Notes (Signed)
Unable to do heart cath due to lack of access.  Per cards notes ECHO showed new severe LV dysfunction. Is on pressors now, up 4kg w/o signs of pulm edema.  If decompensates will need temp cath and CRRT.  Otherwise have postponed HD today until tomorrow.    Vinson Moselleob Sueellen Kayes MD [facility] pager [phone]    cell [phone] 05/20/2016, 4:11 PM

## 2016-05-20 NOTE — Care Management Note (Signed)
Case Management Note  Patient Details  Name: [name] MRN: [id number] Date of Birth: [birth year]  Subjective/Objective:           Adm w mi         Action/Plan: lives w fam, chart states has aid w reliable home care. Has medicare and medicaid.  Expected Discharge Date:                  Expected Discharge Plan:  Home w Home Health Services  In-House Referral:     Discharge planning Services  CM Consult, Medication Assistance  Post Acute Care Choice:    Choice offered to:     DME Arranged:    DME Agency:     HH Arranged[facility]  [facility]     Status of Service:  In process, will continue to follow  If discussed at Long Length of Stay Meetings, dates discussed:    Additional Comments: left pt 30day free brilinta card. Should have around 3.00 per month copay for brilinta w medicaid.  Hanley Haysowell, Kathrynne Kulinski T, RN 05/20/2016, 11:00 AM

## 2016-05-20 NOTE — Progress Notes (Signed)
Patient received 250 NS bolus as ordered.  Cardiology was called and requested stat EKG.  EKG was done.  Patient sleepy but arousable.  Patient complaining of back pain.  BP did not rebound with first NS bolus, (60's/50's) so 2nd 250 bolus was initiated.  MD and Rapid Response nurse were present in patient's room.  Cardiology also requested patient's morning medications be held.  Dialysis was made aware of patient's condition.

## 2016-05-20 NOTE — Progress Notes (Addendum)
PROGRESS NOTE                                                                                                                                                                                                             Patient Demographics:    [name], is a 45 y.o. female, DOB - 1971-01-13, ZOX:[id number]  Admit date - 05/17/2016   Admitting Physician Alberteen Sam, MD  Outpatient Primary MD for the patient is Billee Cashing, MD  LOS - 2  Chief Complaint  Patient presents with  . Back Pain  . Nasal Congestion       Brief Narrative   45 y.o. female with a past medical history significant for ESRD on HD MWF, bilateral AKA amputee, IDDM, CAD s/p DES in 2016, HTN, and hypotension on midodrine who presents with persistent nausea and fatigue, Patient had similar presentation in the past, worked and she was diagnosed with non-STEMI, where they require cardiac cath with PCI, patient with elevated troponins on admission, admitted for NSTEMI, seen by cardiology,on heparin GTT, possible cath today.   Subjective:    [name] has hypotension, sbp in the 60's and 70's, she is oriented, report some back pain, report poor appetite    Assessment  & Plan :    Principal Problem:   NSTEMI manifesting as nausea/fatigue Active Problems:   ESRD on dialysis   Insulin dependent diabetes mellitus with complications (HCC)   Anemia   CAD S/P CFX DES 02/01/15   Essential hypertension   Hypotension, unspecified   Hypotension: Patient has h/o chronic hypotension, sbp around 90's and low 100's, however, started from 9/19-9/20 night, her sbp around 60-70's, patient report feeling tired, no appetite. No diarrhea, no fever,  Unclear etiology, patient does not look septic, hypotension possibly combination of low cardiac output in the setting of NSTEMI and dehydration. She is on brilinta and heparin drip, but no acute anemia, will hold off CT ab for  retroperitoneal hemorraage.  D/c lopressor, ns 500cc bolus in renal patient with bilateral amputee, stat EKG, am cortisol level,  Will continue cycle troponin and check lactic acid. I have discussed the case with nephrology Dr Arlean Hopping and intensive care Dr Henrene Pastor  Over the phone, I have paged cardiology and waiting for call back Possible holding HD today, cardiology to decide about cardiac cath  NSTEMI with nausea/fatigue as anginal equivalent:  - Cardiology input greatly appreciated, and they suspect this has occurred on September 13, as it resembles her previous admission with non-STEMI. - Management per cardiology, continue with aspirin,Ticagrilor and statin - continue with heparin GTT - for cardiac cath if possible today, otherwise will be tomorrow today or tomorrow  ESRD on HD MWF:  - renal consulted  IDDM: - Hold home 70/30, cont With insulin sliding scale   CAD:  - Continue aspirin 81 mg, statin, ticagrelor for now - Hold amlodipine while on midodrine  Back pain:  - May continue Norco and Robaxin PRN for pain  Other medications: - Continue Reglan TID     Code Status : Full  Family Communication  : D/W patient  Disposition Plan  : pending further work up.  Consults  :  Renal, cardiology, critical care  Procedures  : none  DVT Prophylaxis  :  On heaprin GTT  Lab Results  Component Value Date   PLT 260 05/20/2016    Antibiotics  :    Anti-infectives    None        Objective:   Vitals:   05/20/16 0700 05/20/16 0753 05/20/16 0811 05/20/16 0812  BP: (!) 75/50 (!) 75/50 (!) 66/53 (!) 73/57  Pulse: 68 66 67 68  Resp:      Temp:      TempSrc:      SpO2: 100% 100% 98% 98%  Weight:        Wt Readings from Last 3 Encounters:  05/20/16 76.5 kg (168 lb 10.4 oz)  05/15/16 79.9 kg (176 lb 2.4 oz)  10/31/15 68.9 kg (152 lb)     Intake/Output Summary (Last 24 hours) at 05/20/16 0816 Last data filed at 05/19/16 1230  Gross per 24 hour  Intake               240 ml  Output                0 ml  Net              240 ml     Physical Exam  Lethargic, Oriented X 3,  Supple Neck,No JVD,  Symmetrical Chest wall movement, Good air movement bilaterally, CTAB RRR,No Gallops,Rubs or new Murmurs, No Parasternal Heave +ve B.Sounds, Abd Soft, No tenderness. B/L AKA    Data Review:    CBC  Recent Labs Lab 05/16/16 0319 05/18/16 0022 05/18/16 0719 05/19/16 0201 05/20/16 0201  WBC 13.6* 10.6* 8.9 9.1 8.1  HGB 12.6 11.6* 11.6* 11.5* 11.9*  HCT 37.9 34.2* 34.2* 34.9* 34.1*  PLT 211 224 222 240 260  MCV 86.3 84.9 84.9 85.5 84.6  MCH 28.7 28.8 28.8 28.2 29.5  MCHC 33.2 33.9 33.9 33.0 34.9  RDW 17.9* 18.5* 18.6* 18.2* 18.4*  LYMPHSABS  --  3.0  --   --   --   MONOABS  --  0.9  --   --   --   EOSABS  --  0.0  --   --   --   BASOSABS  --  0.0  --   --   --     Chemistries   Recent Labs Lab 05/15/16 0344 05/16/16 0319 05/18/16 0022 05/18/16 0719 05/20/16 0202  NA 135 136 134* 134* 131*  K 3.8 4.0 3.7 5.0 3.8  CL 91* 93* 96* 95* 90*  CO2 22 21* 22 20* 22  GLUCOSE 253* 178* 118* 91 115*  BUN  32* 19 37* 41* 26*  CREATININE 6.04* 4.58* 7.76* 8.08* 6.02*  CALCIUM 8.5* 9.1 7.4* 7.5* 8.2*  AST  --   --  46*  --   --   ALT  --   --  29  --   --   ALKPHOS  --   --  189*  --   --   BILITOT  --   --  1.4*  --   --    ------------------------------------------------------------------------------------------------------------------ No results for input(s): CHOL, HDL, LDLCALC, TRIG, CHOLHDL, LDLDIRECT in the last 72 hours.  Lab Results  Component Value Date   HGBA1C 7.1 (H) 02/01/2015   ------------------------------------------------------------------------------------------------------------------ No results for input(s): TSH, T4TOTAL, T3FREE, THYROIDAB in the last 72 hours.  Invalid input(s): FREET3 ------------------------------------------------------------------------------------------------------------------ No  results for input(s): VITAMINB12, FOLATE, FERRITIN, TIBC, IRON, RETICCTPCT in the last 72 hours.  Coagulation profile  Recent Labs Lab 05/20/16 0201  INR 1.27    No results for input(s): DDIMER in the last 72 hours.  Cardiac Enzymes  Recent Labs Lab 05/18/16 0719 05/18/16 1832  TROPONINI 3.38* 2.76*   ------------------------------------------------------------------------------------------------------------------ No results found for: BNP  Inpatient Medications  Scheduled Meds: . aspirin  81 mg Oral Daily  . atorvastatin  40 mg Oral q1800  . calcitRIOL  1.5 mcg Oral Q M,W,F-HD  . cinacalcet  60 mg Oral BID WC  . insulin aspart  0-9 Units Subcutaneous TID WC  . methocarbamol  500 mg Oral BID  . metoCLOPramide  5 mg Oral TID AC  . multivitamin  1 tablet Oral QHS  . sevelamer carbonate  2,400 mg Oral TID WC  . sodium chloride  250 mL Intravenous Once  . ticagrelor  90 mg Oral BID   Continuous Infusions: . heparin 1,000 Units/hr (05/20/16 0208)   PRN Meds:.diphenhydrAMINE, HYDROcodone-acetaminophen, nitroGLYCERIN, ondansetron (ZOFRAN) IV, zolpidem  Micro Results Recent Results (from the past 240 hour(s))  Culture, blood (routine x 2)     Status: None (Preliminary result)   Collection Time: 05/15/16 12:05 AM  Result Value Ref Range Status   Specimen Description BLOOD RIGHT HAND  Final   Special Requests BOTTLES DRAWN AEROBIC AND ANAEROBIC  Final   Culture NO GROWTH 4 DAYS  Final   Report Status PENDING  Incomplete  Culture, blood (routine x 2)     Status: None (Preliminary result)   Collection Time: 05/15/16 12:20 AM  Result Value Ref Range Status   Specimen Description BLOOD LEFT HAND  Final   Special Requests IN PEDIATRIC BOTTLE  Final   Culture NO GROWTH 4 DAYS  Final   Report Status PENDING  Incomplete  MRSA PCR Screening     Status: None   Collection Time: 05/15/16  2:09 AM  Result Value Ref Range Status   MRSA by PCR NEGATIVE NEGATIVE Final     Comment:        The GeneXpert MRSA Assay (FDA approved for NASAL specimens only), is one component of a comprehensive MRSA colonization surveillance program. It is not intended to diagnose MRSA infection nor to guide or monitor treatment for MRSA infections.     Radiology Reports Dg Chest Portable 1 View  Result Date: 05/14/2016 CLINICAL DATA:  Weakness, nausea and vomiting for 2 days. EXAM: PORTABLE CHEST 1 VIEW COMPARISON:  09/08/2015 FINDINGS: Unchanged moderate cardiomegaly. The lungs are clear. The pulmonary vasculature is normal. There is no large effusion. IMPRESSION: Stable cardiomegaly.  No consolidation or effusion. Electronically Signed   By: Ellery Plunk  M.D.   On: 05/14/2016 22:13   Dg Abd Acute W/chest  Result Date: 05/18/2016 CLINICAL DATA:  Acute onset of upper back pain and vomiting. Initial encounter. EXAM: DG ABDOMEN ACUTE W/ 1V CHEST COMPARISON:  Chest radiograph performed 05/14/2016, and CT of the abdomen and pelvis from 09/08/2015 FINDINGS: The lungs are well-aerated. Minimal bibasilar atelectasis is noted. There is no evidence of pleural effusion or pneumothorax. The cardiomediastinal silhouette is enlarged. The visualized bowel gas pattern is unremarkable. Scattered stool and air are seen within the colon; there is no evidence of small bowel dilatation to suggest obstruction. No free intra-abdominal air is identified on the provided upright view. No acute osseous abnormalities are seen; the sacroiliac joints are unremarkable in appearance. Small calcified fibroids are noted overlying the pelvis. Scattered vascular calcifications are seen. IMPRESSION: 1. Unremarkable bowel gas pattern; no free intra-abdominal air seen. Small to moderate amount of stool noted in the colon. 2. Minimal bibasilar atelectasis noted.  Cardiomegaly. 3. Small calcified uterine fibroids again noted. 4. Scattered vascular calcifications seen. Electronically Signed   By: Roanna Raider M.D.   On:  05/18/2016 01:08     Byard Carranza M.D PhD on 05/20/2016 at 8:16 AM  Between 7am to 7pm - Pager - [phone]  After 7pm go to [email or web address] - password [facility]  [facility] -  Office  [phone]

## 2016-05-20 NOTE — Significant Event (Signed)
Rapid Response Event Note  Overview: Time Called: 0750 Arrival Time: 0753 Event Type: Hypotension  Initial Focused Assessment: Patient sitting upright in the bed, resting comfortably intermittent sever lower right back pain.  Neuro intact. Appears very sleep at times, but then wakes up and interacts with daughter normally. Some apnea when sleepy.  BP 60-70s bilat arms.  SR 60s,  RR 20  O2 sat 98%  Lung sounds decreased bases Dr Roda ShuttersXu at bedside to assess patient.  Daughter at bedside.  Correlated BP on both arms and with doppler  Interventions: 1L NS bolus, no change in BP remains 60-70s. 12 lead EKG done Labs done Dr Arlean HoppingSchertz at bedside to assess patient 2nd Liter NS infusing Vernona RiegerLaura Ingold/cards at bedside, orders received for Dopamine gtt Dopamine gtt started at 310mcg/kg  Via PIV BP improved to 88/64  HR 68 Dr Kendrick FriesMcQuaid at bedside to assess patient. Patient [facility] to [id number]  Plan of Care (if not transferred):  Event Summary: Name of Physician Notified: Roda ShuttersXu at [id number]  Name of Consulting Physician Notified: Dr Tresa EndoKelly via phone, Dr Durenda GuthrieSchurtz at bedside at 0800  Outcome: Transferred (Comment) (2h10)  Event End Time: 1000  Marcellina MillinLayton, Kenyata Guess

## 2016-05-20 NOTE — Consult Note (Signed)
PULMONARY / CRITICAL CARE MEDICINE   Name: [name] MRN: [id number] DOB: 1971/02/23    ADMISSION DATE:  05/17/2016 CONSULTATION DATE:  05/20/2016  REFERRING MD:  Roda Shutters  CHIEF COMPLAINT:  Low blood pressure  HISTORY OF PRESENT ILLNESS:   45 y/o female with a past medical history of ESRD, coronary artery disease, diabetes mellitus, peripheral vascular disease who was admitted on 05/17/2016 to [facility]. She was admitted on 05/17/2016 with fatigue after a recent brief observation. Just a few days prior. Troponin was checked and was found to be markedly elevated at 4.31. Of note, in the past when she's had multiple cardiac events she's never had chest pain.  In the last several weeks she's been treated for hypotension with a new dose of midodrine.  Pulmonary and critical care medicine was consulted on 05/20/2016 because of persistent hypotension. Specifically, she said blood pressures in the systolic 60 range overnight on September 19 through early morning September 20. She was given IV fluid by the primary service but unfortunately her blood pressure did not improve.  She denies fevers, chills or cough. She denies any belly pain, nausea, vomiting, or diarrhea. She was started on a heparin drip because of her non-ST elevation MI and a hemoglobin was checked this morning and was normal.    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  PAST SURGICAL HISTORY: She  has a past surgical history that includes AV fistula placement (10/18/2008); Finger debridement (06/20/2010); Finger amputation (05/27/2010); Thrombectomy / arteriovenous  graft revision (01/14/2010); Above knee leg amputaton (11/08/2009); Leg amputation below knee (10/11/2009); Central venous catheter insertion (08/06/2009); Above knee leg amputaton (05/21/2009); Foot Amputation (04/04/2009); Excision / curettage bone cyst talus / calcaneus (03/04/2009); Groin debridement (11/16/2008); Femoral endarterectomy (11/06/2008); Central venous catheter insertion (07/10/2008; 12/31/2006; 01/19/2006; 06/19/2005; 09/19/2004; 09/08/2004); AV fistula repair (07/08/2008); Thrombectomy / arteriovenous graft revision (05/11/2007;12/31/2006; 01/18/2006; 12/16/2005); Dialysis fistula creation (01/20/2007); Dialysis fistula creation (01/28/2006); Thrombectomy / arteriovenous graft revision (10/26/2005); Dialysis fistula creation (10/04/2005); AV fistula repair (07/14/2005); Thrombectomy / arteriovenous graft revision (03/11/2005; 10/20/2004); Dialysis fistula creation (09/10/2004); AV fistula placement (07/18/2004); Finger exploration (07/13/2002); Carpal tunnel release (01/26/2012); Esophagogastroduodenoscopy (06/26/2012); shuntogram (N/A, 11/12/2011); Cardiac catheterization (N/A, 02/01/2015); Cardiac catheterization (N/A, 02/01/2015); Thrombectomy and revision of arterioventous (av) goretex  graft (Left, 04/24/2012); and Revision of arteriovenous goretex graft (Left, 10/01/2015).  Allergies  Allergen Reactions  . Percocet [Oxycodone-Acetaminophen] Other (See Comments)    GI UPSET  . Flagyl [Metronidazole] Other (See Comments)  . Soap Itching    IVORY SOAP  . Tramadol Itching    No current facility-administered medications on file prior to encounter.    Current Outpatient Prescriptions on File Prior to Encounter  Medication Sig  . aspirin 81 MG chewable tablet Chew 81 mg by mouth daily.  Marland Kitchen atorvastatin (LIPITOR) 40 MG tablet Take 1 tablet (40 mg total) by mouth daily at 6 PM.  . cetirizine (ZYRTEC) 10 MG tablet Take 10 mg by mouth daily as needed for allergies.   . cinacalcet (SENSIPAR) 30 MG tablet Take 60 mg by mouth  daily.  Marland Kitchen HYDROcodone-acetaminophen (NORCO/VICODIN) 5-325 MG tablet Take 2 tablets by mouth every 4 (four) hours as needed. (Patient taking differently: Take 2 tablets by  mouth every 4 (four) hours as needed for moderate pain. )  . insulin aspart protamine-insulin aspart (NOVOLOG 70/30) (70-30) 100 UNIT/ML injection Inject 6-10 Units into the skin 2 (two) times daily with a meal. Take 10 units in the morning and 6 units in the evening  . methocarbamol (ROBAXIN) 500 MG tablet Take 1 tablet (500 mg total) by mouth 2 (two) times daily.  . metoCLOPramide (REGLAN) 5 MG tablet Take 5 mg by mouth 3 (three) times daily before meals.  . midodrine (PROAMATINE) 10 MG tablet Take 10 mg by mouth 3 (three) times daily.  Marland Kitchen. NOVOLIN 70/30 (70-30) 100 UNIT/ML injection Inject 10-12 Units into the skin 2 (two) times daily.  . sevelamer (RENVELA) 800 MG tablet Take 2,400 mg by mouth 3 (three) times daily with meals.   . ticagrelor (BRILINTA) 90 MG TABS tablet Take 1 tablet (90 mg total) by mouth 2 (two) times daily.    FAMILY HISTORY:  Her indicated that her mother is deceased. She indicated that her father is deceased. She indicated that all of her three sisters are alive. She indicated that both of her brothers are alive.    SOCIAL HISTORY: She  reports that she has never smoked. She has never used smokeless tobacco. She reports that she does not drink alcohol or use drugs.  REVIEW OF SYSTEMS:   Gen: Denies fever, chills, weight change, fatigue, night sweats HEENT: Denies blurred vision, double vision, hearing loss, tinnitus, sinus congestion, rhinorrhea, sore throat, neck stiffness, dysphagia PULM: Denies shortness of breath, cough, sputum production, hemoptysis, wheezing CV: Denies chest pain, edema, orthopnea, paroxysmal nocturnal dyspnea, palpitations GI: Denies abdominal pain, nausea, vomiting, diarrhea, hematochezia, melena, constipation, change in bowel habits GU: Denies dysuria, hematuria, polyuria,  oliguria, urethral discharge Endocrine: Denies hot or cold intolerance, polyuria, polyphagia or appetite change Derm: Denies rash, dry skin, scaling or peeling skin change Heme: Denies easy bruising, bleeding, bleeding gums Neuro: Denies headache, numbness, + weakness, slurred speech, loss of memory or consciousness   SUBJECTIVE:  As above  VITAL SIGNS: BP (!) 65/47 (BP Location: Left Arm)   Pulse 68   Temp (!) 96.4 F (35.8 C) (Rectal)   Resp 20   Wt 76.5 kg (168 lb 10.4 oz)   LMP 05/14/2005 Comment: no period since dialysis in 2006  SpO2 98%   BMI 29.88 kg/m   HEMODYNAMICS:    VENTILATOR SETTINGS: FiO2 (%):  [2 %] 2 %  INTAKE / OUTPUT: I/O last 3 completed shifts: In: 380 [P.O.:380] Out: -   PHYSICAL EXAMINATION: General:  Pleasant female, lying comfortably in bed Neuro:  Drowsy, but speech is clear and coherent, moves all 4 extremities well HEENT:  Normocephalic atraumatic, mucous members are moist Cardiovascular:  Regular rate and rhythm, slight systolic murmur Lungs:  Clear to auscultation bilaterally, normal effort Abdomen:  Bowel sounds positive, nontender nondistended Musculoskeletal:  Status post bilateral AKA Skin:  No rash or skin breakdown  LABS:  BMET  Recent Labs Lab 05/18/16 0022 05/18/16 0719 05/20/16 0202  NA 134* 134* 131*  K 3.7 5.0 3.8  CL 96* 95* 90*  CO2 22 20* 22  BUN 37* 41* 26*  CREATININE 7.76* 8.08* 6.02*  GLUCOSE 118* 91 115*    Electrolytes  Recent Labs Lab 05/15/16 0344  05/18/16 0022 05/18/16 0719 05/20/16 0202  CALCIUM 8.5*  < > 7.4* 7.5* 8.2*  PHOS 7.4*  --   --   --  6.4*  < > = values in this interval  not displayed.  CBC  Recent Labs Lab 05/19/16 0201 05/20/16 0201 05/20/16 0855  WBC 9.1 8.1 8.0  HGB 11.5* 11.9* 11.6*  HCT 34.9* 34.1* 34.5*  PLT 240 260 251    Coag's  Recent Labs Lab 05/20/16 0201  INR 1.27    Sepsis Markers  Recent Labs Lab 05/15/16 1506 05/16/16 0319 05/20/16 0855   LATICACIDVEN 2.9* 3.1* 3.0*    ABG No results for input(s): PHART, PCO2ART, PO2ART in the last 168 hours.  Liver Enzymes  Recent Labs Lab 05/15/16 0344 05/18/16 0022 05/20/16 0202  AST  --  46*  --   ALT  --  29  --   ALKPHOS  --  189*  --   BILITOT  --  1.4*  --   ALBUMIN 3.7 3.4* 3.3*    Cardiac Enzymes  Recent Labs Lab 05/18/16 0719 05/18/16 1832 05/20/16 0855  TROPONINI 3.38* 2.76* 2.24*    Glucose  Recent Labs Lab 05/19/16 0343 05/19/16 1111 05/19/16 1615 05/19/16 2041 05/20/16 0454 05/20/16 0618  GLUCAP 136* 211* 179* 155* 125* 136*    Imaging No results found.   STUDIES:    CULTURES: None  ANTIBIOTICS: None  SIGNIFICANT EVENTS: 05/17/2016 admitted for non-ST elevation MI 05/20/2016 pulmonary and critical care medicine consulted for management of hypertension  LINES/TUBES: None  DISCUSSION: 45 year old female with a past medical history significant for coronary artery disease, baseline chronic hypotension, and end-stage renal disease was admitted on 05/17/2016 with a non-ST elevation MI. She has developed worsening hypotension overnight from September 19 through early morning 05/20/2016 of uncertain etiology. She does not have any findings on exam to suggest hypovolemia, sepsis, or bleeding. Her hemoglobin is stable. The most likely consideration hears at this is a cardiac mediated cause of hypertension.  ASSESSMENT / PLAN:  PULMONARY A: No acute issues P:   Monitor respiratory status  CARDIOVASCULAR A:  Shock: As above, worrisome for cardiac cause Non-ST elevation MI Peripheral vascular disease P:  Transferred ICU stat Start dopamine Evaluate for central line as it would be ideal to check CVP and mixed venous O2  RENAL A:   End-stage renal disease P:   Per renal  GASTROINTESTINAL A:   No acute issues P:   Nothing by mouth  HEMATOLOGIC A:   No acute issues P:  Heparin per pharmacy/cardiology  INFECTIOUS A:    No acute issues P:   Monitor for fever  ENDOCRINE A:   Diabetes mellitus type 2   P:   Continue sliding scale insulin  NEUROLOGIC A:   Lethargy, likely related to hypotension, did not receive any sedating medicines P:   Monitor, if no improvement after blood pressure treatment, consider head imaging   FAMILY  - Updates: Her daughter was updated at bedside by me on 05/20/2016  My critical care time 35 minutes  Heber , MD  PCCM Pager: [phone] Cell: [phone] After 3pm or if no response, call [phone]   05/20/2016, 9:59 AM

## 2016-05-20 NOTE — Progress Notes (Signed)
Subjective: Only complains of Rt back pain and nurses state she is more lethargic than usual.    Objective: Vital signs in last 24 hours: Temp:  [96.4 F (35.8 C)-98.7 F (37.1 C)] 96.4 F (35.8 C) (09/20 0832) Pulse Rate:  [66-92] 92 (09/20 0909) Resp:  [16-20] 20 (09/20 0832) BP: (56-106)/(22-76) 65/50 (09/20 0909) SpO2:  [97 %-100 %] 97 % (09/20 0857) Weight:  [168 lb 10.4 oz (76.5 kg)] 168 lb 10.4 oz (76.5 kg) (09/20 0436) Weight change: 10.6 oz (0.3 kg) Last BM Date: 05/19/16 Intake/Output from previous day: 09/19 0701 - 09/20 0700 In: 380 [P.O.:380] Out: -  Intake/Output this shift: No intake/output data recorded.  PE: General:Pleasant affect, only complains of rt side pain, she has this at home  Skin:Warm and dry, brisk capillary refill HEENT:normocephalic, sclera clear, mucus membranes moist Neck:supple, no JVD  Heart:S1S2 RRR without murmur, gallup, rub or click Lungs:clear without rales, rhonchi, or wheezes ZOX:WRUE, non tender, + BS, do not palpate liver spleen or masses Ext:Bil BKA 2+ radial pulses Neuro:alert and oriented X 3, MAE, follows commands, + facial symmetry  Lab Results:  Recent Labs  05/20/16 0201 05/20/16 0855  WBC 8.1 8.0  HGB 11.9* 11.6*  HCT 34.1* 34.5*  PLT 260 251   BMET  Recent Labs  05/18/16 0719 05/20/16 0202  NA 134* 131*  K 5.0 3.8  CL 95* 90*  CO2 20* 22  GLUCOSE 91 115*  BUN 41* 26*  CREATININE 8.08* 6.02*  CALCIUM 7.5* 8.2*    Recent Labs  05/18/16 1832 05/20/16 0855  TROPONINI 2.76* 2.24*    Lab Results  Component Value Date   CHOL 121 (L) 09/10/2015   HDL 34 (L) 09/10/2015   LDLCALC 63 09/10/2015   TRIG 118 09/10/2015   CHOLHDL 3.6 09/10/2015   Lab Results  Component Value Date   HGBA1C 7.1 (H) 02/01/2015     Lab Results  Component Value Date   TSH 2.358 02/01/2015    Hepatic Function Panel  Recent Labs  05/18/16 0022 05/20/16 0202  PROT 7.3  --   ALBUMIN 3.4* 3.3*  AST  46*  --   ALT 29  --   ALKPHOS 189*  --   BILITOT 1.4*  --    No results for input(s): CHOL in the last 72 hours. No results for input(s): PROTIME in the last 72 hours.     Studies/Results: No results found.  Medications: I have reviewed the patient's current medications. Scheduled Meds: . aspirin  81 mg Oral Daily  . atorvastatin  40 mg Oral q1800  . calcitRIOL  1.5 mcg Oral Q M,W,F-HD  . cinacalcet  60 mg Oral BID WC  . insulin aspart  0-9 Units Subcutaneous TID WC  . methocarbamol  500 mg Oral BID  . metoCLOPramide  5 mg Oral TID AC  . multivitamin  1 tablet Oral QHS  . sevelamer carbonate  2,400 mg Oral TID WC  . sodium chloride  250 mL Intravenous Once  . sodium chloride  250 mL Intravenous Once  . ticagrelor  90 mg Oral BID   Continuous Infusions: . heparin 1,000 Units/hr (05/20/16 0208)   PRN Meds:.sodium chloride, diphenhydrAMINE, HYDROcodone-acetaminophen, nitroGLYCERIN, ondansetron (ZOFRAN) IV, zolpidem  Assessment/Plan: Principal Problem:   NSTEMI manifesting as nausea/fatigue,  pk troponin 4.31, on IV heparin.  For cath tomorrow.   Cath  last one Rt femoral artery.   now with hypotension -  EKG  with ant depression reviewed with Dr. Tresa EndoKelly, pt without pain and no symptoms or when she was admitted.  Hypotension ? Cardiogenic shock will get stat Echo IV fluids without improvement of BP Lactic acid 3.0   To transfer to ICU   Active Problems:   ESRD on dialysis MWF- holding dialysis with hypotension   Insulin dependent diabetes mellitus with complications (HCC)   Anemia- stable    CAD S/P CFX DES 02/01/15   Essential hypertension, now 90s to 109 systolic    Hypotension, unspecified    LOS: 2 days   Nada BoozerLaura Ingold  Nurse Practitioner Certified Pager [phone] or after 5pm and on weekends call [phone] 05/20/2016, 9:46 AM   Patient seen and examined. Agree with assessment and plan. Events of this am noted. Pt had c/o right back ache. BP this am dropped to  mid 60's systolic.No chest pain or nausea, vomiting, abdominal apin or significant dyspnea. ECG is essentially unchanged and show NSR at 66 with mild diffuse ST changes; QTC 543.  She was started on dopamine, and given iv fluids.We have transferred her from 2W to CCU. BP now 98 - 110/80. Sinus tachycardia. A stat echo is being performed. Preliminary review by me reveals severe LV dysfunction with EF ~ 15% suggesting cardiogenic shock. She has been on heparin. A definity contrast study suggests focal apical thrombus. Of note, EF 02/01/2015 was normal. Pt is for cath this am, I have called cath lab to arrange for R and L heart cath. Pt has ESRD last dialysis was on Monday and was to be done later today, now on hold. Pt also had been taking midodrine  3 x week ? PRN on dialysis days and this was started 2-3 weeks ago at the outpatient HD unit.   Time spent 40 minutes.  Lennette Biharihomas A. Neiva Maenza, MD, [facility]ison HospitalFACC 05/20/2016 11:30 AM

## 2016-05-20 NOTE — Progress Notes (Signed)
ANTICOAGULATION CONSULT NOTE  Pharmacy Consult for Heparin Indication: chest pain/ACS  Allergies  Allergen Reactions  . Percocet [Oxycodone-Acetaminophen] Other (See Comments)    GI UPSET  . Flagyl [Metronidazole] Other (See Comments)  . Soap Itching    IVORY SOAP  . Tramadol Itching    Patient Measurements: Weight: 168 lb 10.4 oz (76.5 kg) Heparin Dosing Weight: 70 kg  Vital Signs: Temp: 97.7 F (36.5 C) (09/20 0436) Temp Source: Oral (09/20 0436) BP: 73/57 (09/20 0812) Pulse Rate: 68 (09/20 0812)  Labs:  Recent Labs  05/18/16 0022 05/18/16 0719  05/18/16 1832 05/19/16 0201 05/20/16 0201 05/20/16 0202  HGB 11.6* 11.6*  --   --  11.5* 11.9*  --   HCT 34.2* 34.2*  --   --  34.9* 34.1*  --   PLT 224 222  --   --  240 260  --   LABPROT  --   --   --   --   --  16.0*  --   INR  --   --   --   --   --  1.27  --   HEPARINUNFRC  --   --   < > 0.54 0.38 0.54  --   CREATININE 7.76* 8.08*  --   --   --   --  6.02*  TROPONINI  --  3.38*  --  2.76*  --   --   --   < > = values in this interval not displayed.  Estimated Creatinine Clearance: 11.4 mL/min (by C-G formula based on SCr of 6.02 mg/dL (H)).   Assessment: 45 y.o. female started on IV heparin for r/o ACS. Troponin elevated. Not on anticoagulation PTA. Heparin level remains therapeutic this AM (0.54). No bleeding or line issues noted. Hg low stable, plt wnl. Possible cath today per Cards note.  Goal of Therapy:  Heparin level 0.3-0.7 units/ml Monitor platelets by anticoagulation protocol: Yes   Plan:  - Continue heparin gtt 1000 units/hr - Daily heparin level and CBC - Monitor for s/sx bleeding - Possible cath today  Babs BertinHaley Sylva Overley, PharmD, BCPS Clinical Pharmacist Pager [phone] 05/20/2016 8:20 AM

## 2016-05-20 NOTE — Progress Notes (Signed)
Patient was started on dopamine as ordered and begun on 2nd liter of NS bolus.  Patient will [facility] as per MD orders.

## 2016-05-20 NOTE — Progress Notes (Addendum)
On arrival, it was reported that pt was experiencing low blood pressures since 0430 am.  Blood pressure was repeated at 0753 and noted to be 75/50, HR 66.  Pt was arousable and oriented x4 but very sleepy.  Spoke with physician and Rapid Response was called at 0755.

## 2016-05-20 NOTE — Progress Notes (Signed)
Prior to my shift the last BP was 89/62 while on mine the first BP was 89/63. Appeared typical for the patient to me. Later in the shift (this am) the BP dropped and I paged the Physician on call. I didn't receive any order. Patient was asymptomatic to me because I spoke to her after I took the blood pressure. I contacted the charge nurse too and we took the manual blood pressure together.

## 2016-05-21 DIAGNOSIS — Z7189 Other specified counseling: Secondary | ICD-10-CM

## 2016-05-21 DIAGNOSIS — I513 Intracardiac thrombosis, not elsewhere classified: Secondary | ICD-10-CM

## 2016-05-21 DIAGNOSIS — I213 ST elevation (STEMI) myocardial infarction of unspecified site: Secondary | ICD-10-CM

## 2016-05-21 LAB — CBC
HEMATOCRIT: 36.9 % (ref 36.0–46.0)
HEMOGLOBIN: 12.5 g/dL (ref 12.0–15.0)
MCH: 28.8 pg (ref 26.0–34.0)
MCHC: 33.9 g/dL (ref 30.0–36.0)
MCV: 85 fL (ref 78.0–100.0)
Platelets: 195 10*3/uL (ref 150–400)
RBC: 4.34 MIL/uL (ref 3.87–5.11)
RDW: 18 % — AB (ref 11.5–15.5)
WBC: 14.3 10*3/uL — AB (ref 4.0–10.5)

## 2016-05-21 LAB — CARBOXYHEMOGLOBIN
CARBOXYHEMOGLOBIN: 0.8 % (ref 0.5–1.5)
Carboxyhemoglobin: 0.5 % (ref 0.5–1.5)
Carboxyhemoglobin: 1.5 % (ref 0.5–1.5)
METHEMOGLOBIN: 1.1 % (ref 0.0–1.5)
Methemoglobin: 1.2 % (ref 0.0–1.5)
Methemoglobin: 1 % (ref 0.0–1.5)
O2 SAT: 39.4 %
O2 Saturation: 42.4 %
O2 Saturation: 56.6 %
TOTAL HEMOGLOBIN: 11.8 g/dL — AB (ref 12.0–16.0)
Total hemoglobin: 12.2 g/dL (ref 12.0–16.0)
Total hemoglobin: 12.3 g/dL (ref 12.0–16.0)

## 2016-05-21 LAB — TROPONIN I
TROPONIN I: 2.64 ng/mL — AB (ref <0.03)
TROPONIN I: 2.65 ng/mL — AB (ref <0.03)
Troponin I: 2.61 ng/mL (ref <0.03)
Troponin I: 2.12 ng/mL (ref <0.03)

## 2016-05-21 LAB — RENAL FUNCTION PANEL
ALBUMIN: 3 g/dL — AB (ref 3.5–5.0)
Anion gap: 17 — ABNORMAL HIGH (ref 5–15)
BUN: 34 mg/dL — ABNORMAL HIGH (ref 6–20)
CALCIUM: 7.1 mg/dL — AB (ref 8.9–10.3)
CO2: 21 mmol/L — AB (ref 22–32)
CREATININE: 7.18 mg/dL — AB (ref 0.44–1.00)
Chloride: 96 mmol/L — ABNORMAL LOW (ref 101–111)
GFR, EST AFRICAN AMERICAN: 7 mL/min — AB (ref >60)
GFR, EST NON AFRICAN AMERICAN: 6 mL/min — AB (ref >60)
Glucose, Bld: 164 mg/dL — ABNORMAL HIGH (ref 65–99)
Phosphorus: 6.9 mg/dL — ABNORMAL HIGH (ref 2.5–4.6)
Potassium: 4.1 mmol/L (ref 3.5–5.1)
SODIUM: 134 mmol/L — AB (ref 135–145)

## 2016-05-21 LAB — GLUCOSE, CAPILLARY
GLUCOSE-CAPILLARY: 123 mg/dL — AB (ref 65–99)
Glucose-Capillary: 173 mg/dL — ABNORMAL HIGH (ref 65–99)
Glucose-Capillary: 142 mg/dL — ABNORMAL HIGH (ref 65–99)
Glucose-Capillary: 103 mg/dL — ABNORMAL HIGH (ref 65–99)

## 2016-05-21 LAB — HEPARIN LEVEL (UNFRACTIONATED): Heparin Unfractionated: 0.7 IU/mL (ref 0.30–0.70)

## 2016-05-21 LAB — TSH: TSH: 1.214 u[IU]/mL (ref 0.350–4.500)

## 2016-05-21 MED ORDER — HEPARIN SODIUM (PORCINE) 1000 UNIT/ML DIALYSIS: 2000.0000 [IU] | INTRAMUSCULAR | Status: DC | PRN

## 2016-05-21 MED ORDER — SODIUM CHLORIDE 0.9% FLUSH: 10.0000 mL | INTRAVENOUS | Status: DC | PRN

## 2016-05-21 MED ORDER — WARFARIN SODIUM 7.5 MG PO TABS
7.5000 mg | ORAL_TABLET | Freq: Once | ORAL | Status: AC
  Administered 2016-05-21: 7.5 mg via ORAL
  Filled 2016-05-21: qty 1

## 2016-05-21 MED ORDER — DIGOXIN 125 MCG PO TABS
0.0625 mg | ORAL_TABLET | ORAL | Status: DC
  Administered 2016-05-21 – 2016-05-23 (×2): 0.0625 mg via ORAL
  Filled 2016-05-21 (×2): qty 1

## 2016-05-21 MED ORDER — WARFARIN - PHARMACIST DOSING INPATIENT: Freq: Every day | Status: DC

## 2016-05-21 MED ORDER — DOBUTAMINE IN D5W 4-5 MG/ML-% IV SOLN
2.5000 ug/kg/min | INTRAVENOUS | Status: DC
  Administered 2016-05-21: 2.5 ug/kg/min via INTRAVENOUS
  Filled 2016-05-21: qty 250

## 2016-05-21 MED ORDER — PROPOFOL 1000 MG/100ML IV EMUL
INTRAVENOUS | Status: AC
  Filled 2016-05-21: qty 100

## 2016-05-21 MED ORDER — SODIUM CHLORIDE 0.9% FLUSH
10.0000 mL | Freq: Two times a day (BID) | INTRAVENOUS | Status: DC
  Administered 2016-05-21 – 2016-05-25 (×7): 10 mL

## 2016-05-21 NOTE — Progress Notes (Signed)
eLink Physician-Brief Progress Note Patient Name: Mary QuanRegina F Oetken DOB: 20-Dec-1970 MRN: [id number]   Date of Service  05/21/2016  HPI/Events of Note  Cardiogenic Shock - COOX = 42. Cardiology unable to obtain vascular access, therefore, could not do cardiac cath. Few, if any, good options.Discussed with Cardiology who wants to try Dobutamine IV infusion.   eICU Interventions  Will order: 1. Dobutamine IV infusion. Titrate to 5 to 7.5  mcg/kg/min. 2. Repeat COOX at 6 PM.      Intervention Category Major Interventions: Acid-Base disturbance - evaluation and management  Lenell AntuSommer,Steven Eugene 05/21/2016, 4:35 PM

## 2016-05-21 NOTE — Progress Notes (Addendum)
Subjective:  No chest pain;  Unable to perform cath yesterday due to access limitation  Objective:   Vital Signs : Vitals:   05/21/16 0500 05/21/16 0600 05/21/16 0700 05/21/16 0800  BP: 98/81 98/80 104/84 100/77  Pulse:      Resp:   16 (!) 21  Temp:    97.5 F (36.4 C)  TempSrc:    Oral  SpO2:      Weight: 175 lb 4.3 oz (79.5 kg)       Intake/Output from previous day:  Intake/Output Summary (Last 24 hours) at 05/21/16 0824 Last data filed at 05/21/16 0800  Gross per 24 hour  Intake          1020.43 ml  Output                0 ml  Net          1020.43 ml    I/O since admission: +410  Wt Readings from Last 3 Encounters:  05/21/16 175 lb 4.3 oz (79.5 kg)  05/15/16 176 lb 2.4 oz (79.9 kg)  10/31/15 152 lb (68.9 kg)    Medications: . aspirin  81 mg Oral Daily  . atorvastatin  40 mg Oral q1800  . calcitRIOL  1.5 mcg Oral Q M,W,F-HD  . cinacalcet  60 mg Oral BID WC  . hydrocerin   Topical BID  . insulin aspart  0-5 Units Subcutaneous QHS  . insulin aspart  0-9 Units Subcutaneous TID WC  . methocarbamol  500 mg Oral BID  . metoCLOPramide  5 mg Oral TID AC  . multivitamin  1 tablet Oral QHS  . sevelamer carbonate  2,400 mg Oral TID WC  . sodium chloride  250 mL Intravenous Once  . sodium chloride  250 mL Intravenous Once  . ticagrelor  90 mg Oral BID    . DOPamine 6.972 mcg/kg/min (05/21/16 0800)  . heparin 900 Units/hr (05/21/16 0800)    Physical Exam:   General appearance: alert, cooperative and no distress Neck: no adenopathy, supple, symmetrical, trachea midline and thyroid not enlarged, symmetric, no tenderness/mass/nodules Lungs: slightly decreased BS without rales Heart: RRR 1/6 sem; no rub Abdomen: soft, non-tender; bowel sounds normal; no masses,  no organomegaly Extremities: bilateral AKA Neurologic: Grossly normal   Rate: 76  Rhythm: normal sinus rhythm  ECG (independently read by me): NSR at 74  Lab Results:   Recent Labs   05/20/16 0202  NA 131*  K 3.8  CL 90*  CO2 22  GLUCOSE 115*  BUN 26*  CREATININE 6.02*  CALCIUM 8.2*  PHOS 6.4*    Hepatic Function Latest Ref Rng & Units 05/20/2016 05/18/2016 05/15/2016  Total Protein 6.5 - 8.1 g/dL - 7.3 -  Albumin 3.5 - 5.0 g/dL 3.3(L) 3.4(L) 3.7  AST 15 - 41 U/L - 46(H) -  ALT 14 - 54 U/L - 29 -  Alk Phosphatase 38 - 126 U/L - 189(H) -  Total Bilirubin 0.3 - 1.2 mg/dL - 1.4(H) -  Bilirubin, Direct 0.0 - 0.3 mg/dL - - -     Recent Labs  05/20/16 0201 05/20/16 0855 05/21/16 0500  WBC 8.1 8.0 14.3*  HGB 11.9* 11.6* 12.5  HCT 34.1* 34.5* 36.9  MCV 84.6 85.8 85.0  PLT 260 251 195     Recent Labs  05/20/16 1455 05/20/16 2225 05/21/16 0255  TROPONINI 2.00* 2.29* 2.64*    Lab Results  Component Value Date   TSH 2.358 02/01/2015   No results  for input(s): HGBA1C in the last 72 hours.   Recent Labs  05/20/16 0202  ALBUMIN 3.3*    Recent Labs  05/20/16 0201  INR 1.27   BNP (last 3 results) No results for input(s): BNP in the last 8760 hours.  ProBNP (last 3 results) No results for input(s): PROBNP in the last 8760 hours.   Lipid Panel     Component Value Date/Time   CHOL 121 (L) 09/10/2015 0902   TRIG 118 09/10/2015 0902   HDL 34 (L) 09/10/2015 0902   CHOLHDL 3.6 09/10/2015 0902   VLDL 24 09/10/2015 0902   LDLCALC 63 09/10/2015 0902    Co-Ox 46.4% down from 82%  7 days  Imaging:  Dg Chest Port 1 View  Result Date: 05/20/2016 CLINICAL DATA:  Central line placement EXAM: PORTABLE CHEST 1 VIEW COMPARISON:  05/17/2016 FINDINGS: Left internal jugular central line has its tip in the SVC at the azygos level. No pneumothorax. The heart is enlarged. There is pulmonary venous hypertension. There is patchy density at the lung bases left worse than right that could be atelectasis or basilar pneumonia. No effusions. IMPRESSION: Central line tip in the SVC at the azygos level.  No pneumothorax. Probable fluid overload/interstitial edema.  Patchy density at the lung bases left more than right could be atelectasis or pneumonia. Electronically Signed   By: Nelson Chimes M.D.   On: 05/20/2016 13:00   ------------------------------------------------------------------- Study Conclusions  - Left ventricle: The cavity size was normal. Wall thickness was   normal. Systolic function was moderately to severely reduced. The   estimated ejection fraction was in the range of 15% to 20%.   Akinesis of the mid-apicalanteroseptal and apical myocardium. - Mitral valve: Moderately calcified annulus. Mildly thickened   leaflets . There was mild regurgitation. - Right ventricle: The cavity size was mildly dilated. Systolic   function was moderately reduced. - Pulmonary arteries: Systolic pressure was moderately increased.   PA peak pressure: 42 mm Hg (S).   Definity contrast revealed severe LV dysfunction. There is an apical thrombus    Assessment/Plan:   Principal Problem:   NSTEMI manifesting as nausea/fatigue Active Problems:   ESRD on dialysis   Insulin dependent diabetes mellitus with complications ([place])   Anemia   CAD S/P CFX DES 02/01/15   Essential hypertension   Hypotension, unspecified   Cardiogenic shock ([place])  1. Cardiomyopathy;  EF in 2016 was nl; EF 15% yesterday with apical thrombus. 2. Cardiogenic shock; BP improved today to 95 - 100/76 on dopamine at 6.972 ug/k/min. Will try to wean as BP allows. Troponins with flat profile c/w demand ischemia. 3. CAD ; s/p PCI to Lcx 6/16 4. ESRD:  For dialysis today 5. PVD; s/p B AKA 6. DM:  7. Anticoagulation: to begin warfarin today with apical thrombus.  Will continue ASA 81 and dc ticagrelor. Continue heparin until therapeutic. 8. Hyperlipidemia: on atorvastatin 40 mg  Unable to perform cath yesterday. Due to access issues.  Will add digoxin at 0.0625 mg every 48 hrs. If BP allows once dopamine is off consider low dose enalapril 2.5 mg but will not start until dopamine is off.  For dialysis today. Re check Co-Ox today. Discussed at length with patient and family members.   Time spent: 40 minutes Troy Sine, MD, [facility] 05/21/2016, 8:24 AM

## 2016-05-21 NOTE — Progress Notes (Addendum)
ANTICOAGULATION CONSULT NOTE - Initial Consult  Pharmacy Consult for Coumadin Indication: Apical Thrombus  Allergies  Allergen Reactions  . Percocet [Oxycodone-Acetaminophen] Other (See Comments)    GI UPSET  . Flagyl [Metronidazole] Other (See Comments)  . Soap Itching    IVORY SOAP  . Tramadol Itching    Patient Measurements: Weight: 175 lb 4.3 oz (79.5 kg) Heparin Dosing Weight: N/A  Vital Signs: Temp: 97.3 F (36.3 C) (09/21 1235) Temp Source: Oral (09/21 1235) BP: 107/82 (09/21 1330) Pulse Rate: 104 (09/21 1330)  Labs:  Recent Labs  05/19/16 0201 05/20/16 0201 05/20/16 0202 05/20/16 0855  05/20/16 2225 05/21/16 0255 05/21/16 0500 05/21/16 0930  HGB 11.5* 11.9*  --  11.6*  --   --   --  12.5  --   HCT 34.9* 34.1*  --  34.5*  --   --   --  36.9  --   PLT 240 260  --  251  --   --   --  195  --   LABPROT  --  16.0*  --   --   --   --   --   --   --   INR  --  1.27  --   --   --   --   --   --   --   HEPARINUNFRC 0.38 0.54  --   --   --   --   --  0.70  --   CREATININE  --   --  6.02*  --   --   --   --   --  7.18*  TROPONINI  --   --   --  2.24*  < > 2.29* 2.64*  --  2.61*  < > = values in this interval not displayed.  Estimated Creatinine Clearance: 9.8 mL/min (by C-G formula based on SCr of 7.18 mg/dL (H)).   Medical History: Past Medical History:  Diagnosis Date  . Coronary artery disease 01/2015   single vessel disease CFX, stented 02/01/2015  . Diabetes mellitus    IDDM  . ESRF (end stage renal failure) (HCC)    dialysis M,W, F; left thigh AV graft  . GERD (gastroesophageal reflux disease)   . High cholesterol   . History of gangrene    left foot  . Hx MRSA infection   . Hypertension    states has been on med. x "a long time"  . Impaired mobility    bilateral AKA, is in a wheelchair  . 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 Myocardial infarction (HCC)    " slight heart attack on 01/31/15  . No natural teeth    does not wear  dentures  . S/P bilateral above knee amputation (HCC)     Medications:  . DOPamine 10 mcg/kg/min (05/21/16 1234)  . heparin 900 Units/hr (05/21/16 1200)    Assessment: 45 yoF started on heparin for r/o ACS. Cath performed on 9/20 shows new apical thrombus. Currently on 900 U/hr of Hep with HL of 0.7. Pt to transition to Coumadin. INR on 9/20 was 1.27 and patient not on any anticoagulation PTA. CBC stable. No S/Sx of bleeding.   Goal of Therapy:  INR goal 2-3   Plan:  1. Initiate Coumadin 7.5 mg po x 1. 2. Continue Heparin 900 Units/hr until INR therapeutic x 2 3. Monitor INR daily 4. Monitor CBC, S/Sx bleeding daily   Adline Potter 05/21/2016,1:46 PM  I have reviewed the PharmD candidate's recommendations and agree with plan. Will order warfarin and INR as above.  Sherron MondayAubrey N. Lilou Kneip, PharmD Clinical Pharmacy Resident Pager: [phone] 05/21/16 2:20 PM

## 2016-05-21 NOTE — Progress Notes (Signed)
    Dr. Arsenio LoaderSommer called cardiology to discuss case with Dr. Tresa EndoKelly who is now off for the afternoon. Her Co-ox is now 42%. She is currently on Dopamine. I discussed case with Dr. Shirlee LatchMcLean with the advanced heart failure team who was familiar with the patient, but has not formally seen. He felt palliative care was the best course of action but that we could try dobutamine to try to get her co-ox up. Discussed with Dr. Arsenio LoaderSommer who will talk with patient's family and consult palliative care.    Cline CrockKathryn Merrilee Ancona PA-C  [facility]

## 2016-05-21 NOTE — Progress Notes (Signed)
Inpatient Diabetes Program Recommendations  AACE/ADA: New Consensus Statement on Inpatient Glycemic Control (2015)  Target Ranges:  Prepandial:   less than 140 mg/dL      Peak postprandial:   less than 180 mg/dL (1-2 hours)      Critically ill patients:  140 - 180 mg/dL   Lab Results  Component Value Date   GLUCAP 142 (H) 05/21/2016   HGBA1C 7.1 (H) 02/01/2015    Review of Glycemic Control  Diabetes history: DM2 Outpatient Diabetes medications: 70/30 insulin 10 units ac breakfast + 6 units ac dinner Current orders for Inpatient glycemic control: Novolog correction 0-9 units tid + 0-5 units hs  Inpatient Diabetes Program Recommendations:    Please consider basal 50% home dose ( 11.2 units basal based on 70% of 16 units)= approx. 6 units Levemir daily. Will follow.  Thank you, Mary FischerJudy E. Ziasia Lenoir, RN, MSN, CDE Inpatient Glycemic Control Team Team Pager [phone] (8am-5pm) 05/21/2016 12:53 PM

## 2016-05-21 NOTE — Progress Notes (Signed)
PULMONARY / CRITICAL CARE MEDICINE   Name: [name] MRN: [id number] DOB: Jan 03, 1971    ADMISSION DATE:  05/17/2016 CONSULTATION DATE:  05/20/2016  REFERRING MD:  Roda ShuttersXu  CHIEF COMPLAINT:  Low blood pressure  SUBJECTIVE:  No distress. Now awake.   VITAL SIGNS: BP 100/77 (BP Location: Right Arm)   Pulse (!) 102   Temp 97.5 F (36.4 C) (Oral)   Resp (!) 21   Wt 175 lb 4.3 oz (79.5 kg)   LMP 05/14/2005 Comment: no period since dialysis in 2006  SpO2 98%   BMI 31.05 kg/m   HEMODYNAMICS: CVP:  [15 mmHg-21 mmHg] 15 mmHg  VENTILATOR SETTINGS: FiO2 (%):  [2 %] 2 %  INTAKE / OUTPUT: I/O last 3 completed shifts: In: 1001.1 [I.V.:1001.1] Out: -   PHYSICAL EXAMINATION: General:  Pleasant female, lying comfortably in bed, now awake. No distress.  Neuro:  Fully awake, speech is clear and coherent, moves all 4 extremities well HEENT:  Normocephalic atraumatic, mucous members are moist Cardiovascular:  Regular rate and rhythm, slight systolic murmur Lungs:  Crackles bases. No accessory use  Abdomen:  Bowel sounds positive, nontender nondistended Musculoskeletal:  Status post bilateral AKA Skin:  No rash or skin breakdown  LABS:  BMET  Recent Labs Lab 05/18/16 0022 05/18/16 0719 05/20/16 0202  NA 134* 134* 131*  K 3.7 5.0 3.8  CL 96* 95* 90*  CO2 22 20* 22  BUN 37* 41* 26*  CREATININE 7.76* 8.08* 6.02*  GLUCOSE 118* 91 115*    Electrolytes  Recent Labs Lab 05/15/16 0344  05/18/16 0022 05/18/16 0719 05/20/16 0202  CALCIUM 8.5*  < > 7.4* 7.5* 8.2*  PHOS 7.4*  --   --   --  6.4*  < > = values in this interval not displayed.  CBC  Recent Labs Lab 05/20/16 0201 05/20/16 0855 05/21/16 0500  WBC 8.1 8.0 14.3*  HGB 11.9* 11.6* 12.5  HCT 34.1* 34.5* 36.9  PLT 260 251 195    Coag's  Recent Labs Lab 05/20/16 0201  INR 1.27    Sepsis Markers  Recent Labs Lab 05/16/16 0319 05/20/16 0855 05/20/16 1621  LATICACIDVEN 3.1* 3.0* 4.1*     ABG No results for input(s): PHART, PCO2ART, PO2ART in the last 168 hours.  Liver Enzymes  Recent Labs Lab 05/15/16 0344 05/18/16 0022 05/20/16 0202  AST  --  46*  --   ALT  --  29  --   ALKPHOS  --  189*  --   BILITOT  --  1.4*  --   ALBUMIN 3.7 3.4* 3.3*    Cardiac Enzymes  Recent Labs Lab 05/20/16 1455 05/20/16 2225 05/21/16 0255  TROPONINI 2.00* 2.29* 2.64*    Glucose  Recent Labs Lab 05/20/16 0454 05/20/16 0618 05/20/16 1153 05/20/16 1624 05/20/16 1959 05/21/16 0806  GLUCAP 125* 136* 190* 184* 223* 173*    Imaging Dg Chest Port 1 View  Result Date: 05/20/2016 CLINICAL DATA:  Central line placement EXAM: PORTABLE CHEST 1 VIEW COMPARISON:  05/17/2016 FINDINGS: Left internal jugular central line has its tip in the SVC at the azygos level. No pneumothorax. The heart is enlarged. There is pulmonary venous hypertension. There is patchy density at the lung bases left worse than right that could be atelectasis or basilar pneumonia. No effusions. IMPRESSION: Central line tip in the SVC at the azygos level.  No pneumothorax. Probable fluid overload/interstitial edema. Patchy density at the lung bases left more than right could be atelectasis or  pneumonia. Electronically Signed   By: Paulina Fusi M.D.   On: 05/20/2016 13:00     STUDIES:  ECHO 9/20: Systolic function was moderately to severely reduced. The   estimated ejection fraction was in the range of 15% to 20%. Akinesis of the mid-apicalanteroseptal and apical myocardium. - Mitral valve: Moderately calcified annulus. Mildly thickened   leaflets . There was mild regurgitation. - Right ventricle: The cavity size was mildly dilated. Systolic   function was moderately reduced. - Pulmonary arteries: Systolic pressure was moderately increased. PA peak pressure: 42 mm Hg (S).  CULTURES: None  ANTIBIOTICS: None  SIGNIFICANT EVENTS: 05/17/2016 admitted for non-ST elevation MI 05/20/2016 pulmonary and  critical care medicine consulted for management of hypertension  LINES/TUBES: None  DISCUSSION: 45 year old female with a past medical history significant for coronary artery disease, baseline chronic hypotension, and end-stage renal disease was admitted on 05/17/2016 with a non-ST elevation MI, cardiogenic shock, and new ICM. She is not a candidate for cardiac cath d/t vascular anatomy. For today plan is to try to wean dopamine off for goal SBP >95. Otherwise we will cont supportive care and hope that there will be some improvement in her cardiac fxn w/ time.   ASSESSMENT / PLAN:  PULMONARY A: No acute issues P:   Monitor respiratory status  CARDIOVASCULAR A:  Non-ST elevation MI h/o known CAD w/ prior stent to Lcx ICM w/ EF now 15% Cardiogenic shock Apical thrombus Peripheral vascular disease w/ prior bilateral BKA P:  Cont anticoagulation per cards Digoxin per cards Wean dopamine for SBP > 95 Cont lipitor  Volume removal per HD Not a candidate for c-cath d/t vascular anatomy   RENAL A:   End-stage renal disease P:   Per renal  GASTROINTESTINAL A:   No acute issues P:   Nothing by mouth  HEMATOLOGIC A:   No acute issues P:  Heparin per pharmacy/cardiology  INFECTIOUS A:   No acute issues P:   Monitor for fever  ENDOCRINE A:   Diabetes mellitus type 2   P:   Continue sliding scale insulin  NEUROLOGIC A:   Lethargy, likely related to hypotension, did not receive any sedating medicines-->resolved P:   Hold sedation    FAMILY  - Updates: Her daughter was updated at bedside by me on 05/20/2016  Simonne Martinet ACNP-BC [facility] Pulmonary/Critical Care Pager # [phone] OR # [phone] if no answer  05/21/2016, 9:24 AM  Denies chest pain, dyspnea.  Remains on dopamine gtt.  Alert.  HR regular.  No wheeze.  Abd soft.  B/l AKA.  Troponin 2.64, WBC 14.3  CXR - ATX  Echo - EF 15 to 20%, PAS 42 mmHg  Assessment/plan:  NSTEMI with cardiogenic  shock. Acute on chronic systolic CHF. - wean DA - f/u with cardiology  ESRD. - per renal   DM 2. - SSI  Coralyn Helling, MD [facility] Pulmonary/Critical Care 05/21/2016, 10:47 AM Pager:  [phone] After 3pm call: [phone]

## 2016-05-21 NOTE — Progress Notes (Signed)
[facility] Progress Note   Subjective: went to cath lab, unable to do cath due to limitation in arterial access. On dopamine gtt, more alert today, feeling better.  BP 100 / 70.  No sob or cough or CP.   Vitals:   05/21/16 0500 05/21/16 0600 05/21/16 0700 05/21/16 0800  BP: 98/81 98/80 104/84 100/77  Pulse:      Resp:   16 (!) 21  Temp:    97.5 F (36.4 C)  TempSrc:    Oral  SpO2:      Weight: 79.5 kg (175 lb 4.3 oz)       Inpatient medications: . aspirin  81 mg Oral Daily  . atorvastatin  40 mg Oral q1800  . calcitRIOL  1.5 mcg Oral Q M,W,F-HD  . cinacalcet  60 mg Oral BID WC  . hydrocerin   Topical BID  . insulin aspart  0-5 Units Subcutaneous QHS  . insulin aspart  0-9 Units Subcutaneous TID WC  . methocarbamol  500 mg Oral BID  . metoCLOPramide  5 mg Oral TID AC  . multivitamin  1 tablet Oral QHS  . sevelamer carbonate  2,400 mg Oral TID WC  . sodium chloride  250 mL Intravenous Once  . sodium chloride  250 mL Intravenous Once  . ticagrelor  90 mg Oral BID   . DOPamine 6.972 mcg/kg/min (05/21/16 0800)  . heparin 900 Units/hr (05/21/16 0800)   sodium chloride, sodium chloride, alteplase, diphenhydrAMINE, heparin, HYDROcodone-acetaminophen, lidocaine (PF), lidocaine-prilocaine, nitroGLYCERIN, ondansetron (ZOFRAN) IV, pentafluoroprop-tetrafluoroeth, zolpidem  Exam: General: sleepy but arouses easily, Ox 3 Head: NCAT sclera not icteric MMM Neck: Supple.  Lungs: faint rales R base, L clear Heart: RRR with S1 S2.  Abdomen: soft NT + BS Lower extremities: bilateral AKAs; tr-1+ edema; right thigh no sig edeam Neuro: A & O  X 3. Moves all extremities spontaneously. Psych:  Responds to questions appropriately with a normal affect. Dialysis Access:left thigh AVGG   Dialysis Orders: MWF [facility]  4h  75kg   2/2 bath  P4   Hep 6000   L thigh AVG Mircera 50 q 2 - last 9/13 no Fe calcitriol 1.5  Recent labs:  hgb 11.7 9/13 19% sat was 21- didn't go up much after  a full course of Fe- iPTH 2498 Setp, 859 Aug and 2400 in July  ++Last intervention on thigh graft was 8/10.  AF chronically low Renvela 3 ac and sensipar 30 bid - per pt - this is different from outpt med list  Assessment/Plan: 1. Hypotension - cardiogenic shock, EF yest 15%, no cath d/t arterial disease, on DA gtt 2. NSTEMI - hx PTCA and stent 01/2015, old inf MI by echo last yr 3. ESRD -  MWF HD. Plan for HD.  4. Volume - +4kg today, asymptomatic 5. Anemia  - hgb 11.6 - last mircera 50 on 9/13 - no ESA needed for now;  6. Metabolic bone disease - continue calcitriol/renvela/increase sensipar to 60 bid - outpt dose- since her iPTH is so high - need to continue this after discharge 7. Nutrition - renal carb mod/vit when off NPO status. 8. DM - per primary  Plan - HD today in ICU, UF 2-2.5 kg as Caryl Pinatol   Rob Jane Broughton MD [facility] pager [phone]    cell [phone] 05/21/2016, 8:48 AM    Recent Labs Lab 05/15/16 0344  05/18/16 0022 05/18/16 0719 05/20/16 0202  NA 135  < > 134* 134* 131*  K  3.8  < > 3.7 5.0 3.8  CL 91*  < > 96* 95* 90*  CO2 22  < > 22 20* 22  GLUCOSE 253*  < > 118* 91 115*  BUN 32*  < > 37* 41* 26*  CREATININE 6.04*  < > 7.76* 8.08* 6.02*  CALCIUM 8.5*  < > 7.4* 7.5* 8.2*  PHOS 7.4*  --   --   --  6.4*  < > = values in this interval not displayed.  Recent Labs Lab 05/15/16 0344 05/18/16 0022 05/20/16 0202  AST  --  46*  --   ALT  --  29  --   ALKPHOS  --  189*  --   BILITOT  --  1.4*  --   PROT  --  7.3  --   ALBUMIN 3.7 3.4* 3.3*    Recent Labs Lab 05/18/16 0022  05/20/16 0201 05/20/16 0855 05/21/16 0500  WBC 10.6*  < > 8.1 8.0 14.3*  NEUTROABS 6.6  --   --   --   --   HGB 11.6*  < > 11.9* 11.6* 12.5  HCT 34.2*  < > 34.1* 34.5* 36.9  MCV 84.9  < > 84.6 85.8 85.0  PLT 224  < > 260 251 195  < > = values in this interval not displayed. Iron/TIBC/Ferritin/ %Sat    Component Value Date/Time   IRON 48 06/17/2012 1356   TIBC  218 (L) 06/17/2012 1356   FERRITIN 1,262 (H) 06/17/2012 1356   IRONPCTSAT 22 06/17/2012 1356

## 2016-05-21 NOTE — Progress Notes (Signed)
ANTICOAGULATION CONSULT FOLLOW-UP NOTE  Pharmacy Consult for Heparin Indication: chest pain/ACS  Allergies  Allergen Reactions  . Percocet [Oxycodone-Acetaminophen] Other (See Comments)    GI UPSET  . Flagyl [Metronidazole] Other (See Comments)  . Soap Itching    IVORY SOAP  . Tramadol Itching    Patient Measurements: Weight: 175 lb 4.3 oz (79.5 kg) Heparin Dosing Weight: 70 kg  Vital Signs: Temp: 97.2 F (36.2 C) (09/21 0300) Temp Source: Axillary (09/21 0300) BP: 98/80 (09/21 0600) Pulse Rate: 102 (09/21 0400)  Labs:  Recent Labs  05/18/16 0719  05/19/16 0201 05/20/16 0201 05/20/16 0202 05/20/16 0855 05/20/16 1455 05/20/16 2225 05/21/16 0255 05/21/16 0500  HGB 11.6*  --  11.5* 11.9*  --  11.6*  --   --   --  12.5  HCT 34.2*  --  34.9* 34.1*  --  34.5*  --   --   --  36.9  PLT 222  --  240 260  --  251  --   --   --  195  LABPROT  --   --   --  16.0*  --   --   --   --   --   --   INR  --   --   --  1.27  --   --   --   --   --   --   HEPARINUNFRC  --   < > 0.38 0.54  --   --   --   --   --  0.70  CREATININE 8.08*  --   --   --  6.02*  --   --   --   --   --   TROPONINI 3.38*  < >  --   --   --  2.24* 2.00* 2.29* 2.64*  --   < > = values in this interval not displayed.  Estimated Creatinine Clearance: 11.7 mL/min (by C-G formula based on SCr of 6.02 mg/dL (H)).   Assessment: 45 yoF started on heparin for r/o ACS. Cath lab cancelled 9/20 due to lack of access. Of note pt on HD MWF but missed session on 9/20 due to bradycardia. Heparin level therapeutic at 0.70 but has been trending up. Will decrease dose slightly and recheck in morning. Hgb and plt stable, no S/Sx bleeding noted.  Goal of Therapy:  Heparin level 0.3-0.7 units/ml Monitor platelets by anticoagulation protocol: Yes   Plan:  -Decrease heparin gtt to 900 units/hr -Daily heparin level and CBC -Monitor for s/sx bleeding -Follow-up plans for repeat cath attempt  Fredonia HighlandMichael Bitonti, PharmD PGY-1  Pharmacy Resident Pager: [phone] 05/21/2016

## 2016-05-21 NOTE — Progress Notes (Signed)
eLink Physician-Brief Progress Note Patient Name: Mary QuanRegina F Zimmermann DOB: [birth year] MRN: [id number]   Date of Service  05/21/2016  HPI/Events of Note  Cardiogenic Shock - COOX = 42.4 >> 56.6 on Dobutamine at 5 mcg/kg/min and Dopamine IV infusion.   eICU Interventions  Will order: 1. Increase Dobutamine IV infusion to 7.5 mcg/kg/min. 2. Recheck COOX at 12 midnight.      Intervention Category Major Interventions: Shock - evaluation and management  Sherea Liptak Eugene 05/21/2016, 10:54 PM

## 2016-05-22 LAB — CARBOXYHEMOGLOBIN
Carboxyhemoglobin: 1 % (ref 0.5–1.5)
Carboxyhemoglobin: 1.8 % — ABNORMAL HIGH (ref 0.5–1.5)
METHEMOGLOBIN: 1 % (ref 0.0–1.5)
Methemoglobin: 1 % (ref 0.0–1.5)
O2 SAT: 64.7 %
O2 Saturation: 70.7 %
Total hemoglobin: 10.8 g/dL — ABNORMAL LOW (ref 12.0–16.0)
Total hemoglobin: 12.2 g/dL (ref 12.0–16.0)

## 2016-05-22 LAB — TROPONIN I
TROPONIN I: 1.69 ng/mL — AB (ref <0.03)
TROPONIN I: 1.45 ng/mL — AB (ref <0.03)
Troponin I: 1.26 ng/mL (ref <0.03)

## 2016-05-22 LAB — CBC
HEMATOCRIT: 32.5 % — AB (ref 36.0–46.0)
HEMOGLOBIN: 10.8 g/dL — AB (ref 12.0–15.0)
MCH: 28.2 pg (ref 26.0–34.0)
MCHC: 33.2 g/dL (ref 30.0–36.0)
MCV: 84.9 fL (ref 78.0–100.0)
Platelets: 160 10*3/uL (ref 150–400)
RBC: 3.83 MIL/uL — AB (ref 3.87–5.11)
RDW: 18 % — ABNORMAL HIGH (ref 11.5–15.5)
WBC: 10.6 10*3/uL — AB (ref 4.0–10.5)

## 2016-05-22 LAB — COMPREHENSIVE METABOLIC PANEL
ALBUMIN: 3 g/dL — AB (ref 3.5–5.0)
ALT: 33 U/L (ref 14–54)
AST: 61 U/L — AB (ref 15–41)
Alkaline Phosphatase: 169 U/L — ABNORMAL HIGH (ref 38–126)
Anion gap: 30 — ABNORMAL HIGH (ref 5–15)
BILIRUBIN TOTAL: 2.7 mg/dL — AB (ref 0.3–1.2)
BUN: 32 mg/dL — AB (ref 6–20)
CHLORIDE: 91 mmol/L — AB (ref 101–111)
CO2: 11 mmol/L — ABNORMAL LOW (ref 22–32)
CREATININE: 7.42 mg/dL — AB (ref 0.44–1.00)
Calcium: 6.9 mg/dL — ABNORMAL LOW (ref 8.9–10.3)
GFR calc Af Amer: 7 mL/min — ABNORMAL LOW (ref >60)
GFR calc non Af Amer: 6 mL/min — ABNORMAL LOW (ref >60)
GLUCOSE: 302 mg/dL — AB (ref 65–99)
POTASSIUM: 4 mmol/L (ref 3.5–5.1)
Sodium: 132 mmol/L — ABNORMAL LOW (ref 135–145)
TOTAL PROTEIN: 7 g/dL (ref 6.5–8.1)

## 2016-05-22 LAB — GLUCOSE, CAPILLARY
GLUCOSE-CAPILLARY: 273 mg/dL — AB (ref 65–99)
GLUCOSE-CAPILLARY: 237 mg/dL — AB (ref 65–99)
Glucose-Capillary: 305 mg/dL — ABNORMAL HIGH (ref 65–99)

## 2016-05-22 LAB — HEPARIN LEVEL (UNFRACTIONATED): HEPARIN UNFRACTIONATED: 0.62 [IU]/mL (ref 0.30–0.70)

## 2016-05-22 LAB — PROTIME-INR
INR: 1.8
PROTHROMBIN TIME: 21.1 s — AB (ref 11.4–15.2)

## 2016-05-22 MED ORDER — DOBUTAMINE IN D5W 4-5 MG/ML-% IV SOLN
7.5000 ug/kg/min | INTRAVENOUS | Status: DC
  Administered 2016-05-22 – 2016-05-23 (×2): 7.5 ug/kg/min via INTRAVENOUS
  Filled 2016-05-22 (×2): qty 250

## 2016-05-22 MED ORDER — HYDROXYZINE HCL 25 MG PO TABS
25.0000 mg | ORAL_TABLET | Freq: Three times a day (TID) | ORAL | Status: DC | PRN
  Administered 2016-05-22 – 2016-05-23 (×2): 25 mg via ORAL
  Filled 2016-05-22 (×2): qty 1

## 2016-05-22 MED ORDER — CINACALCET HCL 30 MG PO TABS
30.0000 mg | ORAL_TABLET | Freq: Every day | ORAL | Status: DC
  Administered 2016-05-23 – 2016-05-25 (×3): 30 mg via ORAL
  Filled 2016-05-22 (×3): qty 1

## 2016-05-22 MED ORDER — NOREPINEPHRINE BITARTRATE 1 MG/ML IV SOLN
0.0000 ug/min | INTRAVENOUS | Status: DC
  Administered 2016-05-22: 10 ug/min via INTRAVENOUS
  Filled 2016-05-22 (×2): qty 16

## 2016-05-22 MED ORDER — WARFARIN SODIUM 2.5 MG PO TABS
2.5000 mg | ORAL_TABLET | Freq: Once | ORAL | Status: AC
  Administered 2016-05-22: 2.5 mg via ORAL
  Filled 2016-05-22: qty 1

## 2016-05-22 MED ORDER — NOREPINEPHRINE BITARTRATE 1 MG/ML IV SOLN
0.0000 ug/min | INTRAVENOUS | Status: DC
  Administered 2016-05-22: 5 ug/min via INTRAVENOUS
  Administered 2016-05-22: 10 ug/min via INTRAVENOUS
  Filled 2016-05-22: qty 4

## 2016-05-22 NOTE — Progress Notes (Signed)
Inpatient Diabetes Program Recommendations  AACE/ADA: New Consensus Statement on Inpatient Glycemic Control (2015)  Target Ranges:  Prepandial:   less than 140 mg/dL      Peak postprandial:   less than 180 mg/dL (1-2 hours)      Critically ill patients:  140 - 180 mg/dL   Lab Results  Component Value Date   GLUCAP 305 (H) 05/22/2016   HGBA1C 7.1 (H) 02/01/2015    Review of Glycemic Control  Results for [name], [name] (MRN [id number]) as of 05/22/2016 14:51  Ref. Range 05/21/2016 12:10 05/21/2016 17:03 05/21/2016 20:28 05/22/2016 08:06 05/22/2016 11:37  Glucose-Capillary Latest Ref Range: 65 - 99 mg/dL 409142 (H) 811103 (H) 914123 (H) 273 (H) 305 (H)    Diabetes history: DM2 Outpatient Diabetes medications: 70/30 insulin 10 units ac breakfast + 6 units ac dinner  Current orders for Inpatient glycemic control: Novolog correction 0-9 units tid + 0-5 units hs  Inpatient Diabetes Program Recommendations:   Please consider basal 50% home dose ( 11.2 units basal based on 70% of 16 units)= approx. 6 units Levemir daily.   Consider increasing Novolog correction to the moderate correction scale.   Susette RacerJulie Jazlin Tapscott, RN, BA, MHA, CDE Diabetes Coordinator Inpatient Diabetes Program  [phone] (Team Pager) [phone] [facility] Office) 05/22/2016 2:51 PM

## 2016-05-22 NOTE — Progress Notes (Signed)
ANTICOAGULATION CONSULT NOTE - Follow Up Consult  Pharmacy Consult for Heparin and Coumadin Indication: Apical Thrombus  Allergies  Allergen Reactions  . Percocet [Oxycodone-Acetaminophen] Other (See Comments)    GI UPSET  . Flagyl [Metronidazole] Other (See Comments)  . Soap Itching    IVORY SOAP  . Tramadol Itching    Patient Measurements: Weight: 172 lb 2.9 oz (78.1 kg) Heparin Dosing Weight: 78.1  Vital Signs: Temp: 97.3 F (36.3 C) (09/22 0800) Temp Source: Axillary (09/22 0800) BP: 78/55 (09/22 0800) Pulse Rate: 99 (09/22 0800)  Labs:  Recent Labs  05/20/16 0201 05/20/16 0202 05/20/16 0855  05/21/16 0500 05/21/16 0930  05/21/16 2115 05/22/16 0215 05/22/16 0500 05/22/16 0855 05/22/16 0907  HGB 11.9*  --  11.6*  --  12.5  --   --   --   --  10.8*  --   --   HCT 34.1*  --  34.5*  --  36.9  --   --   --   --  32.5*  --   --   PLT 260  --  251  --  195  --   --   --   --  160  --   --   LABPROT 16.0*  --   --   --   --   --   --   --   --  21.1*  --   --   INR 1.27  --   --   --   --   --   --   --   --  1.80  --   --   HEPARINUNFRC 0.54  --   --   --  0.70  --   --   --   --  0.62  --   --   CREATININE  --  6.02*  --   --   --  7.18*  --   --   --   --   --  7.42*  TROPONINI  --   --  2.24*  < >  --  2.61*  < > 2.12* 1.69*  --  1.45*  --   < > = values in this interval not displayed.  Estimated Creatinine Clearance: 9.4 mL/min (by C-G formula based on SCr of 7.42 mg/dL (H)).   Medications:  . DOBUTamine 7.5 mcg/kg/min (05/22/16 0815)  . DOPamine 13 mcg/kg/min (05/22/16 1018)  . heparin 900 Units/hr (05/21/16 1839)    Assessment: 45 yoF started on heparin for r/o ACS. Cath on 9/20 showed new apical thrombus. Currently on 900 Units/hr of Heparin with heparin level of 0.62. Administered Coumadin 7.5 mg po x 1 yesterday with baseline INR of 1.27 (9/20). INR this morning was 1.8 which is a large increase with one dose. MD worried about hepatic congestion. The  patient not on any anticoagulation PTA. CBC stable. No S/Sx of bleeding.   Goal of Therapy:  INR goal 2-3   Plan:  1. Initiate Coumadin at a decreased dose of 2.5 mg po x 1. 2. Continue Heparin 900 Units/hr until INR therapeutic x 24 hrs. 3. Monitor INR and heparin level daily 4. Monitor CBC, S/Sx bleeding daily  Adline PotterSabrina Dunham 05/22/2016,10:22 AM  I discussed / reviewed the pharmacy note by Adline PotterSabrina Dunham, Pharm D Candidate and I agree with the resident's findings and plans as documented.  Tad MooreJessica Drisana Schweickert, Pharm D, BCPS  Clinical Pharmacist Pager [phone]  05/22/2016 11:44 AM

## 2016-05-22 NOTE — Progress Notes (Signed)
Subjective:  No chest pain;  Unable to perform cath  due to access limitation. Dialyzed yesterday; feels better today.  Objective:   Vital Signs : Vitals:   05/22/16 0300 05/22/16 0400 05/22/16 0500 05/22/16 0600  BP: (!) 84/60 (!) 79/62 (!) 83/65 (!) 77/57  Pulse: 90     Resp: 13 18 (!) 24 13  Temp: 97.8 F (36.6 C)     TempSrc: Axillary     SpO2: 95%     Weight:   172 lb 2.9 oz (78.1 kg)     Intake/Output from previous day:  Intake/Output Summary (Last 24 hours) at 05/22/16 0800 Last data filed at 05/22/16 0600  Gross per 24 hour  Intake            571.8 ml  Output             2000 ml  Net          -1428.2 ml    I/O since admission: -161  Wt Readings from Last 3 Encounters:  05/22/16 172 lb 2.9 oz (78.1 kg)  05/15/16 176 lb 2.4 oz (79.9 kg)  10/31/15 152 lb (68.9 kg)    Medications: . aspirin  81 mg Oral Daily  . atorvastatin  40 mg Oral q1800  . calcitRIOL  1.5 mcg Oral Q M,W,F-HD  . cinacalcet  60 mg Oral BID WC  . digoxin  0.0625 mg Oral QODAY  . hydrocerin   Topical BID  . insulin aspart  0-5 Units Subcutaneous QHS  . insulin aspart  0-9 Units Subcutaneous TID WC  . methocarbamol  500 mg Oral BID  . metoCLOPramide  5 mg Oral TID AC  . multivitamin  1 tablet Oral QHS  . sevelamer carbonate  2,400 mg Oral TID WC  . sodium chloride  250 mL Intravenous Once  . sodium chloride  250 mL Intravenous Once  . sodium chloride flush  10-40 mL Intracatheter Q12H  . Warfarin - Pharmacist Dosing Inpatient   Does not apply q1800    . DOBUTamine 5 mcg/kg/min (05/22/16 0600)  . DOPamine 12 mcg/kg/min (05/22/16 0745)  . heparin 900 Units/hr (05/21/16 1839)    Physical Exam:   General appearance: alert, cooperative and no distress Neck: no adenopathy, supple, symmetrical, trachea midline and thyroid not enlarged, symmetric, no tenderness/mass/nodules Lungs: slightly decreased BS without rales Heart: RRR 1/6 sem; no rub Abdomen: soft, non-tender; bowel sounds  normal; no masses,  no organomegaly Extremities: bilateral AKA Neurologic: Grossly normal   Rate: 99  Rhythm: normal sinus rhythm  ECG (independently read by me): NSR at 74  Lab Results:   Recent Labs  05/20/16 0202 05/21/16 0930  NA 131* 134*  K 3.8 4.1  CL 90* 96*  CO2 22 21*  GLUCOSE 115* 164*  BUN 26* 34*  CREATININE 6.02* 7.18*  CALCIUM 8.2* 7.1*  PHOS 6.4* 6.9*    Hepatic Function Latest Ref Rng & Units 05/21/2016 05/20/2016 05/18/2016  Total Protein 6.5 - 8.1 g/dL - - 7.3  Albumin 3.5 - 5.0 g/dL 3.0(L) 3.3(L) 3.4(L)  AST 15 - 41 U/L - - 46(H)  ALT 14 - 54 U/L - - 29  Alk Phosphatase 38 - 126 U/L - - 189(H)  Total Bilirubin 0.3 - 1.2 mg/dL - - 1.4(H)  Bilirubin, Direct 0.0 - 0.3 mg/dL - - -     Recent Labs  05/20/16 0855 05/21/16 0500 05/22/16 0500  WBC 8.0 14.3* 10.6*  HGB 11.6* 12.5 10.8*  HCT  34.5* 36.9 32.5*  MCV 85.8 85.0 84.9  PLT 251 195 160     Recent Labs  05/21/16 1455 05/21/16 2115 05/22/16 0215  TROPONINI 2.65* 2.12* 1.69*    Lab Results  Component Value Date   TSH 1.214 05/21/2016   No results for input(s): HGBA1C in the last 72 hours.   Recent Labs  05/20/16 0202 05/21/16 0930  ALBUMIN 3.3* 3.0*    Recent Labs  05/22/16 0500  INR 1.80   BNP (last 3 results) No results for input(s): BNP in the last 8760 hours.  ProBNP (last 3 results) No results for input(s): PROBNP in the last 8760 hours.   Lipid Panel     Component Value Date/Time   CHOL 121 (L) 09/10/2015 0902   TRIG 118 09/10/2015 0902   HDL 34 (L) 09/10/2015 0902   CHOLHDL 3.6 09/10/2015 0902   VLDL 24 09/10/2015 0902   LDLCALC 63 09/10/2015 0902   Heparin level .62   Co-ox:  39.4 ->42.2 ->56.6 -> 64.7 Dobutamine started yesterday and f/u co-ox improved to 64.7  Imaging:  Dg Chest Port 1 View  Result Date: 05/20/2016 CLINICAL DATA:  Central line placement EXAM: PORTABLE CHEST 1 VIEW COMPARISON:  05/17/2016 FINDINGS: Left internal jugular  central line has its tip in the SVC at the azygos level. No pneumothorax. The heart is enlarged. There is pulmonary venous hypertension. There is patchy density at the lung bases left worse than right that could be atelectasis or basilar pneumonia. No effusions. IMPRESSION: Central line tip in the SVC at the azygos level.  No pneumothorax. Probable fluid overload/interstitial edema. Patchy density at the lung bases left more than right could be atelectasis or pneumonia. Electronically Signed   By: Nelson Chimes M.D.   On: 05/20/2016 13:00   ------------------------------------------------------------------- Study Conclusions  - Left ventricle: The cavity size was normal. Wall thickness was   normal. Systolic function was moderately to severely reduced. The   estimated ejection fraction was in the range of 15% to 20%.   Akinesis of the mid-apicalanteroseptal and apical myocardium. - Mitral valve: Moderately calcified annulus. Mildly thickened   leaflets . There was mild regurgitation. - Right ventricle: The cavity size was mildly dilated. Systolic   function was moderately reduced. - Pulmonary arteries: Systolic pressure was moderately increased.   PA peak pressure: 42 mm Hg (S).   Definity contrast revealed severe LV dysfunction. There is an apical thrombus    Assessment/Plan:   Principal Problem:   NSTEMI manifesting as nausea/fatigue Active Problems:   ESRD on dialysis   Insulin dependent diabetes mellitus with complications ([place])   Anemia   CAD S/P CFX DES 02/01/15   Essential hypertension   Hypotension, unspecified   Cardiogenic shock (HCC)   LV (left ventricular) mural thrombus ([place])   Encounter for anticoagulation discussion and counseling  1. Cardiomyopathy;  EF in 2016 was nl; EF 15% yesterday with apical thrombus. 2. Cardiogenic shock; BP improved today to 95 - 100/76 on dopamine at 6.972 ug/k/min. Will try to wean as BP allows. Troponins with flat profile c/w demand  ischemia. Co-ox nadir 39.4  Increased to 64.7; BP remains low on Dopamine at 12 and dobutamine at 5; BP remains low in upper 70's to 80; will try to increase dobutamine to 7.5 mg (was transiently on during the night). Very low dose dig at 0.0625 qod was started. With hypotension, not a candidate for nitrate.hydralazine initiation.  3. CAD ; s/p PCI to Lcx 6/16 4. ESRD:  Feels better after dialysis yesterday with 2 liters removed. 5. PVD; s/p B AKA 6. DM:  7. Anticoagulation: warfarin started yesterday with apical thrombus.  Will continue ASA 81 and ticagrelor dc'd.. Continue heparin until therapeutic. INR went up quickly to 1.8 after only 1 dose; will decrease to 2.5 mg today. Will check LFT's with high liklihood for hepatic congestion. 8. Hyperlipidemia: on atorvastatin 40 mg  Unable to perform cath yesterday. Due to access issues.  Options for patient are very limited. Will continue dobutamine/dopamine presently; if ultimately able to be weaned  Then consider very low dose enapril or nitrates/hydralazine. May need palliative care discussion.   Time spent: Troy Sine, MD, [facility] 05/22/2016, 8:00 AM

## 2016-05-22 NOTE — Progress Notes (Signed)
[facility] Progress Note   Subjective: went to cath lab, unable to do cath due to limitation in arterial access. On dopamine gtt, more alert today, feeling better.  BP 100 / 70.  No sob or cough or CP.   Vitals:   05/22/16 0400 05/22/16 0500 05/22/16 0600 05/22/16 0800  BP: (!) 79/62 (!) 83/65 (!) 77/57 (!) 78/55  Pulse:    99  Resp: 18 (!) 24 13 18   Temp:    97.3 F (36.3 C)  TempSrc:    Axillary  SpO2:    93%  Weight:  78.1 kg (172 lb 2.9 oz)      Inpatient medications: . aspirin  81 mg Oral Daily  . atorvastatin  40 mg Oral q1800  . calcitRIOL  1.5 mcg Oral Q M,W,F-HD  . cinacalcet  60 mg Oral BID WC  . digoxin  0.0625 mg Oral QODAY  . hydrocerin   Topical BID  . insulin aspart  0-5 Units Subcutaneous QHS  . insulin aspart  0-9 Units Subcutaneous TID WC  . methocarbamol  500 mg Oral BID  . metoCLOPramide  5 mg Oral TID AC  . multivitamin  1 tablet Oral QHS  . sevelamer carbonate  2,400 mg Oral TID WC  . sodium chloride  250 mL Intravenous Once  . sodium chloride  250 mL Intravenous Once  . sodium chloride flush  10-40 mL Intracatheter Q12H  . Warfarin - Pharmacist Dosing Inpatient   Does not apply q1800   . DOBUTamine    . DOPamine 12 mcg/kg/min (05/22/16 0745)  . heparin 900 Units/hr (05/21/16 1839)   sodium chloride, sodium chloride, alteplase, diphenhydrAMINE, heparin, heparin, HYDROcodone-acetaminophen, lidocaine (PF), lidocaine-prilocaine, nitroGLYCERIN, ondansetron (ZOFRAN) IV, pentafluoroprop-tetrafluoroeth, sodium chloride flush, zolpidem  Exam: General: sleepy but arouses easily, Ox 3 Head: NCAT sclera not icteric MMM Neck: Supple.  Lungs: faint rales R base, L clear Heart: RRR with S1 S2.  Abdomen: soft NT + BS Lower extremities: bilateral AKAs; tr-1+ edema; right thigh no sig edeam Neuro: A & O  X 3. Moves all extremities spontaneously. Psych:  Responds to questions appropriately with a normal affect. Dialysis Access:left thigh AVGG    Dialysis Orders: MWF [facility]  4h  75kg   2/2 bath  P4   Hep 6000   L thigh AVG Mircera 50 q 2 - last 9/13 no Fe calcitriol 1.5  Recent labs:  hgb 11.7 9/13 19% sat was 21- didn't go up much after a full course of Fe- iPTH 2498 Setp, 859 Aug and 2400 in July  ++Last intervention on thigh graft was 8/10.  AF chronically low Renvela 3 ac and sensipar 30 bid - per pt - this is different from outpt med list  Assessment/Plan: 1. Hypotension - cardiogenic shock, EF yest 15%, no cath d/t arterial disease, on DA and dobutamine gtt now.   2. NSTEMI - hx PTCA and stent 01/2015, old inf MI by echo last yr 3. ESRD -  MWF HD. Plan for HD.  4. Volume - +3kg, asymptomatic 5. Anemia  - hgb 11.6 - last mircera 50 on 9/13 - no ESA needed for now;  6. Sec HPTH- cont renvela/ rocaltrol.  Ca dropped as inpt, prob from sensipar , will reduce 60 bid to 30 qd for now.  7. Nutrition - renal carb mod/vit when off NPO status. 8. DM - per primary  Plan - HD tomorrow in ICU , off sched, then resume MWF.   Vinson Moselleob Arnetha Silverthorne MD  [facility] pager [phone]    cell [phone] 05/22/2016, 9:38 AM    Recent Labs Lab 05/18/16 0719 05/20/16 0202 05/21/16 0930  NA 134* 131* 134*  K 5.0 3.8 4.1  CL 95* 90* 96*  CO2 20* 22 21*  GLUCOSE 91 115* 164*  BUN 41* 26* 34*  CREATININE 8.08* 6.02* 7.18*  CALCIUM 7.5* 8.2* 7.1*  PHOS  --  6.4* 6.9*    Recent Labs Lab 05/18/16 0022 05/20/16 0202 05/21/16 0930  AST 46*  --   --   ALT 29  --   --   ALKPHOS 189*  --   --   BILITOT 1.4*  --   --   PROT 7.3  --   --   ALBUMIN 3.4* 3.3* 3.0*    Recent Labs Lab 05/18/16 0022  05/20/16 0855 05/21/16 0500 05/22/16 0500  WBC 10.6*  < > 8.0 14.3* 10.6*  NEUTROABS 6.6  --   --   --   --   HGB 11.6*  < > 11.6* 12.5 10.8*  HCT 34.2*  < > 34.5* 36.9 32.5*  MCV 84.9  < > 85.8 85.0 84.9  PLT 224  < > 251 195 160  < > = values in this interval not displayed. Iron/TIBC/Ferritin/ %Sat    Component Value  Date/Time   IRON 48 06/17/2012 1356   TIBC 218 (L) 06/17/2012 1356   FERRITIN 1,262 (H) 06/17/2012 1356   IRONPCTSAT 22 06/17/2012 1356

## 2016-05-23 LAB — CBC
HCT: 28.6 % — ABNORMAL LOW (ref 36.0–46.0)
Hemoglobin: 9.9 g/dL — ABNORMAL LOW (ref 12.0–15.0)
MCH: 28.9 pg (ref 26.0–34.0)
MCHC: 34.6 g/dL (ref 30.0–36.0)
MCV: 83.4 fL (ref 78.0–100.0)
PLATELETS: 175 10*3/uL (ref 150–400)
RBC: 3.43 MIL/uL — ABNORMAL LOW (ref 3.87–5.11)
RDW: 17.9 % — AB (ref 11.5–15.5)
WBC: 11.5 10*3/uL — AB (ref 4.0–10.5)

## 2016-05-23 LAB — PROTIME-INR
INR: 2.88
Prothrombin Time: 30.7 seconds — ABNORMAL HIGH (ref 11.4–15.2)

## 2016-05-23 LAB — GLUCOSE, CAPILLARY
GLUCOSE-CAPILLARY: 204 mg/dL — AB (ref 65–99)
GLUCOSE-CAPILLARY: 242 mg/dL — AB (ref 65–99)
GLUCOSE-CAPILLARY: 262 mg/dL — AB (ref 65–99)
Glucose-Capillary: 235 mg/dL — ABNORMAL HIGH (ref 65–99)
Glucose-Capillary: 139 mg/dL — ABNORMAL HIGH (ref 65–99)

## 2016-05-23 LAB — HEPARIN LEVEL (UNFRACTIONATED): HEPARIN UNFRACTIONATED: 0.67 [IU]/mL (ref 0.30–0.70)

## 2016-05-23 LAB — RENAL FUNCTION PANEL
ALBUMIN: 2.8 g/dL — AB (ref 3.5–5.0)
Anion gap: 18 — ABNORMAL HIGH (ref 5–15)
BUN: 42 mg/dL — AB (ref 6–20)
CALCIUM: 7.3 mg/dL — AB (ref 8.9–10.3)
CO2: 17 mmol/L — ABNORMAL LOW (ref 22–32)
Chloride: 91 mmol/L — ABNORMAL LOW (ref 101–111)
Creatinine, Ser: 7.98 mg/dL — ABNORMAL HIGH (ref 0.44–1.00)
GFR calc Af Amer: 6 mL/min — ABNORMAL LOW (ref >60)
GFR, EST NON AFRICAN AMERICAN: 5 mL/min — AB (ref >60)
GLUCOSE: 248 mg/dL — AB (ref 65–99)
PHOSPHORUS: 4.6 mg/dL (ref 2.5–4.6)
POTASSIUM: 3.7 mmol/L (ref 3.5–5.1)
SODIUM: 126 mmol/L — AB (ref 135–145)

## 2016-05-23 LAB — HEMOGLOBIN AND HEMATOCRIT, BLOOD
HCT: 25.1 % — ABNORMAL LOW (ref 36.0–46.0)
Hemoglobin: 8.7 g/dL — ABNORMAL LOW (ref 12.0–15.0)

## 2016-05-23 LAB — TROPONIN I: TROPONIN I: 1.41 ng/mL — AB (ref <0.03)

## 2016-05-23 MED ORDER — LOPERAMIDE HCL 2 MG PO CAPS
2.0000 mg | ORAL_CAPSULE | ORAL | Status: DC | PRN
  Administered 2016-05-23: 2 mg via ORAL
  Filled 2016-05-23: qty 1

## 2016-05-23 MED ORDER — PENTAFLUOROPROP-TETRAFLUOROETH EX AERO: 1.0000 "application " | INHALATION_SPRAY | CUTANEOUS | Status: DC | PRN

## 2016-05-23 MED ORDER — MIDODRINE HCL 5 MG PO TABS
10.0000 mg | ORAL_TABLET | Freq: Three times a day (TID) | ORAL | Status: DC
  Administered 2016-05-23 – 2016-05-25 (×7): 10 mg via ORAL
  Filled 2016-05-23 (×7): qty 2

## 2016-05-23 MED ORDER — LIDOCAINE HCL (PF) 1 % IJ SOLN: 5.0000 mL | INTRAMUSCULAR | Status: DC | PRN

## 2016-05-23 MED ORDER — ACETAMINOPHEN 325 MG PO TABS
650.0000 mg | ORAL_TABLET | Freq: Four times a day (QID) | ORAL | Status: DC | PRN
Start: 2016-05-23 — End: 2016-05-25
  Administered 2016-05-23: 650 mg via ORAL
  Filled 2016-05-23: qty 2

## 2016-05-23 MED ORDER — LIDOCAINE-PRILOCAINE 2.5-2.5 % EX CREA
1.0000 "application " | TOPICAL_CREAM | CUTANEOUS | Status: DC | PRN
  Filled 2016-05-23: qty 5

## 2016-05-23 MED ORDER — HEPARIN SODIUM (PORCINE) 1000 UNIT/ML DIALYSIS: 1000.0000 [IU] | INTRAMUSCULAR | Status: DC | PRN

## 2016-05-23 MED ORDER — WARFARIN VIDEO
Freq: Once | Status: AC
  Administered 2016-05-23: 15:00:00

## 2016-05-23 MED ORDER — SODIUM CHLORIDE 0.9 % IV SOLN: 100.0000 mL | INTRAVENOUS | Status: DC | PRN

## 2016-05-23 MED ORDER — COUMADIN BOOK
Freq: Once | Status: AC
  Administered 2016-05-23: 15:00:00
  Filled 2016-05-23: qty 1

## 2016-05-23 MED ORDER — ALTEPLASE 2 MG IJ SOLR: 2.0000 mg | Freq: Once | INTRAMUSCULAR | Status: DC | PRN

## 2016-05-23 MED ORDER — PANTOPRAZOLE SODIUM 40 MG IV SOLR
40.0000 mg | Freq: Two times a day (BID) | INTRAVENOUS | Status: DC
  Administered 2016-05-23 – 2016-05-25 (×4): 40 mg via INTRAVENOUS
  Filled 2016-05-23 (×4): qty 40

## 2016-05-23 MED ORDER — METOCLOPRAMIDE HCL 5 MG/ML IJ SOLN
5.0000 mg | Freq: Four times a day (QID) | INTRAMUSCULAR | Status: DC | PRN
  Administered 2016-05-24: 5 mg via INTRAVENOUS
  Filled 2016-05-23: qty 2

## 2016-05-23 NOTE — Progress Notes (Signed)
Dr Sandy Salaamorotto notified of patient's ongoing nausea. Patient had one episode of emesis that was brownish black with coffee ground appearance noted. Patient was also noted having black stools last night as well. Heparin stopped per order.

## 2016-05-23 NOTE — Plan of Care (Signed)
Called by bedside nurse to notify me that early this afternoon the patient had dark stools and now is experiences emesis with some coffee-ground material.  I asked for her systemic anticoagulation to be held, and will start pantoprazole 40mg  IV BID with first dose now.  We will f/u a H/H now and then with scheduled am lab.  Mary CoursePaul S. Corotto MD

## 2016-05-23 NOTE — Progress Notes (Addendum)
[facility] Progress Note   Subjective: nauseous, no appetite, no SOB, on levo/ dobutamine gtts now  Vitals:   05/23/16 0200 05/23/16 0433 05/23/16 0500 05/23/16 0600  BP: (!) 80/59 (!) 80/67 (!) 78/64 (!) 89/62  Pulse: 99 94 97 91  Resp: 16 (!) 24 19 12   Temp:  97.5 F (36.4 C)    TempSrc:  Oral    SpO2: 98% 93% 98% 97%  Weight:  79.6 kg (175 lb 8 oz)      Inpatient medications: . aspirin  81 mg Oral Daily  . atorvastatin  40 mg Oral q1800  . calcitRIOL  1.5 mcg Oral Q M,W,F-HD  . cinacalcet  30 mg Oral Q breakfast  . digoxin  0.0625 mg Oral QODAY  . hydrocerin   Topical BID  . insulin aspart  0-5 Units Subcutaneous QHS  . insulin aspart  0-9 Units Subcutaneous TID WC  . methocarbamol  500 mg Oral BID  . metoCLOPramide  5 mg Oral TID AC  . midodrine  10 mg Oral TID WC  . multivitamin  1 tablet Oral QHS  . sevelamer carbonate  2,400 mg Oral TID WC  . sodium chloride  250 mL Intravenous Once  . sodium chloride  250 mL Intravenous Once  . sodium chloride flush  10-40 mL Intracatheter Q12H  . Warfarin - Pharmacist Dosing Inpatient   Does not apply q1800   . DOBUTamine 7.5 mcg/kg/min (05/22/16 2033)  . heparin 900 Units/hr (05/22/16 2033)  . norepinephrine (LEVOPHED) Adult infusion 12 mcg/min (05/22/16 2200)   sodium chloride, sodium chloride, diphenhydrAMINE, HYDROcodone-acetaminophen, hydrOXYzine, nitroGLYCERIN, ondansetron (ZOFRAN) IV, sodium chloride flush, zolpidem  Exam: General: sleepy but arouses easily, Ox 3 Head: NCAT sclera not icteric MMM Neck: Supple.  Lungs: faint rales R base, L clear Heart: RRR with S1 S2.  Abdomen: soft NT + BS Lower extremities: bilateral AKAs; tr-1+ edema; right thigh no sig edeam Neuro: A & O  X 3. Moves all extremities spontaneously. Psych:  Responds to questions appropriately with a normal affect. Dialysis Access:left thigh AVGG   Dialysis Orders: MWF [facility]  4h  75kg   2/2 bath  P4   Hep 6000   L thigh  AVG Mircera 50 q 2 - last 9/13 no Fe calcitriol 1.5  Recent labs:  hgb 11.7 9/13 19% sat was 21- didn't go up much after a full course of Fe- iPTH 2498 Setp, 859 Aug and 2400 in July  ++Last intervention on thigh graft was 8/10.  AF chronically low Renvela 3 ac and sensipar 30 bid - per pt - this is different from outpt med list  Assessment/Plan: 1. Hypotension - cardiogenic shock, EF yest 15%, no cath d/t arterial disease, on levo/ dobutamine drips. BP's 70's - 80's.  Adding back midodrine and getting po digoxin. Will consult palliative care for GOC. 2. NSTEMI - hx PTCA and stent 01/2015, old inf MI by echo last yr 3. ESRD -  MWF HD. Plan for HD.  4. Volume - +4kg, asymptomatic 5. Anemia  - hgb 11.6 - last mircera 50 on 9/13 - no ESA needed for now;  6. Sec HPTH- cont renvela/ rocaltrol.  Ca dropped as inpt, prob from sensipar , will reduce 60 bid to 30 qd for now.  7. Nutrition - renal carb mod/vit when off NPO status. 8. DM - per primary  Plan - HD today in ICU then Monday, UF 2kg , pall care consult, midodrine/ dig po.  Vinson Moselle MD [facility] pager [phone]    cell [phone] 05/23/2016, 8:56 AM    Recent Labs Lab 05/20/16 0202 05/21/16 0930 05/22/16 0907  NA 131* 134* 132*  K 3.8 4.1 4.0  CL 90* 96* 91*  CO2 22 21* 11*  GLUCOSE 115* 164* 302*  BUN 26* 34* 32*  CREATININE 6.02* 7.18* 7.42*  CALCIUM 8.2* 7.1* 6.9*  PHOS 6.4* 6.9*  --     Recent Labs Lab 05/18/16 0022 05/20/16 0202 05/21/16 0930 05/22/16 0907  AST 46*  --   --  61*  ALT 29  --   --  33  ALKPHOS 189*  --   --  169*  BILITOT 1.4*  --   --  2.7*  PROT 7.3  --   --  7.0  ALBUMIN 3.4* 3.3* 3.0* 3.0*    Recent Labs Lab 05/18/16 0022  05/21/16 0500 05/22/16 0500 05/23/16 0509  WBC 10.6*  < > 14.3* 10.6* 11.5*  NEUTROABS 6.6  --   --   --   --   HGB 11.6*  < > 12.5 10.8* 9.9*  HCT 34.2*  < > 36.9 32.5* 28.6*  MCV 84.9  < > 85.0 84.9 83.4  PLT 224  < > 195 160 175  < >  = values in this interval not displayed. Iron/TIBC/Ferritin/ %Sat    Component Value Date/Time   IRON 48 06/17/2012 1356   TIBC 218 (L) 06/17/2012 1356   FERRITIN 1,262 (H) 06/17/2012 1356   IRONPCTSAT 22 06/17/2012 1356

## 2016-05-23 NOTE — Progress Notes (Signed)
ADVANCED HF ROUNDING NOTE Subjective:   Remains on dobutamine 7.5. Started on norepi yesterday for SBP in 70s. Now on norepi 3. SBP 90s. Feels ok. About to restart CVVHD.   Dr. Jonnie Finner has placed [facility] consult.   Objective:   Vital Signs : Vitals:   05/23/16 0900 05/23/16 0945 05/23/16 1015 05/23/16 1045  BP: (!) 82/59 (!) 77/60 (!) 79/65 93/64  Pulse: 94 (!) 118    Resp: 19 15 (!) 28 (!) 21  Temp:      TempSrc:      SpO2: 95% (!) 76%    Weight:        Intake/Output from previous day:  Intake/Output Summary (Last 24 hours) at 05/23/16 1254 Last data filed at 05/23/16 0600  Gross per 24 hour  Intake           913.87 ml  Output                0 ml  Net           913.87 ml    I/O since admission: -777  Wt Readings from Last 3 Encounters:  05/23/16 79.6 kg (175 lb 8 oz)  05/15/16 79.9 kg (176 lb 2.4 oz)  10/31/15 68.9 kg (152 lb)    Medications: . aspirin  81 mg Oral Daily  . atorvastatin  40 mg Oral q1800  . calcitRIOL  1.5 mcg Oral Q M,W,F-HD  . cinacalcet  30 mg Oral Q breakfast  . digoxin  0.0625 mg Oral QODAY  . hydrocerin   Topical BID  . insulin aspart  0-5 Units Subcutaneous QHS  . insulin aspart  0-9 Units Subcutaneous TID WC  . methocarbamol  500 mg Oral BID  . metoCLOPramide  5 mg Oral TID AC  . midodrine  10 mg Oral TID WC  . multivitamin  1 tablet Oral QHS  . sevelamer carbonate  2,400 mg Oral TID WC  . sodium chloride  250 mL Intravenous Once  . sodium chloride  250 mL Intravenous Once  . sodium chloride flush  10-40 mL Intracatheter Q12H  . Warfarin - Pharmacist Dosing Inpatient   Does not apply q1800    . DOBUTamine 7.5 mcg/kg/min (05/22/16 2033)  . heparin 900 Units/hr (05/22/16 2033)  . norepinephrine (LEVOPHED) Adult infusion 3 mcg/min (05/23/16 0900)    Physical Exam:   General appearance: alert, cooperative and no distress Neck: no adenopathy, supple, symmetrical, trachea midline and thyroid not enlarged, symmetric, no  tenderness/mass/nodules Lungs: slightly decreased BS without rales Heart: RRR 1/6 sem; no rub Abdomen: soft, non-tender; bowel sounds normal; no masses,  no organomegaly Extremities: bilateral AKA Neurologic: Grossly normal   Rate: 90s  Rhythm: normal sinus rhythm  Lab Results:   Recent Labs  05/21/16 0930 05/22/16 0907  NA 134* 132*  K 4.1 4.0  CL 96* 91*  CO2 21* 11*  GLUCOSE 164* 302*  BUN 34* 32*  CREATININE 7.18* 7.42*  CALCIUM 7.1* 6.9*  PHOS 6.9*  --     Hepatic Function Latest Ref Rng & Units 05/22/2016 05/21/2016 05/20/2016  Total Protein 6.5 - 8.1 g/dL 7.0 - -  Albumin 3.5 - 5.0 g/dL 3.0(L) 3.0(L) 3.3(L)  AST 15 - 41 U/L 61(H) - -  ALT 14 - 54 U/L 33 - -  Alk Phosphatase 38 - 126 U/L 169(H) - -  Total Bilirubin 0.3 - 1.2 mg/dL 2.7(H) - -  Bilirubin, Direct 0.0 - 0.3 mg/dL - - -  Recent Labs  05/21/16 0500 05/22/16 0500 05/23/16 0509  WBC 14.3* 10.6* 11.5*  HGB 12.5 10.8* 9.9*  HCT 36.9 32.5* 28.6*  MCV 85.0 84.9 83.4  PLT 195 160 175     Recent Labs  05/22/16 0855 05/22/16 1455 05/23/16 0510  TROPONINI 1.45* 1.26* 1.41*    Lab Results  Component Value Date   TSH 1.214 05/21/2016   No results for input(s): HGBA1C in the last 72 hours.   Recent Labs  05/21/16 0930 05/22/16 0907  PROT  --  7.0  ALBUMIN 3.0* 3.0*  AST  --  61*  ALT  --  33  ALKPHOS  --  169*  BILITOT  --  2.7*    Recent Labs  05/23/16 0509  INR 2.88   BNP (last 3 results) No results for input(s): BNP in the last 8760 hours.  ProBNP (last 3 results) No results for input(s): PROBNP in the last 8760 hours.   Lipid Panel     Component Value Date/Time   CHOL 121 (L) 09/10/2015 0902   TRIG 118 09/10/2015 0902   HDL 34 (L) 09/10/2015 0902   CHOLHDL 3.6 09/10/2015 0902   VLDL 24 09/10/2015 0902   LDLCALC 63 09/10/2015 0902   Heparin level .69     Imaging:  No results  found. ------------------------------------------------------------------- Study Conclusions  - Left ventricle: The cavity size was normal. Wall thickness was   normal. Systolic function was moderately to severely reduced. The   estimated ejection fraction was in the range of 15% to 20%.   Akinesis of the mid-apicalanteroseptal and apical myocardium. - Mitral valve: Moderately calcified annulus. Mildly thickened   leaflets . There was mild regurgitation. - Right ventricle: The cavity size was mildly dilated. Systolic   function was moderately reduced. - Pulmonary arteries: Systolic pressure was moderately increased.   PA peak pressure: 42 mm Hg (S).   Definity contrast revealed severe LV dysfunction. There is an apical thrombus    Assessment/Plan:   Principal Problem:   NSTEMI manifesting as nausea/fatigue Active Problems:   ESRD on dialysis   Insulin dependent diabetes mellitus with complications ([place])   Anemia   CAD S/P CFX DES 02/01/15   Essential hypertension   Hypotension, unspecified   Cardiogenic shock (HCC)   LV (left ventricular) mural thrombus ([place])   Encounter for anticoagulation discussion and counseling  1. Cardiomyopathy;  EF in 2016 was nl; EF 15% yesterday with apical thrombus. 2. Cardiogenic shock;  --on dobutamine 7.5/ norepi started yesterday for SBP 70 now in 90s on norepi 3 --EF 15%  --Co-ox:  39.4 ->42.2 ->56.6 -> 64.7-> 71%-> pending 3. CAD/NSTEMI ; s/p PCI to Lcx 6/16 --unable to perform cardiac cath this admission as no access site 4. ESRD:   5. PVD; s/p B AKA 6. DM:  7. Anticoagulation:  --on warfarin with apical thrombus.  INR 2.8. INR rose quickly in setting of shock and poor hepatic function. Will continue ASA 81 with NSTEMI  8. Hyperlipidemia: on atorvastatin 40 mg   She unfortunately appears to be nearing the end of her life. She is now dependent on dual pressors to keep SBP above 70s. She will not be able to tolerate outpatient HD and  thus we have no other options. I discussed this with her and her family in a very direct manner. They are willing to talk to [facility] team but I fear they do not fully comprehend how severe the situation is.   After HD today  will begin to wean norepi and will not restart. Will then need to wean dobutamine. Suspect she may not make it out of the hospital.   The patient is critically ill with multiple organ systems failure and requires high complexity decision making for assessment and support, frequent evaluation and titration of therapies, application of advanced monitoring technologies and extensive interpretation of multiple databases.   Critical Care Time devoted to patient care services described in this note is 35 Minutes.    Bensimhon, Daniel,MD 1:00 PM

## 2016-05-23 NOTE — Progress Notes (Signed)
ANTICOAGULATION CONSULT NOTE - Follow Up Consult  Pharmacy Consult for Heparin and Coumadin Indication: Apical Thrombus  Allergies  Allergen Reactions  . Percocet [Oxycodone-Acetaminophen] Other (See Comments)    GI UPSET  . Flagyl [Metronidazole] Other (See Comments)  . Soap Itching    IVORY SOAP  . Tramadol Itching    Patient Measurements: Weight: 175 lb 8 oz (79.6 kg) Heparin Dosing Weight: 78.1  Vital Signs: Temp: 97.5 F (36.4 C) (09/23 0433) Temp Source: Oral (09/23 0433) BP: 89/62 (09/23 0600) Pulse Rate: 91 (09/23 0600)  Labs:  Recent Labs  05/21/16 0500 05/21/16 0930  05/22/16 0500 05/22/16 0855 05/22/16 0907 05/22/16 1455 05/23/16 0509 05/23/16 0510  HGB 12.5  --   --  10.8*  --   --   --  9.9*  --   HCT 36.9  --   --  32.5*  --   --   --  28.6*  --   PLT 195  --   --  160  --   --   --  175  --   LABPROT  --   --   --  21.1*  --   --   --  30.7*  --   INR  --   --   --  1.80  --   --   --  2.88  --   HEPARINUNFRC 0.70  --   --  0.62  --   --   --  0.67  --   CREATININE  --  7.18*  --   --   --  7.42*  --   --   --   TROPONINI  --  2.61*  < >  --  1.45*  --  1.26*  --  1.41*  < > = values in this interval not displayed.  Estimated Creatinine Clearance: 9.5 mL/min (by C-G formula based on SCr of 7.42 mg/dL (H)).   Medications:  . DOBUTamine 7.5 mcg/kg/min (05/22/16 2033)  . heparin 900 Units/hr (05/22/16 2033)  . norepinephrine (LEVOPHED) Adult infusion 12 mcg/min (05/22/16 2200)    Assessment: 65 yoF started on heparin for r/o ACS. Cath on 9/20 showed new apical thrombus so Coumadin initiated.  INR now therapeutic with significant jump to 2.88 from 1.8 possibly 2/2 hepatic congestion (AST/alk phos/tbili elevated). HL remains therapeutic at 0.67. Hgb continues to trend down to 9.9 today, plt wnl and stable. No reported S/Sx of bleeding but will need to monitor closely.   Goal of Therapy:  INR goal 2-3 HL 0.3-0.7   Plan:  - Hold Coumadin  tonight with significant trend up in INR - Continue Heparin 900 Units/hr until INR therapeutic x 24 hrs - Monitor INR and heparin level daily - Monitor CBC, S/Sx bleeding closely   Doroteo Bradford K. Velva Harman, PharmD, BCPS, CPP Clinical Pharmacist Pager: [phone] Phone: [phone] 05/23/2016 9:01 AM

## 2016-05-24 DIAGNOSIS — Z515 Encounter for palliative care: Secondary | ICD-10-CM

## 2016-05-24 LAB — GLUCOSE, CAPILLARY
GLUCOSE-CAPILLARY: 238 mg/dL — AB (ref 65–99)
GLUCOSE-CAPILLARY: 158 mg/dL — AB (ref 65–99)
GLUCOSE-CAPILLARY: 93 mg/dL (ref 65–99)
Glucose-Capillary: 173 mg/dL — ABNORMAL HIGH (ref 65–99)

## 2016-05-24 LAB — CBC
HCT: 25.9 % — ABNORMAL LOW (ref 36.0–46.0)
HEMOGLOBIN: 8.7 g/dL — AB (ref 12.0–15.0)
MCH: 28.2 pg (ref 26.0–34.0)
MCHC: 33.6 g/dL (ref 30.0–36.0)
MCV: 83.8 fL (ref 78.0–100.0)
Platelets: 158 10*3/uL (ref 150–400)
RBC: 3.09 MIL/uL — AB (ref 3.87–5.11)
RDW: 18.5 % — ABNORMAL HIGH (ref 11.5–15.5)
WBC: 11.5 10*3/uL — ABNORMAL HIGH (ref 4.0–10.5)

## 2016-05-24 LAB — PROTIME-INR
INR: 3.34
PROTHROMBIN TIME: 34.6 s — AB (ref 11.4–15.2)

## 2016-05-24 MED ORDER — DOBUTAMINE IN D5W 4-5 MG/ML-% IV SOLN: 5.0000 ug/kg/min | INTRAVENOUS | Status: DC

## 2016-05-24 NOTE — Progress Notes (Signed)
ADVANCED HF ROUNDING NOTE Subjective:   Remains on dobutamine 7.5 and norepi 2. Had CVVHD yesterday. Had CG emesis last night and maroon stools.   She discussed situation with Dr. Jonnie Finner today. Now DNR. Wants to go home. Palliative Care meeting today.     Objective:   Vital Signs : Vitals:   05/24/16 0700 05/24/16 0751 05/24/16 0800 05/24/16 0900  BP: 90/68  (!) 86/60 (!) 92/58  Pulse:   (!) 107   Resp: _0 Temp:  97.8 F (36.6 C)    TempSrc:  Axillary    SpO2:   99%   Weight:        Intake/Output from previous day:  Intake/Output Summary (Last 24 hours) at 05/24/16 1019 Last data filed at 05/24/16 0800  Gross per 24 hour  Intake           602.85 ml  Output             1300 ml  Net          -697.15 ml    I/O since admission: -517  Wt Readings from Last 3 Encounters:  05/24/16 77.9 kg (171 lb 11.8 oz)  05/15/16 79.9 kg (176 lb 2.4 oz)  10/31/15 68.9 kg (152 lb)    Medications: . aspirin  81 mg Oral Daily  . atorvastatin  40 mg Oral q1800  . calcitRIOL  1.5 mcg Oral Q M,W,F-HD  . cinacalcet  30 mg Oral Q breakfast  . digoxin  0.0625 mg Oral QODAY  . hydrocerin   Topical BID  . insulin aspart  0-5 Units Subcutaneous QHS  . insulin aspart  0-9 Units Subcutaneous TID WC  . methocarbamol  500 mg Oral BID  . metoCLOPramide  5 mg Oral TID AC  . midodrine  10 mg Oral TID WC  . multivitamin  1 tablet Oral QHS  . pantoprazole (PROTONIX) IV  40 mg Intravenous Q12H  . sevelamer carbonate  2,400 mg Oral TID WC  . sodium chloride  250 mL Intravenous Once  . sodium chloride  250 mL Intravenous Once  . sodium chloride flush  10-40 mL Intracatheter Q12H  . Warfarin - Pharmacist Dosing Inpatient   Does not apply q1800    . DOBUTamine 7.5 mcg/kg/min (05/23/16 2216)  . norepinephrine (LEVOPHED) Adult infusion Stopped (05/24/16 1012)    Physical Exam:   General appearance: sittingin bed alert, cooperative and no distress Neck: no adenopathy, supple,  symmetrical, trachea midline and thyroid not enlarged, symmetric, no tenderness/mass/nodules Lungs: clear anteriorly  Heart: RRR 1/6 sem; no rub Abdomen: obese soft, non-tender; bowel sounds normal; no masses,  no organomegaly Extremities: bilateral AKA Neurologic: Grossly normal   Rate: 90s  Rhythm: normal sinus rhythm  Lab Results:   Recent Labs  05/22/16 0907 05/23/16 1359  NA 132* 126*  K 4.0 3.7  CL 91* 91*  CO2 11* 17*  GLUCOSE 302* 248*  BUN 32* 42*  CREATININE 7.42* 7.98*  CALCIUM 6.9* 7.3*  PHOS  --  4.6    Hepatic Function Latest Ref Rng & Units 05/23/2016 05/22/2016 05/21/2016  Total Protein 6.5 - 8.1 g/dL - 7.0 -  Albumin 3.5 - 5.0 g/dL 2.8(L) 3.0(L) 3.0(L)  AST 15 - 41 U/L - 61(H) -  ALT 14 - 54 U/L - 33 -  Alk Phosphatase 38 - 126 U/L - 169(H) -  Total Bilirubin 0.3 - 1.2 mg/dL - 2.7(H) -  Bilirubin, Direct 0.0 - 0.3 mg/dL - - -  Recent Labs  05/22/16 0500 05/23/16 0509 05/23/16 2325 05/24/16 0500  WBC 10.6* 11.5*  --  11.5*  HGB 10.8* 9.9* 8.7* 8.7*  HCT 32.5* 28.6* 25.1* 25.9*  MCV 84.9 83.4  --  83.8  PLT 160 175  --  158     Recent Labs  05/22/16 0855 05/22/16 1455 05/23/16 0510  TROPONINI 1.45* 1.26* 1.41*    Lab Results  Component Value Date   TSH 1.214 05/21/2016   No results for input(s): HGBA1C in the last 72 hours.   Recent Labs  05/22/16 0907 05/23/16 1359  PROT 7.0  --   ALBUMIN 3.0* 2.8*  AST 61*  --   ALT 33  --   ALKPHOS 169*  --   BILITOT 2.7*  --     Recent Labs  05/24/16 0500  INR 3.34   BNP (last 3 results) No results for input(s): BNP in the last 8760 hours.  ProBNP (last 3 results) No results for input(s): PROBNP in the last 8760 hours.   Lipid Panel     Component Value Date/Time   CHOL 121 (L) 09/10/2015 0902   TRIG 118 09/10/2015 0902   HDL 34 (L) 09/10/2015 0902   CHOLHDL 3.6 09/10/2015 0902   VLDL 24 09/10/2015 0902   LDLCALC 63 09/10/2015 0902   Heparin level .72      Imaging:  No results found. ------------------------------------------------------------------- Study Conclusions  - Left ventricle: The cavity size was normal. Wall thickness was   normal. Systolic function was moderately to severely reduced. The   estimated ejection fraction was in the range of 15% to 20%.   Akinesis of the mid-apicalanteroseptal and apical myocardium. - Mitral valve: Moderately calcified annulus. Mildly thickened   leaflets . There was mild regurgitation. - Right ventricle: The cavity size was mildly dilated. Systolic   function was moderately reduced. - Pulmonary arteries: Systolic pressure was moderately increased.   PA peak pressure: 42 mm Hg (S).   Definity contrast revealed severe LV dysfunction. There is an apical thrombus    Assessment/Plan:   Principal Problem:   NSTEMI manifesting as nausea/fatigue Active Problems:   ESRD on dialysis   Insulin dependent diabetes mellitus with complications ([place])   Anemia   CAD S/P CFX DES 02/01/15   Essential hypertension   Hypotension, unspecified   Cardiogenic shock (HCC)   LV (left ventricular) mural thrombus ([place])   Encounter for anticoagulation discussion and counseling   GI bleed  1. Cardiomyopathy;  EF in 2016 was nl; EF 15% yesterday with apical thrombus. 2. Cardiogenic shock;  --on dobutamine 7.5/ norepi started yesterday for SBP 70 now in 90s on norepi 3 --EF 15%  --Co-ox:  39.4 ->42.2 ->56.6 -> 64.7-> 71%-> pending 3. CAD/NSTEMI ; s/p PCI to Lcx 6/16 --unable to perform cardiac cath this admission as no access site 4. ESRD:   5. PVD; s/p B AKA 6. DM:  7. Anticoagulation:  --on warfarin with apical thrombus.  INR 2.8. INR rose quickly in setting of shock and poor hepatic function. Will continue ASA 81 with NSTEMI  8. Hyperlipidemia: on atorvastatin 40 mg   She unfortunately appears to be nearing the end of her life. She is now dependent on dual pressors to keep SBP above 70s. She will  not be able to tolerate outpatient HD and thus we have no other options. I discussed this with her and her family in a very direct manner yesterday. She now is DNR and wants to go home. "  just turn everything off." Palliative Care meeting today   Will turn off norepi and wean dobutamine to 5. Await Palliative care discussion. Prognosis likely days without inotropes and CVVHD.   The patient is critically ill with multiple organ systems failure and requires high complexity decision making for assessment and support, frequent evaluation and titration of therapies, application of advanced monitoring technologies and extensive interpretation of multiple databases.   Critical Care Time devoted to patient care services described in this note is 35 Minutes.    Renley Gutman,MD 10:19 AM

## 2016-05-24 NOTE — Progress Notes (Addendum)
[facility] Progress Note   Subjective: maroon stool and CG emesis last night, IV hep turned off.  This am no c/o, SOB when lying flat.  Had HD yest with 1.3 kg off.   Vitals:   05/24/16 0600 05/24/16 0700 05/24/16 0751 05/24/16 0800  BP: 96/63 90/68  (!) 86/60  Pulse: (!) 104   (!) 107  Resp: 15 16  16   Temp:   97.8 F (36.6 C)   TempSrc:   Axillary   SpO2: 100%   99%  Weight:        Inpatient medications: . aspirin  81 mg Oral Daily  . atorvastatin  40 mg Oral q1800  . calcitRIOL  1.5 mcg Oral Q M,W,F-HD  . cinacalcet  30 mg Oral Q breakfast  . digoxin  0.0625 mg Oral QODAY  . hydrocerin   Topical BID  . insulin aspart  0-5 Units Subcutaneous QHS  . insulin aspart  0-9 Units Subcutaneous TID WC  . methocarbamol  500 mg Oral BID  . metoCLOPramide  5 mg Oral TID AC  . midodrine  10 mg Oral TID WC  . multivitamin  1 tablet Oral QHS  . pantoprazole (PROTONIX) IV  40 mg Intravenous Q12H  . sevelamer carbonate  2,400 mg Oral TID WC  . sodium chloride  250 mL Intravenous Once  . sodium chloride  250 mL Intravenous Once  . sodium chloride flush  10-40 mL Intracatheter Q12H  . Warfarin - Pharmacist Dosing Inpatient   Does not apply q1800   . DOBUTamine 7.5 mcg/kg/min (05/23/16 2216)  . norepinephrine (LEVOPHED) Adult infusion 6 mcg/min (05/23/16 1200)   sodium chloride, sodium chloride, sodium chloride, sodium chloride, acetaminophen, alteplase, diphenhydrAMINE, heparin, HYDROcodone-acetaminophen, hydrOXYzine, lidocaine (PF), lidocaine-prilocaine, loperamide, metoCLOPramide (REGLAN) injection, nitroGLYCERIN, ondansetron (ZOFRAN) IV, pentafluoroprop-tetrafluoroeth, sodium chloride flush, zolpidem  Exam: General: sleepy but arouses easily, Ox 3 Head: NCAT sclera not icteric MMM Neck: Supple.  Lungs: faint rales R base, L clear Heart: RRR with S1 S2.  Abdomen: soft NT + BS Lower extremities: bilateral AKAs; tr-1+ edema; right thigh no sig edeam Neuro: A & O  X  3. Moves all extremities spontaneously. Psych:  Responds to questions appropriately with a normal affect. Dialysis Access:left thigh AVGG   Dialysis Orders: MWF [facility]  4h  75kg   2/2 bath  P4   Hep 6000   L thigh AVG Mircera 50 q 2 - last 9/13 no Fe calcitriol 1.5  Recent labs:  hgb 11.7 9/13 19% sat was 21- didn't go up much after a full course of Fe- iPTH 2498 Setp, 859 Aug and 2400 in July  ++Last intervention on thigh graft was 8/10.  AF chronically low Renvela 3 ac and sensipar 30 bid - per pt - this is different from outpt med list  Assessment: 1. Hypotension/ cardiogenic shock - EF 15%, on dobutamine/ levo gtt. Dig/ midodrine po.  Poor prog per cardiology. Not CRRT candidate.  Will need to dc drips sometime and pt is anxious to move ahead with this. Have d/w patient EOL issues this am, she doesn't want any heroic measures, "just let me go" in case of cardiac/ resp arrest.  Have d/w her sister also on the phone.  Have written for DNR. Palliative care is meeting w her today at noon. 2. NSTEMI - hx PTCA and stent 01/2015, old inf MI by echo last yr; unable to cath d/t severe arterial disease 3. ESRD -  MWF HD. Plan for HD.  4. Volume - +3kg, +orthopnea, no distress 5. Anemia  - hgb 11.6 - last mircera 50 on 9/13 - no ESA needed for now;  6. Nutrition - renal carb mod/vit when off NPO status. 7. DM - per primary  Plan - as above   Vinson Moselleob Asa Fath MD [facility] pager [phone]    cell [phone] 05/24/2016, 8:41 AM    Recent Labs Lab 05/20/16 0202 05/21/16 0930 05/22/16 0907 05/23/16 1359  NA 131* 134* 132* 126*  K 3.8 4.1 4.0 3.7  CL 90* 96* 91* 91*  CO2 22 21* 11* 17*  GLUCOSE 115* 164* 302* 248*  BUN 26* 34* 32* 42*  CREATININE 6.02* 7.18* 7.42* 7.98*  CALCIUM 8.2* 7.1* 6.9* 7.3*  PHOS 6.4* 6.9*  --  4.6    Recent Labs Lab 05/18/16 0022  05/21/16 0930 05/22/16 0907 05/23/16 1359  AST 46*  --   --  61*  --   ALT 29  --   --  33  --   ALKPHOS 189*   --   --  169*  --   BILITOT 1.4*  --   --  2.7*  --   PROT 7.3  --   --  7.0  --   ALBUMIN 3.4*  < > 3.0* 3.0* 2.8*  < > = values in this interval not displayed.  Recent Labs Lab 05/18/16 0022  05/22/16 0500 05/23/16 0509 05/23/16 2325 05/24/16 0500  WBC 10.6*  < > 10.6* 11.5*  --  11.5*  NEUTROABS 6.6  --   --   --   --   --   HGB 11.6*  < > 10.8* 9.9* 8.7* 8.7*  HCT 34.2*  < > 32.5* 28.6* 25.1* 25.9*  MCV 84.9  < > 84.9 83.4  --  83.8  PLT 224  < > 160 175  --  158  < > = values in this interval not displayed. Iron/TIBC/Ferritin/ %Sat    Component Value Date/Time   IRON 48 06/17/2012 1356   TIBC 218 (L) 06/17/2012 1356   FERRITIN 1,262 (H) 06/17/2012 1356   IRONPCTSAT 22 06/17/2012 1356

## 2016-05-24 NOTE — Progress Notes (Addendum)
ANTICOAGULATION CONSULT NOTE - Follow Up Consult  Pharmacy Consult for Coumadin Indication: Apical Thrombus  Patient Measurements: Weight: 171 lb 11.8 oz (77.9 kg) Heparin Dosing Weight: 78.1  Vital Signs: Temp: 97.8 F (36.6 C) (09/24 0751) Temp Source: Axillary (09/24 0751) BP: 90/68 (09/24 0700) Pulse Rate: 104 (09/24 0600)  Labs:  Recent Labs  05/21/16 0930  05/22/16 0500 05/22/16 0855 05/22/16 0907 05/22/16 1455 05/23/16 0509 05/23/16 0510 05/23/16 1359 05/23/16 2325 05/24/16 0500  HGB  --   < > 10.8*  --   --   --  9.9*  --   --  8.7* 8.7*  HCT  --   < > 32.5*  --   --   --  28.6*  --   --  25.1* 25.9*  PLT  --   --  160  --   --   --  175  --   --   --  158  LABPROT  --   --  21.1*  --   --   --  30.7*  --   --   --  34.6*  INR  --   --  1.80  --   --   --  2.88  --   --   --  3.34  HEPARINUNFRC  --   --  0.62  --   --   --  0.67  --   --   --   --   CREATININE 7.18*  --   --   --  7.42*  --   --   --  7.98*  --   --   TROPONINI 2.61*  < >  --  1.45*  --  1.26*  --  1.41*  --   --   --   < > = values in this interval not displayed.  Estimated Creatinine Clearance: 8.7 mL/min (by C-G formula based on SCr of 7.98 mg/dL (H)).  Assessment: 45 yoF started on heparin for r/o ACS. Cath on 9/20 showed new apical thrombus so Coumadin initiated. INR with significant trend up 1.27 > 1.8 > 2.88 and warfarin was held. Today INR still trending up and now is SUPRAtherapeutic at 3.34 (likely from 7.5mg  dose on 9/21). Heparin is off.  Patient had emesis that was brownish black with coffee ground appearance as well as black stools last night. Hgb continues to trend down to 8.7 today, plt wnl but also with trend down.  Goal of Therapy:  INR goal 2-3 HL 0.3-0.7   Plan:  - Hold Coumadin tonight - Daily INR - Monitor CBC, S/Sx bleeding closely - F/u on resolution of bleed and [facility]   Sherron MondayAubrey N. Seara Hinesley, PharmD Clinical Pharmacy Resident Pager: [phone] 05/24/16  8:12 AM

## 2016-05-24 NOTE — Consult Note (Signed)
Consultation Note Date: 05/24/2016   Patient Name: [name]  DOB: 03/17/1971  MRN: [id number]  Age / Sex: 45 y.o., female  PCP: Billee Cashing, MD Referring Physician: Lennette Bihari, MD  Reason for Consultation: Establishing goals of care, Hospice Evaluation, Non pain symptom management, Pain control, Psychosocial/spiritual support and Withdrawal of life-sustaining treatment  HPI/Patient Profile: 45 y.o. female  with past medical history of Peripheral vascular disease, bilateral above the knee amputee, end-stage renal disease on hemodialysis, coronary artery disease, essential hypertension, insulin-dependent diabetes with complications, left ventricular thrombus, cardiomyopathy non-STEMI, hyperlipidemia admitted on 05/17/2016 with nausea and fatigue. Per H&P patient had been in her usual state of health up until 5 days prior to admission when she had onset of nausea and vomiting, sweats, malaise and fatigue. She was evaluated in the emergency department and noted to be hypotensive and was admitted for observation for presumed dehydration. She was given gentle IV fluids, underwent dialysis while in the hospital and her antihypertensives were held. She had persistent lactic acidosis. Her nausea and vomiting had resolved and patient was clinically stable after 2 days without evidence of infection and was discharged home. She was readmitted 05/17/2016 after being home and continuing to feel fatigued and lethargic.Marland KitchenHer creatinine was 7.7, and troponin was 4.3. Patient has a history of non-STEMI in June 2016. She is now being treated for cardiogenic shock and has been on dobutamine as well as norepinephrine. Despite this her blood pressures have remained systolic only in the 70s and 90s. Her ejection fraction with repeat echo is now only 15%. At this point due to hypotension she is unable to continue with  dialysis  Clinical Assessment and Goals of Care: Patient is alert and oriented 4. She shows good understanding of her grim prognosis and clinical condition. Family meeting was held with patient's daughter, sister, her niece as well as her to pastor's. She does have a son who is incarcerated at [facility] facility in [place]. She is able to tell all who are present that she is ready to die, stop hemodialysis. Updated family as to medical conditions that are also impeding her ability to continue with dialysis such as hypertension worsening heart failure and now with an ejection fraction of only 15%  Patient at this point is able to participate in discussion and share with her family her values and her wishes. She is unmarried. She has no designated healthcare power of attorney; her sister [name] has been a chief source of support. Patient does have 2 children one daughter, [name] as well as a son, [name] who is currently incarcerated. All family has been in contact with each other and making joint decisions.    SUMMARY OF RECOMMENDATIONS   DO NOT RESUSCITATE DO NOT INTUBATE Patient wishes to continue pressors overnight. Her goal is hopefully for her son, [name], to get released from prison to come see her and tell her before she dies. Palliative medicine team as well as family working on conversations with  the [facility] correction[facility] facility to facilitate this. There was no one available on 05/24/2016 that could authorize an emergency visit of this type. If her son is unable to come it is her goal to stop pressors and hopefully be stable enough to transfer to [facility] No further hemodialysis  Code Status/Advance Care Planning:  DNR    Symptom Management:   Pain: Patient is verbalizing intermittent chronic back pain. Continue aspirin for critical care medicine at this point. With end-stage renal disease it is recommended that patient look at  either fentanyl oxycodone or Dilaudid  Dyspnea: Continue O2 via nasal cannula, nebulizer treatments; opioids for severe dyspnea  Palliative Prophylaxis:   Aspiration, Bowel Regimen, Frequent Pain Assessment, Oral Care and Turn Reposition  Additional Recommendations (Limitations, Scope, Preferences):  Avoid Hospitalization, Minimize Medications, No Artificial Feeding, No Blood Transfusions, No Chemotherapy, No Diagnostics, No Hemodialysis, No Radiation and No Surgical Procedures  Psycho-social/Spiritual:   Desire for further Chaplaincy support:no  Additional Recommendations: Grief/Bereavement Support  Prognosis:   < 2 weeks in the setting of end-stage renal disease no longer to able to continue dialysis, worsening heart failure with an ejection fraction now of 15%, dyspnea ,cardiogenic shock. Plan is to DC pressors once we can ascertain if and when her son can come see her  Discharge Planning: Anticipate hospital death versus inpatient hospice      Primary Diagnoses: Present on Admission: . NSTEMI manifesting as nausea/fatigue . Essential hypertension . Anemia . Hypotension, unspecified   I have reviewed the medical record, interviewed the patient and family, and examined the patient. The following aspects are pertinent.  Past Medical History:  Diagnosis Date  . Coronary artery disease 01/2015   single vessel disease CFX, stented 02/01/2015  . Diabetes mellitus    IDDM  . ESRF (end stage renal failure) (HCC)    dialysis M,W, F; left thigh AV graft  . GERD (gastroesophageal reflux disease)   . High cholesterol   . History of gangrene    left foot  . Hx MRSA infection   . Hypertension    states has been on med. x "a long time"  . Impaired mobility    bilateral AKA, is in a wheelchair  . 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 Myocardial infarction (HCC)    " slight heart attack on 01/31/15  . 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 S/P bilateral  above knee amputation [facility])    Social History   Social History  . Marital status: Single    Spouse name: N/A  . Number of children: N/A  . Years of education: N/A   Social History Main Topics  . Smoking status: Never Smoker  . Smokeless tobacco: Never Used  . Alcohol use No  . Drug use: No  . Sexual activity: Not Asked   Other Topics Concern  . None   Social History Narrative  . None   Family History  Problem Relation Age of Onset  . Diabetes Mother   . Heart disease Mother   . Hypertension Mother   . Heart attack Mother 12  . Diabetes Sister   . Hypertension Brother   . Heart attack Brother    Scheduled Meds: . aspirin  81 mg Oral Daily  . atorvastatin  40 mg Oral q1800  . calcitRIOL  1.5 mcg Oral Q M,W,F-HD  . cinacalcet  30 mg Oral Q breakfast  . digoxin  0.0625 mg Oral  QODAY  . hydrocerin   Topical BID  . insulin aspart  0-5 Units Subcutaneous QHS  . insulin aspart  0-9 Units Subcutaneous TID WC  . methocarbamol  500 mg Oral BID  . midodrine  10 mg Oral TID WC  . multivitamin  1 tablet Oral QHS  . pantoprazole (PROTONIX) IV  40 mg Intravenous Q12H  . sevelamer carbonate  2,400 mg Oral TID WC  . sodium chloride  250 mL Intravenous Once  . sodium chloride  250 mL Intravenous Once  . sodium chloride flush  10-40 mL Intracatheter Q12H  . Warfarin - Pharmacist Dosing Inpatient   Does not apply q1800   Continuous Infusions: . DOBUTamine 5 mcg/kg/min (05/24/16 1030)   PRN Meds:.sodium chloride, sodium chloride, sodium chloride, sodium chloride, acetaminophen, alteplase, diphenhydrAMINE, heparin, HYDROcodone-acetaminophen, hydrOXYzine, lidocaine (PF), lidocaine-prilocaine, loperamide, metoCLOPramide (REGLAN) injection, nitroGLYCERIN, ondansetron (ZOFRAN) IV, pentafluoroprop-tetrafluoroeth, sodium chloride flush, zolpidem Medications Prior to Admission:  Prior to Admission medications   Medication Sig Start Date End Date Taking? Authorizing Provider  aspirin 81  MG chewable tablet Chew 81 mg by mouth daily.   Yes Historical Provider, MD  atorvastatin (LIPITOR) 40 MG tablet Take 1 tablet (40 mg total) by mouth daily at 6 PM. 02/02/15  Yes Calvert CantorSaima Rizwan, MD  cetirizine (ZYRTEC) 10 MG tablet Take 10 mg by mouth daily as needed for allergies.  01/13/16  Yes Historical Provider, MD  cinacalcet (SENSIPAR) 30 MG tablet Take 60 mg by mouth daily.   Yes Historical Provider, MD  HYDROcodone-acetaminophen (NORCO/VICODIN) 5-325 MG tablet Take 2 tablets by mouth every 4 (four) hours as needed. Patient taking differently: Take 2 tablets by mouth every 4 (four) hours as needed for moderate pain.  05/12/16  Yes Jerelyn ScottMartha Linker, MD  insulin aspart protamine-insulin aspart (NOVOLOG 70/30) (70-30) 100 UNIT/ML injection Inject 6-10 Units into the skin 2 (two) times daily with a meal. Take 10 units in the morning and 6 units in the evening   Yes Historical Provider, MD  methocarbamol (ROBAXIN) 500 MG tablet Take 1 tablet (500 mg total) by mouth 2 (two) times daily. 05/12/16  Yes Jerelyn ScottMartha Linker, MD  metoCLOPramide (REGLAN) 5 MG tablet Take 5 mg by mouth 3 (three) times daily before meals.   Yes Historical Provider, MD  metoprolol (LOPRESSOR) 50 MG tablet Take 50 mg by mouth daily.    Yes Historical Provider, MD  midodrine (PROAMATINE) 10 MG tablet Take 10 mg by mouth 3 (three) times daily.   Yes Historical Provider, MD  NOVOLIN 70/30 (70-30) 100 UNIT/ML injection Inject 10-12 Units into the skin 2 (two) times daily. 04/07/16  Yes Historical Provider, MD  sevelamer (RENVELA) 800 MG tablet Take 2,400 mg by mouth 3 (three) times daily with meals.    Yes Historical Provider, MD  ticagrelor (BRILINTA) 90 MG TABS tablet Take 1 tablet (90 mg total) by mouth 2 (two) times daily. 02/02/15  Yes Calvert CantorSaima Rizwan, MD  amLODipine (NORVASC) 10 MG tablet Take 10 mg by mouth daily. 05/15/16   Historical Provider, MD   Allergies  Allergen Reactions  . Percocet [Oxycodone-Acetaminophen] Other (See Comments)     GI UPSET  . Flagyl [Metronidazole] Other (See Comments)  . Soap Itching    IVORY SOAP  . Tramadol Itching   Review of Systems  Unable to perform ROS: Other    Physical Exam  Constitutional: She is oriented to person, place, and time. She appears well-developed and well-nourished.  HENT:  Head: Normocephalic and atraumatic.  Neck: Normal  range of motion.  Cardiovascular:  BP soft  Pulmonary/Chest:  Increased work of breathing; audible wheezes  Abdominal:  Bloody loose stool  Musculoskeletal: Normal range of motion.  bilat AKA  Neurological: She is alert and oriented to person, place, and time.  Skin: Skin is warm and dry.  Psychiatric: Her behavior is normal. Judgment and thought content normal.  Affect constricted  Nursing note and vitals reviewed.   Vital Signs: BP (!) 72/57   Pulse 83   Temp 98 F (36.7 C) (Axillary)   Resp (!) 32   Wt 77.9 kg (171 lb 11.8 oz)   LMP 05/14/2005 Comment: no period since dialysis in 2006  SpO2 96%   BMI 30.42 kg/m  Pain Assessment: No/denies pain POSS *See Group Information*: 1-Acceptable,Awake and alert Pain Score: 0-No pain   SpO2: SpO2: 96 % O2 Device:SpO2: 96 % O2 Flow Rate: .O2 Flow Rate (L/min): 2 L/min  IO: Intake/output summary:  Intake/Output Summary (Last 24 hours) at 05/24/16 1728 Last data filed at 05/24/16 1200  Gross per 24 hour  Intake           379.83 ml  Output                0 ml  Net           379.83 ml    LBM: Last BM Date: 05/23/16 Baseline Weight: Weight: 76.2 kg (167 lb 15.9 oz) Most recent weight: Weight: 77.9 kg (171 lb 11.8 oz)     Palliative Assessment/Data:   Flowsheet Rows   Flowsheet Row Most Recent Value  Intake Tab  Referral Department  Nephrology  Unit at Time of Referral  ICU  Palliative Care Primary Diagnosis  Nephrology  Date Notified  05/23/16  Palliative Care Type  New Palliative care  Reason for referral  Clarify Goals of Care, Counsel Regarding Hospice  Date of Admission   05/17/16  Date first seen by Palliative Care  05/24/16  # of days Palliative referral response time  1 Day(s)  # of days IP prior to Palliative referral  6  Clinical Assessment  Palliative Performance Scale Score  40%  Pain Max last 24 hours  6  Pain Min Last 24 hours  0  Dyspnea Max Last 24 Hours  6  Dyspnea Min Last 24 hours  3  Nausea Max Last 24 Hours  Not able to report  Nausea Min Last 24 Hours  Not able to report  Anxiety Max Last 24 Hours  Not able to report  Anxiety Min Last 24 Hours  Not able to report  Other Max Last 24 Hours  Not able to report  Psychosocial & Spiritual Assessment  Palliative Care Outcomes  Patient/Family meeting held?  Yes  Who was at the meeting?  pt, sister, niece, daughter 2 pastors and 1 pastor's wife  Palliative Care Outcomes  Clarified goals of care, Provided psychosocial or spiritual support, Counseled regarding hospice  Patient/Family wishes: Interventions discontinued/not started   Mechanical Ventilation, BiPAP, Hemodialysis, Transfusion, Trach, NIPPV, Tube feedings/TPN, PEG  Palliative Care follow-up planned  Yes, Facility      Time In: 1200 Time Out: 1330 Time Total: 90 min Greater than 50%  of this time was spent counseling and coordinating care related to the above assessment and plan. Staffed with Dr. Teressa Lower   Signed by: Irean Hong, NP   Please contact Palliative Medicine Team phone at [phone] for questions and concerns.  For individual provider: See Loretha Stapler

## 2016-05-25 DIAGNOSIS — Z789 Other specified health status: Secondary | ICD-10-CM

## 2016-05-25 DIAGNOSIS — Z515 Encounter for palliative care: Secondary | ICD-10-CM

## 2016-05-25 LAB — COMPREHENSIVE METABOLIC PANEL
ALK PHOS: 144 U/L — AB (ref 38–126)
ALT: 32 U/L (ref 14–54)
ANION GAP: 15 (ref 5–15)
AST: 54 U/L — ABNORMAL HIGH (ref 15–41)
Albumin: 2.7 g/dL — ABNORMAL LOW (ref 3.5–5.0)
BILIRUBIN TOTAL: 1.2 mg/dL (ref 0.3–1.2)
BUN: 43 mg/dL — ABNORMAL HIGH (ref 6–20)
CALCIUM: 7.7 mg/dL — AB (ref 8.9–10.3)
CO2: 23 mmol/L (ref 22–32)
Chloride: 92 mmol/L — ABNORMAL LOW (ref 101–111)
Creatinine, Ser: 6.54 mg/dL — ABNORMAL HIGH (ref 0.44–1.00)
GFR, EST AFRICAN AMERICAN: 8 mL/min — AB (ref >60)
GFR, EST NON AFRICAN AMERICAN: 7 mL/min — AB (ref >60)
Glucose, Bld: 122 mg/dL — ABNORMAL HIGH (ref 65–99)
Potassium: 4.5 mmol/L (ref 3.5–5.1)
Sodium: 130 mmol/L — ABNORMAL LOW (ref 135–145)
TOTAL PROTEIN: 6.4 g/dL — AB (ref 6.5–8.1)

## 2016-05-25 LAB — CBC
HCT: 25.2 % — ABNORMAL LOW (ref 36.0–46.0)
Hemoglobin: 8.5 g/dL — ABNORMAL LOW (ref 12.0–15.0)
MCH: 28.3 pg (ref 26.0–34.0)
MCHC: 33.7 g/dL (ref 30.0–36.0)
MCV: 84 fL (ref 78.0–100.0)
PLATELETS: 147 10*3/uL — AB (ref 150–400)
RBC: 3 MIL/uL — ABNORMAL LOW (ref 3.87–5.11)
RDW: 18.5 % — AB (ref 11.5–15.5)
WBC: 11.2 10*3/uL — ABNORMAL HIGH (ref 4.0–10.5)

## 2016-05-25 LAB — GLUCOSE, CAPILLARY
GLUCOSE-CAPILLARY: 134 mg/dL — AB (ref 65–99)
Glucose-Capillary: 151 mg/dL — ABNORMAL HIGH (ref 65–99)

## 2016-05-25 LAB — PROTIME-INR
INR: 3.54
Prothrombin Time: 36.3 seconds — ABNORMAL HIGH (ref 11.4–15.2)

## 2016-05-25 MED ORDER — ACETAMINOPHEN 650 MG RE SUPP: 650.0000 mg | Freq: Four times a day (QID) | RECTAL | Status: DC | PRN

## 2016-05-25 MED ORDER — GLYCOPYRROLATE 0.2 MG/ML IJ SOLN
0.2000 mg | INTRAMUSCULAR | Status: DC | PRN
  Filled 2016-05-25: qty 1

## 2016-05-25 MED ORDER — ONDANSETRON HCL 4 MG/2ML IJ SOLN: 4.0000 mg | Freq: Four times a day (QID) | INTRAMUSCULAR | 0 refills | Status: AC | PRN

## 2016-05-25 MED ORDER — ONDANSETRON 4 MG PO TBDP: 4.0000 mg | ORAL_TABLET | Freq: Four times a day (QID) | ORAL | Status: DC | PRN

## 2016-05-25 MED ORDER — SODIUM CHLORIDE 0.9 % IV SOLN
INTRAVENOUS | Status: DC
  Administered 2016-05-25: 15:00:00 via INTRAVENOUS

## 2016-05-25 MED ORDER — DIPHENHYDRAMINE HCL 25 MG PO CAPS: 25.0000 mg | ORAL_CAPSULE | Freq: Three times a day (TID) | ORAL | 0 refills | Status: AC | PRN

## 2016-05-25 MED ORDER — SODIUM CHLORIDE 0.9 % IV SOLN: 10.0000 ug/h | INTRAVENOUS | Status: AC

## 2016-05-25 MED ORDER — HALOPERIDOL 0.5 MG PO TABS
0.5000 mg | ORAL_TABLET | ORAL | Status: DC | PRN
  Filled 2016-05-25: qty 1

## 2016-05-25 MED ORDER — POLYVINYL ALCOHOL 1.4 % OP SOLN
1.0000 [drp] | Freq: Four times a day (QID) | OPHTHALMIC | Status: DC | PRN
  Filled 2016-05-25: qty 15

## 2016-05-25 MED ORDER — ACETAMINOPHEN 325 MG PO TABS: 650.0000 mg | ORAL_TABLET | Freq: Four times a day (QID) | ORAL | Status: AC | PRN

## 2016-05-25 MED ORDER — HALOPERIDOL LACTATE 5 MG/ML IJ SOLN: 0.5000 mg | INTRAMUSCULAR | Status: DC | PRN

## 2016-05-25 MED ORDER — SODIUM CHLORIDE 0.9 % IV SOLN
10.0000 ug/h | INTRAVENOUS | Status: DC
  Administered 2016-05-25: 10 ug/h via INTRAVENOUS
  Filled 2016-05-25: qty 50

## 2016-05-25 MED ORDER — ORAL CARE MOUTH RINSE
15.0000 mL | Freq: Two times a day (BID) | OROMUCOSAL | Status: DC
  Administered 2016-05-25: 15 mL via OROMUCOSAL

## 2016-05-25 MED ORDER — GLYCOPYRROLATE 1 MG PO TABS
1.0000 mg | ORAL_TABLET | ORAL | Status: DC | PRN
  Filled 2016-05-25: qty 1

## 2016-05-25 MED ORDER — HYDROXYZINE HCL 25 MG PO TABS: 25.0000 mg | ORAL_TABLET | Freq: Three times a day (TID) | ORAL | 0 refills | Status: AC | PRN

## 2016-05-25 MED ORDER — HALOPERIDOL 0.5 MG PO TABS: 0.5000 mg | ORAL_TABLET | ORAL | Status: AC | PRN

## 2016-05-25 MED ORDER — ONDANSETRON HCL 4 MG/2ML IJ SOLN: 4.0000 mg | Freq: Four times a day (QID) | INTRAMUSCULAR | Status: DC | PRN

## 2016-05-25 MED ORDER — FENTANYL BOLUS VIA INFUSION
10.0000 ug | INTRAVENOUS | Status: DC | PRN
  Filled 2016-05-25: qty 10

## 2016-05-25 MED ORDER — HALOPERIDOL LACTATE 2 MG/ML PO CONC
0.5000 mg | ORAL | Status: DC | PRN
  Filled 2016-05-25: qty 0.3

## 2016-05-25 MED ORDER — BIOTENE DRY MOUTH MT LIQD: 15.0000 mL | OROMUCOSAL | Status: DC | PRN

## 2016-05-25 MED ORDER — GLYCOPYRROLATE 1 MG PO TABS: 1.0000 mg | ORAL_TABLET | ORAL | Status: AC | PRN

## 2016-05-25 MED ORDER — ACETAMINOPHEN 325 MG PO TABS: 650.0000 mg | ORAL_TABLET | Freq: Four times a day (QID) | ORAL | Status: DC | PRN

## 2016-05-25 NOTE — Progress Notes (Signed)
ANTICOAGULATION CONSULT NOTE - Follow Up Consult  Pharmacy Consult for Coumadin Indication: Apical Thrombus  Patient Measurements: Weight: 172 lb 9.9 oz (78.3 kg) Heparin Dosing Weight: 78.1  Vital Signs: Temp: 98.2 F (36.8 C) (09/25 0800) Temp Source: Axillary (09/25 0800) BP: 73/41 (09/25 1000) Pulse Rate: 98 (09/25 0900)  Labs:  Recent Labs  05/22/16 1455  05/23/16 0509 05/23/16 0510 05/23/16 1359 05/23/16 2325 05/24/16 0500 05/25/16 0335  HGB  --   < > 9.9*  --   --  8.7* 8.7* 8.5*  HCT  --   < > 28.6*  --   --  25.1* 25.9* 25.2*  PLT  --   --  175  --   --   --  158 147*  LABPROT  --   --  30.7*  --   --   --  34.6* 36.3*  INR  --   --  2.88  --   --   --  3.34 3.54  HEPARINUNFRC  --   --  0.67  --   --   --   --   --   CREATININE  --   --   --   --  7.98*  --   --  6.54*  TROPONINI 1.26*  --   --  1.41*  --   --   --   --   < > = values in this interval not displayed.  Estimated Creatinine Clearance: 10.7 mL/min (by C-G formula based on SCr of 6.54 mg/dL (H)).  Assessment: 45 yoF started on heparin for r/o ACS. Cath on 9/20 showed new apical thrombus so Coumadin initiated. INR with significant trend up and now supratherapeutic at 3.54 so warfarin has been held x2 and heparin drip is now off. Patient had emesis that was brownish black with coffee ground appearance as well as black stools over the weekend. Hgb continues to trend down to 8.5 today and plt down to 147. Of note, patient has been seen by palliative care with plans to turn off pressors and discontinue HD with DNR/DNI once her son is able to see her. Will continue to hold anticoagulation at this time pending any new palliative care recommendations.  Goal of Therapy:  INR goal 2-3 HL 0.3-0.7   Plan:  - Hold Coumadin  - Daily INR - Monitor CBC, S/Sx bleeding closely - F[facility] based on ongoing GOC discussions  Fredonia HighlandMichael Bitonti, PharmD PGY-1 Pharmacy Resident Pager:  [phone] 05/25/2016

## 2016-05-25 NOTE — Progress Notes (Addendum)
[facility]. Pertinent information given. Awaiting transportation. Update given to family. Pt is comfortable at this time.

## 2016-05-25 NOTE — Progress Notes (Signed)
CS[facility] has [facility] and [facility].  RN to call report.  Pollyann SavoyJody Niomie Englert, LCSW Evening/ED Coverage [phone]

## 2016-05-25 NOTE — Progress Notes (Signed)
Wasted 220cc of Fentanyl in the sink. Witnessed by [employer] RN.  [facility] Elink notified. Pt belonging given to daughter, Wynema BirchSharonda Luevano.

## 2016-05-25 NOTE — Progress Notes (Signed)
[place] - 2H-10   [facility] RN Note for BP   Received request from [place] for patient/family interest in [facility] with request to transfer today.  Chart reviewed. Patient is eligible and there is a bed available at [facility] for patient to transfer today.  Met with patient and family to confirm interest and explain services.   Family agreeable to transfer today.   CSW is aware.  Registration paperwork completed.  Dr. Orpah Melter to assume care per family request.  Please fax discharge summary to [phone].   RN please call report to [phone].    Please arrange transport for patient ASAP.    Thank you for this referral.    Mickie Kay, [facility] Liaison  [phone]

## 2016-05-25 NOTE — Progress Notes (Signed)
[facility] and made referral for [facility] hosp[facility]. CSW is awaiting to hear back [facility].  Per notes, [facility] - just trying to see if her son (in prison) can come see her. CSW continues to follow for discharge to hospice.         Lance MussAshley Gardner,MSW, LCSW [facility] ED/[facility] Clinical Social Worker [phone]

## 2016-05-25 NOTE — Progress Notes (Signed)
CSW received return phone call from [place] [facility], hospital liaison for hospice and palliative care [facility] [place], who reports that they have a bed available for patient. She reports that she will reach out to the family to discuss a meeting time to complete paperwork. CSW continues to follow for transport [facility].          Lance MussAshley Gardner,MSW, LCSW [facility] ED/54M Clinical Social Worker [phone]

## 2016-05-25 NOTE — Progress Notes (Signed)
[facility] Progress Note   Subjective: very sleepy this AM .    Vitals:   05/25/16 0400 05/25/16 0500 05/25/16 0600 05/25/16 0700  BP: (!) 73/56 (!) 70/41 (!) 73/45   Pulse:      Resp: (!) 23 14 17 15   Temp: 98 F (36.7 C)     TempSrc: Oral     SpO2: 96%     Weight:  78.3 kg (172 lb 9.9 oz)      Inpatient medications: . aspirin  81 mg Oral Daily  . atorvastatin  40 mg Oral q1800  . calcitRIOL  1.5 mcg Oral Q M,W,F-HD  . cinacalcet  30 mg Oral Q breakfast  . digoxin  0.0625 mg Oral QODAY  . hydrocerin   Topical BID  . insulin aspart  0-5 Units Subcutaneous QHS  . insulin aspart  0-9 Units Subcutaneous TID WC  . methocarbamol  500 mg Oral BID  . midodrine  10 mg Oral TID WC  . multivitamin  1 tablet Oral QHS  . pantoprazole (PROTONIX) IV  40 mg Intravenous Q12H  . sevelamer carbonate  2,400 mg Oral TID WC  . sodium chloride  250 mL Intravenous Once  . sodium chloride  250 mL Intravenous Once  . sodium chloride flush  10-40 mL Intracatheter Q12H  . Warfarin - Pharmacist Dosing Inpatient   Does not apply q1800   . DOBUTamine 5 mcg/kg/min (05/24/16 2000)   sodium chloride, sodium chloride, sodium chloride, sodium chloride, acetaminophen, alteplase, diphenhydrAMINE, heparin, HYDROcodone-acetaminophen, hydrOXYzine, lidocaine (PF), lidocaine-prilocaine, loperamide, metoCLOPramide (REGLAN) injection, nitroGLYCERIN, ondansetron (ZOFRAN) IV, pentafluoroprop-tetrafluoroeth, sodium chloride flush, zolpidem  Exam: General: sleepy, does not arouse easy Head: NCAT sclera not icteric MMM Neck: Supple.  Lungs: normal WOB Heart: RRR with S1 S2.  Abdomen: soft NT + BS Lower extremities: bilateral AKAs Neuro: A & O  X 3. Moves all extremities spontaneously. Psych:  Responds to questions appropriately with a normal affect. Dialysis Access:left thigh AVGG   Dialysis Orders: MWF [facility]  4h  75kg   2/2 bath  P4   Hep 6000   L thigh AVG Mircera 50 q 2 - last 9/13 no Fe  calcitriol 1.5  Recent labs:  hgb 11.7 9/13 19% sat was 21- didn't go up much after a full course of Fe- iPTH 2498 Setp, 859 Aug and 2400 in July  ++Last intervention on thigh graft was 8/10.  AF chronically low Renvela 3 ac and sensipar 30 bid - per pt - this is different from outpt med list  Assessment: 1. Hypotension/ cardiogenic shock - EF 15%, on dobutamine/ levo gtt. Dig/ midodrine po.  Poor prog per cardiology. Not CRRT candidate.  She is DNR/ DNI.  No further dialysis.  Confirmed this AM with pt.  Son trying to come from prison.   2. NSTEMI - hx PTCA and stent 01/2015, old inf MI by echo last yr; unable to cath d/t severe arterial disease 3. ESRD -  MWF HD. No further HD. 4. Volume - +3kg, +orthopnea, no distress 5. Anemia  - hgb 11.6 - last mircera 50 on 9/13 - no ESA needed for now;  6. Nutrition - renal carb mod/vit when off NPO status. 7. DM - per primary  Plan - we will sign off at this time.  Please don't hesitate to contact us with any questions or issues.    Bufford ButtnerElizabeth Kynzley Dowson MD [facility] pager [phone]   cell [phone] 05/25/2016, 8:21 AM    Recent  Labs Lab 05/20/16 0202 05/21/16 0930 05/22/16 0907 05/23/16 1359 05/25/16 0335  NA 131* 134* 132* 126* 130*  K 3.8 4.1 4.0 3.7 4.5  CL 90* 96* 91* 91* 92*  CO2 22 21* 11* 17* 23  GLUCOSE 115* 164* 302* 248* 122*  BUN 26* 34* 32* 42* 43*  CREATININE 6.02* 7.18* 7.42* 7.98* 6.54*  CALCIUM 8.2* 7.1* 6.9* 7.3* 7.7*  PHOS 6.4* 6.9*  --  4.6  --     Recent Labs Lab 05/22/16 0907 05/23/16 1359 05/25/16 0335  AST 61*  --  54*  ALT 33  --  32  ALKPHOS 169*  --  144*  BILITOT 2.7*  --  1.2  PROT 7.0  --  6.4*  ALBUMIN 3.0* 2.8* 2.7*    Recent Labs Lab 05/23/16 0509 05/23/16 2325 05/24/16 0500 05/25/16 0335  WBC 11.5*  --  11.5* 11.2*  HGB 9.9* 8.7* 8.7* 8.5*  HCT 28.6* 25.1* 25.9* 25.2*  MCV 83.4  --  83.8 84.0  PLT 175  --  158 147*   Iron/TIBC/Ferritin/ %Sat    Component Value  Date/Time   IRON 48 06/17/2012 1356   TIBC 218 (L) 06/17/2012 1356   FERRITIN 1,262 (H) 06/17/2012 1356   IRONPCTSAT 22 06/17/2012 1356

## 2016-05-25 NOTE — Progress Notes (Addendum)
Daily Progress Note   Patient Name: [name]       Date: 05/25/2016 DOB: Dec 24, 1970  Age: 45 y.o. MRN#: [id number] Attending Physician: Troy Sine, MD Primary Care Physician: Ricke Hey, MD Admit Date: 05/17/2016  Reason for Consultation/Follow-up: Non pain symptom management, Terminal Care and Withdrawal of life-sustaining treatment  Subjective: Met with patient, her 3 sisters, her daughter, and her pastor. Spoke with [facility] and they are not allowing patient's son to visit. Family and patient ready to proceed with comfort care. Patient only complaint is itching, otherwise she is comfortable. Denies pain or SOB.  Review of Systems  Constitutional: Positive for malaise/fatigue.  Cardiovascular: Negative.   Gastrointestinal: Negative.   Psychiatric/Behavioral: Negative.   All other systems reviewed and are negative.  Length of Stay: 7  Current Medications: Scheduled Meds:  . hydrocerin   Topical BID  . insulin aspart  0-5 Units Subcutaneous QHS  . insulin aspart  0-9 Units Subcutaneous TID WC  . pantoprazole (PROTONIX) IV  40 mg Intravenous Q12H    Continuous Infusions: . fentaNYL infusion INTRAVENOUS      PRN Meds: acetaminophen **OR** acetaminophen, antiseptic oral rinse, diphenhydrAMINE, fentaNYL, glycopyrrolate **OR** glycopyrrolate **OR** glycopyrrolate, haloperidol **OR** haloperidol **OR** haloperidol lactate, hydrOXYzine, loperamide, metoCLOPramide (REGLAN) injection, nitroGLYCERIN, ondansetron **OR** ondansetron (ZOFRAN) IV, polyvinyl alcohol, zolpidem  Physical Exam  Constitutional: She is oriented to person, place, and time. She appears well-developed and well-nourished.  HENT:  Head: Normocephalic and atraumatic.  Pulmonary/Chest:  Effort normal. She has rales.  Abdominal: Soft. Bowel sounds are normal.  Musculoskeletal:  Generalized weakness   Neurological: She is alert and oriented to person, place, and time.  Skin: Skin is warm and dry. Rash (on back) noted.  Itching   Psychiatric: She has a normal mood and affect. Her behavior is normal. Judgment and thought content normal.            Vital Signs: BP (!) 80/56   Pulse (!) 113   Temp 98.3 F (36.8 C) (Axillary)   Resp (!) 24   Wt 78.3 kg (172 lb 9.9 oz)   LMP 05/14/2005 Comment: no period since dialysis in 2006  SpO2 98%   BMI 30.58 kg/m  SpO2: SpO2: 98 % O2 Device: O2 Device: Nasal Cannula O2 Flow Rate: O2 Flow Rate (L/min):  2 L/min  Intake/output summary:  Intake/Output Summary (Last 24 hours) at 05/25/16 1336 Last data filed at 05/25/16 1300  Gross per 24 hour  Intake            137.5 ml  Output                0 ml  Net            137.5 ml   LBM: Last BM Date: 05/23/16 Baseline Weight: Weight: 76.2 kg (167 lb 15.9 oz) Most recent weight: Weight: 78.3 kg (172 lb 9.9 oz)       Palliative Assessment/Data: PPS: 30%    Flowsheet Rows   Flowsheet Row Most Recent Value  Intake Tab  Referral Department  Nephrology  Unit at Time of Referral  ICU  Palliative Care Primary Diagnosis  Nephrology  Date Notified  05/23/16  Palliative Care Type  New Palliative care  Reason for referral  Clarify Goals of Care, Counsel Regarding Hospice  Date of Admission  05/17/16  Date first seen by Palliative Care  05/24/16  # of days Palliative referral response time  1 Day(s)  # of days IP prior to Palliative referral  6  Clinical Assessment  Palliative Performance Scale Score  40%  Pain Max last 24 hours  6  Pain Min Last 24 hours  0  Dyspnea Max Last 24 Hours  6  Dyspnea Min Last 24 hours  3  Nausea Max Last 24 Hours  Not able to report  Nausea Min Last 24 Hours  Not able to report  Anxiety Max Last 24 Hours  Not able to report  Anxiety Min Last 24  Hours  Not able to report  Other Max Last 24 Hours  Not able to report  Psychosocial & Spiritual Assessment  Palliative Care Outcomes  Patient/Family meeting held?  Yes  Who was at the meeting?  pt, sister, niece, daughter 2 pastors and 1 pastor's wife  Palliative Care Outcomes  Clarified goals of care, Provided psychosocial or spiritual support, Counseled regarding hospice  Patient/Family wishes: Interventions discontinued/not started   Mechanical Ventilation, BiPAP, Hemodialysis, Transfusion, Trach, NIPPV, Tube feedings/TPN, PEG  Palliative Care follow-up planned  Yes, Facility      Patient Active Problem List   Diagnosis Date Noted  . Palliative care encounter   . LV (left ventricular) mural thrombus ([place])   . Encounter for anticoagulation discussion and counseling   . Cardiogenic shock ([place])   . Hypotension, unspecified 05/14/2016  . Renal cyst 09/11/2015  . Essential hypertension 03/06/2015  . CAD S/P CFX DES 02/01/15   . PVD (peripheral vascular disease) ([place])   . NSTEMI manifesting as nausea/fatigue   . 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 ESRD on dialysis 04/24/2012  . Insulin dependent diabetes mellitus with complications ([place]) 38/25/0539  . Anemia 04/24/2012    Palliative Care Assessment & Plan   Patient Profile: 45 y.o. female  with past medical history of Peripheral vascular disease, bilateral above the knee amputee, end-stage renal disease on hemodialysis, coronary artery disease, essential hypertension, insulin-dependent diabetes with complications, left ventricular thrombus, cardiomyopathy non-STEMI, hyperlipidemia admitted on 05/17/2016 with nausea and fatigue. Per H&P patient had been in her usual state of health up until 5 days prior to admission when she had onset of nausea and vomiting, sweats, malaise and fatigue. She was evaluated in the emergency department and noted to be hypotensive and was admitted for observation for presumed dehydration. She  was given  gentle IV fluids, underwent dialysis while in the hospital and her antihypertensives were held. She had persistent lactic acidosis. Her nausea and vomiting had resolved and patient was clinically stable after 2 days without evidence of infection and was discharged home. She was readmitted 05/17/2016 after being home and continuing to feel fatigued and lethargic.Marland KitchenHer creatinine was 7.7, and troponin was 4.3. Patient has a history of non-STEMI in June 2016. She is now being treated for cardiogenic shock and has been on dobutamine as well as norepinephrine. Despite this her blood pressures have remained systolic only in the 54G and 90s. Her ejection fraction with repeat echo is now only 15%. At this point due to hypotension she is unable to continue with dialysis  Assessment: - End stage renal failure- not able to continue on HD -Cardiogenic shock- hemostasis only being maintained with dobutamine at this pointm.  -Family and patient requesting comfort measures only  Recommendations/Plan:  -D/C dobutamine and all other non comfort medications  Start fentanyl drip at 26mg/hr with 10 mcg bolus PRN pain or SOB (fentanyl preferred d/t pt with renal failure)  Goals of Care and Additional Recommendations:  Limitations on Scope of Treatment: Full Comfort Care, Minimize Medications, Initiate Comfort Feeding, No Artificial Feeding, No Blood Transfusions, No Hemodialysis, No IV Antibiotics, No IV Fluids, No Lab Draws, No Surgical Procedures and No Tracheostomy  Code Status:    Code Status Orders        Start     Ordered   05/25/16 1317  Do not attempt resuscitation (DNR)  Continuous    Question Answer Comment  In the event of cardiac or respiratory ARREST Do not call a "code blue"   In the event of cardiac or respiratory ARREST Do not perform Intubation, CPR, defibrillation or ACLS   In the event of cardiac or respiratory ARREST Use medication by any route, position, wound care, and other  measures to relive pain and suffering. May use oxygen, suction and manual treatment of airway obstruction as needed for comfort.      05/25/16 1324    Code Status History    Date Active Date Inactive Code Status Order ID Comments User Context   05/25/2016 10:18 AM 05/25/2016  1:24 PM DNR [id number] KEarlie Counts NP Inpatient   05/24/2016  8:47 AM 05/25/2016 10:18 AM DNR [id number] RRoney Jaffe MD Inpatient   05/18/2016  6:58 AM 05/24/2016  8:47 AM Full Code [id number] CEdwin Dada MD ED   05/15/2016  1:10 AM 05/16/2016  4:01 PM Full Code [id number] CEdwin Dada MD Inpatient   02/05/2015  2:09 AM 02/05/2015  7:19 PM Full Code [id number] AMerton Border MD Inpatient   02/01/2015  6:08 PM 02/03/2015  8:31 PM Full Code [id number] TTroy Sine MD Inpatient   02/01/2015  5:09 AM 02/01/2015  6:08 PM Full Code [id number] JEtta Quill DO ED   06/23/2012 12:09 PM 06/26/2012  7:31 PM Full Code [id number] SAnnita Brod MD Inpatient   06/17/2012  5:52 PM 06/19/2012  6:21 PM Full Code [id number] AZenon MayoRFlensburg RN Inpatient   04/24/2012 11:57 PM 04/29/2012  6:04 PM Full Code [id number] Charito JLurline Hare RN Inpatient       Prognosis:   Hours - Days  Discharge Planning:  Anticipated Hospital Death  Care plan was discussed with patient, patient's family, and patient's RN.  Thank you for allowing the Palliative Medicine Team to assist in the  care of this patient.   Time In: 0830 Time Out: 1000 Total Time 99 min Prolonged Time Billed No      Greater than 50%  of this time was spent counseling and coordinating care related to the above assessment and plan.  Mariana Kaufman, AGNP-C Palliative Medicine  Please contact Palliative Medicine Team phone at [phone] for questions and concerns.

## 2016-05-25 NOTE — Progress Notes (Signed)
NP updated family regarding goals of care. Family would like comfort measures and for pt to be transferred to [facility]. Bed available and family has signed consent to be transferred to [facility]. Dobutamine stopped and fentanyl started. Pt is resting comfortably at this time.

## 2016-05-25 NOTE — Progress Notes (Signed)
ADVANCED HF ROUNDING NOTE Subjective:   Remains on dobutamine 7.5 and off norepinephrine. Had CVVHD Saturday, no transfer further..   She discussed situation with Dr. Jonnie Finner today. Now DNR. Wants to go home. Palliative Care meeting yesterday -- Plan is d/c to [facility] w/ Hospice - just trying to see if her son (in prison) can come see her.  Was able to call this AM, but there seems to be a disconnect btw the family's impression of his level of security & [place] who indicated [employer]. Currently the past year is trying to mediate this.  Plan would be hopefully to discharge to [facility] today pending further evaluation by palliative care. Would not be discharged on to [facility].   Objective:   Vital Signs : Vitals:   05/25/16 0700 05/25/16 0800 05/25/16 0900 05/25/16 1000  BP:  (!) 71/52 (!) 80/58 (!) 73/41  Pulse:   98   Resp: _0 Temp:  98.2 F (36.8 C)    TempSrc:  Axillary    SpO2:  100% 99%   Weight:        Intake/Output from previous day:  Intake/Output Summary (Last 24 hours) at 05/25/16 1048 Last data filed at 05/25/16 1000  Gross per 24 hour  Intake           115.53 ml  Output                0 ml  Net           115.53 ml    I/O since admission: -751  Wt Readings from Last 3 Encounters:  05/25/16 78.3 kg (172 lb 9.9 oz)  05/15/16 79.9 kg (176 lb 2.4 oz)  10/31/15 68.9 kg (152 lb)    Medications: . hydrocerin   Topical BID  . insulin aspart  0-5 Units Subcutaneous QHS  . insulin aspart  0-9 Units Subcutaneous TID WC  . methocarbamol  500 mg Oral BID  . midodrine  10 mg Oral TID WC  . pantoprazole (PROTONIX) IV  40 mg Intravenous Q12H  . sevelamer carbonate  2,400 mg Oral TID WC  . sodium chloride  250 mL Intravenous Once  . sodium chloride  250 mL Intravenous Once  . sodium chloride flush  10-40 mL Intracatheter Q12H  . Warfarin - Pharmacist Dosing Inpatient   Does not apply q1800    . DOBUTamine 5 mcg/kg/min (05/25/16 0800)     Physical Exam:  General appearance: Lying in bed, Very lethargic. Barely acknowledges my presence. Does not communicate or respond properly. Neck: no adenopathy, supple, symmetrical, trachea midline and thyroid not enlarged, symmetric, no tenderness/mass/nodules Lungs: clear anteriorly; but coarse upper respiratory breath sounds noted Heart: RRR 1/6 sem; no rub Abdomen: obese soft, non-tender; bowel sounds normal; no masses,  no organomegaly Extremities: bilateral AKA Neurologic: Grossly normal   Rate: 90s  Rhythm: normal sinus rhythm  Lab Results:   Recent Labs  05/23/16 1359 05/25/16 0335  NA 126* 130*  K 3.7 4.5  CL 91* 92*  CO2 17* 23  GLUCOSE 248* 122*  BUN 42* 43*  CREATININE 7.98* 6.54*  CALCIUM 7.3* 7.7*  PHOS 4.6  --     Hepatic Function Latest Ref Rng & Units 05/25/2016 05/23/2016 05/22/2016  Total Protein 6.5 - 8.1 g/dL 6.4(L) - 7.0  Albumin 3.5 - 5.0 g/dL 2.7(L) 2.8(L) 3.0(L)  AST 15 - 41 U/L 54(H) - 61(H)  ALT 14 - 54 U/L 32 - 33  Alk  Phosphatase 38 - 126 U/L 144(H) - 169(H)  Total Bilirubin 0.3 - 1.2 mg/dL 1.2 - 2.7(H)  Bilirubin, Direct 0.0 - 0.3 mg/dL - - -     Recent Labs  05/23/16 0509 05/23/16 2325 05/24/16 0500 05/25/16 0335  WBC 11.5*  --  11.5* 11.2*  HGB 9.9* 8.7* 8.7* 8.5*  HCT 28.6* 25.1* 25.9* 25.2*  MCV 83.4  --  83.8 84.0  PLT 175  --  158 147*     Recent Labs  05/22/16 1455 05/23/16 0510  TROPONINI 1.26* 1.41*    Lab Results  Component Value Date   TSH 1.214 05/21/2016   No results for input(s): HGBA1C in the last 72 hours.   Recent Labs  05/23/16 1359 05/25/16 0335  PROT  --  6.4*  ALBUMIN 2.8* 2.7*  AST  --  54*  ALT  --  32  ALKPHOS  --  144*  BILITOT  --  1.2    Recent Labs  05/25/16 0335  INR 3.54   BNP (last 3 results) No results for input(s): BNP in the last 8760 hours.  ProBNP (last 3 results) No results for input(s): PROBNP in the last 8760 hours.   Lipid Panel     Component Value  Date/Time   CHOL 121 (L) 09/10/2015 0902   TRIG 118 09/10/2015 0902   HDL 34 (L) 09/10/2015 0902   CHOLHDL 3.6 09/10/2015 0902   VLDL 24 09/10/2015 0902   LDLCALC 63 09/10/2015 0902   No longer on heparin.  Imaging:  No results found. ------------------------------------------------------------------- Study Conclusions Echocardiogram: EF 15 1120% with severely reduced function. Akinetic septal and apical myocardium. Moderately elevated pulmonary pressures. Moderately reduced RV. Systolic function. LV apical thrombus noted.  Assessment/Plan:   Principal Problem:   NSTEMI manifesting as nausea/fatigue Active Problems:   ESRD on dialysis   Insulin dependent diabetes mellitus with complications ([place])   Anemia   CAD S/P CFX DES 02/01/15   Essential hypertension   Hypotension, unspecified   Cardiogenic shock ([facility])   LV (left ventricular) mural thrombus ([place])   Encounter for anticoagulation discussion and counseling   Palliative care encounter   GI bleed  1. Cardiomyopathy;  EF in 2016 was nl; EF 15% yesterday with apical thrombus. 2. Cardiogenic shock;  --on dobutamine 7.5/ norepi started yesterday for SBP 70 now in 90s on norepi 3 --EF 15%  --Co-ox:  39.4 ->42.2 ->56.6 -> 64.7-> 71%-> pending 3. CAD/NSTEMI ; s/p PCI to Lcx 6/16 --unable to perform cardiac cath this admission as no access site 4. ESRD:   5. PVD; s/p B AKA 6. DM:  7. Anticoagulation:  --on warfarin with apical thrombus.  INR 2.8. INR rose quickly in setting of shock and poor hepatic function. Will continue ASA 81 with NSTEMI  8. Hyperlipidemia: on atorvastatin 40 mg  Notes over the weekend reviewed. Unfortunately she appears to be again nearing the end of her life with severe peripheral vascular disease, end-stage renal disease and now significant coronary disease but unable to have cardiac catheterization due to lack of access. Very poor long-term prognosis likely days without inotropes and  dialysis. Significant work done between Dr. Haroldine Laws and palliative care over the weekend, she is now DNR/DNI with plans to have everything turned off and to be discharged on hospice care.  Discharge now is currently pending establishing communication with department of corrections to potentially have her son visit. This is the biggest issue. Palliative care is can reassess this afternoon, and a bed  becomes available, would likely still try to discharge to begin placed this afternoon off of dobutamine.  LV apical thrombus noted, I don't think that she would be able to take warfarin, her INR is currently elevated regardless. I would not discharge on warfarin or endocrine relation.    Marylyn Ishihara, M.D., M.S. Interventional Cardiologist   Pager # [phone] Phone # [phone] [address] [place],  [address]  10:48 AM

## 2016-05-25 NOTE — Progress Notes (Signed)
   05/25/16 1320  Clinical Encounter Type  Visited With Family  Visit Type Spiritual support  Referral From Nurse  Spiritual Encounters  Spiritual Needs Prayer;Emotional;Grief support  Stress Factors  Patient Stress Factors Not reviewed  Family Stress Factors Loss of control;Family relationships;Major life changes  Paged to 2h waiting area. Pt's sister distraught. Offered prayer and emotional support. Grieving sister was escorted outside by family for calming.

## 2016-05-25 NOTE — Discharge Summary (Signed)
Discharge Summary    Patient ID: [name],  MRN: [id number], DOB/AGE: 09/19/1970 45 y.o.  Admit date: 05/17/2016 Discharge date: 05/25/2016  Primary Care Provider: Billee Cashing Primary Cardiologist: Dr. Tresa Endo    Discharge Diagnoses    Principal Problem:   NSTEMI manifesting as nausea/fatigue Active Problems:   ESRD on dialysis   Insulin dependent diabetes mellitus with complications (HCC)   Anemia   CAD S/P CFX DES 02/01/15   Essential hypertension   Hypotension, unspecified   Cardiogenic shock (HCC)   LV (left ventricular) mural thrombus (HCC)   Encounter for anticoagulation discussion and counseling   Palliative care encounter   Terminal care   Palliative care by specialist   Allergies Allergies  Allergen Reactions  . Percocet [Oxycodone-Acetaminophen] Other (See Comments)    GI UPSET  . Flagyl [Metronidazole] Other (See Comments)  . Soap Itching    IVORY SOAP  . Tramadol Itching     History of Present Illness     Ms. Keil is a 45 -year-old female with a hx of peripheral vascular disease s/p bilateral AKAs, ESRD on HD, essential hypertension, insulin-dependent diabetes with complications, CAD s/p stenting who presented to [facility] on 05/18/16 with nausea and decreased appetite .   In June 2016 she was admitted with nausea vomiting and increasing dyspnea with positive troponins.  Cardiac catheterization revealed 30-40% proximal circumflex stenosis followed by 99% proximal to mid stenosis prior to sharp and in a vessel.  She underwent PCI to the subtotal 99% stenosis with insertion of 2.515 mm Xience Alpine DES stent postdilated 2.71 mm.  A subsequent echocardiography revealed normal ejection fraction.   She had been doing fairly well until 9/13 when she experienced nausea, vomiting and diaphoresis.  She was admitted briefly in observation status and underwent dialysis and was discharged on 9/16.  She re-presented 05/18/16 with increasing fatigue without  further nausea and vomiting.  Her symptoms were reminiscent of symptoms from June 2016 when she had suffered her non-ST segment MI.  She was admitted.  Troponin was elevated at 4.31. Cardiology consultation was requested in light of her positive cardiac markers.  Hospital Course     Consultants: IM, nephrology, palliative care, PCCM  1. Cardiomyopathy;  EF in 2016 was nl; EF 15% 05/20/16 with apical thrombus.  2. Cardiogenic shock: -- placed on dobutamine 7.5/ norepi for SBP 70s and dobutamine for coox in 40%s -- EF 15%  --Co-ox:  39.4 ->42.2 ->56.6 -> 64.7-> 71%  3. CAD/NSTEMI ; s/p PCI to Lcx 6/16 --unable to perform cardiac cath this admission as no access site  4. ESRD: At this point due to hypotension she is unable to continue with dialysis  5. PVD; s/p B AKA  6. DM: was placed on SSI  7. Apical thrombus: she was placed on warfarin with apical thrombus. INR rose quickly in setting of shock and poor hepatic function.   8. Hyperlipidemia: was treated with atorva  DISPO: Notes over the weekend reviewed. Unfortunately she appears to be again nearing the end of her life with severe peripheral vascular disease, end-stage renal disease and now significant coronary disease but unable to have cardiac catheterization due to lack of access. Very poor long-term prognosis likely days without inotropes and dialysis. Significant work done between Dr. Gala Romney and palliative care over the weekend, she is now DNR/DNI with plans to have everything turned off and to be discharged on hospice care.  Discharge was postponed to establish communication with department of  corrections to potentially have her son visit. They would not let him out of jail to visit. Palliative care saw and started fentanyl drip at 15mcg/hr with 10 mcg bolus PRN pain or SOB (fentanyl preferred d/t pt with renal failure). Her only real complaint is itching. Benadryl and hydroxyzine can be administered. All life prolonging meds  have been discontinued.She will go to [facility] and per palliative care NP, will probably pass within the next 24-48 hours.    The patient has had a complicated hospital course and will be discharged to [facility] place for comfort care only. Discharge medications are listed below.  _____________  Discharge Vitals Blood pressure (!) 76/45, pulse 93, temperature 98.3 F (36.8 C), temperature source Axillary, resp. rate (!) 21, weight 172 lb 9.9 oz (78.3 kg), last menstrual period 05/14/2005, SpO2 93 %.  Filed Weights   05/23/16 1330 05/24/16 0300 05/25/16 0500  Weight: 182 lb 1.6 oz (82.6 kg) 171 lb 11.8 oz (77.9 kg) 172 lb 9.9 oz (78.3 kg)    Labs & Radiologic Studies     CBC  Recent Labs  05/24/16 0500 05/25/16 0335  WBC 11.5* 11.2*  HGB 8.7* 8.5*  HCT 25.9* 25.2*  MCV 83.8 84.0  PLT 158 147*   Basic Metabolic Panel  Recent Labs  05/23/16 1359 05/25/16 0335  NA 126* 130*  K 3.7 4.5  CL 91* 92*  CO2 17* 23  GLUCOSE 248* 122*  BUN 42* 43*  CREATININE 7.98* 6.54*  CALCIUM 7.3* 7.7*  PHOS 4.6  --    Liver Function Tests  Recent Labs  05/23/16 1359 05/25/16 0335  AST  --  54*  ALT  --  32  ALKPHOS  --  144*  BILITOT  --  1.2  PROT  --  6.4*  ALBUMIN 2.8* 2.7*   No results for input(s): LIPASE, AMYLASE in the last 72 hours. Cardiac Enzymes  Recent Labs  05/23/16 0510  TROPONINI 1.41*   BNP Invalid input(s): POCBNP D-Dimer No results for input(s): DDIMER in the last 72 hours. Hemoglobin A1C No results for input(s): HGBA1C in the last 72 hours. Fasting Lipid Panel No results for input(s): CHOL, HDL, LDLCALC, TRIG, CHOLHDL, LDLDIRECT in the last 72 hours. Thyroid Function Tests No results for input(s): TSH, T4TOTAL, T3FREE, THYROIDAB in the last 72 hours.  Invalid input(s): FREET3  Dg Chest Port 1 View  Result Date: 05/20/2016 CLINICAL DATA:  Central line placement EXAM: PORTABLE CHEST 1 VIEW COMPARISON:  05/17/2016 FINDINGS: Left internal  jugular central line has its tip in the SVC at the azygos level. No pneumothorax. The heart is enlarged. There is pulmonary venous hypertension. There is patchy density at the lung bases left worse than right that could be atelectasis or basilar pneumonia. No effusions. IMPRESSION: Central line tip in the SVC at the azygos level.  No pneumothorax. Probable fluid overload/interstitial edema. Patchy density at the lung bases left more than right could be atelectasis or pneumonia. Electronically Signed   By: Paulina Fusi M.D.   On: 05/20/2016 13:00   Dg Chest Portable 1 View  Result Date: 05/14/2016 CLINICAL DATA:  Weakness, nausea and vomiting for 2 days. EXAM: PORTABLE CHEST 1 VIEW COMPARISON:  09/08/2015 FINDINGS: Unchanged moderate cardiomegaly. The lungs are clear. The pulmonary vasculature is normal. There is no large effusion. IMPRESSION: Stable cardiomegaly.  No consolidation or effusion. Electronically Signed   By: Ellery Plunk M.D.   On: 05/14/2016 22:13   Dg Abd Acute W/chest  Result  Date: 05/18/2016 CLINICAL DATA:  Acute onset of upper back pain and vomiting. Initial encounter. EXAM: DG ABDOMEN ACUTE W/ 1V CHEST COMPARISON:  Chest radiograph performed 05/14/2016, and CT of the abdomen and pelvis from 09/08/2015 FINDINGS: The lungs are well-aerated. Minimal bibasilar atelectasis is noted. There is no evidence of pleural effusion or pneumothorax. The cardiomediastinal silhouette is enlarged. The visualized bowel gas pattern is unremarkable. Scattered stool and air are seen within the colon; there is no evidence of small bowel dilatation to suggest obstruction. No free intra-abdominal air is identified on the provided upright view. No acute osseous abnormalities are seen; the sacroiliac joints are unremarkable in appearance. Small calcified fibroids are noted overlying the pelvis. Scattered vascular calcifications are seen. IMPRESSION: 1. Unremarkable bowel gas pattern; no free intra-abdominal air  seen. Small to moderate amount of stool noted in the colon. 2. Minimal bibasilar atelectasis noted.  Cardiomegaly. 3. Small calcified uterine fibroids again noted. 4. Scattered vascular calcifications seen. Electronically Signed   By: Roanna RaiderJeffery  Chang M.D.   On: 05/18/2016 01:08     Diagnostic Studies/Procedures    2D ECHO: 05/20/2016 LV EF: 15% -   20% Study Conclusions - Left ventricle: The cavity size was normal. Wall thickness was   normal. Systolic function was moderately to severely reduced. The   estimated ejection fraction was in the range of 15% to 20%.   Akinesis of the mid-apicalanteroseptal and apical myocardium. - Mitral valve: Moderately calcified annulus. Mildly thickened   leaflets . There was mild regurgitation. - Right ventricle: The cavity size was mildly dilated. Systolic   function was moderately reduced. - Pulmonary arteries: Systolic pressure was moderately increased.   PA peak pressure: 42 mm Hg (S). _____________    Disposition   [facility].  Follow-up Plans & Appointments    NONE   Discharge Medications     Medication List    STOP taking these medications   amLODipine 10 MG tablet Commonly known as:  NORVASC   aspirin 81 MG chewable tablet   atorvastatin 40 MG tablet Commonly known as:  LIPITOR   cetirizine 10 MG tablet Commonly known as:  ZYRTEC   cinacalcet 30 MG tablet Commonly known as:  SENSIPAR   HYDROcodone-acetaminophen 5-325 MG tablet Commonly known as:  NORCO/VICODIN   insulin aspart protamine- aspart (70-30) 100 UNIT/ML injection Commonly known as:  NOVOLOG MIX 70/30   methocarbamol 500 MG tablet Commonly known as:  ROBAXIN   metoprolol 50 MG tablet Commonly known as:  LOPRESSOR   midodrine 10 MG tablet Commonly known as:  PROAMATINE   NOVOLIN 70/30 (70-30) 100 UNIT/ML injection Generic drug:  insulin NPH-regular Human   sevelamer carbonate 800 MG  tablet Commonly known as:  RENVELA   ticagrelor 90 MG Tabs tablet Commonly known as:  BRILINTA     TAKE these medications   acetaminophen 325 MG tablet Commonly known as:  TYLENOL Take 2 tablets (650 mg total) by mouth every 6 (six) hours as needed for mild pain (or Fever >/= 101).   diphenhydrAMINE 25 mg capsule Commonly known as:  BENADRYL Take 1 capsule (25 mg total) by mouth every 8 (eight) hours as needed for itching.   fentaNYL 2,500 mcg in sodium chloride 0.9 % 200 mL Inject 10 mcg/hr into the vein continuous.   glycopyrrolate 1 MG tablet Commonly known as:  ROBINUL Take 1 tablet (1 mg total) by mouth every 4 (four) hours as needed (excessive  secretions).   haloperidol 0.5 MG tablet Commonly known as:  HALDOL Take 1 tablet (0.5 mg total) by mouth every 4 (four) hours as needed for agitation (or delirium).   hydrOXYzine 25 MG tablet Commonly known as:  ATARAX/VISTARIL Take 1 tablet (25 mg total) by mouth 3 (three) times daily as needed for itching.   metoCLOPramide 5 MG tablet Commonly known as:  REGLAN Take 5 mg by mouth 3 (three) times daily before meals.   ondansetron 4 MG/2ML Soln injection Commonly known as:  ZOFRAN Inject 2 mLs (4 mg total) into the vein every 6 (six) hours as needed for nausea.         Outstanding Labs/Studies   NONE  Duration of Discharge Encounter   Greater than 30 minutes including physician time.  Signed, Cline Crock PA-C 05/25/2016, 3:47 PM   Notes over the weekend reviewed. Unfortunately she appears to be again nearing the end of her life with severe peripheral vascular disease, end-stage renal disease and now significant coronary disease but unable to have cardiac catheterization due to lack of access. Very poor long-term prognosis likely days without inotropes and dialysis. Significant work done between Dr. Gala Romney and palliative care over the weekend, she is now DNR/DNI with plans to have everything turned off and  to be discharged on hospice care.   LV apical thrombus noted, I don't think that she would be able to take warfarin, her INR is currently elevated regardless. I would not discharge on warfarin or endocrine relation.    Robbie Lis, M.D., M.S. Interventional Cardiologist   Pager # [phone] Phone # [phone] [address] [place], [place] [address]

## 2016-05-31 DEATH — deceased

## 2017-01-28 IMAGING — CR DG CHEST 1V PORT
1 series · 1 of 1 positions shown · non-contrast
Comparison: 09/08/2015

CLINICAL DATA: Weakness, nausea and vomiting for 2 days.

EXAM:
PORTABLE CHEST 1 VIEW

[AP]
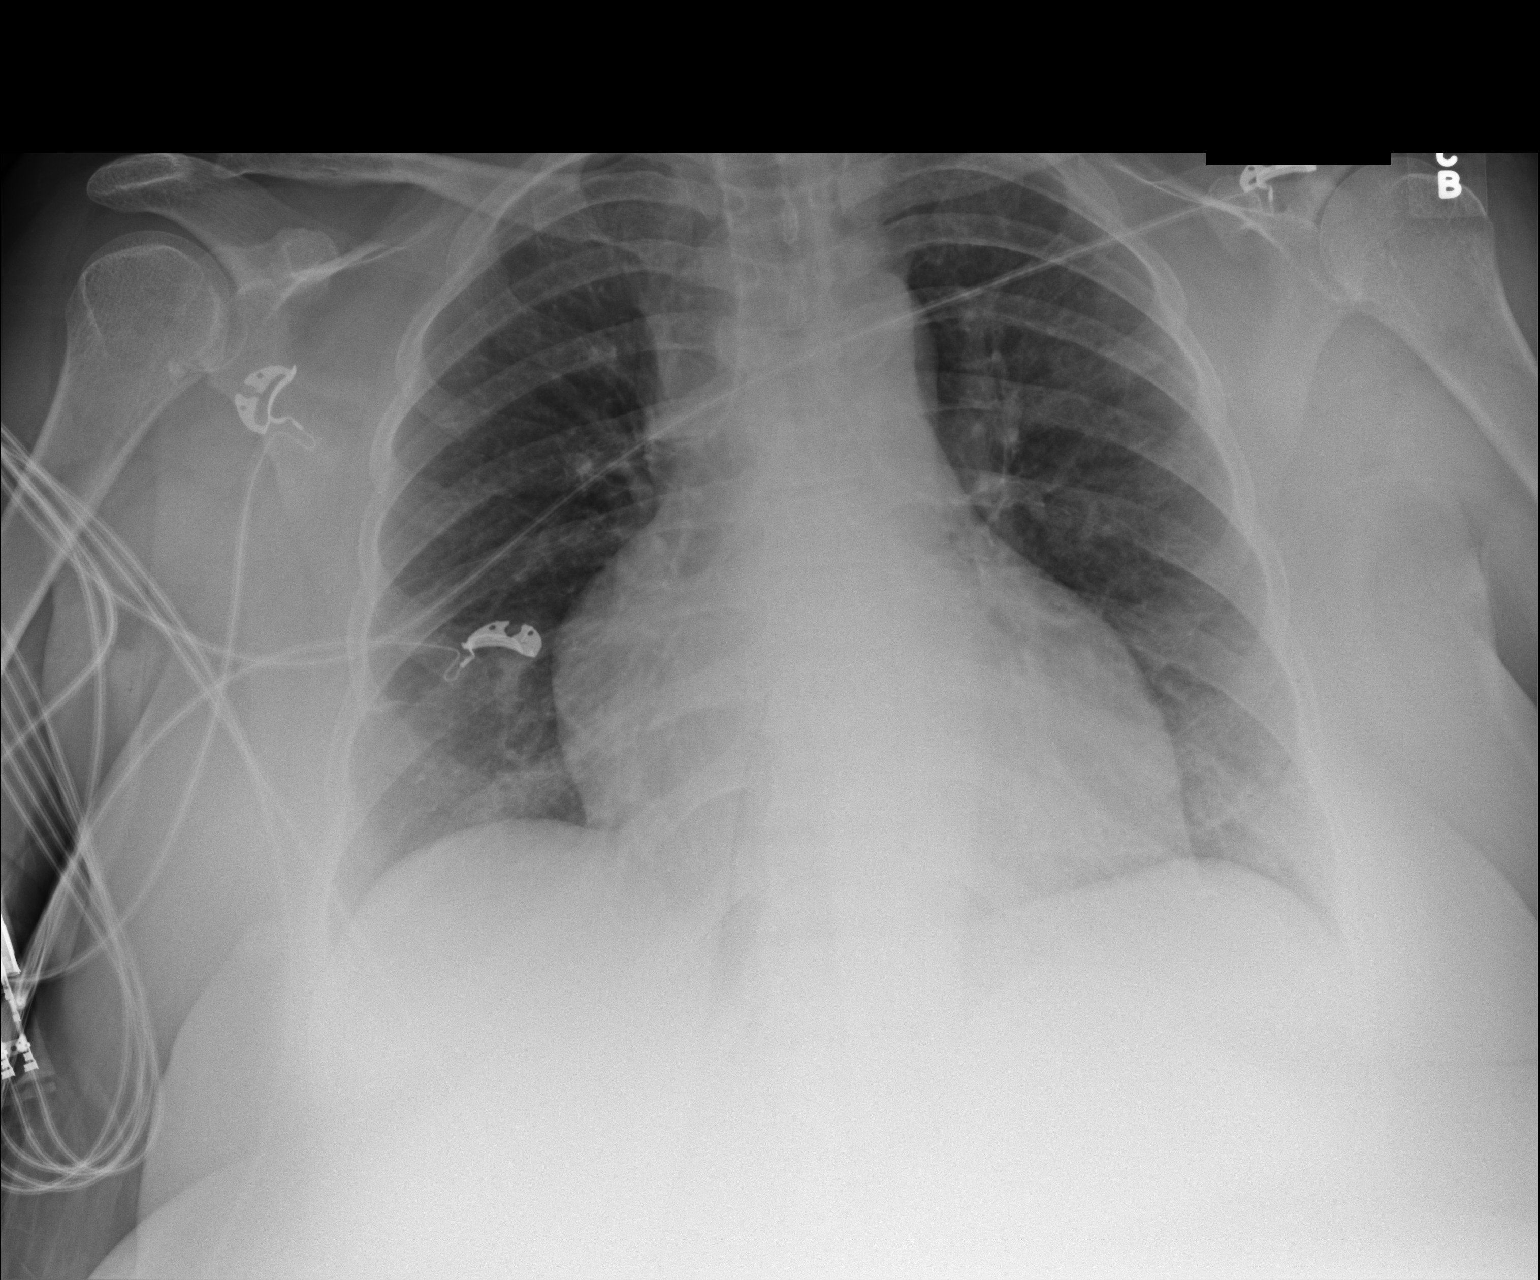

[1 of 1 positions shown; findings below may reference images not displayed]

FINDINGS: Unchanged moderate cardiomegaly. The lungs are clear. The pulmonary
vasculature is normal. There is no large effusion.
IMPRESSION: Stable cardiomegaly.  No consolidation or effusion.
